# Patient Record
Sex: Female | Born: 1935 | Race: Black or African American | Hispanic: No | Smoking: Former smoker
Health system: Southern US, Community
[De-identification: ages and names within clinical notes are randomized; demographics above are authoritative.]

## PROBLEM LIST (undated history)

## (undated) DIAGNOSIS — N189 Chronic kidney disease, unspecified: Secondary | ICD-10-CM

## (undated) DIAGNOSIS — E785 Hyperlipidemia, unspecified: Secondary | ICD-10-CM

## (undated) DIAGNOSIS — I38 Endocarditis, valve unspecified: Secondary | ICD-10-CM

## (undated) DIAGNOSIS — S62109A Fracture of unspecified carpal bone, unspecified wrist, initial encounter for closed fracture: Secondary | ICD-10-CM

## (undated) DIAGNOSIS — M1990 Unspecified osteoarthritis, unspecified site: Secondary | ICD-10-CM

## (undated) DIAGNOSIS — M109 Gout, unspecified: Secondary | ICD-10-CM

## (undated) DIAGNOSIS — D509 Iron deficiency anemia, unspecified: Secondary | ICD-10-CM

## (undated) DIAGNOSIS — I10 Essential (primary) hypertension: Secondary | ICD-10-CM

## (undated) DIAGNOSIS — I639 Cerebral infarction, unspecified: Secondary | ICD-10-CM

## (undated) DIAGNOSIS — K5790 Diverticulosis of intestine, part unspecified, without perforation or abscess without bleeding: Secondary | ICD-10-CM

## (undated) DIAGNOSIS — I5042 Chronic combined systolic (congestive) and diastolic (congestive) heart failure: Secondary | ICD-10-CM

## (undated) DIAGNOSIS — I480 Paroxysmal atrial fibrillation: Secondary | ICD-10-CM

## (undated) DIAGNOSIS — I214 Non-ST elevation (NSTEMI) myocardial infarction: Secondary | ICD-10-CM

## (undated) DIAGNOSIS — M7120 Synovial cyst of popliteal space [Baker], unspecified knee: Secondary | ICD-10-CM

## (undated) HISTORY — DX: Chronic kidney disease, unspecified: N18.9

## (undated) HISTORY — DX: Hyperlipidemia, unspecified: E78.5

## (undated) HISTORY — DX: Synovial cyst of popliteal space (Baker), unspecified knee: M71.20

## (undated) HISTORY — DX: Diverticulosis of intestine, part unspecified, without perforation or abscess without bleeding: K57.90

## (undated) HISTORY — DX: Essential (primary) hypertension: I10

## (undated) HISTORY — DX: Unspecified osteoarthritis, unspecified site: M19.90

## (undated) HISTORY — DX: Endocarditis, valve unspecified: I38

## (undated) HISTORY — DX: Paroxysmal atrial fibrillation: I48.0

## (undated) HISTORY — DX: Gout, unspecified: M10.9

## (undated) HISTORY — DX: Fracture of unspecified carpal bone, unspecified wrist, initial encounter for closed fracture: S62.109A

## (undated) HISTORY — DX: Iron deficiency anemia, unspecified: D50.9

---
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
## 1992-03-28 HISTORY — PX: TOTAL HIP ARTHROPLASTY: SHX124

## 2000-08-08 ENCOUNTER — Other Ambulatory Visit: Admission: RE | Admit: 2000-08-08 | Discharge: 2000-08-08 | Payer: Self-pay | Admitting: Family Medicine

## 2001-05-26 ENCOUNTER — Encounter: Payer: Self-pay | Admitting: Ophthalmology

## 2001-05-26 ENCOUNTER — Emergency Department (HOSPITAL_COMMUNITY): Admission: EM | Admit: 2001-05-26 | Discharge: 2001-05-26 | Payer: Self-pay | Admitting: Internal Medicine

## 2001-07-19 ENCOUNTER — Encounter (HOSPITAL_COMMUNITY): Admission: RE | Admit: 2001-07-19 | Discharge: 2001-08-18 | Payer: Self-pay | Admitting: Orthopedic Surgery

## 2001-09-10 ENCOUNTER — Inpatient Hospital Stay (HOSPITAL_COMMUNITY): Admission: EM | Admit: 2001-09-10 | Discharge: 2001-09-14 | Payer: Self-pay | Admitting: Emergency Medicine

## 2002-03-08 ENCOUNTER — Ambulatory Visit (HOSPITAL_COMMUNITY): Admission: RE | Admit: 2002-03-08 | Discharge: 2002-03-08 | Payer: Self-pay | Admitting: Family Medicine

## 2002-03-08 ENCOUNTER — Encounter: Payer: Self-pay | Admitting: Family Medicine

## 2002-12-16 ENCOUNTER — Ambulatory Visit (HOSPITAL_COMMUNITY): Admission: RE | Admit: 2002-12-16 | Discharge: 2002-12-16 | Payer: Self-pay | Admitting: Family Medicine

## 2002-12-16 ENCOUNTER — Encounter: Payer: Self-pay | Admitting: Family Medicine

## 2003-01-17 ENCOUNTER — Ambulatory Visit (HOSPITAL_COMMUNITY): Admission: RE | Admit: 2003-01-17 | Discharge: 2003-01-17 | Payer: Self-pay | Admitting: Family Medicine

## 2004-02-05 ENCOUNTER — Ambulatory Visit: Payer: Self-pay | Admitting: Family Medicine

## 2004-03-16 ENCOUNTER — Ambulatory Visit: Payer: Self-pay | Admitting: Family Medicine

## 2004-03-30 ENCOUNTER — Ambulatory Visit: Admission: RE | Admit: 2004-03-30 | Discharge: 2004-03-30 | Payer: Self-pay | Admitting: Family Medicine

## 2004-05-12 ENCOUNTER — Ambulatory Visit: Payer: Self-pay | Admitting: Family Medicine

## 2004-05-18 ENCOUNTER — Ambulatory Visit (HOSPITAL_COMMUNITY): Admission: RE | Admit: 2004-05-18 | Discharge: 2004-05-18 | Payer: Self-pay | Admitting: Family Medicine

## 2004-08-16 ENCOUNTER — Ambulatory Visit: Payer: Self-pay | Admitting: Family Medicine

## 2004-09-13 ENCOUNTER — Ambulatory Visit: Payer: Self-pay | Admitting: Family Medicine

## 2004-12-16 ENCOUNTER — Ambulatory Visit: Payer: Self-pay | Admitting: Family Medicine

## 2005-02-08 ENCOUNTER — Ambulatory Visit: Payer: Self-pay | Admitting: Family Medicine

## 2005-03-16 ENCOUNTER — Ambulatory Visit: Payer: Self-pay | Admitting: Family Medicine

## 2005-03-22 ENCOUNTER — Ambulatory Visit: Payer: Self-pay | Admitting: Family Medicine

## 2005-05-03 ENCOUNTER — Ambulatory Visit: Payer: Self-pay | Admitting: Family Medicine

## 2005-05-03 ENCOUNTER — Ambulatory Visit (HOSPITAL_COMMUNITY): Admission: RE | Admit: 2005-05-03 | Discharge: 2005-05-03 | Payer: Self-pay | Admitting: Family Medicine

## 2005-05-10 ENCOUNTER — Ambulatory Visit: Payer: Self-pay | Admitting: *Deleted

## 2005-05-11 ENCOUNTER — Ambulatory Visit: Payer: Self-pay | Admitting: *Deleted

## 2005-05-11 ENCOUNTER — Ambulatory Visit (HOSPITAL_COMMUNITY): Admission: RE | Admit: 2005-05-11 | Discharge: 2005-05-11 | Payer: Self-pay | Admitting: *Deleted

## 2005-05-17 ENCOUNTER — Ambulatory Visit: Payer: Self-pay | Admitting: *Deleted

## 2005-05-20 ENCOUNTER — Ambulatory Visit: Payer: Self-pay | Admitting: Cardiology

## 2005-05-24 ENCOUNTER — Ambulatory Visit: Payer: Self-pay | Admitting: Family Medicine

## 2005-05-24 ENCOUNTER — Ambulatory Visit: Payer: Self-pay | Admitting: *Deleted

## 2005-05-31 ENCOUNTER — Ambulatory Visit: Payer: Self-pay | Admitting: *Deleted

## 2005-06-08 ENCOUNTER — Ambulatory Visit: Payer: Self-pay | Admitting: Cardiology

## 2005-06-13 ENCOUNTER — Ambulatory Visit: Payer: Self-pay | Admitting: Family Medicine

## 2005-07-26 ENCOUNTER — Ambulatory Visit: Payer: Self-pay | Admitting: Family Medicine

## 2006-01-03 ENCOUNTER — Ambulatory Visit: Payer: Self-pay | Admitting: Family Medicine

## 2006-01-06 ENCOUNTER — Encounter (HOSPITAL_COMMUNITY): Admission: RE | Admit: 2006-01-06 | Discharge: 2006-02-05 | Payer: Self-pay | Admitting: Family Medicine

## 2006-01-09 ENCOUNTER — Ambulatory Visit (HOSPITAL_COMMUNITY): Payer: Self-pay | Admitting: Family Medicine

## 2006-01-13 ENCOUNTER — Ambulatory Visit: Payer: Self-pay | Admitting: Cardiology

## 2006-01-16 ENCOUNTER — Ambulatory Visit: Payer: Self-pay | Admitting: Family Medicine

## 2006-01-25 ENCOUNTER — Ambulatory Visit: Payer: Self-pay | Admitting: Family Medicine

## 2006-01-31 ENCOUNTER — Ambulatory Visit: Payer: Self-pay | Admitting: Gastroenterology

## 2006-02-06 ENCOUNTER — Ambulatory Visit: Payer: Self-pay | Admitting: Gastroenterology

## 2006-02-06 ENCOUNTER — Ambulatory Visit (HOSPITAL_COMMUNITY): Admission: RE | Admit: 2006-02-06 | Discharge: 2006-02-06 | Payer: Self-pay | Admitting: Gastroenterology

## 2006-02-06 ENCOUNTER — Encounter (INDEPENDENT_AMBULATORY_CARE_PROVIDER_SITE_OTHER): Payer: Self-pay | Admitting: Specialist

## 2006-02-08 ENCOUNTER — Ambulatory Visit: Payer: Self-pay | Admitting: Gastroenterology

## 2006-02-08 ENCOUNTER — Ambulatory Visit (HOSPITAL_COMMUNITY): Admission: RE | Admit: 2006-02-08 | Discharge: 2006-02-08 | Payer: Self-pay | Admitting: Gastroenterology

## 2006-02-20 ENCOUNTER — Ambulatory Visit: Payer: Self-pay | Admitting: Family Medicine

## 2006-02-23 ENCOUNTER — Ambulatory Visit (HOSPITAL_COMMUNITY): Admission: RE | Admit: 2006-02-23 | Discharge: 2006-02-23 | Payer: Self-pay | Admitting: Family Medicine

## 2006-03-16 ENCOUNTER — Ambulatory Visit: Payer: Self-pay | Admitting: Gastroenterology

## 2006-03-28 DIAGNOSIS — S62109A Fracture of unspecified carpal bone, unspecified wrist, initial encounter for closed fracture: Secondary | ICD-10-CM

## 2006-03-28 HISTORY — DX: Fracture of unspecified carpal bone, unspecified wrist, initial encounter for closed fracture: S62.109A

## 2006-04-07 ENCOUNTER — Ambulatory Visit: Payer: Self-pay | Admitting: Family Medicine

## 2006-05-08 ENCOUNTER — Ambulatory Visit: Payer: Self-pay | Admitting: Gastroenterology

## 2006-05-10 ENCOUNTER — Ambulatory Visit: Payer: Self-pay | Admitting: Cardiology

## 2006-05-23 ENCOUNTER — Other Ambulatory Visit: Admission: RE | Admit: 2006-05-23 | Discharge: 2006-05-23 | Payer: Self-pay | Admitting: Family Medicine

## 2006-05-23 ENCOUNTER — Ambulatory Visit: Payer: Self-pay | Admitting: Family Medicine

## 2006-05-23 LAB — CONVERTED CEMR LAB
ALT: 8 units/L (ref 0–35)
AST: 15 units/L (ref 0–37)
Alkaline Phosphatase: 59 units/L (ref 39–117)
BUN: 21 mg/dL (ref 6–23)
Basophils Absolute: 0.1 10*3/uL (ref 0.0–0.1)
Bilirubin, Direct: 0.1 mg/dL (ref 0.0–0.3)
CO2: 23 meq/L (ref 19–32)
Eosinophils Absolute: 0.1 10*3/uL (ref 0.0–0.7)
Eosinophils Relative: 1 % (ref 0–5)
HCT: 33.6 % — ABNORMAL LOW (ref 36.0–46.0)
HDL: 57 mg/dL (ref >39)
Hemoglobin: 10.8 g/dL — ABNORMAL LOW (ref 12.0–15.0)
Indirect Bilirubin: 0.6 mg/dL (ref 0.0–0.9)
LDL Cholesterol: 76 mg/dL (ref 0–99)
Neutro Abs: 3.1 10*3/uL (ref 1.7–7.7)
Pap Smear: NORMAL
Pap Smear: NORMAL
Platelets: 230 10*3/uL (ref 150–400)
RBC: 3.49 M/uL — ABNORMAL LOW (ref 3.87–5.11)
Total Protein: 7.2 g/dL (ref 6.0–8.3)
Triglycerides: 101 mg/dL (ref <150)
VLDL: 20 mg/dL (ref 0–40)
WBC: 5.9 10*3/uL (ref 4.0–10.5)

## 2006-07-18 ENCOUNTER — Ambulatory Visit: Payer: Self-pay | Admitting: Cardiology

## 2006-07-20 ENCOUNTER — Ambulatory Visit (HOSPITAL_COMMUNITY): Admission: RE | Admit: 2006-07-20 | Discharge: 2006-07-20 | Payer: Self-pay | Admitting: Cardiology

## 2006-07-28 ENCOUNTER — Ambulatory Visit: Payer: Self-pay | Admitting: Family Medicine

## 2006-08-23 ENCOUNTER — Ambulatory Visit: Payer: Self-pay | Admitting: Family Medicine

## 2006-08-25 ENCOUNTER — Ambulatory Visit (HOSPITAL_COMMUNITY): Admission: RE | Admit: 2006-08-25 | Discharge: 2006-08-25 | Payer: Self-pay | Admitting: Family Medicine

## 2006-09-20 ENCOUNTER — Ambulatory Visit: Payer: Self-pay | Admitting: Gastroenterology

## 2006-10-04 ENCOUNTER — Ambulatory Visit: Payer: Self-pay | Admitting: Family Medicine

## 2006-12-15 ENCOUNTER — Ambulatory Visit: Payer: Self-pay | Admitting: Family Medicine

## 2006-12-20 ENCOUNTER — Ambulatory Visit: Payer: Self-pay | Admitting: Family Medicine

## 2006-12-21 ENCOUNTER — Encounter: Payer: Self-pay | Admitting: Family Medicine

## 2006-12-21 LAB — CONVERTED CEMR LAB
ALT: 11 units/L (ref 0–35)
Basophils Absolute: 0.1 10*3/uL (ref 0.0–0.1)
Calcium: 9.8 mg/dL (ref 8.4–10.5)
Chloride: 104 meq/L (ref 96–112)
Cholesterol: 158 mg/dL (ref 0–200)
Eosinophils Absolute: 0 10*3/uL (ref 0.0–0.7)
Eosinophils Relative: 1 % (ref 0–5)
HCT: 33.4 % — ABNORMAL LOW (ref 36.0–46.0)
HDL: 64 mg/dL (ref >39)
LDL Cholesterol: 79 mg/dL (ref 0–99)
Lymphs Abs: 1.7 10*3/uL (ref 0.7–3.3)
MCV: 89.8 fL (ref 78.0–100.0)
Monocytes Absolute: 0.3 10*3/uL (ref 0.2–0.7)
Monocytes Relative: 5 % (ref 3–11)
Neutro Abs: 3.7 10*3/uL (ref 1.7–7.7)
Sodium: 138 meq/L (ref 135–145)
TSH: 0.568 microintl units/mL (ref 0.350–5.50)
WBC: 5.8 10*3/uL (ref 4.0–10.5)

## 2006-12-28 ENCOUNTER — Ambulatory Visit: Payer: Self-pay | Admitting: Family Medicine

## 2007-01-12 ENCOUNTER — Ambulatory Visit: Payer: Self-pay | Admitting: Cardiology

## 2007-02-05 ENCOUNTER — Ambulatory Visit: Payer: Self-pay | Admitting: Family Medicine

## 2007-03-27 ENCOUNTER — Ambulatory Visit: Payer: Self-pay | Admitting: Family Medicine

## 2007-04-06 ENCOUNTER — Encounter: Payer: Self-pay | Admitting: Family Medicine

## 2007-04-06 DIAGNOSIS — D509 Iron deficiency anemia, unspecified: Secondary | ICD-10-CM | POA: Insufficient documentation

## 2007-05-24 ENCOUNTER — Ambulatory Visit: Payer: Self-pay | Admitting: Family Medicine

## 2007-07-18 ENCOUNTER — Encounter: Payer: Self-pay | Admitting: Family Medicine

## 2007-07-19 ENCOUNTER — Ambulatory Visit: Payer: Self-pay | Admitting: Family Medicine

## 2007-07-20 ENCOUNTER — Encounter: Payer: Self-pay | Admitting: Family Medicine

## 2007-07-23 ENCOUNTER — Ambulatory Visit: Payer: Self-pay | Admitting: Family Medicine

## 2007-08-22 ENCOUNTER — Ambulatory Visit (HOSPITAL_COMMUNITY): Admission: RE | Admit: 2007-08-22 | Discharge: 2007-08-22 | Payer: Self-pay | Admitting: Family Medicine

## 2007-09-18 ENCOUNTER — Ambulatory Visit: Payer: Self-pay | Admitting: Family Medicine

## 2007-10-15 ENCOUNTER — Ambulatory Visit: Payer: Self-pay | Admitting: Family Medicine

## 2007-11-09 ENCOUNTER — Encounter: Payer: Self-pay | Admitting: Family Medicine

## 2007-11-09 ENCOUNTER — Ambulatory Visit: Payer: Self-pay | Admitting: Cardiology

## 2007-11-20 ENCOUNTER — Ambulatory Visit: Payer: Self-pay | Admitting: Family Medicine

## 2007-11-20 DIAGNOSIS — M129 Arthropathy, unspecified: Secondary | ICD-10-CM

## 2007-11-23 ENCOUNTER — Ambulatory Visit: Payer: Self-pay | Admitting: Family Medicine

## 2007-11-28 ENCOUNTER — Encounter: Payer: Self-pay | Admitting: Family Medicine

## 2007-12-24 ENCOUNTER — Encounter: Payer: Self-pay | Admitting: Family Medicine

## 2007-12-27 ENCOUNTER — Ambulatory Visit: Payer: Self-pay | Admitting: Family Medicine

## 2008-01-08 ENCOUNTER — Encounter: Payer: Self-pay | Admitting: Family Medicine

## 2008-01-10 ENCOUNTER — Encounter: Payer: Self-pay | Admitting: Family Medicine

## 2008-01-10 LAB — CONVERTED CEMR LAB
ALT: 11 units/L (ref 0–35)
AST: 16 units/L (ref 0–37)
Albumin: 4.2 g/dL (ref 3.5–5.2)
Calcium: 9.9 mg/dL (ref 8.4–10.5)
Chloride: 104 meq/L (ref 96–112)
Cholesterol: 177 mg/dL (ref 0–200)
LDL Cholesterol: 97 mg/dL (ref 0–99)
Potassium: 5.1 meq/L (ref 3.5–5.3)
Sodium: 141 meq/L (ref 135–145)
Total CHOL/HDL Ratio: 2.7
Total Protein: 7.8 g/dL (ref 6.0–8.3)
Triglycerides: 75 mg/dL (ref <150)

## 2008-01-15 ENCOUNTER — Encounter: Payer: Self-pay | Admitting: Family Medicine

## 2008-01-16 ENCOUNTER — Ambulatory Visit: Payer: Self-pay | Admitting: Family Medicine

## 2008-01-29 ENCOUNTER — Ambulatory Visit: Payer: Self-pay | Admitting: Family Medicine

## 2008-01-29 DIAGNOSIS — S0990XA Unspecified injury of head, initial encounter: Secondary | ICD-10-CM | POA: Insufficient documentation

## 2008-01-29 DIAGNOSIS — M25539 Pain in unspecified wrist: Secondary | ICD-10-CM

## 2008-01-30 ENCOUNTER — Ambulatory Visit (HOSPITAL_COMMUNITY): Admission: RE | Admit: 2008-01-30 | Discharge: 2008-01-30 | Payer: Self-pay | Admitting: Family Medicine

## 2008-02-07 ENCOUNTER — Encounter: Payer: Self-pay | Admitting: Family Medicine

## 2008-03-06 ENCOUNTER — Encounter: Payer: Self-pay | Admitting: Family Medicine

## 2008-03-10 ENCOUNTER — Telehealth: Payer: Self-pay | Admitting: Family Medicine

## 2008-03-13 ENCOUNTER — Encounter: Payer: Self-pay | Admitting: Family Medicine

## 2008-03-24 ENCOUNTER — Encounter: Payer: Self-pay | Admitting: Family Medicine

## 2008-03-31 ENCOUNTER — Encounter: Payer: Self-pay | Admitting: Family Medicine

## 2008-04-14 ENCOUNTER — Ambulatory Visit: Payer: Self-pay | Admitting: Family Medicine

## 2008-04-16 ENCOUNTER — Encounter: Payer: Self-pay | Admitting: Family Medicine

## 2008-04-25 ENCOUNTER — Encounter: Payer: Self-pay | Admitting: Family Medicine

## 2008-05-05 ENCOUNTER — Encounter: Payer: Self-pay | Admitting: Family Medicine

## 2008-05-07 ENCOUNTER — Encounter: Payer: Self-pay | Admitting: Family Medicine

## 2008-05-21 ENCOUNTER — Encounter: Payer: Self-pay | Admitting: Family Medicine

## 2008-05-23 ENCOUNTER — Ambulatory Visit: Payer: Self-pay | Admitting: Family Medicine

## 2008-05-23 DIAGNOSIS — R5381 Other malaise: Secondary | ICD-10-CM | POA: Insufficient documentation

## 2008-05-23 DIAGNOSIS — R5383 Other fatigue: Secondary | ICD-10-CM

## 2008-05-28 ENCOUNTER — Encounter: Payer: Self-pay | Admitting: Family Medicine

## 2008-05-30 ENCOUNTER — Encounter: Payer: Self-pay | Admitting: Family Medicine

## 2008-05-30 LAB — CONVERTED CEMR LAB
ALT: 11 units/L (ref 0–35)
AST: 17 units/L (ref 0–37)
Albumin: 3.9 g/dL (ref 3.5–5.2)
Eosinophils Relative: 1 % (ref 0–5)
HCT: 33 % — ABNORMAL LOW (ref 36.0–46.0)
Hemoglobin: 10.1 g/dL — ABNORMAL LOW (ref 12.0–15.0)
Indirect Bilirubin: 0.5 mg/dL (ref 0.0–0.9)
Lymphocytes Relative: 24 % (ref 12–46)
MCHC: 30.6 g/dL (ref 30.0–36.0)
Monocytes Relative: 5 % (ref 3–12)
Neutro Abs: 4.5 10*3/uL (ref 1.7–7.7)
Neutrophils Relative %: 68 % (ref 43–77)
RBC: 3.5 M/uL — ABNORMAL LOW (ref 3.87–5.11)
RDW: 13.1 % (ref 11.5–15.5)
Total Bilirubin: 0.6 mg/dL (ref 0.3–1.2)
Total CHOL/HDL Ratio: 2.6
Triglycerides: 71 mg/dL (ref <150)
VLDL: 14 mg/dL (ref 0–40)
WBC: 6.6 10*3/uL (ref 4.0–10.5)

## 2008-06-02 ENCOUNTER — Telehealth: Payer: Self-pay | Admitting: Family Medicine

## 2008-06-03 ENCOUNTER — Encounter: Payer: Self-pay | Admitting: Family Medicine

## 2008-09-15 ENCOUNTER — Encounter: Payer: Self-pay | Admitting: Family Medicine

## 2008-09-16 ENCOUNTER — Encounter: Payer: Self-pay | Admitting: Family Medicine

## 2008-10-16 ENCOUNTER — Ambulatory Visit: Payer: Self-pay | Admitting: Family Medicine

## 2008-10-16 DIAGNOSIS — M25569 Pain in unspecified knee: Secondary | ICD-10-CM

## 2008-10-22 ENCOUNTER — Ambulatory Visit (HOSPITAL_COMMUNITY): Admission: RE | Admit: 2008-10-22 | Discharge: 2008-10-22 | Payer: Self-pay | Admitting: Family Medicine

## 2008-10-27 ENCOUNTER — Encounter: Payer: Self-pay | Admitting: Family Medicine

## 2008-10-28 ENCOUNTER — Encounter: Payer: Self-pay | Admitting: Family Medicine

## 2008-11-17 ENCOUNTER — Encounter: Payer: Self-pay | Admitting: Family Medicine

## 2008-11-28 ENCOUNTER — Encounter: Payer: Self-pay | Admitting: Family Medicine

## 2008-12-12 ENCOUNTER — Encounter: Payer: Self-pay | Admitting: Family Medicine

## 2008-12-29 ENCOUNTER — Encounter: Payer: Self-pay | Admitting: Family Medicine

## 2008-12-31 ENCOUNTER — Ambulatory Visit: Payer: Self-pay | Admitting: Family Medicine

## 2009-01-07 ENCOUNTER — Encounter: Payer: Self-pay | Admitting: Family Medicine

## 2009-01-12 LAB — CONVERTED CEMR LAB
BUN: 26 mg/dL — ABNORMAL HIGH (ref 6–23)
Basophils Absolute: 0.1 10*3/uL (ref 0.0–0.1)
Calcium: 10.2 mg/dL (ref 8.4–10.5)
Chloride: 104 meq/L (ref 96–112)
Cholesterol: 186 mg/dL (ref 0–200)
Creatinine, Ser: 1.41 mg/dL — ABNORMAL HIGH (ref 0.40–1.20)
Eosinophils Absolute: 0.1 10*3/uL (ref 0.0–0.7)
Eosinophils Relative: 2 % (ref 0–5)
Glucose, Bld: 85 mg/dL (ref 70–99)
HCT: 32.1 % — ABNORMAL LOW (ref 36.0–46.0)
Indirect Bilirubin: 0.4 mg/dL (ref 0.0–0.9)
LDL Cholesterol: 105 mg/dL — ABNORMAL HIGH (ref 0–99)
Lymphocytes Relative: 30 % (ref 12–46)
MCHC: 31.2 g/dL (ref 30.0–36.0)
Monocytes Absolute: 0.5 10*3/uL (ref 0.1–1.0)
Monocytes Relative: 8 % (ref 3–12)
Neutrophils Relative %: 59 % (ref 43–77)
Platelets: 204 10*3/uL (ref 150–400)
RDW: 12.8 % (ref 11.5–15.5)
Total CHOL/HDL Ratio: 2.8
Triglycerides: 69 mg/dL (ref <150)
WBC: 5.9 10*3/uL (ref 4.0–10.5)

## 2009-01-14 ENCOUNTER — Ambulatory Visit: Payer: Self-pay | Admitting: Family Medicine

## 2009-01-14 DIAGNOSIS — L089 Local infection of the skin and subcutaneous tissue, unspecified: Secondary | ICD-10-CM | POA: Insufficient documentation

## 2009-01-14 DIAGNOSIS — M79609 Pain in unspecified limb: Secondary | ICD-10-CM

## 2009-01-15 LAB — CONVERTED CEMR LAB: Uric Acid, Serum: 9.3 mg/dL — ABNORMAL HIGH (ref 2.4–7.0)

## 2009-01-20 ENCOUNTER — Encounter (INDEPENDENT_AMBULATORY_CARE_PROVIDER_SITE_OTHER): Payer: Self-pay | Admitting: *Deleted

## 2009-01-20 ENCOUNTER — Ambulatory Visit: Payer: Self-pay | Admitting: Cardiology

## 2009-01-28 ENCOUNTER — Encounter (INDEPENDENT_AMBULATORY_CARE_PROVIDER_SITE_OTHER): Payer: Self-pay | Admitting: *Deleted

## 2009-01-28 LAB — CONVERTED CEMR LAB
OCCULT 1: NEGATIVE
OCCULT 2: NEGATIVE
OCCULT 3: NEGATIVE

## 2009-01-30 ENCOUNTER — Encounter (INDEPENDENT_AMBULATORY_CARE_PROVIDER_SITE_OTHER): Payer: Self-pay | Admitting: *Deleted

## 2009-02-02 LAB — CONVERTED CEMR LAB
OCCULT 1: NEGATIVE
OCCULT 2: NEGATIVE

## 2009-02-04 ENCOUNTER — Ambulatory Visit: Payer: Self-pay | Admitting: Family Medicine

## 2009-03-13 ENCOUNTER — Ambulatory Visit: Payer: Self-pay | Admitting: Family Medicine

## 2009-04-20 ENCOUNTER — Ambulatory Visit: Payer: Self-pay | Admitting: Family Medicine

## 2009-04-20 LAB — CONVERTED CEMR LAB
Creatinine, Ser: 1.45 mg/dL — ABNORMAL HIGH (ref 0.40–1.20)
Sodium: 137 meq/L (ref 135–145)

## 2009-05-11 ENCOUNTER — Ambulatory Visit: Payer: Self-pay | Admitting: Family Medicine

## 2009-05-18 ENCOUNTER — Encounter: Payer: Self-pay | Admitting: Family Medicine

## 2009-06-19 ENCOUNTER — Encounter: Payer: Self-pay | Admitting: Family Medicine

## 2009-07-07 ENCOUNTER — Encounter: Payer: Self-pay | Admitting: Family Medicine

## 2009-07-21 ENCOUNTER — Ambulatory Visit: Payer: Self-pay | Admitting: Family Medicine

## 2009-07-22 LAB — CONVERTED CEMR LAB
CO2: 20 meq/L (ref 19–32)
Calcium: 9.8 mg/dL (ref 8.4–10.5)
Chloride: 110 meq/L (ref 96–112)
Eosinophils Absolute: 0.1 10*3/uL (ref 0.0–0.7)
Glucose, Bld: 87 mg/dL (ref 70–99)
Hemoglobin: 9.2 g/dL — ABNORMAL LOW (ref 12.0–15.0)
MCV: 97.4 fL (ref 78.0–100.0)
Monocytes Absolute: 0.4 10*3/uL (ref 0.1–1.0)
Monocytes Relative: 6 % (ref 3–12)
Neutro Abs: 4.8 10*3/uL (ref 1.7–7.7)
Neutrophils Relative %: 72 % (ref 43–77)
Potassium: 5.3 meq/L (ref 3.5–5.3)
Uric Acid, Serum: 6.7 mg/dL (ref 2.4–7.0)
Vit D, 25-Hydroxy: 38 ng/mL (ref 30–89)

## 2009-08-10 ENCOUNTER — Telehealth: Payer: Self-pay | Admitting: Family Medicine

## 2009-08-19 ENCOUNTER — Ambulatory Visit: Payer: Self-pay | Admitting: Family Medicine

## 2009-08-19 DIAGNOSIS — N3000 Acute cystitis without hematuria: Secondary | ICD-10-CM | POA: Insufficient documentation

## 2009-08-19 LAB — CONVERTED CEMR LAB
Bilirubin Urine: NEGATIVE
Glucose, Urine, Semiquant: NEGATIVE
Ketones, urine, test strip: NEGATIVE
Protein, U semiquant: 30
Specific Gravity, Urine: 1.015
Urobilinogen, UA: 0.2
pH: 5

## 2009-08-20 ENCOUNTER — Encounter: Payer: Self-pay | Admitting: Family Medicine

## 2009-09-07 ENCOUNTER — Ambulatory Visit: Payer: Self-pay | Admitting: Family Medicine

## 2009-09-23 ENCOUNTER — Emergency Department (HOSPITAL_COMMUNITY): Admission: EM | Admit: 2009-09-23 | Discharge: 2009-09-23 | Payer: Self-pay | Admitting: Emergency Medicine

## 2009-09-25 ENCOUNTER — Ambulatory Visit: Payer: Self-pay | Admitting: Family Medicine

## 2009-10-16 ENCOUNTER — Ambulatory Visit: Payer: Self-pay | Admitting: Family Medicine

## 2009-10-19 ENCOUNTER — Encounter: Payer: Self-pay | Admitting: Family Medicine

## 2009-11-03 ENCOUNTER — Encounter: Payer: Self-pay | Admitting: Family Medicine

## 2009-11-05 ENCOUNTER — Telehealth: Payer: Self-pay | Admitting: Family Medicine

## 2009-11-06 ENCOUNTER — Telehealth: Payer: Self-pay | Admitting: Cardiology

## 2009-11-06 ENCOUNTER — Ambulatory Visit: Payer: Self-pay | Admitting: Family Medicine

## 2009-11-06 DIAGNOSIS — M810 Age-related osteoporosis without current pathological fracture: Secondary | ICD-10-CM | POA: Insufficient documentation

## 2009-11-09 ENCOUNTER — Encounter: Payer: Self-pay | Admitting: Family Medicine

## 2009-11-09 LAB — CONVERTED CEMR LAB
AST: 17 units/L (ref 0–37)
Albumin: 4.3 g/dL (ref 3.5–5.2)
BUN: 18 mg/dL (ref 6–23)
Bilirubin Urine: NEGATIVE
Casts: NONE SEEN /lpf
Crystals: NONE SEEN
HCT: 34.3 % — ABNORMAL LOW (ref 36.0–46.0)
Lymphs Abs: 1.9 10*3/uL (ref 0.7–4.0)
MCV: 101.8 fL — ABNORMAL HIGH (ref 78.0–100.0)
Monocytes Absolute: 0.4 10*3/uL (ref 0.1–1.0)
Neutro Abs: 3.6 10*3/uL (ref 1.7–7.7)
Protein, ur: NEGATIVE mg/dL
Sodium: 138 meq/L (ref 135–145)
Specific Gravity, Urine: 1.017 (ref 1.005–1.030)
Total Bilirubin: 0.5 mg/dL (ref 0.3–1.2)
Triglycerides: 110 mg/dL (ref <150)
Urine Glucose: NEGATIVE mg/dL
Urobilinogen, UA: 0.2 (ref 0.0–1.0)
Vit D, 25-Hydroxy: 35 ng/mL (ref 30–89)

## 2009-11-11 ENCOUNTER — Telehealth: Payer: Self-pay | Admitting: Family Medicine

## 2009-12-02 ENCOUNTER — Telehealth: Payer: Self-pay | Admitting: Family Medicine

## 2009-12-07 ENCOUNTER — Telehealth: Payer: Self-pay | Admitting: Family Medicine

## 2009-12-29 ENCOUNTER — Encounter: Payer: Self-pay | Admitting: Family Medicine

## 2010-01-08 ENCOUNTER — Ambulatory Visit: Payer: Self-pay | Admitting: Family Medicine

## 2010-01-11 ENCOUNTER — Encounter: Payer: Self-pay | Admitting: Family Medicine

## 2010-01-22 ENCOUNTER — Encounter: Payer: Self-pay | Admitting: Adult Health

## 2010-01-22 ENCOUNTER — Ambulatory Visit: Payer: Self-pay | Admitting: Cardiology

## 2010-01-25 ENCOUNTER — Telehealth: Payer: Self-pay | Admitting: Family Medicine

## 2010-01-28 ENCOUNTER — Encounter: Payer: Self-pay | Admitting: Family Medicine

## 2010-01-29 ENCOUNTER — Ambulatory Visit: Payer: Self-pay | Admitting: Cardiology

## 2010-01-29 ENCOUNTER — Encounter: Payer: Self-pay | Admitting: Cardiology

## 2010-01-29 ENCOUNTER — Ambulatory Visit (HOSPITAL_COMMUNITY): Admission: RE | Admit: 2010-01-29 | Discharge: 2010-01-29 | Payer: Self-pay | Admitting: Cardiology

## 2010-02-05 ENCOUNTER — Encounter (INDEPENDENT_AMBULATORY_CARE_PROVIDER_SITE_OTHER): Payer: Self-pay | Admitting: *Deleted

## 2010-02-05 ENCOUNTER — Telehealth (INDEPENDENT_AMBULATORY_CARE_PROVIDER_SITE_OTHER): Payer: Self-pay | Admitting: *Deleted

## 2010-02-08 ENCOUNTER — Ambulatory Visit (HOSPITAL_COMMUNITY): Admission: RE | Admit: 2010-02-08 | Discharge: 2010-02-08 | Payer: Self-pay | Admitting: Oral Surgery

## 2010-02-23 ENCOUNTER — Ambulatory Visit: Payer: Self-pay | Admitting: Cardiology

## 2010-03-09 ENCOUNTER — Ambulatory Visit: Payer: Self-pay | Admitting: Family Medicine

## 2010-03-26 LAB — CONVERTED CEMR LAB
ALT: <8 units/L (ref 0–35)
AST: 14 units/L (ref 0–37)
Alkaline Phosphatase: 50 units/L (ref 39–117)
BUN: 31 mg/dL — ABNORMAL HIGH (ref 6–23)
Bilirubin, Direct: 0.1 mg/dL (ref 0.0–0.3)
Calcium: 10.5 mg/dL (ref 8.4–10.5)
Chloride: 103 meq/L (ref 96–112)
Hemoglobin: 10 g/dL — ABNORMAL LOW (ref 12.0–15.0)
Indirect Bilirubin: 0.3 mg/dL (ref 0.0–0.9)
Lymphs Abs: 1.4 10*3/uL (ref 0.7–4.0)
MCHC: 33.1 g/dL (ref 30.0–36.0)
MCV: 96.5 fL (ref 78.0–100.0)
Monocytes Relative: 6 % (ref 3–12)
Neutrophils Relative %: 58 % (ref 43–77)
RBC: 3.13 M/uL — ABNORMAL LOW (ref 3.87–5.11)
RDW: 13.5 % (ref 11.5–15.5)
Sodium: 140 meq/L (ref 135–145)
TSH: 0.699 microintl units/mL (ref 0.350–4.500)
Total Bilirubin: 0.4 mg/dL (ref 0.3–1.2)
Total Protein: 7 g/dL (ref 6.0–8.3)
Triglycerides: 76 mg/dL (ref <150)
WBC: 4.3 10*3/uL (ref 4.0–10.5)

## 2010-04-19 ENCOUNTER — Encounter: Payer: Self-pay | Admitting: Family Medicine

## 2010-04-21 ENCOUNTER — Ambulatory Visit
Admission: RE | Admit: 2010-04-21 | Discharge: 2010-04-21 | Payer: Self-pay | Source: Home / Self Care | Attending: Family Medicine | Admitting: Family Medicine

## 2010-04-27 NOTE — Assessment & Plan Note (Signed)
Summary: F1M  Medications Added TRAMADOL HCL 50 MG  TABS (TRAMADOL HCL) TAKES AS NEEDED METOPROLOL TARTRATE 50 MG TABS (METOPROLOL TARTRATE) take 1 1/2 in am and 1 in pm      Allergies Added: NKDA  Visit Type:  Follow-up Primary Provider:  Dr. [name]  CC:  NO CARDIOLOGY COMPLAINTS.  History of Present Illness: Judy Grant is a pleasant 15 AAF we are following for continued assessment and treatment of hypertension and mod-severe AoV stenosis.  She is asymptomatic, but has not had echo completed in 4 years.  I reassessed her AoV with new echo and she is here for results.  She continues active and without complaint.  Current Medications (verified): 1)  Flonase 50 Mcg/act  Susp (Fluticasone Propionate) .Marland Kitchen.. 1 Spray Once Daily 2)  Simvastatin 20 Mg Tabs (Simvastatin) .... Take 1 Tab By Mouth At Bedtime 3)  Furosemide 20 Mg  Tabs (Furosemide) .Marland Kitchen.. 1 By Mouth Once Daily 4)  Tylenol Arthritis Pain 650 Mg  Tbcr (Acetaminophen) .... As Needed 5)  Tramadol Hcl 50 Mg  Tabs (Tramadol Hcl) .... Takes As Needed 6)  Alendronate Sodium 70 Mg  Tabs (Alendronate Sodium) .Marland Kitchen.. 1 By Mouth Every Week 7)  Klor-Con 10 10 Meq Cr-Tabs (Potassium Chloride) .... One Tab By Mouth Once Daily, Two Tabs On Mon, Wed, Fri 8)  Allopurinol 100 Mg Tabs (Allopurinol) .... Take 1 Tablet By Mouth Once A Day 9)  Diovan 80 Mg Tabs (Valsartan) .... Take 1 Tablet By Mouth Once A Day 10)  Metoprolol Tartrate 50 Mg Tabs (Metoprolol Tartrate) .... Take 1 1/2 in Am and 1 in Pm 11)  Oscal 500/200 D-3 500-200 Mg-Unit Tabs (Calcium-Vitamin D) .... Take 1 Tablet By Mouth Three Times A Day 12)  Colace 100 Mg Caps (Docusate Sodium) .... 3 Capsules At Bedtime 13)  Benefiber  Powd (Wheat Dextrin) .... I Teaspoon Qam  Allergies (verified): No Known Drug Allergies  Comments:  Nurse/Medical Assistant: patient brought all meds stated she takes just like bottle states all except tramadol she takes just as needed  Review of  Systems       All other systems have been reviewed and are negative unless stated above.   Vital Signs:  Patient profile:   77 year old female Weight:      139 pounds BMI:     24.71 Pulse rate:   76 / minute BP sitting:   151 / 85  (right arm)  Vitals Entered By: Judy Saa, CNA (February 23, 2010 11:23 AM)  Physical Exam  General:  Well developed, well nourished, in no acute distress. Lungs:  Clear bilaterally to auscultation and percussion. Heart:  Grade  2/6 SEM loudest primary aortic area -->carotids.  No rubs or gallops.Grade  /6 SEM loudest primary aortic area -->carotids.   Abdomen:  Bowel sounds positive; abdomen soft and non-tender without masses, organomegaly, or hernias noted. No hepatosplenomegaly. Pulses:  pulses normal in all 4 extremities Extremities:  No clubbing or cyanosis. Psych:  Normal affect.   Impression & Recommendations:  Problem # 1:  AORTIC STENOSIS, MODERATE (ICD-424.1) Review of echo completed and read by Dr. Dietrich Pates on11/08/2009 demonstrated Valve area of 1.48 by Vmax, Mean gradient of with peak gradient of consistant with mild to moderate stenosis. Her EF was 80%.  Compared to prior echo in 2007 Peak gradient , valve area 1.1, EF of 65%-70% She remains asymptomatic..  Will increase metoprolol to 75mg  in am and 50mg  in pm to assisted  with HR lowering and better BP control to allow for better CO in this setting. Home health has been notified of new medications.  Will follow-up in 6 months with Dr. Dietrich Pates. Her updated medication list for this problem includes:    Furosemide 20 Mg Tabs (Furosemide) .Marland Kitchen... 1 by mouth once daily    Diovan 80 Mg Tabs (Valsartan) .Marland Kitchen... Take 1 tablet by mouth once a day    Metoprolol Tartrate 50 Mg Tabs (Metoprolol tartrate) .Marland Kitchen... Take 1 1/2 in am and 1 in pm  Problem # 2:  HYPERTENSION (ICD-401.9) Continues mildly elevated.  Increased metoprolol should help with this. Her updated medication list for  this problem includes:    Furosemide 20 Mg Tabs (Furosemide) .Marland Kitchen... 1 by mouth once daily    Diovan 80 Mg Tabs (Valsartan) .Marland Kitchen... Take 1 tablet by mouth once a day    Metoprolol Tartrate 50 Mg Tabs (Metoprolol tartrate) .Marland Kitchen... Take 1 1/2 in am and 1 in pm  Patient Instructions: 1)  Your physician recommends that you schedule a follow-up appointment in: 6 months 2)  Your physician has recommended you make the following change in your medication: increase metoprolol to 75mg  in am and 50mg  in pm Prescriptions: METOPROLOL TARTRATE 50 MG TABS (METOPROLOL TARTRATE) take 1 1/2 in am and 1 in pm  #75 x 3   Entered by:   Teressa Lower RN   Authorized by:   Joni Reining, NP   Signed by:   Teressa Lower RN on 02/23/2010   Method used:   Electronically to        [facility] (retail)       [address]       [place], [place]  [address]       Ph: [phone]       Fax: [phone]   RxID:   [id number]

## 2010-04-27 NOTE — Miscellaneous (Signed)
Summary: refill  Clinical Lists Changes  Medications: Rx of SIMVASTATIN 20 MG TABS (SIMVASTATIN) Take 1 tab by mouth at bedtime;  #30 x 3;  Signed;  Entered by: Everitt Amber LPN;  Authorized by: [name] MD;  Method used: Electronically to [facility]*, [address] [place], [place], [place]  [address], Ph: [phone], Fax: [phone]    Prescriptions: SIMVASTATIN 20 MG TABS (SIMVASTATIN) Take 1 tab by mouth at bedtime  #30 x 3   Entered by:   Everitt Amber LPN   Authorized by:   [name] MD   Signed by:   Everitt Amber LPN on 18/84/1660   Method used:   Electronically to        [facility] (retail)       [address]       [place], [place]  [address]       Ph: [phone]       Fax: [phone]   RxID:   [id number]

## 2010-04-27 NOTE — Progress Notes (Signed)
  Phone Note Outgoing Call   Call placed by: dr simpson Summary of Call: Dr. Dietrich Pates, Re Joy Junie Panning, please clarify the issue of "cardiac clearance " for dental extraction. From the note your nurse made, is my assumption that you have cleared her and stated she needs no prophylaxis correct? If not, and you need to see her, pls let me know so I can have an appt scheduled, her annual f/u with you is due next month. Initial call taken by: [name] MD,  December 07, 2009 11:03 AM  Follow-up for Phone Call        direct tele conversation with dr Dietrich Pates, who states pt requires IV sedation and no prophylaxis, as had been previouisly reptd in August, 2011. I will contactthe dentist and advise him to proceed with dental wk under iV sedation Follow-up by: [name] MD,  December 10, 2009 12:17 PM

## 2010-04-27 NOTE — Assessment & Plan Note (Signed)
Summary: F UP   Vital Signs:  Patient profile:   77 year old female Height:      63 inches Weight:      136.25 pounds BMI:     24.22 O2 Sat:      98 % Pulse rate:   73 / minute Pulse rhythm:   irregular Resp:     16 per minute BP sitting:   152 / 80  (left arm) Cuff size:   regular  Vitals Entered By: Everitt Amber LPN (January 08, 2010 9:08 AM) CC: Follow up chronic problems   Primary Care Provider:  Dr. [name]  CC:  Follow up chronic problems.  History of Present Illness: Reports  that she has been doing fairly well. She is still awaiting dental work, states she has an Ecologist with cardiology, of note i did send a letter of cklearance several weeks ago. Denies recent fever or chills. Denies sinus pressure, nasal congestion , ear pain or sore throat. Denies chest congestion, or cough productive of sputum. Denies chest pain, palpitations, PND,or  orthopnea she does have mild leg swelling. Denies abdominal pain, nausea, vomitting, diarrhea or constipation. Denies change in bowel movements or bloody stool. Denies dysuria , frequency, incontinence or hesitancy. xchronic knee pain with stiffness, which limits her mobility, she has no h/o falls however. Denies headaches, vertigo, seizures. Denies depression, anxiety or insomnia. Denies  rash, lesions, or itch.     Allergies (verified): No Known Drug Allergies  Review of Systems      See HPI Eyes:  Complains of vision loss-both eyes; denies discharge, eye pain, and red eye; wears corrective lenses. MS:  Complains of joint pain, joint swelling, muscle weakness, and stiffness; severe osteoarthritis of the knees. Psych:  Denies anxiety and depression. Endo:  Denies cold intolerance, excessive hunger, excessive thirst, and excessive urination. Heme:  Denies abnormal bruising and bleeding. Allergy:  Denies hives or rash and itching eyes.  Physical Exam  General:  Well-developed,well-nourished,in no acute  distress; alert,appropriate and cooperative throughout examination HEENT: No facial asymmetry,  EOMI, No sinus tenderness, TM's Clear, oropharynx  pink and moist. poor dentition  Chest: Clear to auscultation bilaterally.  CVS: S1, S2, pansystolic murmur, No S3.   Abd: Soft, Nontender.  MS: decreased ROM spine, hips, shoulders and knees.  Ext: No edema.   CNS: CN 2-12 intact, power tone and sensation normal throughout.   Skin: Intact, no visible lesions or rashes.  Psych: Good eye contact, normal affect.  Memory intact, not anxious or depressed appearing. ]   Impression & Recommendations:  Problem # 1:  HYPERTENSION (ICD-401.9) Assessment Unchanged  The following medications were removed from the medication list:    Metoprolol Tartrate 50 Mg Tabs (Metoprolol tartrate) ..... One tabs by mouth bid Her updated medication list for this problem includes:    Furosemide 20 Mg Tabs (Furosemide) .Marland Kitchen... 1 by mouth once daily    Diovan 80 Mg Tabs (Valsartan) .Marland Kitchen... Take 1 tablet by mouth once a day    Metoprolol Tartrate 50 Mg Tabs (Metoprolol tartrate) .Marland Kitchen... Take 1 tablet by mouth two times a day  Orders: T-Basic Metabolic Panel [id number])  BP today: 152/80 Prior BP: 160/100 (11/06/2009)  Labs Reviewed: K+: 4.9 (11/06/2009) Creat: : 1.25 (11/06/2009)   Chol: 167 (11/06/2009)   HDL: 52 (11/06/2009)   LDL: 93 (11/06/2009)   TG: 110 (11/06/2009)  Problem # 2:  DIVERTICULOSIS, COLON (ICD-562.10) Assessment: Unchanged  bno recent h/o gI bleeding  Orders: Hemoccult Guaiac-1  spec.(in office) ([id number])  Problem # 3:  HYPERLIPIDEMIA (ICD-272.4) Assessment: Improved  Her updated medication list for this problem includes:    Simvastatin 20 Mg Tabs (Simvastatin) .Marland Kitchen... Take 1 tab by mouth at bedtime  Orders: T-Hepatic Function [id number]) T-Lipid Profile [id number])  Labs Reviewed: SGOT: 17 (11/06/2009)   SGPT: 13 (11/06/2009)   HDL:52 (11/06/2009), 67 (01/12/2009)  LDL:93  (11/06/2009), 105 (01/12/2009)  Chol:167 (11/06/2009), 186 (01/12/2009)  Trig:110 (11/06/2009), 69 (01/12/2009)  Complete Medication List: 1)  Flonase 50 Mcg/act Susp (Fluticasone propionate) .Marland Kitchen.. 1 spray once daily 2)  Simvastatin 20 Mg Tabs (Simvastatin) .... Take 1 tab by mouth at bedtime 3)  Furosemide 20 Mg Tabs (Furosemide) .Marland Kitchen.. 1 by mouth once daily 4)  Tylenol Arthritis Pain 650 Mg Tbcr (Acetaminophen) .... As needed 5)  Tramadol Hcl 50 Mg Tabs (Tramadol hcl) .Marland Kitchen.. 1 by mouth four times a day 6)  Alendronate Sodium 70 Mg Tabs (Alendronate sodium) .Marland Kitchen.. 1 by mouth every week 7)  Klor-con 10 10 Meq Cr-tabs (Potassium chloride) .... One tab by mouth once daily, two tabs on mon, wed, fri 8)  Allopurinol 100 Mg Tabs (Allopurinol) .... Take 1 tablet by mouth once a day 9)  Diovan 80 Mg Tabs (Valsartan) .... Take 1 tablet by mouth once a day 10)  Metoprolol Tartrate 50 Mg Tabs (Metoprolol tartrate) .... Take 1 tablet by mouth two times a day 11)  Oscal 500/200 D-3 500-200 Mg-unit Tabs (Calcium-vitamin d) .... Take 1 tablet by mouth three times a day 12)  Colace 100 Mg Caps (Docusate sodium) .... 3 capsules at bedtime  Other Orders: T-CBC w/Diff ([id number]) T-TSH [id number]) Influenza Vaccine NON MCR (44010)  Patient Instructions: 1)  f/u in january.(mid to end) 2)  BMP prior to visit, ICD-9: 3)  Hepatic Panel prior to visit, ICD-9: 4)  Lipid Panel prior to visit, ICD-9: 5)  TSH prior to visit, ICD-9:   fasting in January 6)  CBC w/ Diff prior to visit, ICD-9:, uric acid 7)  meds as reviewed. 8)  You are doing well 9)  flu vaccine and rectal exam today Prescriptions: FUROSEMIDE 20 MG  TABS (FUROSEMIDE) 1 by mouth once daily  #30 Each x 3   Entered by:   Mauricia Area CMA   Authorized by:   [name] MD   Signed by:   Mauricia Area CMA on 01/08/2010   Method used:   Electronically to        [facility] (retail)       [address]       [place], [place]  [address]       Ph: [phone]       Fax: [phone]   RxID:   [id number]    Immunizations Administered:  Influenza Vaccine:    Vaccine Type: Fluvax Non-MCR    Site: right deltoid    Mfr: novartis    Dose: 0.5 ml    Route: IM    Given by: Mauricia Area CMA    Exp. Date: 07/2010    Lot #: [id number]    VIS given: 10/20/09 version given January 08, 2010.   Immunizations Administered:  Influenza Vaccine:    Vaccine Type: Fluvax Non-MCR    Site: right deltoid    Mfr: novartis    Dose: 0.5 ml    Route: IM    Given by: Mauricia Area CMA    Exp. Date: 07/2010    Lot #:  [id number]    VIS given: 10/20/09 version given January 08, 2010.   Appended Document: F UP  Laboratory Results  Date/Time Received: January 08, 2010 Date/Time Reported: January 08, 2010  Stool - Occult Blood Hemmoccult #1: negative Date: 01/08/2010 Comments: 118 10-12 50201 10L 02-13 Mauricia Area CMA  January 11, 2010 9:08 AM

## 2010-04-27 NOTE — Assessment & Plan Note (Signed)
Summary: RECERT   Allergies: No Known Drug Allergies   Complete Medication List: 1)  Flonase 50 Mcg/act Susp (Fluticasone propionate) .Marland Kitchen.. 1 spray once daily 2)  Simvastatin 20 Mg Tabs (Simvastatin) .... Take 1 tab by mouth at bedtime 3)  Furosemide 20 Mg Tabs (Furosemide) .Marland Kitchen.. 1 by mouth once daily 4)  Tylenol Arthritis Pain 650 Mg Tbcr (Acetaminophen) .... As needed 5)  Colace 100 Mg Caps (Docusate sodium) .... Three caps by mouth qhs 6)  Tramadol Hcl 50 Mg Tabs (Tramadol hcl) .Marland Kitchen.. 1 by mouth four times a day 7)  Diovan 160 Mg Tabs (Valsartan) .Marland Kitchen.. 1 by mouth once daily 8)  Alendronate Sodium 70 Mg Tabs (Alendronate sodium) .Marland Kitchen.. 1 by mouth every week 9)  Cvs Oyster Shell Calcium-vit D 500-200 Mg-unit Tabs (Calcium carbonate-vitamin d) .... One tab by mouth tid 10)  Metoprolol Tartrate 50 Mg Tabs (Metoprolol tartrate) .... One and half tabs by mouth bid 11)  Klor-con 10 10 Meq Cr-tabs (Potassium chloride) .... One tab by mouth once daily, two tabs on mon, wed, fri 12)  Benefiber Powd (Wheat dextrin) .... One tsp once daily 13)  Doxycycline Hyclate 100 Mg Caps (Doxycycline hyclate) .... Take 1 tablet by mouth two times a day 14)  Allopurinol 100 Mg Tabs (Allopurinol) .... Take 1 tablet by mouth once a day 15)  Keflex 500 Mg Caps (Cephalexin) .... Take 1 capsule by mouth two times a day recertification reviewed and signed

## 2010-04-27 NOTE — Assessment & Plan Note (Signed)
Summary: office visit   Vital Signs:  Patient profile:   77 year old female Height:      63 inches Weight:      137 pounds O2 Sat:      95 % Pulse rate:   80 / minute Pulse rhythm:   irregular Resp:     16 per minute BP sitting:   90 / 60  (left arm)  Vitals Entered By: Everitt Amber LPN (July 21, 2009 10:19 AM) CC: knees and right hip hurting   Primary Care Provider:  Dr. [name]  CC:  knees and right hip hurting.  History of Present Illness: Reports  thatshe has not beenn doing well. Denies recent fever or chills. Denies sinus pressure, nasal congestion , ear pain or sore throat. Denies chest congestion, or cough productive of sputum. Denies chest pain, palpitations, PND, orthopnea or leg swelling. she does report increased fatigue and light headedness. Denies abdominal pain, nausea, vomitting, diarrhea or constipation. Denies change in bowel movements or bloody stool. Denies dysuria , frequency, incontinence or hesitancy. Reprts  joint pain, swelling, and  reduced mobility.She has not fallen Denies headaches, vertigo, seizures. Denies depression, anxiety or insomnia. Denies  rash, lesions, or itch.     Current Medications (verified): 1)  Flonase 50 Mcg/act  Susp (Fluticasone Propionate) .Marland Kitchen.. 1 Spray Once Daily 2)  Simvastatin 20 Mg Tabs (Simvastatin) .... Take 1 Tab By Mouth At Bedtime 3)  Furosemide 20 Mg  Tabs (Furosemide) .Marland Kitchen.. 1 By Mouth Once Daily 4)  Tylenol Arthritis Pain 650 Mg  Tbcr (Acetaminophen) .... As Needed 5)  Colace 100 Mg  Caps (Docusate Sodium) .... Three Caps By Mouth Qhs 6)  Tramadol Hcl 50 Mg  Tabs (Tramadol Hcl) .Marland Kitchen.. 1 By Mouth Four Times A Day 7)  Diovan 160 Mg  Tabs (Valsartan) .Marland Kitchen.. 1 By Mouth Once Daily 8)  Alendronate Sodium 70 Mg  Tabs (Alendronate Sodium) .Marland Kitchen.. 1 By Mouth Every Week 9)  Cvs Oyster Shell Calcium-Vit D 500-200 Mg-Unit Tabs (Calcium Carbonate-Vitamin D) .... One Tab By Mouth Tid 10)  Metoprolol Tartrate 50 Mg Tabs  (Metoprolol Tartrate) .... One and Half Tabs By Mouth Bid 11)  Klor-Con 10 10 Meq Cr-Tabs (Potassium Chloride) .... One Tab By Mouth Once Daily, Two Tabs On Mon, Wed, Fri 12)  Allopurinol 100 Mg Tabs (Allopurinol) .... Take 1 Tablet By Mouth Once A Day  Allergies (verified): No Known Drug Allergies  Review of Systems      See HPI General:  Complains of fatigue. Eyes:  Complains of vision loss-both eyes. CV:  Complains of shortness of breath with exertion. MS:  Complains of joint pain, low back pain, mid back pain, and stiffness. Endo:  Denies cold intolerance, excessive hunger, excessive thirst, excessive urination, heat intolerance, polyuria, and weight change. Heme:  Denies abnormal bruising and bleeding. Allergy:  Denies hives or rash and itching eyes.  Physical Exam  General:  alert, well-nourished, and well-hydrated.  HEENT: No facial asymmetry,  EOMI, No sinus tenderness, TM's Clear, oropharynx  pink and moist.   Chest: Clear to auscultation bilaterally.  CVS: S1, S2, systolic murmur, No S3.   Abd: Soft, Nontender.  TK:ZSWFUXNATF ROM spine, hips, shoulders and knees.  Ext: No edema.   CNS: CN 2-12 intact, power tone and sensation normal throughout.   Skin: Intact, no visible lesions or rashes.  Psych: Good eye contact, normal affect.  Memory intact, not anxious or depressed appearing.    Impression & Recommendations:  Problem #  1:  HYPERTENSION (ICD-401.9) Assessment Comment Only  The following medications were removed from the medication list:    Diovan 160 Mg Tabs (Valsartan) .Marland Kitchen... 1 by mouth once daily Her updated medication list for this problem includes:    Furosemide 20 Mg Tabs (Furosemide) .Marland Kitchen... 1 by mouth once daily    Metoprolol Tartrate 50 Mg Tabs (Metoprolol tartrate) ..... One and half tabs by mouth bid    Diovan 80 Mg Tabs (Valsartan) .Marland Kitchen... Take 1 tablet by mouth once a day  BP today: 90/60obvercorrected Prior BP: 120/68 (04/20/2009)  Labs  Reviewed: K+: 5.3 (04/20/2009) Creat: : 1.45 (04/20/2009)   Chol: 186 (01/12/2009)   HDL: 67 (01/12/2009)   LDL: 105 (01/12/2009)   TG: 69 (01/12/2009)  Problem # 2:  HYPERLIPIDEMIA (ICD-272.4) Assessment: Comment Only  Her updated medication list for this problem includes:    Simvastatin 20 Mg Tabs (Simvastatin) .Marland Kitchen... Take 1 tab by mouth at bedtime  Labs Reviewed: SGOT: 12 (01/12/2009)   SGPT: <8 U/L (01/12/2009)   HDL:67 (01/12/2009), 69 (05/30/2008)  LDL:105 (01/12/2009), 97 (16/12/9602)  Chol:186 (01/12/2009), 180 (05/30/2008)  Trig:69 (01/12/2009), 71 (05/30/2008)  Problem # 3:  FATIGUE (ICD-780.79) Assessment: Deteriorated  Orders: T-Basic Metabolic Panel [id number]) T-CBC w/Diff [phone])  Problem # 4:  ARTHRITIS (ICD-716.90) Assessment: Deteriorated  Complete Medication List: 1)  Flonase 50 Mcg/act Susp (Fluticasone propionate) .Marland Kitchen.. 1 spray once daily 2)  Simvastatin 20 Mg Tabs (Simvastatin) .... Take 1 tab by mouth at bedtime 3)  Furosemide 20 Mg Tabs (Furosemide) .Marland Kitchen.. 1 by mouth once daily 4)  Tylenol Arthritis Pain 650 Mg Tbcr (Acetaminophen) .... As needed 5)  Colace 100 Mg Caps (Docusate sodium) .... Three caps by mouth qhs 6)  Tramadol Hcl 50 Mg Tabs (Tramadol hcl) .Marland Kitchen.. 1 by mouth four times a day 7)  Alendronate Sodium 70 Mg Tabs (Alendronate sodium) .Marland Kitchen.. 1 by mouth every week 8)  Cvs Oyster Shell Calcium-vit D 500-200 Mg-unit Tabs (Calcium carbonate-vitamin d) .... One tab by mouth tid 9)  Metoprolol Tartrate 50 Mg Tabs (Metoprolol tartrate) .... One and half tabs by mouth bid 10)  Klor-con 10 10 Meq Cr-tabs (Potassium chloride) .... One tab by mouth once daily, two tabs on mon, wed, fri 11)  Allopurinol 100 Mg Tabs (Allopurinol) .... Take 1 tablet by mouth once a day 12)  Diovan 80 Mg Tabs (Valsartan) .... Take 1 tablet by mouth once a day  Other Orders: T-Vitamin D (25-Hydroxy) ([id number]) T-Uric Acid (Blood) ([id number])  Patient Instructions: 1)   Please schedule a follow-up appointment in 1 month. 2)  BMP prior to visit, ICD-9: 3)  CBC w/ Diff prior to visit, ICD-9: and vit d today 4)  STOP diovan 160mg . 5)  start diovan 80mg  one daily tomorrow , your blood pressure is low Prescriptions: DIOVAN 80 MG TABS (VALSARTAN) Take 1 tablet by mouth once a day  #30 x 2   Entered and Authorized by:   [name] MD   Signed by:   [name] MD on 07/21/2009   Method used:   Printed then faxed to ...       [facility] (retail)       [address]       [place], [place]  [address]       Ph: [phone]       Fax: [phone]   RxID:   [id number]

## 2010-04-27 NOTE — Letter (Signed)
Summary: med review sheet  med review sheet   Imported By: Rudene Anda 01/11/2010 16:55:33  _____________________________________________________________________  External Attachment:    Type:   Image     Comment:   External Document

## 2010-04-27 NOTE — Progress Notes (Signed)
Summary: paper  Phone Note Call from Patient   Summary of Call: calling from doctors office and checking on paper for pt. jamie [phone] Initial call taken by: Rudene Anda,  November 05, 2009 10:47 AM  Follow-up for Phone Call        her medical clearance paper is with dr. Lodema Hong. She has to be cleared through cardio. first before she can sign  Follow-up by: Everitt Amber LPN,  November 06, 2009 11:12 AM  Additional Follow-up for Phone Call Additional follow up Details #1::        called Jensens office and left msg that we were waiting on cardio Additional Follow-up by: Everitt Amber LPN,  November 06, 2009 3:29 PM

## 2010-04-27 NOTE — Assessment & Plan Note (Signed)
Summary: left knee pain- room 1   Vital Signs:  Patient profile:   77 year old female Height:      63 inches Weight:      138 pounds BMI:     24.53 O2 Sat:      98 % on Room air Pulse rate:   83 / minute Resp:     16 per minute BP sitting:   110 / 60  (left arm)  Vitals Entered By: Adella Hare LPN (September 25, 1608 10:37 AM) CC: left knee pain Is Patient Diabetic? No   Primary Provider:  Dr. [name]  CC:  left knee pain.  History of Present Illness: Pt presents today with increased Lt knee pain x 2 days.  Hx of DJD bilat knees.  No trauma.  Was a little swollen yesterday but better today.  No redness.  Hx of htn and valvular heart disease. Taking medications as prescribed. No headache, chest pain or palpitations.  No dizziness or lightheadedness.  Pt did have some heartburn last week x 1.  Took 2 over the counter Tums and resolved.  Does not occur often.   Allergies (verified): No Known Drug Allergies  Past History:  Past medical history reviewed for relevance to current acute and chronic problems.  Past Medical History: Reviewed history from 01/20/2009 and no changes required. Atrial fibrillation, paroxysmal Diverticulosis, pancolonic Hyperlipidemia Hypertension with severe left ventricular hypertrophy; normal ejection fraction in 04/2005 Valvular heart disease: Mild to moderate aortic stenosis; moderate to severe mitral stenosis in 2007; mild MR Degenerative joint disease Chronic kidney disease-stage II Anemia-iron deficiency, chronic Osteoporosis Left popliteal cyst Hyperkalemia-mild Diverticular disease with a history of diverticulitis  Review of Systems CV:  Denies chest pain or discomfort, lightheadness, and palpitations. Resp:  Denies shortness of breath. MS:  Complains of joint pain and joint swelling; denies joint redness. Neuro:  Denies headaches.  Physical Exam  General:  Well-developed,well-nourished,in no acute distress;  alert,appropriate and cooperative throughout examination Head:  Normocephalic and atraumatic without obvious abnormalities. No apparent alopecia or balding. Lungs:  Normal respiratory effort, chest expands symmetrically. Lungs are clear to auscultation, no crackles or wheezes. Heart:  normal rate and grade 2 /6 systolic murmur.   Msk:  Lt knee:  pt points to area of medial joint line as area of pain.  Nontender to palp.  No effusion.  No erythema.  Nodular deformitites consistent with DJD noted bilat knees. Pulses:  L posterior tibial normal and L dorsalis pedis normal.   Extremities:  No PTE Neurologic:  sensation intact to light touch.  Pt ambulates with a cane Skin:  Intact without suspicious lesions or rashes & color normal.   Psych:  Cognition and judgment appear intact. Alert and cooperative with normal attention span and concentration. No apparent delusions, illusions, hallucinations   Impression & Recommendations:  Problem # 1:  DEGENERATIVE JOINT DISEASE, KNEE (ICD-715.96) Assessment Deteriorated  Her updated medication list for this problem includes:    Tylenol Arthritis Pain 650 Mg Tbcr (Acetaminophen) .Marland Kitchen... As needed    Tramadol Hcl 50 Mg Tabs (Tramadol hcl) .Marland Kitchen... 1 by mouth four times a day  Problem # 2:  HYPERTENSION (ICD-401.9) Assessment: Comment Only  Her updated medication list for this problem includes:    Furosemide 20 Mg Tabs (Furosemide) .Marland Kitchen... 1 by mouth once daily    Metoprolol Tartrate 50 Mg Tabs (Metoprolol tartrate) ..... One tabs by mouth bid    Diovan 80 Mg Tabs (Valsartan) .Marland Kitchen... Take 1  tablet by mouth once a day  BP today: 110/60 Prior BP: 90/60 (08/19/2009)  Labs Reviewed: K+: 5.3 (07/21/2009) Creat: : 2.27 (07/21/2009)   Chol: 186 (01/12/2009)   HDL: 67 (01/12/2009)   LDL: 105 (01/12/2009)   TG: 69 (01/12/2009)  Orders: T-CMP with estimated GFR ([address])  Problem # 3:  CHRONIC KIDNEY DISEASE -MILD (ICD-585.9) Assessment: Comment  Only  Orders: T-CMP with estimated GFR ([address])  Complete Medication List: 1)  Flonase 50 Mcg/act Susp (Fluticasone propionate) .Marland Kitchen.. 1 spray once daily 2)  Simvastatin 20 Mg Tabs (Simvastatin) .... Take 1 tab by mouth at bedtime 3)  Furosemide 20 Mg Tabs (Furosemide) .Marland Kitchen.. 1 by mouth once daily 4)  Tylenol Arthritis Pain 650 Mg Tbcr (Acetaminophen) .... As needed 5)  Colace 100 Mg Caps (Docusate sodium) .... Three caps by mouth qhs 6)  Tramadol Hcl 50 Mg Tabs (Tramadol hcl) .Marland Kitchen.. 1 by mouth four times a day 7)  Alendronate Sodium 70 Mg Tabs (Alendronate sodium) .Marland Kitchen.. 1 by mouth every week 8)  Cvs Oyster Shell Calcium-vit D 500-200 Mg-unit Tabs (Calcium carbonate-vitamin d) .... One tab by mouth tid 9)  Metoprolol Tartrate 50 Mg Tabs (Metoprolol tartrate) .... One tabs by mouth bid 10)  Klor-con 10 10 Meq Cr-tabs (Potassium chloride) .... One tab by mouth once daily, two tabs on mon, wed, fri 11)  Allopurinol 100 Mg Tabs (Allopurinol) .... Take 1 tablet by mouth once a day 12)  Diovan 80 Mg Tabs (Valsartan) .... Take 1 tablet by mouth once a day 13)  Septra Ds 800-160 Mg Tabs (Sulfamethoxazole-trimethoprim) .... Take 1 two times a day for 5 days  Other Orders: Depo- Medrol 40mg  (J1030) Ketorolac-Toradol 15mg  (H4742) Admin of Therapeutic Inj  intramuscular or subcutaneous (59563) T-Lipid Profile (87564-33295) T-CBC No Diff ([id number])  Patient Instructions: 1)  Keep your next appt with Dr Lodema Hong.  We will see you sooner if needed. 2)  Continue your current medications. 3)  You have received Depo Medrol and Toradol today for your arthritis.   Medication Administration  Injection # 1:    Medication: Depo- Medrol 40mg     Diagnosis: KNEE PAIN, BILATERAL (ICD-719.46)    Route: IM    Site: RUOQ gluteus    Exp Date: 1/13    Lot #: [id number] [id number]    Mfr: Pharmacia    Patient tolerated injection without complications    Given by: Adella Hare LPN (September 26, 158 10:56 AM)  Injection #  2:    Medication: Ketorolac-Toradol 15mg     Diagnosis: ARTHRITIS, KNEES, BILATERAL (ICD-716.98)    Route: IM    Site: LUOQ gluteus    Exp Date: 12/26/2009    Lot #: [id number]    Mfr: [email or web address]    Comments: toradol 15mg  given    Patient tolerated injection without complications    Given by: Adella Hare LPN (September 26, 5730 10:57 AM)  Orders Added: 1)  Depo- Medrol 40mg  [J1030] 2)  Ketorolac-Toradol 15mg  [J1885] 3)  Admin of Therapeutic Inj  intramuscular or subcutaneous [96372] 4)  T-CMP with estimated GFR [[address]] 5)  T-Lipid Profile [80061-22930] 6)  T-CBC No Diff [85027-10000] 7)  Est. Patient Level IV [20254]

## 2010-04-27 NOTE — Miscellaneous (Signed)
Summary: Home Care Report  Home Care Report   Imported By: Lind Guest 09/07/2009 14:20:33  _____________________________________________________________________  External Attachment:    Type:   Image     Comment:   External Document

## 2010-04-27 NOTE — Miscellaneous (Signed)
Summary: Home Care Report  Home Care Report   Imported By: Lind Guest 05/11/2009 16:09:52  _____________________________________________________________________  External Attachment:    Type:   Image     Comment:   External Document

## 2010-04-27 NOTE — Miscellaneous (Signed)
Summary: [employer] MEDICAID  [employer] MEDICAID   Imported By: Lind Guest 01/28/2010 09:28:55  _____________________________________________________________________  External Attachment:    Type:   Image     Comment:   External Document

## 2010-04-27 NOTE — Assessment & Plan Note (Signed)
Summary: office visit   Vital Signs:  Patient profile:   77 year old female Height:      63 inches Weight:      140 pounds O2 Sat:      95 % Pulse rate:   72 / minute Pulse rhythm:   irregular Resp:     16 per minute BP sitting:   120 / 68 Cuff size:   regular  Vitals Entered By: Everitt Amber (April 20, 2009 12:59 PM) CC: Both feet and knees bothering her, having some pain on 2nd toe on right foot also   Primary Care Provider:  Dr. [name]  CC:  Both feet and knees bothering her and having some pain on 2nd toe on right foot also.  History of Present Illness: Reports  that she has been doing fairly well. Denies recent fever or chills. Denies sinus pressure, nasal congestion , ear pain or sore throat. Denies chest congestion, or cough productive of sputum. Denies chest pain, palpitations, PND, orthopnea or leg swelling. Denies abdominal pain, nausea, vomitting, diarrhea or constipation. Denies change in bowel movements or bloody stool. Denies dysuria , frequency, incontinence or hesitancy.  Denies headaches, vertigo, seizures. Denies depression, anxiety or insomnia. Denies  rash, lesions, or itch.     Allergies: No Known Drug Allergies  Review of Systems      See HPI Eyes:  Denies discharge, eye pain, and red eye. MS:  Complains of joint pain and stiffness; Increasedr right 2nd toe pain intermittently extremely severe at times x 1 month, no drainage from the toe, also c/o bilateral knee pain and stiffness, no falls. Neuro:  Denies headaches, seizures, and sensation of room spinning. Endo:  Denies cold intolerance, excessive hunger, excessive thirst, excessive urination, heat intolerance, polyuria, and weight change. Heme:  Denies abnormal bruising and bleeding. Allergy:  Denies hives or rash and sneezing.  Physical Exam  General:  alert, well-nourished, and well-hydrated.  HEENT: No facial asymmetry,  EOMI, No sinus tenderness, TM's Clear, oropharynx   pink and moist.   Chest: Clear to auscultation bilaterally.  CVS: S1, S2, systolic murmur, No S3.   Abd: Soft, Nontender.  ZO:XWRUEAVWUJ ROM spine, hips, shoulders and knees. Right second toe swollenhyperpigmented, mildly tender, no drainage.r Ext: No edema.   CNS: CN 2-12 intact, power tone and sensation normal throughout.   Skin: Intact, no visible lesions or rashes.  Psych: Good eye contact, normal affect.  Memory intact, not anxious or depressed appearing.    Impression & Recommendations:  Problem # 1:  TOE PAIN (ICD-729.5) Assessment Improved  Orders: Radiology other (Radiology Other)  Problem # 2:  HYPERTENSION (ICD-401.9) Assessment: Unchanged  Her updated medication list for this problem includes:    Furosemide 20 Mg Tabs (Furosemide) .Marland Kitchen... 1 by mouth once daily    Diovan 160 Mg Tabs (Valsartan) .Marland Kitchen... 1 by mouth once daily    Metoprolol Tartrate 50 Mg Tabs (Metoprolol tartrate) ..... One and half tabs by mouth bid  Orders: T-Basic Metabolic Panel [id number]) T-Basic Metabolic Panel [phone])  BP today: 120/68 Prior BP: 118/72 (02/04/2009)  Labs Reviewed: K+: 4.6 (01/12/2009) Creat: : 1.41 (01/12/2009)   Chol: 186 (01/12/2009)   HDL: 67 (01/12/2009)   LDL: 105 (01/12/2009)   TG: 69 (01/12/2009)  Problem # 3:  HYPERLIPIDEMIA (ICD-272.4) Assessment: Comment Only  Her updated medication list for this problem includes:    Simvastatin 20 Mg Tabs (Simvastatin) .Marland Kitchen... Take 1 tab by mouth at bedtime  Orders: T-Hepatic Function [phone]) T-Lipid  Profile [phone])  Labs Reviewed: SGOT: 12 (01/12/2009)   SGPT: <8 U/L (01/12/2009)   HDL:67 (01/12/2009), 69 (05/30/2008)  LDL:105 (01/12/2009), 97 (14/78/2956)  Chol:186 (01/12/2009), 180 (05/30/2008)  Trig:69 (01/12/2009), 71 (05/30/2008)  Problem # 4:  KNEE PAIN, BILATERAL (ICD-719.46) Assessment: Deteriorated  Her updated medication list for this problem includes:    Tylenol Arthritis Pain 650 Mg Tbcr  (Acetaminophen) .Marland Kitchen... As needed    Tramadol Hcl 50 Mg Tabs (Tramadol hcl) .Marland Kitchen... 1 by mouth four times a day toradol and depomedrol administered  Complete Medication List: 1)  Flonase 50 Mcg/act Susp (Fluticasone propionate) .Marland Kitchen.. 1 spray once daily 2)  Simvastatin 20 Mg Tabs (Simvastatin) .... Take 1 tab by mouth at bedtime 3)  Furosemide 20 Mg Tabs (Furosemide) .Marland Kitchen.. 1 by mouth once daily 4)  Tylenol Arthritis Pain 650 Mg Tbcr (Acetaminophen) .... As needed 5)  Colace 100 Mg Caps (Docusate sodium) .... Three caps by mouth qhs 6)  Tramadol Hcl 50 Mg Tabs (Tramadol hcl) .Marland Kitchen.. 1 by mouth four times a day 7)  Diovan 160 Mg Tabs (Valsartan) .Marland Kitchen.. 1 by mouth once daily 8)  Alendronate Sodium 70 Mg Tabs (Alendronate sodium) .Marland Kitchen.. 1 by mouth every week 9)  Cvs Oyster Shell Calcium-vit D 500-200 Mg-unit Tabs (Calcium carbonate-vitamin d) .... One tab by mouth tid 10)  Metoprolol Tartrate 50 Mg Tabs (Metoprolol tartrate) .... One and half tabs by mouth bid 11)  Klor-con 10 10 Meq Cr-tabs (Potassium chloride) .... One tab by mouth once daily, two tabs on mon, wed, fri 12)  Benefiber Powd (Wheat dextrin) .... One tsp once daily 13)  Doxycycline Hyclate 100 Mg Caps (Doxycycline hyclate) .... Take 1 tablet by mouth two times a day 14)  Allopurinol 100 Mg Tabs (Allopurinol) .... Take 1 tablet by mouth once a day 15)  Keflex 500 Mg Caps (Cephalexin) .... Take 1 capsule by mouth two times a day  Other Orders: T-Uric Acid (Blood) ([id number]) T-CBC w/Diff (69629-52841) Depo- Medrol 40mg  (J1030) Ketorolac-Toradol 15mg  (L2440) Admin of Therapeutic Inj  intramuscular or subcutaneous (10272)  Patient Instructions: 1)  Please schedule a follow-up appointment in 3 months. 2)  You will get med for the right 2nd toe pain, and I will also request an xray. 3)  Uric acid level and chem 7 today 4)  Two injections today arthritic pain in the knees. 5)  BMP prior to visit, ICD-9: 6)  Hepatic Panel prior to visit,  ICD-9:   fsting in 3 months 7)  Lipid Panel prior to visit, ICD-9: 8)  CBC w/ Diff prior to visit, ICD-9: Prescriptions: KEFLEX 500 MG CAPS (CEPHALEXIN) Take 1 capsule by mouth two times a day  #20 x 0   Entered and Authorized by:   [name] MD   Signed by:   [name] MD on 04/20/2009   Method used:   Electronically to        [facility] (retail)       [address]       [place], [place]  [address]       Ph: [phone]       Fax: [phone]   RxID:   [id number]    Medication Administration  Injection # 1:    Medication: Depo- Medrol 40mg     Diagnosis: KNEE PAIN, BILATERAL (ICD-719.46)    Route: IM    Site: RUOQ gluteus    Exp Date: 06/2009    Lot #:  [id number]    Mfr: Pharmacia    Comments: 40 mg given     Patient tolerated injection without complications    Given by: Everitt Amber (April 20, 2009 4:51 PM)  Injection # 2:    Medication: Ketorolac-Toradol 15mg     Diagnosis: KNEE PAIN, BILATERAL (ICD-719.46)    Route: IM    Site: LUOQ gluteus    Exp Date: 07/2010    Lot #: [id number]     Mfr: novaplus    Comments: 60 mg given     Patient tolerated injection without complications    Given by: Everitt Amber (April 20, 2009 4:51 PM)  Orders Added: 1)  Est. Patient Level IV [56213] 2)  Radiology other [Radiology Other] 3)  T-Basic Metabolic Panel [80048-22910] 4)  T-Uric Acid (Blood) [84550-23180] 5)  T-Basic Metabolic Panel [80048-22910] 6)  T-Hepatic Function [80076-22960] 7)  T-Lipid Profile [80061-22930] 8)  T-CBC w/Diff [08657-84696] 9)  Depo- Medrol 40mg  [J1030] 10)  Ketorolac-Toradol 15mg  [J1885] 11)  Admin of Therapeutic Inj  intramuscular or subcutaneous [29528]

## 2010-04-27 NOTE — Progress Notes (Signed)
  Prescriptions: ALENDRONATE SODIUM 70 MG  TABS (ALENDRONATE SODIUM) 1 by mouth every week  #4 Each x 3   Entered by:   Mauricia Area CMA   Authorized by:   [name] MD   Signed by:   Mauricia Area CMA on 01/25/2010   Method used:   Electronically to        [facility] (retail)       [address]       [place], [place]  [address]       Ph: [phone]       Fax: [phone]   RxID:   [id number]

## 2010-04-27 NOTE — Letter (Signed)
Summary: Clearance Letter  [facility]  [address] [place] [address]   Phone: [phone]  Fax: [phone]    February 05, 2010  Re:     [name] Address:   [address]     [place], [place]  [address] DOB:     08-Nov-1917 MRN:     [id number]   Dear Dr. Ocie Doyne and Hassan Buckler, PA       Judy Grant has been cleared to have dental extraction from oral   surgeon with  IV sedation.  Pt is not currently on any anticoagulants.    Please feel free to call our office if any further help is needed for   this patient.             Sincerely,  Dr. Butler Bing, MD, [facility]

## 2010-04-27 NOTE — Miscellaneous (Signed)
Summary: Home Care Report  Home Care Report   Imported By: Lind Guest 01/11/2010 14:15:18  _____________________________________________________________________  External Attachment:    Type:   Image     Comment:   External Document

## 2010-04-27 NOTE — Miscellaneous (Signed)
Summary: Home Care Report  Home Care Report   Imported By: Lind Guest 06/19/2009 08:47:52  _____________________________________________________________________  External Attachment:    Type:   Image     Comment:   External Document

## 2010-04-27 NOTE — Progress Notes (Signed)
  Phone Note From Other Clinic   Caller: home health [place] Summary of Call: patient's bp 70/54 in right arm 72/50 in left arm, nurse states patient has taken bp meds today, patient states she feels good  advised if she starts to feel lightheaded to go to er stop diovan- per dr Rashaun Curl Initial call taken by: Adella Hare LPN,  Aug 10, 2009 1:11 PM     Appended Document:  pls scedule appt with pA next week to re-eval blood pressure , pt was advised to d/c  blood pressure med based on a call in from home health nurse, pt is Judy Grant  Appended Document:  appt. 5.25.11 @ 11:00

## 2010-04-27 NOTE — Miscellaneous (Signed)
Summary: Home Care Report  Home Care Report   Imported By: Lind Guest 12/29/2009 13:34:15  _____________________________________________________________________  External Attachment:    Type:   Image     Comment:   External Document

## 2010-04-27 NOTE — Assessment & Plan Note (Signed)
Summary: bp check re eval- room 1   Vital Signs:  Patient profile:   77 year old female Height:      63 inches Weight:      139.50 pounds BMI:     24.80 O2 Sat:      100 % on Room air Pulse rate:   84 / minute Resp:     16 per minute BP sitting:   90 / 60  (left arm)  Vitals Entered By: Adella Hare LPN (Aug 19, 2009 11:03 AM)  Serial Vital Signs/Assessments:  Time      Position  BP       Pulse  Resp  Temp     By                     124/80                         Esperanza Sheets PA  CC: follow-up visit Is Patient Diabetic? No Pain Assessment Patient in pain? no        Primary Provider:  Dr. [name]  CC:  follow-up visit.  History of Present Illness: Pt is here today for f/u of her BP.  Last mos her Diovan dose was decreased to 80 mg daily, and pt states she was told to take Metoprolol 50 mg 1 two times a day instead of 1 1/2 two times a day. She denies lightheaded or dizziness. Pt denies chest pain. She states she did have some palpitations this morning.  These have resolved.  She missed her last cardiologist appt & has not rescheduled.  Pt also reports that she has been having some discomfort when she urinates.  She has some urge incontinence, but this in unchanged.  No increase in frequency.     Current Medications (verified): 1)  Flonase 50 Mcg/act  Susp (Fluticasone Propionate) .Marland Kitchen.. 1 Spray Once Daily 2)  Simvastatin 20 Mg Tabs (Simvastatin) .... Take 1 Tab By Mouth At Bedtime 3)  Furosemide 20 Mg  Tabs (Furosemide) .Marland Kitchen.. 1 By Mouth Once Daily 4)  Tylenol Arthritis Pain 650 Mg  Tbcr (Acetaminophen) .... As Needed 5)  Colace 100 Mg  Caps (Docusate Sodium) .... Three Caps By Mouth Qhs 6)  Tramadol Hcl 50 Mg  Tabs (Tramadol Hcl) .Marland Kitchen.. 1 By Mouth Four Times A Day 7)  Alendronate Sodium 70 Mg  Tabs (Alendronate Sodium) .Marland Kitchen.. 1 By Mouth Every Week 8)  Cvs Oyster Shell Calcium-Vit D 500-200 Mg-Unit Tabs (Calcium Carbonate-Vitamin D) .... One Tab By Mouth Tid 9)   Metoprolol Tartrate 50 Mg Tabs (Metoprolol Tartrate) .... One and Half Tabs By Mouth Bid 10)  Klor-Con 10 10 Meq Cr-Tabs (Potassium Chloride) .... One Tab By Mouth Once Daily, Two Tabs On Mon, Wed, Fri 11)  Allopurinol 100 Mg Tabs (Allopurinol) .... Take 1 Tablet By Mouth Once A Day 12)  Diovan 80 Mg Tabs (Valsartan) .... Take 1 Tablet By Mouth Once A Day  Allergies (verified): No Known Drug Allergies  Past History:  Past medical history reviewed for relevance to current acute and chronic problems.  Past Medical History: Reviewed history from 01/20/2009 and no changes required. Atrial fibrillation, paroxysmal Diverticulosis, pancolonic Hyperlipidemia Hypertension with severe left ventricular hypertrophy; normal ejection fraction in 04/2005 Valvular heart disease: Mild to moderate aortic stenosis; moderate to severe mitral stenosis in 2007; mild MR Degenerative joint disease Chronic kidney disease-stage II Anemia-iron deficiency, chronic Osteoporosis Left popliteal cyst Hyperkalemia-mild  Diverticular disease with a history of diverticulitis  Review of Systems General:  Denies chills and fever. CV:  Complains of palpitations; denies chest pain or discomfort and lightheadness. Resp:  Denies shortness of breath. GI:  Denies abdominal pain and change in bowel habits. GU:  Complains of dysuria and incontinence; denies urinary frequency. Neuro:  Denies headaches.  Physical Exam  General:  Well-developed,well-nourished,in no acute distress; alert,appropriate and cooperative throughout examination Head:  Normocephalic and atraumatic without obvious abnormalities. No apparent alopecia or balding. Ears:  External ear exam shows no significant lesions or deformities.  Otoscopic examination reveals clear canals, tympanic membranes are intact bilaterally without bulging, retraction, inflammation or discharge. Hearing is grossly normal bilaterally. Nose:  External nasal examination shows no  deformity or inflammation. Nasal mucosa are pink and moist without lesions or exudates. Mouth:  Oral mucosa and oropharynx without lesions or exudates.   Neck:  No deformities, masses, or tenderness noted. Lungs:  Normal respiratory effort, chest expands symmetrically. Lungs are clear to auscultation, no crackles or wheezes. Heart:  normal rate, regular rhythm, and Grade 3 /6 systolic ejection murmur.     Impression & Recommendations:  Problem # 1:  HYPERTENSION (ICD-401.9) Assessment Improved Continue meds at current dose.  Her updated medication list for this problem includes:    Furosemide 20 Mg Tabs (Furosemide) .Marland Kitchen... 1 by mouth once daily    Metoprolol Tartrate 50 Mg Tabs (Metoprolol tartrate) ..... One tabs by mouth bid    Diovan 80 Mg Tabs (Valsartan) .Marland Kitchen... Take 1 tablet by mouth once a day  Problem # 2:  CYSTITIS, ACUTE (ICD-595.0) Assessment: New  Her updated medication list for this problem includes:    Septra Ds 800-160 Mg Tabs (Sulfamethoxazole-trimethoprim) .Marland Kitchen... Take 1 two times a day for 5 days  Complete Medication List: 1)  Flonase 50 Mcg/act Susp (Fluticasone propionate) .Marland Kitchen.. 1 spray once daily 2)  Simvastatin 20 Mg Tabs (Simvastatin) .... Take 1 tab by mouth at bedtime 3)  Furosemide 20 Mg Tabs (Furosemide) .Marland Kitchen.. 1 by mouth once daily 4)  Tylenol Arthritis Pain 650 Mg Tbcr (Acetaminophen) .... As needed 5)  Colace 100 Mg Caps (Docusate sodium) .... Three caps by mouth qhs 6)  Tramadol Hcl 50 Mg Tabs (Tramadol hcl) .Marland Kitchen.. 1 by mouth four times a day 7)  Alendronate Sodium 70 Mg Tabs (Alendronate sodium) .Marland Kitchen.. 1 by mouth every week 8)  Cvs Oyster Shell Calcium-vit D 500-200 Mg-unit Tabs (Calcium carbonate-vitamin d) .... One tab by mouth tid 9)  Metoprolol Tartrate 50 Mg Tabs (Metoprolol tartrate) .... One tabs by mouth bid 10)  Klor-con 10 10 Meq Cr-tabs (Potassium chloride) .... One tab by mouth once daily, two tabs on mon, wed, fri 11)  Allopurinol 100 Mg Tabs  (Allopurinol) .... Take 1 tablet by mouth once a day 12)  Diovan 80 Mg Tabs (Valsartan) .... Take 1 tablet by mouth once a day 13)  Septra Ds 800-160 Mg Tabs (Sulfamethoxazole-trimethoprim) .... Take 1 two times a day for 5 days  Other Orders: Cardiology Referral (Cardiology) Urinalysis ([id number]) T-Culture, Urine ([id number])  Patient Instructions: 1)  Please schedule a follow-up appointment in 3 months. 2)  Continue your current medications. 3)  I have prescribed an antibiotic for your urinary infection. 4)  We will schedule an appt with Dr Dietrich Pates for you. Prescriptions: SEPTRA DS 800-160 MG TABS (SULFAMETHOXAZOLE-TRIMETHOPRIM) take 1 two times a day for 5 days  #10 x 0   Entered and Authorized by:   Esperanza Sheets PA  Signed by:   Esperanza Sheets PA on 08/19/2009   Method used:   Electronically to        [facility] (retail)       [address]       [place], [place]  [address]       Ph: [phone]       Fax: [phone]   RxID:   [id number]   Laboratory Results   Urine Tests  Date/Time Received: Aug 19, 2009 11:42 AM  Date/Time Reported: Aug 19, 2009 11:42 AM   Routine Urinalysis   Color: yellow Appearance: Cloudy Glucose: negative   (Normal Range: Negative) Bilirubin: negative   (Normal Range: Negative) Ketone: negative   (Normal Range: Negative) Spec. Gravity: 1.015   (Normal Range: 1.003-1.035) Blood: trace-lysed   (Normal Range: Negative) pH: 5.0   (Normal Range: 5.0-8.0) Protein: 30   (Normal Range: Negative) Urobilinogen: 0.2   (Normal Range: 0-1) Nitrite: positive   (Normal Range: Negative) Leukocyte Esterace: large   (Normal Range: Negative)

## 2010-04-27 NOTE — Assessment & Plan Note (Signed)
Summary: recert   Allergies: No Known Drug Allergies   Complete Medication List: 1)  Flonase 50 Mcg/act Susp (Fluticasone propionate) .Marland Kitchen.. 1 spray once daily 2)  Simvastatin 20 Mg Tabs (Simvastatin) .... Take 1 tab by mouth at bedtime 3)  Furosemide 20 Mg Tabs (Furosemide) .Marland Kitchen.. 1 by mouth once daily 4)  Tylenol Arthritis Pain 650 Mg Tbcr (Acetaminophen) .... As needed 5)  Colace 100 Mg Caps (Docusate sodium) .... Three caps by mouth qhs 6)  Tramadol Hcl 50 Mg Tabs (Tramadol hcl) .Marland Kitchen.. 1 by mouth four times a day 7)  Alendronate Sodium 70 Mg Tabs (Alendronate sodium) .Marland Kitchen.. 1 by mouth every week 8)  Cvs Oyster Shell Calcium-vit D 500-200 Mg-unit Tabs (Calcium carbonate-vitamin d) .... One tab by mouth tid 9)  Metoprolol Tartrate 50 Mg Tabs (Metoprolol tartrate) .... One tabs by mouth bid 10)  Klor-con 10 10 Meq Cr-tabs (Potassium chloride) .... One tab by mouth once daily, two tabs on mon, wed, fri 11)  Allopurinol 100 Mg Tabs (Allopurinol) .... Take 1 tablet by mouth once a day 12)  Diovan 80 Mg Tabs (Valsartan) .... Take 1 tablet by mouth once a day 13)  Septra Ds 800-160 Mg Tabs (Sulfamethoxazole-trimethoprim) .... Take 1 two times a day for 5 days  Other Orders: Re-certification of Home Health [phone])

## 2010-04-27 NOTE — Assessment & Plan Note (Signed)
Summary: RECERT   Allergies: No Known Drug Allergies   Complete Medication List: 1)  Flonase 50 Mcg/act Susp (Fluticasone propionate) .Marland Kitchen.. 1 spray once daily 2)  Simvastatin 20 Mg Tabs (Simvastatin) .... Take 1 tab by mouth at bedtime 3)  Furosemide 20 Mg Tabs (Furosemide) .Marland Kitchen.. 1 by mouth once daily 4)  Tylenol Arthritis Pain 650 Mg Tbcr (Acetaminophen) .... As needed 5)  Colace 100 Mg Caps (Docusate sodium) .... Three caps by mouth qhs 6)  Tramadol Hcl 50 Mg Tabs (Tramadol hcl) .Marland Kitchen.. 1 by mouth four times a day 7)  Alendronate Sodium 70 Mg Tabs (Alendronate sodium) .Marland Kitchen.. 1 by mouth every week 8)  Cvs Oyster Shell Calcium-vit D 500-200 Mg-unit Tabs (Calcium carbonate-vitamin d) .... One tab by mouth tid 9)  Metoprolol Tartrate 50 Mg Tabs (Metoprolol tartrate) .... One tabs by mouth bid 10)  Klor-con 10 10 Meq Cr-tabs (Potassium chloride) .... One tab by mouth once daily, two tabs on mon, wed, fri 11)  Allopurinol 100 Mg Tabs (Allopurinol) .... Take 1 tablet by mouth once a day 12)  Diovan 80 Mg Tabs (Valsartan) .... Take 1 tablet by mouth once a day 13)  Septra Ds 800-160 Mg Tabs (Sulfamethoxazole-trimethoprim) .... Take 1 two times a day for 5 days  Other Orders: Re-certification of Home Health [phone])

## 2010-04-27 NOTE — Progress Notes (Signed)
Summary: DOES PT NEED CLEARANCE FOR TOOTH EXTRACTION   Phone Note From Other Clinic Call back at [phone]   CallerHassan Buckler CELL [phone] Request: Talk with Nurse, Talk with Provider Summary of Call: PT IS SCHEDULED FOR TOOTH EXTRACTION ON 02/08/10 BUT ALLISON PA AT [facility]  IS NOT SEEING ANY MENTION THAT WE ARE AWARE AND HAVE BEEN CLEARED TO HAVE THIS. NEEDS TO KNOW IF THIS NEEDS RESCHEDULED UNTILL WE CLEAR. THERE IS MENTION OF IV SADATION BUT IS BOOKED AS GENERAL. Initial call taken by: Faythe Ghee,  February 05, 2010 10:39 AM

## 2010-04-27 NOTE — Miscellaneous (Signed)
Summary: Home Care Report  Home Care Report   Imported By: Lind Guest 11/03/2009 14:16:41  _____________________________________________________________________  External Attachment:    Type:   Image     Comment:   External Document

## 2010-04-27 NOTE — Assessment & Plan Note (Signed)
Summary: MEDICAL CLEARANCE   Vital Signs:  Patient profile:   77 year old female Height:      63 inches Weight:      136 pounds BMI:     24.18 O2 Sat:      97 % Pulse rate:   67 / minute Resp:     16 per minute BP sitting:   160 / 100  (right arm)  Vitals Entered By: Everitt Amber LPN (November 06, 2009 9:21 AM) CC: medical clearance for dental procedure   Primary Care Provider:  Dr. [name]  CC:  medical clearance for dental procedure.  History of Present Illness: Pt in to get medical clearance for dental extraction of 16 teeth with iV sedation. She has valvular heart disease, , heart failure,hypertension and  chromnic anemia, to name a few of her health problems. Caerdiology evaluation and clearnacre is desired and preferable in this pt. Shee c/o joint pain and stifness with chronic deformity of ankles, and knees and markedlt reduced mobility of all joints. She denies any recent fever or chills. She denies head or chest congestion. She denies chest pain or pND, she denies orthopnea. She has malododros urine but denies dysurioa or fequency.  Allergies: No Known Drug Allergies  Past History:  Past Medical History: Atrial fibrillation, paroxysmal Diverticulosis, pancolonic, h/o excessive bleeding and also of diverticulitis Hyperlipidemia Hypertension with severe left ventricular hypertrophy; normal ejection fraction in 04/2005 Valvular heart disease: Mild to moderate aortic stenosis; moderate to severe mitral stenosis in 2007; mild MR Degenerative joint disease Chronic kidney disease-stage II Anemia-iron deficiency, chronic Osteoporosis Left popliteal cyst Hyperkalemia-mild Fracture left wrist approx 2008  Review of Systems      See HPI General:  Complains of fatigue, malaise, and weakness; denies chills, fever, sleep disorder, and sweats. Eyes:  Complains of vision loss-both eyes. ENT:  Denies hoarseness, nasal congestion, sinus pressure, and sore throat. CV:   Complains of shortness of breath with exertion; denies chest pain or discomfort and near fainting. Resp:  Denies cough and sputum productive. GI:  Denies abdominal pain, constipation, diarrhea, nausea, and vomiting; diverticulosis. Derm:  Denies itching and rash. Neuro:  Complains of poor balance, visual disturbances, and weakness. Psych:  Denies anxiety and depression. Endo:  Denies cold intolerance, excessive thirst, excessive urination, and polyuria. Heme:  Denies abnormal bruising and bleeding. Allergy:  Denies hives or rash and itching eyes.  Physical Exam  General:  Well-developed,well-nourished,in no acute distress; alert,appropriate and cooperative throughout examination HEENT: No facial asymmetry,  EOMI, No sinus tenderness, TM's Clear, oropharynx  pink and moist. poor dentition  Chest: Clear to auscultation bilaterally.  CVS: S1, S2, pansystolic murmur, No S3.   Abd: Soft, Nontender.  MS: decreased ROM spine, hips, shoulders and knees.  Ext: No edema.   CNS: CN 2-12 intact, power tone and sensation normal throughout.   Skin: Intact, no visible lesions or rashes.  Psych: Good eye contact, normal affect.  Memory losst, not anxious or depressed appearing. ]   Impression & Recommendations:  Problem # 1:  DISORDER OF BONE AND CARTILAGE UNSPECIFIED (ICD-733.90) Assessment Comment Only  Her updated medication list for this problem includes:    Alendronate Sodium 70 Mg Tabs (Alendronate sodium) .Marland Kitchen... 1 by mouth every week  Orders: T-Vitamin D (25-Hydroxy) ([id number])  Problem # 2:  CYSTITIS, ACUTE (ICD-595.0) Assessment: Comment Only  The following medications were removed from the medication list:    Septra Ds 800-160 Mg Tabs (Sulfamethoxazole-trimethoprim) .Marland Kitchen... Take 1 two times  a day for 5 days  Orders: T-Urinalysis ([id number]) T-Culture, Urine ([id number])  Problem # 3:  PAROXYSMAL ATRIAL FIBRILLATION (ICD-427.31) Assessment: Unchanged  Her updated  medication list for this problem includes:    Metoprolol Tartrate 50 Mg Tabs (Metoprolol tartrate) ..... One tabs by mouth bid  Problem # 4:  HYPERTENSION (ICD-401.9) Assessment: Comment Only  Her updated medication list for this problem includes:    Furosemide 20 Mg Tabs (Furosemide) .Marland Kitchen... 1 by mouth once daily    Metoprolol Tartrate 50 Mg Tabs (Metoprolol tartrate) ..... One tabs by mouth bid    Diovan 80 Mg Tabs (Valsartan) .Marland Kitchen... Take 1 tablet by mouth once a day  Orders: CXR- 2view (CXR) EKG w/ Interpretation ([address]), no acute ischemic changes T-Basic Metabolic Panel ([id number]) Cardiology Referral (Cardiology)needs card to clear for procedure  BP today: 160/100 Prior BP: 110/60 (09/25/2009)  Labs Reviewed: K+: 5.3 (07/21/2009) Creat: : 2.27 (07/21/2009)   Chol: 186 (01/12/2009)   HDL: 67 (01/12/2009)   LDL: 105 (01/12/2009)   TG: 69 (01/12/2009)  Problem # 5:  ARTHRITIS (ICD-716.90) Assessment: Unchanged  Complete Medication List: 1)  Flonase 50 Mcg/act Susp (Fluticasone propionate) .Marland Kitchen.. 1 spray once daily 2)  Simvastatin 20 Mg Tabs (Simvastatin) .... Take 1 tab by mouth at bedtime 3)  Furosemide 20 Mg Tabs (Furosemide) .Marland Kitchen.. 1 by mouth once daily 4)  Tylenol Arthritis Pain 650 Mg Tbcr (Acetaminophen) .... As needed 5)  Colace 100 Mg Caps (Docusate sodium) .... Three caps by mouth qhs 6)  Tramadol Hcl 50 Mg Tabs (Tramadol hcl) .Marland Kitchen.. 1 by mouth four times a day 7)  Alendronate Sodium 70 Mg Tabs (Alendronate sodium) .Marland Kitchen.. 1 by mouth every week 8)  Cvs Oyster Shell Calcium-vit D 500-200 Mg-unit Tabs (Calcium carbonate-vitamin d) .... One tab by mouth tid 9)  Metoprolol Tartrate 50 Mg Tabs (Metoprolol tartrate) .... One tabs by mouth bid 10)  Klor-con 10 10 Meq Cr-tabs (Potassium chloride) .... One tab by mouth once daily, two tabs on mon, wed, fri 11)  Allopurinol 100 Mg Tabs (Allopurinol) .... Take 1 tablet by mouth once a day 12)  Diovan 80 Mg Tabs (Valsartan) .... Take 1  tablet by mouth once a day  Other Orders: T-Hepatic Function [phone]) T-Lipid Profile [phone]) T-CBC w/Diff [phone])  Patient Instructions: 1)  Please schedule a follow-up appointment in 2 months.CANCEL appt for 11/18/2009 2)  BMP prior to visit, ICD-9: 3)  Hepatic Panel prior to visit, ICD-9: 4)  Lipid Panel prior to visit, ICD-9: 5)  CBC w/ Diff prior to visit, ICD-9:   fasting today,  6)  Vit D 7)  CCUA and reflex c/s pls also 8)  You will be referred [facility] for clearance, and you need a CXR

## 2010-04-27 NOTE — Miscellaneous (Signed)
Summary: Home Care Report  Home Care Report   Imported By: Lind Guest 07/07/2009 09:29:19  _____________________________________________________________________  External Attachment:    Type:   Image     Comment:   External Document

## 2010-04-27 NOTE — Progress Notes (Signed)
Summary: clearance  Phone Note Other Incoming   Caller: dr Barbette Merino Summary of Call: dentist called back, still has not received surgical clearance  [phone] jamie fax: [phone] Initial call taken by: Adella Hare LPN,  December 02, 2009 4:02 PM  Follow-up for Phone Call        pls let them know i am awaiting word from the cardiologist,I  Follow-up by: [name] MD,  December 02, 2009 4:58 PM  Additional Follow-up for Phone Call Additional follow up Details #1::        Dr. Lodema Hong did you get the clearance on this pt. Dr. Dietrich Pates faxed something over Additional Follow-up by: Rudene Anda,  December 04, 2009 1:41 PM    Additional Follow-up for Phone Call Additional follow up Details #2::    pls call and he nurse Francoise Ceo tomorrow Follow-up by: [name] MD,  December 02, 2009 5:03 PM  Additional Follow-up for Phone Call Additional follow up Details #3:: Details for Additional Follow-up Action Taken: I am sending a phone note to dr Dietrich Pates today Additional Follow-up by: [name] MD,  December 07, 2009 10:59 AM

## 2010-04-27 NOTE — Progress Notes (Signed)
Summary: clearance for teeth extraction   Phone Note From Other Clinic   Caller: Provider Call For: Assistance with medical clearance Summary of Call: S: Dr. Lodema Hong asked for your assistance with medical clearace for Mrs. Breed B:pt to have 16 teeth extraced  with IV conscious sedation A: pt last ov 01/20/09, no med changes , pt had labs drawn today for Dr. Lodema Hong, she also asked to please advise for SBE proplylaxis R Initial call taken by: Teressa Lower RN,  November 06, 2009 4:29 PM  Follow-up for Phone Call        No SBE prophylaxis  If sedation utilized, it should be administered by anesthesia. Follow-up by: Kathlen Brunswick, MD, [facility],  November 08, 2009 3:02 PM

## 2010-04-27 NOTE — Letter (Signed)
Summary: REQUEST FOR MEDICAL CLEARANCE  REQUEST FOR MEDICAL CLEARANCE   Imported By: Lind Guest 12/10/2009 14:18:46  _____________________________________________________________________  External Attachment:    Type:   Image     Comment:   External Document

## 2010-04-27 NOTE — Assessment & Plan Note (Signed)
Summary: F1Y  Medications Added METOPROLOL TARTRATE 50 MG TABS (METOPROLOL TARTRATE) Take 1 tablet bid BENEFIBER  POWD (WHEAT DEXTRIN) i teaspoon qam        Visit Type:  Follow-up Primary Provider:  Dr. [name]  CC:  77 yr fu.  History of Present Illness: Judy Grant is a pleasant 77 AAF with known history of moderte AoV stenosis with most recent echocardiogram in 2007 with peak gradient across the AoV of appoximately and the valve area calculated is 1.1 subcutaneously cm.  She also has a history of hypertension, PAF, and hyperlipidemia.  She comes today without complaints.  She denies chest pain, DOE, dizziness or near syncope.  She continues arthritis pain.  Current Medications (verified): 1)  Flonase 50 Mcg/act  Susp (Fluticasone Propionate) .Marland Kitchen.. 1 Spray Once Daily 2)  Simvastatin 20 Mg Tabs (Simvastatin) .... Take 1 Tab By Mouth At Bedtime 3)  Furosemide 20 Mg  Tabs (Furosemide) .Marland Kitchen.. 1 By Mouth Once Daily 4)  Tylenol Arthritis Pain 650 Mg  Tbcr (Acetaminophen) .... As Needed 5)  Tramadol Hcl 50 Mg  Tabs (Tramadol Hcl) .Marland Kitchen.. 1 By Mouth Four Times A Day 6)  Alendronate Sodium 70 Mg  Tabs (Alendronate Sodium) .Marland Kitchen.. 1 By Mouth Every Week 7)  Klor-Con 10 10 Meq Cr-Tabs (Potassium Chloride) .... One Tab By Mouth Once Daily, Two Tabs On Mon, Wed, Fri 8)  Allopurinol 100 Mg Tabs (Allopurinol) .... Take 1 Tablet By Mouth Once A Day 9)  Diovan 80 Mg Tabs (Valsartan) .... Take 1 Tablet By Mouth Once A Day 10)  Metoprolol Tartrate 50 Mg Tabs (Metoprolol Tartrate) .... Take 1 Tablet Bid 11)  Oscal 500/200 D-3 500-200 Mg-Unit Tabs (Calcium-Vitamin D) .... Take 1 Tablet By Mouth Three Times A Day 12)  Colace 100 Mg Caps (Docusate Sodium) .... 3 Capsules At Bedtime 13)  Benefiber  Powd (Wheat Dextrin) .... I Teaspoon Qam  Allergies: No Known Drug Allergies  Comments:  Nurse/Medical Assistant: brought med bottkes patient states all meds are being took correctly per what bottles  states except metoprolol 50 mg is being took 1 tab two times a day  nit at 1 1/2 tabs two times a day.  Past History:  Past medical, surgical, family and social histories (including risk factors) reviewed, and no changes noted (except as noted below).  Past Medical History: Reviewed history from 11/06/2009 and no changes required. Atrial fibrillation, paroxysmal Diverticulosis, pancolonic, h/o excessive bleeding and also of diverticulitis Hyperlipidemia Hypertension with severe left ventricular hypertrophy; normal ejection fraction in 04/2005 Valvular heart disease: Mild to moderate aortic stenosis; moderate to severe mitral stenosis in 2007; mild MR Degenerative joint disease Chronic kidney disease-stage II Anemia-iron deficiency, chronic Osteoporosis Left popliteal cyst Hyperkalemia-mild Fracture left wrist approx 2008  Past Surgical History: Reviewed history from 04/06/2007 and no changes required. Appendectomy-1944 Total hip replacement-1994  Family History: Reviewed history from 11/20/2007 and no changes required. mother-deceased- causeunknown father-deceased cause unknown sister-x1living  Social History: Reviewed history from 11/20/2007 and no changes required. retired Psychiatric nurse, farmer widowed  Former Smoker-quit 42 years age Alcohol use-no Drug use-no 3 children all adopted  Review of Systems       arthirtis pain  All other systems have been reviewed and are negative unless stated above.   Vital Signs:  Patient profile:   77 year old female Weight:      137 pounds BMI:     24.36 Pulse rate:   85 / minute BP sitting:  143 / 67  (right arm)  Vitals Entered By: Dreama Saa, CNA (January 22, 2010 11:11 AM)  Physical Exam  General:  Well developed, well nourished, in no acute distress. Lungs:  Clear bilaterally to auscultation and percussion. Heart:  Non-displaced PMI, chest non-tender; regular rate and rhythm, S1, S2 without murmurs, rubs or  gallops. 3/6 systolic murmur best heard at the right sternal boarder.  Radiation to the carotids is noted. Abdomen:  Bowel sounds positive; abdomen soft and non-tender without masses, organomegaly, or hernias noted. No hepatosplenomegaly. Msk:  Back normal, normal gait. Muscle strength and tone normal. Pulses:  pulses normal in all 4 extremities Extremities:  trace left pedal edema and 1+ right pedal edema.   Neurologic:  Alert and oriented x 3. Psych:  Normal affect.   EKG  Procedure date:  01/22/2010  Findings:      Normal sinus rhythm with rate of: 84 bpm  Left ventricular hypertrophy.    Impression & Recommendations:  Problem # 1:  AORTIC STENOSIS, MODERATE (ICD-424.1) Her Aov has not bee assessed since 2007.  I will repeat this to assess level of stenosis.  She is asymptomatic at this time.  No changes in her medications.  Follow-up with Dr. Dietrich Pates in 1 month. Her updated medication list for this problem includes:    Furosemide 20 Mg Tabs (Furosemide) .Marland Kitchen... 1 by mouth once daily    Diovan 80 Mg Tabs (Valsartan) .Marland Kitchen... Take 1 tablet by mouth once a day    Metoprolol Tartrate 50 Mg Tabs (Metoprolol tartrate) .Marland Kitchen... Take 1 tablet bid  Orders: 2-D Echocardiogram (2D Echo)  Problem # 2:  HYPERTENSION (ICD-401.9) May need to increase medications to have better BP control. Compared to prior visit one year ago, BP is a little more improved from 156/82 to 143/67.  Will continue to monitor this and may need to increase dose of diovan if necessary. Her updated medication list for this problem includes:    Furosemide 20 Mg Tabs (Furosemide) .Marland Kitchen... 1 by mouth once daily    Diovan 80 Mg Tabs (Valsartan) .Marland Kitchen... Take 1 tablet by mouth once a day    Metoprolol Tartrate 50 Mg Tabs (Metoprolol tartrate) .Marland Kitchen... Take 1 tablet bid  Patient Instructions: 1)  Your physician recommends that you schedule a follow-up appointment in: 1 month RR 2)  Your physician has requested that you have an  echocardiogram.  Echocardiography is a painless test that uses sound waves to create images of your heart. It provides your doctor with information about the size and shape of your heart and how well your heart's chambers and valves are working.  This procedure takes approximately one hour. There are no restrictions for this procedure.

## 2010-04-27 NOTE — Progress Notes (Signed)
Summary: medicine  Phone Note Call from Patient   Summary of Call: WHERE DID U SEND HER MEDICINE FOR HER UTI CALL BACK AND LET HER KNow Initial call taken by: Lind Guest,  November 11, 2009 1:12 PM  Follow-up for Phone Call        [facility] did not get med, gave order verbally and advised to deliver patient aware Follow-up by: Adella Hare LPN,  November 11, 2009 4:46 PM

## 2010-04-29 NOTE — Miscellaneous (Signed)
Summary: RECERT  RECERT   Imported By: Lind Guest 03/09/2010 09:18:00  _____________________________________________________________________  External Attachment:    Type:   Image     Comment:   External Document

## 2010-04-29 NOTE — Assessment & Plan Note (Signed)
Summary: RECERT   Allergies: No Known Drug Allergies   Complete Medication List: 1)  Flonase 50 Mcg/act Susp (Fluticasone propionate) .Marland Kitchen.. 1 spray once daily 2)  Simvastatin 20 Mg Tabs (Simvastatin) .... Take 1 tab by mouth at bedtime 3)  Furosemide 20 Mg Tabs (Furosemide) .Marland Kitchen.. 1 by mouth once daily 4)  Tylenol Arthritis Pain 650 Mg Tbcr (Acetaminophen) .... As needed 5)  Tramadol Hcl 50 Mg Tabs (Tramadol hcl) .... Takes as needed 6)  Alendronate Sodium 70 Mg Tabs (Alendronate sodium) .Marland Kitchen.. 1 by mouth every week 7)  Klor-con 10 10 Meq Cr-tabs (Potassium chloride) .... One tab by mouth once daily, two tabs on mon, wed, fri 8)  Allopurinol 100 Mg Tabs (Allopurinol) .... Take 1 tablet by mouth once a day 9)  Diovan 80 Mg Tabs (Valsartan) .... Take 1 tablet by mouth once a day 10)  Metoprolol Tartrate 50 Mg Tabs (Metoprolol tartrate) .... Take 1 1/2 in am and 1 in pm 11)  Oscal 500/200 D-3 500-200 Mg-unit Tabs (Calcium-vitamin d) .... Take 1 tablet by mouth three times a day 12)  Colace 100 Mg Caps (Docusate sodium) .... 3 capsules at bedtime 13)  Benefiber Powd (Wheat dextrin) .... I teaspoon qam  Other Orders: Re-certification of Home Health [id number])   Orders Added: 1)  Re-certification of Home Health [G0179]

## 2010-05-03 ENCOUNTER — Encounter: Payer: Self-pay | Admitting: Family Medicine

## 2010-05-05 NOTE — Assessment & Plan Note (Signed)
Summary: F UP   Vital Signs:  Patient profile:   77 year old female Height:      63 inches Weight:      137.75 pounds BMI:     24.49 O2 Sat:      97 % Pulse rate:   78 / minute Pulse rhythm:   irregular Resp:     16 per minute BP sitting:   124 / 62  (left arm) Cuff size:   regular  Vitals Entered By: Everitt Amber LPN (April 21, 2010 9:29 AM) CC: Follow up chronic problems, knees have been hurting her. Also has some spots she wants looked at on her right side   Primary Care Provider:  Dr. [name]  CC:  Follow up chronic problems and knees have been hurting her. Also has some spots she wants looked at on her right side.  History of Present Illness: Reports  that  she has been doing fairly well, her joint pains are her major complaint. he is in the process of fitting dentures, but this is proving to be challenging Denies recent fever or chills. Denies sinus pressure, nasal congestion , ear pain or sore throat. Denies chest congestion, or cough productive of sputum. Denies chest pain, palpitations, PND, orthopnea or leg swelling. Denies abdominal pain, nausea, vomitting, diarrhea or constipation. Denies change in bowel movements or bloody stool. Denies dysuria , frequency, incontinence or hesitancy.  Denies headaches, vertigo, seizures. Denies depression, anxiety or insomnia. Denies  rash, lesions, or itch.     Current Medications (verified): 1)  Flonase 50 Mcg/act  Susp (Fluticasone Propionate) .Marland Kitchen.. 1 Spray Once Daily 2)  Simvastatin 20 Mg Tabs (Simvastatin) .... Take 1 Tab By Mouth At Bedtime 3)  Furosemide 20 Mg  Tabs (Furosemide) .Marland Kitchen.. 1 By Mouth Once Daily 4)  Tylenol Arthritis Pain 650 Mg  Tbcr (Acetaminophen) .... As Needed 5)  Tramadol Hcl 50 Mg  Tabs (Tramadol Hcl) .... Takes As Needed 6)  Alendronate Sodium 70 Mg  Tabs (Alendronate Sodium) .Marland Kitchen.. 1 By Mouth Every Week 7)  Klor-Con 10 10 Meq Cr-Tabs (Potassium Chloride) .... One Tab By Mouth Once Daily,  Two Tabs On Mon, Wed, Fri 8)  Allopurinol 100 Mg Tabs (Allopurinol) .... Take 1 Tablet By Mouth Once A Day 9)  Diovan 80 Mg Tabs (Valsartan) .... Take 1 Tablet By Mouth Once A Day 10)  Metoprolol Tartrate 50 Mg Tabs (Metoprolol Tartrate) .... Take 1 1/2 in Am and 1 in Pm 11)  Oscal 500/200 D-3 500-200 Mg-Unit Tabs (Calcium-Vitamin D) .... Take 1 Tablet By Mouth Three Times A Day 12)  Colace 100 Mg Caps (Docusate Sodium) .... 3 Capsules At Bedtime 13)  Benefiber  Powd (Wheat Dextrin) .... I Teaspoon Qam 14)  Miralax  Powd (Polyethylene Glycol 3350) .... As Needed  Allergies (verified): No Known Drug Allergies  Review of Systems      See HPI General:  Complains of fatigue. Eyes:  Complains of vision loss-both eyes. MS:  Complains of joint pain and stiffness; bilateral kneee pain an stiffness left greater than right. Endo:  Denies cold intolerance, excessive hunger, excessive thirst, and excessive urination. Heme:  Denies abnormal bruising and bleeding. Allergy:  Denies hives or rash and itching eyes.  Physical Exam  General:  Well-developed,well-nourished,in no acute distress; alert,appropriate and cooperative throughout examination HEENT: No facial asymmetry,  EOMI, No sinus tenderness, TM's Clear, oropharynx  pink and moist. poor dentition  Chest: Clear to auscultation bilaterally.  CVS: S1, S2, pansystolic  murmur, No S3.   Abd: Soft, Nontender.  MS: decreased ROM spine, hips, shoulders and knees.  Ext: No edema.   CNS: CN 2-12 intact, power tone and sensation normal throughout.   Skin: Intact, no visible lesions or rashes.  Psych: Good eye contact, normal affect.  Memory intact, not anxious or depressed appearing. ]   Impression & Recommendations:  Problem # 1:  HYPERTENSION (ICD-401.9) Assessment Improved  Her updated medication list for this problem includes:    Furosemide 20 Mg Tabs (Furosemide) .Marland Kitchen... 1 by mouth once daily    Diovan 80 Mg Tabs (Valsartan) .Marland Kitchen... Take 1  tablet by mouth once a day    Metoprolol Tartrate 50 Mg Tabs (Metoprolol tartrate) .Marland Kitchen... Take 1 1/2 in am and 1 in pm  Orders: T-Basic Metabolic Panel ([id number])  BP today: 124/62 Prior BP: 151/85 (02/23/2010)  Labs Reviewed: K+: 5.5 (03/25/2010) Creat: : 1.49 (03/25/2010)   Chol: 170 (03/25/2010)   HDL: 60 (03/25/2010)   LDL: 95 (03/25/2010)   TG: 76 (03/25/2010)  Problem # 2:  HYPERLIPIDEMIA (ICD-272.4) Assessment: Unchanged  Her updated medication list for this problem includes:    Simvastatin 20 Mg Tabs (Simvastatin) .Marland Kitchen... Take 1 tab by mouth at bedtime  Orders: Medicare Electronic Prescription [phone])  Labs Reviewed: SGOT: 14 (03/25/2010)   SGPT: <8 U/L (03/25/2010)   HDL:60 (03/25/2010), 52 (11/06/2009)  LDL:95 (03/25/2010), 93 (11/06/2009)  Chol:170 (03/25/2010), 167 (11/06/2009)  Trig:76 (03/25/2010), 110 (11/06/2009)  Problem # 3:  ARTHRITIS (ICD-716.90) Assessment: Deteriorated  Orders: Depo- Medrol 40mg  (J1030) Ketorolac-Toradol 15mg  (O9629) Admin of Therapeutic Inj  intramuscular or subcutaneous (52841)  Complete Medication List: 1)  Flonase 50 Mcg/act Susp (Fluticasone propionate) .Marland Kitchen.. 1 spray once daily 2)  Simvastatin 20 Mg Tabs (Simvastatin) .... Take 1 tab by mouth at bedtime 3)  Furosemide 20 Mg Tabs (Furosemide) .Marland Kitchen.. 1 by mouth once daily 4)  Tylenol Arthritis Pain 650 Mg Tbcr (Acetaminophen) .... As needed 5)  Tramadol Hcl 50 Mg Tabs (Tramadol hcl) .... Takes as needed 6)  Alendronate Sodium 70 Mg Tabs (Alendronate sodium) .Marland Kitchen.. 1 by mouth every week 7)  Klor-con 10 10 Meq Cr-tabs (Potassium chloride) .... One tab by mouth once daily, two tabs on mon, wed, fri 8)  Allopurinol 100 Mg Tabs (Allopurinol) .... Take 1 tablet by mouth once a day 9)  Diovan 80 Mg Tabs (Valsartan) .... Take 1 tablet by mouth once a day 10)  Metoprolol Tartrate 50 Mg Tabs (Metoprolol tartrate) .... Take 1 1/2 in am and 1 in pm 11)  Oscal 500/200 D-3 500-200 Mg-unit Tabs  (Calcium-vitamin d) .... Take 1 tablet by mouth three times a day 12)  Colace 100 Mg Caps (Docusate sodium) .... 3 capsules at bedtime 13)  Benefiber Powd (Wheat dextrin) .... I teaspoon qam 14)  Miralax Powd (Polyethylene glycol 3350) .... As needed  Other Orders: T-Uric Acid (Blood) ([id number])  Patient Instructions: 1)  Please schedule a follow-up appointment in 4 months. 2)  No changes in your meds. 3)  Pls ensure you drink more water. 4)  Your meds will remain the same. 5)  Injections today for arthritis pain, Toradol 30mg  and depomedrol 40mg IM 6)  Uric acid, chem 7 non fasting in 4 months   Medication Administration  Injection # 1:    Medication: Depo- Medrol 40mg     Diagnosis: ARTHRITIS (ICD-716.90)    Route: IM    Site: RUOQ gluteus    Exp Date: 01/13    Lot #: [id number] [id number]  Mfr: Pharmacia    Patient tolerated injection without complications    Given by: Adella Hare LPN (April 21, 2010 10:32 AM)  Injection # 2:    Medication: Ketorolac-Toradol 15mg     Diagnosis: ARTHRITIS (ICD-716.90)    Route: IM    Site: LUOQ gluteus    Exp Date: 03/29/2011    Lot #: [id number]    Mfr: [email or web address]    Comments: toradol 30mg  given    Patient tolerated injection without complications    Given by: Adella Hare LPN (April 21, 2010 10:33 AM)  Orders Added: 1)  Est. Patient Level IV [13244] 2)  Medicare Electronic Prescription [G8553] 3)  T-Basic Metabolic Panel [[id number]] 4)  T-Uric Acid (Blood) [[id number]] 5)  Depo- Medrol 40mg  [J1030] 6)  Ketorolac-Toradol 15mg  [J1885] 7)  Admin of Therapeutic Inj  intramuscular or subcutaneous [96372]     Medication Administration  Injection # 1:    Medication: Depo- Medrol 40mg     Diagnosis: ARTHRITIS (ICD-716.90)    Route: IM    Site: RUOQ gluteus    Exp Date: 01/13    Lot #: [id number] [id number]    Mfr: Pharmacia    Patient tolerated injection without complications    Given by: Adella Hare LPN (April 21, 2010 10:32 AM)  Injection #  2:    Medication: Ketorolac-Toradol 15mg     Diagnosis: ARTHRITIS (ICD-716.90)    Route: IM    Site: LUOQ gluteus    Exp Date: 03/29/2011    Lot #: [id number]    Mfr: [email or web address]    Comments: toradol 30mg  given    Patient tolerated injection without complications    Given by: Adella Hare LPN (April 21, 2010 10:33 AM)  Orders Added: 1)  Est. Patient Level IV [25956] 2)  Medicare Electronic Prescription [G8553] 3)  T-Basic Metabolic Panel [[id number]] 4)  T-Uric Acid (Blood) [[id number]] 5)  Depo- Medrol 40mg  [J1030] 6)  Ketorolac-Toradol 15mg  [J1885] 7)  Admin of Therapeutic Inj  intramuscular or subcutaneous [51884]

## 2010-05-13 NOTE — Miscellaneous (Signed)
Summary: recert  recert   Imported By: Lind Guest 05/03/2010 16:40:33  _____________________________________________________________________  External Attachment:    Type:   Image     Comment:   External Document

## 2010-05-24 ENCOUNTER — Telehealth: Payer: Self-pay | Admitting: Family Medicine

## 2010-06-03 NOTE — Progress Notes (Signed)
Summary: papers ready  Phone Note Call from Patient   Summary of Call: aid called and wanted to know is her paper ready for [facility] on aging call back at [phone] Initial call taken by: Lind Guest,  May 24, 2010 10:43 AM  Follow-up for Phone Call        i do not have a form for this patient Follow-up by: Adella Hare LPN,  May 25, 2010 1:29 PM  Additional Follow-up for Phone Call Additional follow up Details #1::        needs to be resent I have noo papers on her either Additional Follow-up by: [name] MD,  May 25, 2010 1:41 PM    Additional Follow-up for Phone Call Additional follow up Details #2::    patient aware Follow-up by: Adella Hare LPN,  May 25, 2010 1:45 PM

## 2010-06-08 LAB — BASIC METABOLIC PANEL
BUN: 26 mg/dL — ABNORMAL HIGH (ref 6–23)
Glucose, Bld: 86 mg/dL (ref 70–99)
Potassium: 5 mEq/L (ref 3.5–5.1)
Sodium: 137 mEq/L (ref 135–145)

## 2010-06-08 LAB — CBC
MCHC: 31.7 g/dL (ref 30.0–36.0)
RDW: 13.2 % (ref 11.5–15.5)
WBC: 6.3 10*3/uL (ref 4.0–10.5)

## 2010-06-15 ENCOUNTER — Other Ambulatory Visit: Payer: Self-pay | Admitting: Family Medicine

## 2010-06-19 ENCOUNTER — Other Ambulatory Visit: Payer: Self-pay | Admitting: Family Medicine

## 2010-07-03 ENCOUNTER — Other Ambulatory Visit: Payer: Self-pay | Admitting: Family Medicine

## 2010-07-13 DIAGNOSIS — I4891 Unspecified atrial fibrillation: Secondary | ICD-10-CM

## 2010-07-13 DIAGNOSIS — I10 Essential (primary) hypertension: Secondary | ICD-10-CM

## 2010-07-18 ENCOUNTER — Other Ambulatory Visit: Payer: Self-pay | Admitting: Family Medicine

## 2010-08-02 ENCOUNTER — Encounter: Payer: Self-pay | Admitting: Family Medicine

## 2010-08-02 ENCOUNTER — Other Ambulatory Visit: Payer: Self-pay | Admitting: Adult Health

## 2010-08-03 ENCOUNTER — Encounter: Payer: Self-pay | Admitting: Family Medicine

## 2010-08-03 ENCOUNTER — Telehealth: Payer: Self-pay | Admitting: Family Medicine

## 2010-08-03 ENCOUNTER — Ambulatory Visit (INDEPENDENT_AMBULATORY_CARE_PROVIDER_SITE_OTHER): Payer: PRIVATE HEALTH INSURANCE | Admitting: Family Medicine

## 2010-08-03 DIAGNOSIS — I10 Essential (primary) hypertension: Secondary | ICD-10-CM

## 2010-08-03 DIAGNOSIS — E785 Hyperlipidemia, unspecified: Secondary | ICD-10-CM

## 2010-08-03 DIAGNOSIS — M129 Arthropathy, unspecified: Secondary | ICD-10-CM

## 2010-08-03 DIAGNOSIS — R609 Edema, unspecified: Secondary | ICD-10-CM

## 2010-08-03 DIAGNOSIS — M109 Gout, unspecified: Secondary | ICD-10-CM

## 2010-08-03 DIAGNOSIS — M810 Age-related osteoporosis without current pathological fracture: Secondary | ICD-10-CM

## 2010-08-03 NOTE — Assessment & Plan Note (Signed)
Deteriorated, referral to orthopedics, conservative management as much as is feasible is in her best interest. I assured pt should she need surgery, social services would become involved to ensure Bernard's safety

## 2010-08-03 NOTE — Assessment & Plan Note (Signed)
Controlled, no change in medication  

## 2010-08-03 NOTE — Patient Instructions (Signed)
F/u in 3 months.  You will be referred to Dr Hilda Lias about your right shoulder  And left knee.  Pls  Buy a blender to have your food crushed so that you can eat   No med changes  At this time.   Be careful not  To fall!

## 2010-08-03 NOTE — Progress Notes (Signed)
  Subjective:    Patient ID: Judy Grant, female    DOB: 12/06/1917, 77 y.o.   MRN: [id number]  HPI The PT is here for follow up and re-evaluation of chronic medical conditions, medication management and review of recent lab and radiology data.  Preventive health is updated, specifically  Cancer screening,  and Immunization.    The PT denies any adverse reactions to current medications since the last visit.  There are no new concerns.  She c/o increased right shoulder pain and left knee pain with instability. She has not fallen. She is concerned about the care of her adopted son, in the event that she needs hospitalization. She c/o inability to eat due to lack of teeth, advised getting a blender to crush her food     Review of Systems Denies recent fever or chills. Denies sinus pressure, nasal congestion, ear pain or sore throat. Denies chest congestion, productive cough or wheezing. Denies chest pains, palpitations, paroxysmal nocturnal dyspnea, orthopnea and leg swelling Denies abdominal pain, nausea, vomiting,diarrhea or constipation.  Denies rectal bleeding or change in bowel movement. Denies dysuria, frequency, hesitancy or incontinence. . Denies headaches, seizure, numbness, or tingling. Denies depression, anxiety or insomnia. Denies skin break down or rash.        Objective:   Physical Exam Patient alert and oriented and in no Cardiopulmonary distress.  HEENT: No facial asymmetry, EOMI, no sinus tenderness, TM's clear, Oropharynx pink and moist.No teeth  Neck supple no adenopathy.  Chest: Clear to auscultation bilaterally.  CVS: S1, S2 systolic murmur, no S3.  ABD: Soft non tender. Bowel sounds normal.  Ext: No edema  MS: decreased  ROM spine, shoulders, hips and knees.Marked deformity of knees and ankles  Skin: Intact, no ulcerations or rash noted.  Psych: Good eye contact, normal affect. Memory mild loss, not anxious or depressed appearing.  CNS: CN  2-12 intact, power, tone and sensation normal throughout.        Assessment & Plan:

## 2010-08-04 MED ORDER — OYSTER SHELL CALCIUM/D 500-200 MG-UNIT PO TABS: 500.0000 mg | ORAL_TABLET | Freq: Three times a day (TID) | ORAL | Status: DC

## 2010-08-04 MED ORDER — SIMVASTATIN 20 MG PO TABS: 20.0000 mg | ORAL_TABLET | Freq: Every day | ORAL | Status: DC

## 2010-08-04 MED ORDER — DIOVAN 80 MG PO TABS: 80.0000 mg | ORAL_TABLET | Freq: Every day | ORAL | Status: DC

## 2010-08-04 MED ORDER — ALENDRONATE SODIUM 70 MG PO TABS: 70.0000 mg | ORAL_TABLET | ORAL | Status: DC

## 2010-08-04 MED ORDER — ALLOPURINOL 100 MG PO TABS: 100.0000 mg | ORAL_TABLET | Freq: Every day | ORAL | Status: DC

## 2010-08-04 MED ORDER — FUROSEMIDE 20 MG PO TABS: 20.0000 mg | ORAL_TABLET | Freq: Every day | ORAL | Status: DC

## 2010-08-04 MED ORDER — TRAMADOL HCL 50 MG PO TABS: 50.0000 mg | ORAL_TABLET | Freq: Four times a day (QID) | ORAL | Status: DC | PRN

## 2010-08-04 NOTE — Progress Notes (Signed)
Addended by: Adella Hare on: 08/04/2010 08:40 AM   Modules accepted: Orders, Medications

## 2010-08-10 NOTE — Assessment & Plan Note (Signed)
NAME:  Judy Grant, Judy Grant            CHART#:  [id number]   DATE:  09/20/2006                       DOB:  March 26, 1918   REFERRING PHYSICIAN:  Milus Mallick. Lodema Hong, M.D.   PROBLEM LIST:  1. 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  2. Atrial fibrillation.  3. Anemia.  4. Hypertension.  5. Hyperlipidemia.  6. Gastritis.  7. Diverticulosis.  8. Constipation   SUBJECTIVE:  [name] is an 77 year old female who presents as a  return patient visit.  She is no longer having black stools.  Her bowels  are moving pretty good.  She still occasionally uses Goody powders for  her headaches.  She is using MiraLax 1-2x a week for her constipation.   OBJECTIVE:  VITALS:  Weight 140 pounds (down 4 pounds since her last  visit in 04/2006).  Temperature 98.6.  Blood pressure 130/70, pulse 72,  LUNGS: are clear to auscultation bilaterally.  CARDIOVASCULAR:  Regular  rhythm. murmur present.  ABDOMEN:  Bowel sounds present, nontender,  nondistended.  No rebound or guarding.   ASSESSMENT:  [name] is an 77 year old female with a history of a GI  bleed and no evidence of active bleeding.  Her ferritin was last  measured in 04/2006 at 55.  Her last hemoglobin was 10.5 in 04/2006.  She has anemia of chronic disease. Thank you for allowing me to see Ms.  Marcantel in consultation.  My recommendations to follow.   RECOMMENDATIONS:  1. Will check a CBC today.  2. She may follow-up with me as needed.  Will call her with results of      her CBC.      Kassie Mends, M.D.  Electronically Signed    SM/MEDQ  D:  09/20/2006  T:  09/21/2006  Job:  [id number]   cc:   Milus Mallick. Lodema Hong, M.D.

## 2010-08-10 NOTE — Letter (Signed)
November 09, 2007    Judy Grant. Judy Hong, MD  [address]  [place], [place] [address]   RE:  Judy, Grant  MRN:  [id number]  /  DOB:  Oct 29, 1917   Dear Judy Grant,   Judy Grant returns to the office for continued assessment and treatment  of paroxysmal atrial fibrillation, valvular heart disease, and multiple  cardiovascular risk factors without known vascular disease.  Since her  last visit, she has done quite well.  She does light housework without  much difficulty, but does describe symptoms compatible with class II-III  dyspnea on exertion.  She has had no lightheadedness and certainly no  syncope.  She experiences no chest discomfort.  She is somewhat unclear  about her medications.  As best we can tell, she is taking simvastatin  10 mg daily, OsCal 500 mg daily, KCl 20 mEq daily, Metanx 1 daily,  furosemide 20 mg daily, metoprolol 75 mg b.i.d., Diovan 160 mg daily,  and Fosamax 70 mg q. week.   On exam, trim, older woman, in no acute distress.  The weight is 136,  stable.  Blood pressure 125/70, heart rate 60 and regular, and  respirations 14.  Neck:  No jugular venous distention; faint transmitted  murmur bilaterally.  Lungs:  Clear.  Cardiac:  Normal first heart  sounds; reduced, but present aortic component in the second heart  sounds; grade 3/6 buzzing early to mid peaking systolic ejection murmur  at the cardiac base.  Abdomen:  Soft and nontender; no organomegaly.  Extremities:  Edema 1+ on the left;:  1/2+ on the right; distal pulses  intact.   Most recent laboratories from April at which time, the potassium was  5.3, there is mild normocytic anemia with a hemoglobin of 10.7 and stage  III chronic kidney disease with a creatinine of 1.37, which is stable.   IMPRESSION:  Judy Grant is doing well overall.  Control of hypertension  is excellent.  She has no symptoms nor findings to suggest recurrent  atrial fibrillation.  She has aortic stenosis that is  moderate by exam,  consistent with a previous echocardiogram.  This does not appear to be  significantly symptomatic at the present time.  Chronic kidney disease  is stable.  She has mild hyperkalemia for which we will reduce her dose  of potassium to 10 mEq daily and repeat a metabolic profile in 1 month.  I will see this nice woman again in 9 months.    Sincerely,      Judy Friends. Dietrich Pates, MD, [facility]  Electronically Signed    RMR/MedQ  DD: 11/09/2007  DT: 11/10/2007  Job #: [id number]

## 2010-08-10 NOTE — Letter (Signed)
January 12, 2007    Milus Mallick. Lodema Hong, M.D.  [address], [address]  [place], [place] [address]   RE:  MIKENNA, BUNKLEY  MRN:  [id number]  /  DOB:  June 29, 1917   Dear [name]:   Ms. Pasqual returns to the office for continued assessment and treatment  of hypertension, paroxysmal atrial fibrillation, and aortic valve  disease.  Since I last saw her 6 months ago, she has done quite well.  She notes a decrease in her chronic pedal edema.  She has had no dyspnea  nor chest discomfort.  There has been no light-headedness and certainly  no syncope.  She has had no apparent recurrence of GI bleeding since  warfarin was stopped, and has a normal CBC as of last month.  She has  minimal chronic kidney disease with a creatinine of 1.3 measured in  September.  Lipid status was quite good.   A venous ultrasound obtained immediately after her last visit in April  showed no DVT but a left popliteal cyst was present.  This appears to be  asymptomatic.   CURRENT MEDICATIONS:  1. Simvastatin 10 mg nightly.  2. Os-Cal 500 mg daily.  3. KCl 20 mEq daily.  4. Metanx 1 daily.  5. Furosemide 20 mg daily.  6. Metoprolol 75 mg b.i.d.  7. Diovan 160 mg daily.  8. Fosamax 70 mg q. week.   EXAM:  Very pleasant, sharp older woman.  The weight is 136, 6 pounds less than in April.  Blood pressure 115/65,  heart rate 80 and irregular.  Respirations 16.  NECK:  No jugular venous distension; faint transmitted murmur  bilaterally.  LUNGS:  Clear.  CARDIAC:  Grade 2/6 to 3/6 mid-peaking systolic ejection murmur with an  absent aortic component of the second heart sound.  ABDOMEN:  Soft and nontender; no organomegaly.  EXTREMITIES:  1/2+ ankle edema.   EKG:  The EKG shows sinus rhythm with PVCs.   IMPRESSION:  Ms. Wynia is doing beautifully.  She will continue her  current medications.  I will leave further laboratory testing to your  discretion, and plan to see this nice woman again in 9 months,  sooner  should she develop any symptoms related to her moderate aortic stenosis.    Sincerely,      Gerrit Friends. Dietrich Pates, MD, [facility]  Electronically Signed    RMR/MedQ  DD: 01/12/2007  DT: 01/13/2007  Job #: [id number]   CC:    Kassie Mends, M.D.

## 2010-08-13 ENCOUNTER — Ambulatory Visit: Payer: Self-pay | Admitting: Cardiology

## 2010-08-13 NOTE — H&P (Signed)
[facility]  Patient:    Judy Grant, Judy Grant Visit Number: [id number] MRN: [id number]          Service Type: MED Location: 2A P710 01 Attending Physician:  Annamarie Dawley Dictated by:   Elpidio Anis, M.D. Admit Date:  09/10/2001                           History and Physical  HISTORY OF PRESENT ILLNESS:  An 77 year old female who presents with painless rectal bleeding.  Patient states that she had some bleeding on the late evening of 6/14 which was Saturday.  The bleeding was very brisk initially and then it stopped.  On Sunday during the day, she did fine without bleeding. However, Sunday evening and throughout the night, she had multiple episodes of bleeding.  She presented to the emergency room with a complaint of GI bleeding.  She was found to have a hemoglobin of 7 and also to have atrial fibrillation.  She is being admitted and has been started on transfusions. She is tolerating transfusion well.  She states that each bowel movement that she has now consists mostly of blood.  She has not had rectal bleeding in the past.  PAST MEDICAL HISTORY:  She has hypertension and osteoarthritis.  Her heart arrhythmia is new and she has not had this in the past.  MEDICATIONS: 1. Celebrex 200 mg q.d. 2. Klor-Con 10 mEq q.d. 3. Covera HS 240 mg q.d. 4. Hydrochlorothiazide 25 mg q.d.  ALLERGIES:  She has no known drug allergies.  PAST SURGICAL HISTORY:  Appendectomy and right hip replacement many years ago.  PHYSICAL EXAMINATION:  GENERAL:  She is a pleasant female in no acute distress.  VITAL SIGNS:  Blood pressure 116/62, pulse 85, respirations 20, temperature 98.4.  HEENT:  Unremarkable.  NECK:  Supple.  No JVD or bruit.  CHEST:  Grade 2 to 3 systolic ejection murmur, very irregular irregular heart rhythm.  ABDOMEN:  Soft, nondistended, nontender.  No masses.  EXTREMITIES:  Operative changes of right hip, otherwise unremarkable.   No cyanosis or clubbing.  NEUROLOGIC:  No focal motor, sensory, or cerebellar deficit.  IMPRESSION: 1. Acute gastrointestinal bleeding, etiology to be determined, most likely due    to lower gastrointestinal bleeding but with history of Celebrex use, must    also consider gastric ulcer or duodenal ulcer. 2. New onset paroxysmal atrial fibrillation, presently under control. 3. Hypertension. 4. Osteoarthritis.  PLAN:  Patient will have laxatives and enemas tonight and in the morning. Will proceed with esophagogastroduodenoscopy and colonoscopy in the morning. Dictated by:   Elpidio Anis, M.D. Attending Physician:  Annamarie Dawley DD:  09/10/01 TD:  09/10/01 Job: [id number]

## 2010-08-13 NOTE — Letter (Signed)
May 10, 2006    Milus Mallick. Lodema Hong, M.D.  [address], [address]  [place], [place] [address]   RE:  Judy, Grant  MRN:  [id number]  /  DOB:  05/25/1917   Dear [name],   Judy Grant returns to the office for continued assessment and treatment  of hypertension and valvular heart disease.  She has a history of atrial  fibrillation, but I have no documentation that says arrhythmia since  2003.  Warfarin was discontinued a few months ago due to anemia and  apparent GI blood loss.  Evaluation by Dr. Kassie Mends showed gastritis,  presumed related to aspirin ingestion in the form of Goody's Powders.  The hemoglobin remains depressed, but has stabilized at a value of 10.5.  We provided the patient with stool Hemoccult cards, but these were never  returned.  She feels fairly well.  She has chronic class 2 dyspnea on  exertion.  She has had no chest discomfort.  She has had orthostatic  lightheadedness.  On one recent occasion she slumped to the couch when  she felt dizzy.   PHYSICAL EXAMINATION:  GENERAL:  A somewhat frail but pleasant older  woman, in no acute distress.  VITAL SIGNS:  Weight is 141 pounds, 4 pounds less than in October.  Blood pressure 140/80 lying and 125/65 standing, heart rate 68 with  prematures, respirations 16.  NECK:  No jugular venous distention.  No transmitted murmur.  Normal  carotid upstroke.  LUNGS:  A few left basilar rales that clear with cough.  HEART:  Harsh grade 3/6 basilar systolic ejection murmur that is mid to  late peaking.  Absent aortic component of the second heart sound.  Harsh  systolic murmur at the apex that is probably the same murmur as the  base.  ABDOMEN:  Soft, nontender.  No organomegaly.  EXTREMITIES:  With 1/2+ ankle edema.   RHYTHM STRIP:  Normal sinus rhythm at a rate of 65 with multiple  premature supraventricular depolarizations.   An echocardiogram performed late last year revealed moderate aortic  stenosis.  There was severe mitral annular calcification, which produced  a degree of mitral stenosis that was not adequately evaluated on that  study.   IMPRESSION:  Judy Grant is doing well at the present time.  She is on  two diuretics and is having symptoms of orthostatic hypotension.  We  will reduce the hydrochlorothiazide to 12.5 mg q.d.  You may wish to  completely discontinue that medication at some point.  I have no recent  electrolytes, but assume that these are being monitored.  She has no  known coronary disease, so treatment with low-dose Simvastatin is  reasonable.   I will plan to reassess this nice woman again in three months.    Sincerely,      Gerrit Friends. Dietrich Pates, MD, [facility]  Electronically Signed    RMR/MedQ  DD: 05/10/2006  DT: 05/10/2006  Job #: [id number]   CC:    Kassie Mends, M.D.

## 2010-08-13 NOTE — Op Note (Signed)
NAME:  Judy Grant, Judy Grant NO.:  [id number]   MEDICAL RECORD NO.:  [id number]          PATIENT TYPE:  AMB   LOCATION:  DAY                           FACILITY:  [facility]   PHYSICIAN:  Kassie Mends, M.D.      DATE OF BIRTH:  1918/01/05   DATE OF PROCEDURE:  02/08/2006  DATE OF DISCHARGE:  02/08/2006                                 OPERATIVE REPORT   REFERRING PHYSICIAN:  Milus Mallick. Lodema Hong, M.D.   REASON FOR REFERRAL:  An 77 year old female with atrial fibrillation on  Coumadin, found to have hemoglobin of 8.3.  She reports oozing from her gums  while on Coumadin.  She reports black tarry stools while intermittently  using Goody Powders after her Coumadin was stopped and it was discovered  that her hemoglobin was 8.3.  She received two units of packed red blood  cells in October 2007.  She had an endoscopy performed in November of 2007.  Her EGD with biopsy of the antrum showed chronic active gastritis without  evidence of H. Pylori infection.  Her colonoscopy showed a small adenomatous  polyp on colonoscopy and no source for her profound anemia was identified.  Ferritin was 47 on February 06, 2006.   PROCEDURE DATA:  Height 66 inches, weight 145 pounds, waist 33 inches, build  normal, gastric passage time 42 minutes, small bowel passage time 2 hours  and 29 minutes.   PROCEDURE INFO AND FINDINGS:  Occasional erythematous lesions seen on the  gastric images consistent with findings on the EGD.  Normal  small bowel.  No masses, AVM's, erythema or ulcerations seen.  Visualization of the small  bowel mucosa excellent.  Limited views of the colonic mucosa due to retained  stool.   SUMMARY AND RECOMMENDATIONS:  No source for profound anemia identified.  Perhaps a GI blood loss may be related to concomitant use of NSAIDs and  Coumadin.  No high risk lesion has been identified in the GI tract.  Perhaps  she had a Dieulafoy's lesion or an NSAID ulcer that has healed.  Perhaps  she  had chronic oozing from her chronic active gastritis.  She should continue  her p.o. iron and have serial CBC's.  She should avoid anti-  inflammatory drugs.  She should continue on a PPI once daily for three  months.  It is not necessary to continue her PPI if she is not going to  continue to use anti-inflammatory drugs.  She should return to see Dr. Cira Servant  in three months.      Kassie Mends, M.D.  Electronically Signed     SM/MEDQ  D:  02/14/2006  T:  02/14/2006  Job:  [id number]   cc:   Milus Mallick. Lodema Hong, M.D.  Fax: [phone]   Farris Has. Dorethea Clan, MD  Fax: [phone]

## 2010-08-13 NOTE — Discharge Summary (Signed)
NAME:  Judy Grant, HAPP NO.:  [id number]   MEDICAL RECORD NO.:  [id number]                   PATIENT TYPE:  REC   LOCATION:  REH                                  FACILITY:  [facility]   PHYSICIAN:  Milus Mallick. Lodema Hong, M.D.           DATE OF BIRTH:  1917-04-05   DATE OF ADMISSION:  09/10/2001  DATE OF DISCHARGE:  09/16/2001                                 DISCHARGE SUMMARY   DISCHARGE DIAGNOSES:  1. Rectal bleeding secondary pancolonic diverticulosis.  2. Anemia requiring transfusion secondary to #1.  3. Acute gastritis.  4. New onset atrial fibrillation.  5. Hypertension.  6. Degenerative joint disease.  7. Acute gastritis.  8. Hyperlipidemia.  Her lipid panel, which was done prior to her admission     in May 2003, showed a total cholesterol of 209 with an LDL of 128 and     triglycerides of 80 and an HDL of 85.   HISTORY OF PRESENT ILLNESS:  In summary, the patient is an 77 year old  African American female who presented with a three-day history of bright red  rectal bleeding with progressive weakness.  The patient reported that  approximately a week prior to her admission she was in her normal state of  health.  She does state, however, that over the past one week she had felt  increased fatigue with minimal activity.  She denied any visible rectal  bleeding or any black tarry stools.  She also denied hematemesis.  The  patient denied any chest pain, palpitations, or lightheadedness.  She does  have a past history of hemorrhoids more than 15 years ago.   The patient reported that she took Celebrex one tablet daily for her  degenerative joint disease and reports that on the day prior to her  admission she had a Goody's powder and did not take the Celebrex.  She  states that she did not come to the hospital before because she was just  watching to see if her symptoms would abate.   PAST MEDICAL HISTORY:  Hypertension and degenerative joint disease.   The  patient's most recent outpatient complete blood count done Aug 13, 2001  showed a hemoglobin of 12.1.  Her chemistries showed a BUN of 20 with a  creatinine of 1.2.  In February 2001, she did have a colonoscopy done by Dr.  Katrinka Blazing which showed mild chronic gastritis, as well as diverticulosis and  hemorrhoid disease.   MEDICATIONS ON ADMISSION:  1. Covera HS 240 mg at bedtime.  2. Hydrochlorothiazide 25 mg daily.  3. Potassium 20 mEq daily.  4. Celebrex 200 mg daily.   ALLERGIES:  None known.   PHYSICAL EXAMINATION:  GENERAL:  On admission, the patient was alert and  oriented x4.  She presented in atrial fibrillation with a rapid ventricular  response, rate of 162.  HEENT:  Mucosa was pale.  Extraocular muscles intact.  NECK:  Supple.  No JVD.  No bruits heard.  CHEST:  Adequate air entry.  CARDIOVASCULAR:  Irregularly irregular heart rate with a systolic ejection  murmur.  BREASTS:  Normal.  No masses, nipple discharge, or axillary nodes noted.  ABDOMEN:  No localized tenderness or guarding.  Bowel sounds present and  increased.  No palpable organomegaly or masses.  RECTAL:  Guaiac-positive stool.  EXTREMITIES:  Negative for calf swelling or ulcers.   LABORATORY DATA:  Admission lab data revealed a hemoglobin of 7.7 with a  hematocrit of 22.4, white cell count of 10,000, platelet count of 190.  PT  13.2 and an INR of 1.1 and PTT 22.  Chemistries on admission:  Sodium 138,  potassium 3.6, chloride 109, CO2 24, glucose 126, BUN 53, creatinine 1.4.  TSH was done in-house and this was normal.   Chest x-ray showed no infiltrate.  No acute chest process.  Prominent  superior mediastinum due to ectopic ___________.  Diffuse tortuosity and  ectasia of the thoracic aorta noted.  Mild scarring of the right lateral  lung base.  No CHF.   HOSPITAL COURSE:  The patient was admitted to the medical floor on the  telemetry unit.  She required 3 units of packed red cells for  resuscitation  and cardiology was consulted to evaluate and assessment the management of  new onset atrial fibrillation.  Initially, she was stable on Cardizem orally  and once she converted to a sinus rhythm she remained stable, in terms of  her heart rate.  She had no evidence of an acute coronary syndrome nor did  she complain of any chest pain.  Her blood pressures remained within normal  range during hospitalization.  On September 10, 2001, while still in atrial  fibrillation, she did have an echocardiogram done, which was read by Dr.  Dietrich Pates as being technically very difficult.  He did state that the left  ventricular function appeared normal and he reported normal left and right  atrial sizes and a suggestion of an intraventricular septal hypertrophy.  It  was recommended by Dr. Daleen Squibb that this be repeated when the patient was in  sinus rhythm and this will be done as an outpatient.  In terms of  gastrointestinal system, the patient had upper endoscopy by Dr. Maggie Schwalbe on  September 01, 2001.  She was noted to have acute gastritis and pancolonic  diverticulosis with hemorrhage primarily from the right colon, since both  old and fresh blood was seen on the right side and only old blood on the  left side.  After resuscitation, the patient's hemoglobin remained stable  and she was able to tolerate a regular diet with no complications prior to  being discharged.  Her lab data at the time of discharge showed a hemoglobin  of 10.8 with a white cell count of 9.5 and a platelet count of 152.  At its  lowest while in-house, her hemoglobin was 7.7.  The patient's condition at  the time of discharge was stable and improved.  She had no evidence of  active bleeding for a 24-hour period prior to being discharged and was  discharged home with followup by her primary M.D. in three days to be  reassessed for repeat bleed, as well as to repeat her blood studies.   DISCHARGE MEDICATIONS: 1. Protonix 40 mg 1  daily.  2. Metoprolol 50 mg 1 twice daily.  3. Zocor 10 mg 1 at bedtime.  4. She was advised to take no aspirin or over-the-counter  pain medication.     No BC Powder and only Tylenol for pain.    FOLLOW UP:  She is to follow up with her primary M.D., Dr. Lodema Hong, on September 16, 2001.  She should have a repeat echocardiogram as an outpatient.                                                Milus Mallick. Lodema Hong, M.D.    MES/MEDQ  D:  12/02/2001  T:  12/03/2001  Job:  [id number]

## 2010-08-13 NOTE — Procedures (Signed)
NAME:  Judy Grant, Judy Grant NO.:  [id number]   MEDICAL RECORD NO.:  [id number]          PATIENT TYPE:  OUT   LOCATION:  RAD                           FACILITY:  [facility]   PHYSICIAN:  Vida Roller, M.D.   DATE OF BIRTH:  1917-05-23   DATE OF PROCEDURE:  DATE OF DISCHARGE:                                  ECHOCARDIOGRAM   PRIMARY:  Dr. Lodema Hong   TODAY'S DATE:  May 11, 2005   TAPE NUMBER:  [id number]   TAPE COUNT:  [id number]   CLINICAL HISTORY:  This is a 77 year old woman with aortic stenosis.  Technical quality of the study is difficult.   M-MODE TRACINGS:  The aorta is 30 mm.   The left atrium is 45 mm.   The septum is 19 mm.   The posterior wall is 18 mm.   Left ventricular diastolic dimension is 32 mm.   Left ventricular systolic dimension is 23 mm.   2-D AND DOPPLER IMAGING:  The left ventricle is normal size. There is  preserved LV systolic function with an estimated ejection fraction of 65-  70%. There is moderate concentric left ventricular hypertrophy with no  obvious wall motion abnormalities.   The right ventricle appears to be top normal in size with preserved systolic  function. There is tricuspid regurgitation but we did not get an adequate  Doppler profile.   Both atria appear to be dilated.   The aortic valve is markedly sclerotic with decreased leaflet movement.  There is a peak gradient across the valve of approximately 34 mmHg and the  valve area calculated by the continuity equation is about 1.1 sq cm  indicative of moderate aortic stenosis. There is trivial insufficiency seen.   The mitral valve is severely calcified. The annulus is very calcified with  fixed leaflets. There is a mean gradient of about 8 mm across the valve. The  Doppler profile is difficult to interpret in this range but appears to show  moderate to severe mitral stenosis. There is mild regurgitation seen.   Tricuspid valve has mild regurgitation.   There is  no pericardial effusion.   ASSESSMENT:  There is severe valvular heart disease here with preserved left  ventricular systolic function. The unfortunate situation is that the quality  of the study is not very good to really differentiate moderate versus severe  valvular stenosis and therefore consideration may be made for further  evaluation including a transesophageal echo.      Vida Roller, M.D.  Electronically Signed     JH/MEDQ  D:  05/11/2005  T:  05/11/2005  Job:  [id number]

## 2010-08-13 NOTE — Group Therapy Note (Signed)
[facility]  Patient:    PATTE, WINKEL Visit Number: [id number] MRN: [id number]          Service Type: MED Location: 2A A212 01 Attending Physician:  [name] Dictated by:   [name], M.D. Proc. Date: 03/16/01 Admit Date:  09/10/2001 Discharge Date: 09/14/2001                               Progress Note  SUBJECTIVE:  The patient reports that she feels well. She reports seeing dark stool this morning, however, no dark red bleeding on the commode. She denies being lightheaded. She denies abdominal pain. She denies hematemesis or nausea.  OBJECTIVE:  GENERAL:  Elderly female lying flat in no cardiopulmonary distress.  VITAL SIGNS:  Temperature 97.1, heart rate 72 sinus rhythm, respirations 20, blood pressure 130/70.  CHEST:  Clear to auscultation.  CARDIOVASCULAR:  Heart sounds 1 and 2, no murmurs or S3.  ABDOMEN:  Soft, nontender, positive bowel sounds.  EXTREMITIES:  No edema or ulcers.  LABORATORY DATA:  Her CBC is stable, her hemoglobin this morning is 10.8, white cell count 9.5, and platelets 152.  ASSESSMENT AND PLAN: 1. Acute lower gastrointestinal bleed is secondary to severe diverticulosis--stable. The patient can be discharged home today if okay    with cardiology. 2. New onset paroxysmal atrial fibrillation in sinus rhythm x48 hours. 3. Hypertension adequately controlled. 4. No change in her current medications. 5. If discharged today, she will follow up in the office in three days for    repeat H&H and follow up with [place]. Dictated by:   [name], M.D. Attending Physician:  [name] DD:  09/14/01 TD:  09/15/01 Job: [id number]

## 2010-08-13 NOTE — Group Therapy Note (Signed)
[facility]  Patient:    Judy Grant, Judy Grant Visit Number: [id number] MRN: [id number]          Service Type: MED Location: 2A Y782 01 Attending Physician:  Annamarie Dawley Dictated by:   [name], M.D. Proc. Date: 09/11/01 Admit Date:  09/10/2001                               Progress Note  SUBJECTIVE:  The patient reports that she feels somewhat better.  She has been on strict bedrest so she is not aware of her current exercise tolerance.  She denies any chest pain.  She has had soapsuds enema this morning in preparation for both upper and lower endoscopies today.  OBJECTIVE:  This is an elderly female in no cardiopulmonary distress.  The patient has been in sinus rhythm overnight.  Her vital signs are stable.  Her temperature is 96.3, pulse 74, respirations 20, blood pressure 131/65.  HEENT: Exam is significant for pale mucosa with adequate hydration, extraocular muscles intact, no facial asymmetry, oropharynx moist.  Chest exam is clear to auscultation throughout.  No crackles or wheezes heard.  CARDIOVASCULAR: Heart sounds 1 and 2 heard.  Abdomen is soft with no localized tenderness or guarding.  Extremity exam negative for edema or ulcers.  Significant lab data show an improvement in her hemoglobin and hematocrit, status post transfusions x3.  Her lab values today are as follows:  Hemoglobin is 9.1 with a hematocrit of 25.7.  Her TSH was 0.39, which is normal.  Her second set of cardiac enzymes shows a reduction in her troponin I with a normal MB; her MB is 1.4; her troponin I is 0.05.  ASSESSMENT AND PLAN: 1. Acute gastrointestinal bleed likely secondary to diverticulosis clinically    improved.  The patient is for endoscopy studies today. 2. New-onset atrial fibrillation and currently in sinus rhythm, status post    Cardizem orally.  Hypertension adequately controlled on current dosing of    Cardizem, which is 60 mg every six  hours. 3. The patient is for repeat hemoglobin and hematocrit later today and in the    morning, she may require further red cells and type and cross her for same. Dictated by:   [name], M.D. Attending Physician:  Annamarie Dawley DD:  09/11/01 TD:  09/12/01 Job: [id number]

## 2010-08-13 NOTE — Letter (Signed)
January 13, 2006    [name], M.D.  [address]  [place], [place]  [address]   RE:  Judy Grant, Judy Grant  MRN:  [id number]  /  DOB:  1917-09-08   Dear [name]:   As we discussed by telephone, Mrs. Edmundson is seen in the office following  recently noted moderately severe anemia.  She required transfusion of 2  units of packed red blood cells.  She subsequently has more energy, but has  noted mild pedal edema.   She previously has been characterized as having moderate aortic stenosis and  moderate mitral stenosis.  By my review of her studies, aortic stenosis is  on the mild side.  There is inadequate information to evaluate the  importance of her mitral stenosis.  She is essentially asymptomatic at the  present time.  She reports no dyspnea and no chest discomfort.  She has no  orthopnea or PND.   CURRENT MEDICATIONS:  1. Simvastatin 10 mg daily.  2. HCTZ 25 mg daily.  3. Os-Cal 500 mg daily.  4. Diovan 80 mg b.i.d.  5. KCL 20 mEq daily.  6. Furosemide 10 mg daily.  7. Metoprolol 75 mg b.i.d.  8. Warfarin as directed.   Recent INR was 1.6.   EXAM:  GENERAL:  Pleasant older woman in no acute distress.  VITAL SIGNS:  The weight is 145, 11 pounds less than in February.  Blood  pressure 145/75, heart rate 74.  NECK:  No jugular venous distention; normal carotid upstrokes.  LUNGS:  Few coarse breath sounds at the left base.  CARDIAC:  Grade 3/6 systolic ejection murmur at the cardiac base with  preserved aortic component in the second heart sound.  There is a systolic murmur at the apex which could be a second murmur or  transmission from the systolic ejection murmur.  No diastolic murmur  appreciated.  No opening snap.  ABDOMEN:  Soft and nontender; normal bowel sounds; no organomegaly.  EXTREMITIES:  1+ pretibial edema; distal pulses decreased but present.   IMPRESSION:  Mrs. Ange is doing well with respect to valvular heart  disease.   Pedal edema does not appear to reflect congestive heart failure.  Current medication appears appropriate.  I have no documentation of recent  atrial fibrillation.   Her anemia is of concern.  Although she has known diverticular disease, she  has no gross hematochezia and no melena.  We will provide her with cards for  hemoccult testing.  A repeat CBC following her second unit of blood and iron  studies will be obtained.  I will hold warfarin pending clarification of the  cause for her anemia.   Thanks so much for agreeing to see her next week.  Her vaccinations are up-  to-date.  I will plan to see this nice woman again in 4 months.  Please keep  [place] informed as to the progression of her evaluation for anemia.    Sincerely,      Gerrit Friends. Dietrich Pates, MD, [facility]    RMR/MedQ  DD: 01/13/2006  DT: 01/16/2006  Job #: [id number]

## 2010-08-13 NOTE — Consult Note (Signed)
NAMEMALYIAH, FELLOWS NO.:  [id number]   MEDICAL RECORD NO.:  [id number]          PATIENT TYPE:  AMB   LOCATION:  DAY                           FACILITY:  [facility]   PHYSICIAN:  Kassie Mends, M.D.      DATE OF BIRTH:  Aug 13, 1917   DATE OF CONSULTATION:  01/31/2006  DATE OF DISCHARGE:                                   CONSULTATION   REFERRING PHYSICIAN:  Milus Mallick. Lodema Hong, M.D.   REASON FOR CONSULTATION:  Anemia.   HISTORY OF PRESENT ILLNESS:  Ms. Yankey is an 77 year old female with a  significant past medical history of mitral stenosis and aortic sclerosis.  She reports feeling pretty good except for feeling numbness in her right  hand.  She denies any problems swallowing or heartburn and indigestion.  She  denies any nausea or vomiting.  She has occasional mild abdominal pain.  She  has frequent abdominal pain.  She has frequent constipation for which she  uses Fleet's enemas.  She denies any nosebleeds.  She has frequent oozing  from her gums when she brushes her teeth.  She complains that her stools are  hard when she does have a bowel movement.  She states she had a black runny  stool approximately 1 month ago.  She had a black runny stool approximately  1 week ago.  She denies any aspirin use.  She admits that every once in  awhile she takes Goody's Powder.  She states that Tylenol Arthritis does  not work for her arthritis pain.  She admits to using to Goody's Powders one  to two times a month.  She denies any rectal bleeding.  She has never had  any endoscopy.  She reports a 3-pound weight loss.  She reports never seeing  black, runny stool on Coumadin.   PAST MEDICAL HISTORY:  1. Hypertension.  2. Atrial fibrillation.  3. Osteoarthritis.  4. Hyperlipidemia.  5. Diverticulosis.   PAST SURGICAL HISTORY:  1. Appendectomy.  2. Right hip surgery in [place].   ALLERGIES:  NO KNOWN DRUG ALLERGIES.   MEDICATIONS:  1. Calcium citrate 2 tablets  daily.  2. Tylenol Arthritis.  3. Stool softener 3 pills at night, 100 mg tablets.  4. Lasix 20 mg tablets 1/2 tablet daily.  5. Hydrochlorothiazide 25 mg 1 tablet daily.  6. Diovan 80 mg 1 tablet twice daily.  7. Metoprolol 50 mg 1-1/2 tablets twice daily.  8. Potassium chloride 20 mEq daily.  9. Simvastatin 10 mg q.h.s.  10.Tramadol 50 mg 1 tablet four times a day as needed for pain.  11.Coumadin stopped as of January 13, 2006.  12.Fluticasone 1 spray daily.  13.Ferrous 325 mg tablets daily.  14.Omeprazole 1 tablet twice daily, 20 mg tablets.   FAMILY HISTORY:  Her mother and father deceased from unknown causes.  She  has one living sister and two deceased brothers.   SOCIAL HISTORY:  She is widowed and has one adopted mentally challenged son.  She has a relative who works for an agency that helps care for herself.  She  is a retired Visual merchandiser.  She  [employer].  She denies any tobacco  or alcohol use.   REVIEW OF SYSTEMS:  She denies any difficulty laying flat.  Her review of  systems are per the HPI otherwise all systems are negative.   PHYSICAL EXAM:  VITAL SIGNS:  Weight 147 pounds, height 5 feet 5-1/2 inches,  BMI 24.7 (healthy), temperature 98.4, blood pressure 100/70, pulse 72.  GENERAL:  She is no apparent distress, alert and oriented x4.  HEENT:  Exam is atraumatic, normocephalic.  Pupils equal, react to light.  Mouth no oral lesions.  Posterior pharynx without erythema or exudate.  NECK:  Full range of motion.  No lymphadenopathy.  LUNGS:  Clear to  auscultation bilaterally.  CARDIOVASCULAR:  Regular rhythm with a systolic murmur best heard at the  right upper sternal border.  No S3 or S4.  Midsystolic click.  ABDOMEN:  Bowel sounds are present, soft, nontender, nondistended.  No rebound or  guarding.  No abdominal bruit, no pulsatile mass.  EXTREMITIES:  Trace pedal  edema bilaterally.  She has no cyanosis or clubbing.  NEURO:  She has a wide-based gait,  and mild difficulty getting on and off  the exam table.  She has no focal neurologic deficits.   LABORATORY STUDIES:  From October 2007:  White count 7.4, hemoglobin 8.3,  MCV 85.2, BUN 23, creatinine 1.6, total bili 0.7, alkaline phosphatase 54,  AST 19, ALT 11, albumin 4.2.   ASSESSMENT:  Ms. Mates is an 77 year old female with a normocytic anemia  and chronic renal insufficiency.  She was chronically maintained on Coumadin  for history of chronic atrial fibrillation.  She is currently in normal  sinus rhythm.  She reports seeing two episodes of melena and has been taking  Goody Powders.  The differential diagnosis for her anemia includes peptic  ulcer disease, Dieulafoy's lesion, arteriovenous malformations, and low  likelihood of GI malignancy.  She has never had a colonoscopy and is average  risk for developing colon cancer.  Her constipation is multifactorial and  likely related to the medicines such as her calcium, iron, and her  diverticulosis as well.  She likely has low fiber intake contributing to her  diverticulosis.  Her calcium and her potassium were normal.  Thank you for  allowing me to see Ms. Kiedrowski in consultation.  My recommendations follow.   RECOMMENDATIONS:  1. Ms. Hossain will be scheduled for a colonoscopy followed by EGD as soon      as possible.  Her EGD will be performed for melena and her colonoscopy      will be performed for average risk colon cancer screening.  She is to      take half of her Diovan on the evening before colonoscopy and hold her      Diovan on the morning of colonoscopy.  She is asked to take one      metoprolol on the evening before colonoscopy and one metoprolol on the      morning of her colonoscopy.  She is asked to avoid Goody Powders.  2. For constipation she is asked to add Benefiber in the morning.  She is      asked to add MiraLax at noon time.  She is given samples. 3. After the upper endoscopy is performed, I will decide  whether or not      she needs to continue the omeprazole at 20 mg twice a day.  In an 89-      year-old female  acid suppression may prevent adequate conversion of      p.o. iron to ferric sulfate and thus prevent adequate intestinal      absorption.  Her anemia is normocytic and may also be secondary to      chronic renal insufficiency.  4. She will be scheduled with a follow-up appointment after her EGD and      colonoscopy are complete.      Kassie Mends, M.D.  Electronically Signed     SM/MEDQ  D:  01/31/2006  T:  02/01/2006  Job:  [id number]   cc:   Milus Mallick. Lodema Hong, M.D.  Fax: [phone]

## 2010-08-13 NOTE — Letter (Signed)
July 18, 2006    Judy Grant. Judy Grant, M.D.  [address], [address]  [place], [place] [address]   RE:  Judy Grant, Judy Grant  MRN:  [id number]  /  DOB:  03/02/1918   Dear Judy Grant:   Judy Grant returns to the office to continue assessment and treatment  of hypertension, paroxysmal atrial fibrillation, and mild to moderate  aortic stenosis.  Since her last visit, she has done generally well.  She has noticed some edema in the left ankle.  This has caused her  slight discomfort but has been generally tolerable.  She has had no  dyspnea nor chest discomfort.   CURRENT MEDICATIONS:  1. Simvastatin 10 mg daily.  2. Diovan 80 mg b.i.d.  3. Potassium chloride 20 mEq daily.  4. Furosemide 10 mg daily.  5. Metoprolol 75 mg b.i.d.  6. Hydrochlorothiazide 12.5 mg daily.   PHYSICAL EXAMINATION:  GENERAL:  Pleasant, very sharp older woman in no  acute distress.  VITAL SIGNS:  The weight is 142, stable.  Blood pressure 115/60, heart  rate 80 and regular, respirations 16.  NECK:  No jugular venous distention; minimal bruits versus transmitted  murmur bilaterally.  LUNGS:  Clear.  CARDIAC:  Grade 2-3/6 early to midpeaking systolic ejection murmur;  decreased aortic component in the second heart sound.  ABDOMEN:  Soft and nontender; no organomegaly.  EXTREMITIES:  2+ ankle edema on the left; trace on the right.   IMPRESSION:  Judy Grant is doing generally well.  She has developed  asymmetric pedal edema, probably due to venous disease.  An ultrasound  study will be obtained to rule out DVT.  We will increase her furosemide  to 20 mg daily and discontinue hydrochlorothiazide.  Diovan will be  changed to 160 mg daily for ease of administration.  A CBC and chemistry  profile will be obtained in one month with a return visit in six months.    Sincerely,      Judy Friends. Dietrich Pates, MD, [facility]  Electronically Signed    RMR/MedQ  DD: 07/18/2006  DT: 07/18/2006  Job #: [id number]

## 2010-08-13 NOTE — Op Note (Signed)
NAME:  Judy Grant, Judy Grant NO.:  [id number]   MEDICAL RECORD NO.:  [id number]          PATIENT TYPE:  AMB   LOCATION:  DAY                           FACILITY:  [facility]   PHYSICIAN:  Kassie Mends, M.D.      DATE OF BIRTH:  1917/05/23   DATE OF PROCEDURE:  02/06/2006  DATE OF DISCHARGE:                                 OPERATIVE REPORT   PROCEDURE:  1. Colonoscopy with cold forceps polypectomy.  2. Esophagogastroduodenoscopy with cold forceps biopsy.   INDICATION FOR EXAM:  Ms. [name] is an 77 year old female who presented  with black tarry stool and intermittent use of Goody Powders.  She had a  hemoglobin of 8.3.  She presents for average risk colon cancer screening.   FINDINGS:  1. Ascending colon, descending colon and sigmoid colon diverticulosis.  A      4-mm sessile rectal polyp removed via cold forceps.  Otherwise, no      masses, inflammatory changes or hemorrhoids seen.  2. Gastric atrophy with scattered gastric mucosal nodules seen in the body      and the antrum.  Biopsies obtained via cold forceps.   RECOMMENDATIONS:  1. No source for melena identified.  Schedule capsule endoscopy within the      next seven days to complete evaluation of the GI tract.  2. Office will call the patient with biopsy report.  3. Follow with Dr. Cira Servant in one month.  4. High fiber diet.  Handout given on diverticulosis, polyps, constipation      and high fiber diet.  5. No aspirin or NSAIDs for three days.  She may discontinue the use of      Omeprazole.  Continue iron daily.   MEDICATIONS:  1. Demerol 75 mg IV.  2. Versed 4 mg IV.   PROCEDURE TECHNIQUE:  Physical exam was performed.  Informed consent was  obtained from the patient after explaining the benefits, risks and  alternatives to the procedure.  The patient was connected to the monitor and  placed in the left lateral position.  Continuous oxygen was provided by  nasal cannula and IV medicines administered via  indwelling cannula.  After  administration of sedation and rectal exam, the patient's rectum was  intubated and the scope was advanced under direct visualization to the  cecum.  The scope was subsequently removed slowly by carefully examining the  color, texture, anatomy and integrity of the mucosa on the way out.   After the colonoscopy, the patient's esophagus was intubated with a  diagnostic gastroscope.  The scope was advanced under direct visualization  to the second portion of the duodenum.  The scope was subsequently removed  slowly by carefully examining the color, texture, anatomy and integrity of  the mucosa on the way out.  Biopsies were obtained in the body and antrum of  the stomach using cold forceps.  The patient was recovered in the endoscopy  suite and discharged home in satisfactory condition.      Kassie Mends, M.D.  Electronically Signed     SM/MEDQ  D:  02/06/2006  T:  02/06/2006  Job:  [id number]   cc:   Milus Mallick. Lodema Hong, M.D.  Fax: [phone]

## 2010-08-13 NOTE — Procedures (Signed)
[facility]  Patient:    Judy Grant, Judy Grant Visit Number: [id number] MRN: [id number]          Service Type: MED Location: 2A A212 01 Attending Physician:  [name] Dictated by:   Byhalia Bing, M.D. Proc. Date: 09/13/01 Admit Date:  09/10/2001 Discharge Date: 09/14/2001                              Echocardiograms  REFERRING:  [name], M.D.  CLINICAL DATA:  An 77 year old woman with congestive heart failure.  1. Technically difficult and somewhat limited echocardiographic study. 2. Mild left atrial enlargement, normal right atrium and right ventricle. 3. Moderate aortic valvular sclerosis with mild to moderate impairment in    leaflet mobility.  Substantial annular calcification.  Very mild aortic    stenosis present by Doppler criteria. 4. Normal tricuspid valve. 5. Mitral valve probably normal with moderate annular calcification and    trivial regurgitation. 6. Normal internal dimension of the left ventricle.  Mild to moderate    concentric LVH.  Hyperdynamic regional and global LV systolic function with    cavity obliteration.  Doppler evidence for decreased left ventricular    compliance.  When compared to a prior study of September 10, 2001, technical    quality has improved.  LV systolic function is definitely evaluable on the    current examination.  The increased flow signal demonstrated on the prior    study was due to an intracavitary LV systolic gradient related to    hypertensive heart disease. Dictated by:   [place] Bing, M.D. Attending Physician:  [name] DD:  09/13/01 TD:  09/16/01 Job: [id number]

## 2010-08-13 NOTE — Group Therapy Note (Signed)
[facility]  Patient:    Judy Grant, Judy Grant Visit Number: [id number] MRN: [id number]          Service Type: MED Location: 2A Q595 01 Attending Physician:  Annamarie Dawley Dictated by:   [name], M.D. Proc. Date: 09/13/01 Admit Date:  09/10/2001 Disc. Date: 09/13/01                               Progress Note  BRIEF HISTORY:  The patient currently is improving, however, she does report that this morning she did see bright red rectal blood on defecation.  She denies any abdominal pain and her appetite is poor.  HISTORY OF PRESENT ILLNESS:  Presently elderly female in no cardiopulmonary distress.  Currently in sinus rhythm.  Her vitals signs are stable with a temperature of 97.  Her heart rate is 68, respirations 20, and blood pressure 120/60.  HEENT:  Normal.  CHEST:  Clear to auscultation.  CARDIOVASCULAR:  Heart sounds 1 and 2.  No murmurs, no S3.  ABDOMEN:  Soft, nontender, or rebound.  Positive bowel sounds.  EXTREMITY:  Negative for edema.  CHEMISTRY:  September 13, 2001:  sodium 139, potassium 3.8, chloride 108, CO2 27, BUN 10, creatinine 1.1, and glucose 105.  Calcium is 9.2.  CBC:  Her white cell count is 10.8, hemoglobin 11.1, hematocrit 32.1, and platelets 136.  ASSESSMENT AND PLAN:  Status post acute GI bleed, clinically stable, however, the patient may need to be observed for a further 24 hours in hospital since she continues to have bright red rectal blood.  FINDINGS ON ENDOSCOPY: 1. On September 11, 2001; were acute gastritis and colonic diverticulosis with    hemorrhage. Fresh blood was seen in the ascending colon and fresh clot. 2. New onset atrial fibrillation and hypertension.  The patient is having a    repeat echocardiogram today.  Her atrial fibrillation has converted    to sinus rhythm and her blood pressure is very well controlled on her    current regime.  Will continue same.  She will have repeat CBC in the  morning. Dictated by:   [name], M.D. Attending Physician:  Annamarie Dawley DD:  09/13/01 TD:  09/14/01 Job: [id number]

## 2010-08-16 ENCOUNTER — Ambulatory Visit: Payer: Self-pay | Admitting: Adult Health

## 2010-08-17 ENCOUNTER — Ambulatory Visit (INDEPENDENT_AMBULATORY_CARE_PROVIDER_SITE_OTHER): Payer: PRIVATE HEALTH INSURANCE | Admitting: Adult Health

## 2010-08-17 ENCOUNTER — Encounter: Payer: Self-pay | Admitting: Adult Health

## 2010-08-17 DIAGNOSIS — I359 Nonrheumatic aortic valve disorder, unspecified: Secondary | ICD-10-CM

## 2010-08-17 DIAGNOSIS — I10 Essential (primary) hypertension: Secondary | ICD-10-CM

## 2010-08-17 DIAGNOSIS — I350 Nonrheumatic aortic (valve) stenosis: Secondary | ICD-10-CM

## 2010-08-17 NOTE — Patient Instructions (Signed)
Your physician recommends that you schedule a follow-up appointment in: 6 MONTHS 

## 2010-08-17 NOTE — Progress Notes (Signed)
HPI:  Judy Grant is a 77 y/o AAF patient of Dr. Dietrich Pates who is here for ongoing assessment and treatment of mod-severe AoV stenosis, and hypertension.  Last echo read by Dr. Dietrich Pates 01/31/2010 demonstrated valve area of 1.48 by Vmax, with mean gradient of and a peak gradient of 26 mmHg consistent with mild to moderate stenosis.  On last visit metoprolol was increased to 75mg  in am and 50mg  in pm to assit with HR control and better BP control as her BP was 151/85.  She was followed by home health at that time. She is without cardiac complaint of chest pain, DOE, dizziness. She has chronic arthritis pain in knees and shoulders. No Known Allergies  Current Outpatient Prescriptions  Medication Sig Dispense Refill  . alendronate (FOSAMAX) 70 MG tablet Take 1 tablet (70 mg total) by mouth every 7 (seven) days. Take with a full glass of water on an empty stomach.  4 tablet  3  . allopurinol (ZYLOPRIM) 100 MG tablet Take 1 tablet (100 mg total) by mouth daily.  30 tablet  3  . Calcium Carbonate-Vitamin D (OYSTER SHELL CALCIUM/D) 500-200 MG-UNIT TABS Take 500 mg by mouth 3 (three) times daily.  90 each  3  . DIOVAN 80 MG tablet Take 1 tablet (80 mg total) by mouth daily.  30 each  3  . furosemide (LASIX) 20 MG tablet Take 1 tablet (20 mg total) by mouth daily.  30 tablet  3  . LOPRESSOR 50 MG tablet TAKE 1 AND 1/2 TABLETS BYMOUTH IN THE MORNING AND1 TABLET IN THE EVENING.  75 each  6  . metoprolol (LOPRESSOR) 50 MG tablet Take 50 mg by mouth 2 (two) times daily. One and half tabs in the morning and one tab in the evening       . potassium chloride (KLOR-CON) 10 MEQ CR tablet Take 10 mEq by mouth daily. One tab daily, two tabs on Monday, Wednesday, Friday        . simvastatin (ZOCOR) 20 MG tablet Take 1 tablet (20 mg total) by mouth at bedtime.  30 tablet  3  . traMADol (ULTRAM) 50 MG tablet Take 1 tablet (50 mg total) by mouth 4 (four) times daily as needed.  30 tablet  3    Past Medical History    Diagnosis Date  . Hyperlipidemia   . Osteoporosis   . Anemia   . Gout   . Hypertension   . Mitral stenosis   . Arthritis   . Atrial fibrillation   . Aortic stenosis, mild   . Diverticulosis     Past Surgical History  Procedure Date  . Appendectomy   . Total hip replacement 1994     JWJ:XBJYNW of systems complete and found to be negative unless listed above PHYSICAL EXAM BP 118/61 HR 77 R-17 Wt 130 lbs General: Well developed, well nourished, in no acute distress Head: Eyes PERRLA, No xanthomas.   Normal cephalic and atramatic  Lungs: Clear bilaterally to auscultation and percussion. Heart: RRR with 2/6 systolic murmur with preserved S2. No carotid radiation.  Pulses are 2+ & equal.            No carotid bruit. No JVD.  No abdominal bruits. No femoral bruits. Abdomen: Bowel sounds are positive, abdomen soft and non-tender without masses or                  Hernia's noted. Msk:  Back normal, normal gait. Normal strength and tone for  age. Extremities: No clubbing, cyanosis, non-pitting edema.  DP +1 Neuro: Alert and oriented X 3. Psych:  Good affect, responds appropriately      ASSESSMENT AND PLAN

## 2010-08-17 NOTE — Assessment & Plan Note (Signed)
Very well controlled on current medication regimen.  She is to have lab work drawn by Dr. Lodema Hong in August.  Will defer lab draws now as she wishes to wait for her annual physical with Dr. Lodema Hong.

## 2010-08-17 NOTE — Assessment & Plan Note (Signed)
She is asymptomatic at this time without chest pain or DOE. Review of last echo is documented. Will not repeat echo at this time unless she becomes symptomatic.

## 2010-08-27 NOTE — Telephone Encounter (Signed)
Patient aware.

## 2010-09-01 ENCOUNTER — Ambulatory Visit: Payer: Self-pay | Admitting: Cardiology

## 2010-10-09 ENCOUNTER — Other Ambulatory Visit: Payer: Self-pay | Admitting: Cardiology

## 2010-10-18 ENCOUNTER — Other Ambulatory Visit: Payer: Self-pay

## 2010-10-18 DIAGNOSIS — M129 Arthropathy, unspecified: Secondary | ICD-10-CM

## 2010-10-18 MED ORDER — TRAMADOL HCL 50 MG PO TABS: 50.0000 mg | ORAL_TABLET | Freq: Four times a day (QID) | ORAL | Status: DC | PRN

## 2010-11-03 ENCOUNTER — Encounter: Payer: Self-pay | Admitting: Family Medicine

## 2010-11-04 ENCOUNTER — Ambulatory Visit (INDEPENDENT_AMBULATORY_CARE_PROVIDER_SITE_OTHER): Payer: PRIVATE HEALTH INSURANCE | Admitting: Family Medicine

## 2010-11-04 ENCOUNTER — Encounter: Payer: Self-pay | Admitting: Family Medicine

## 2010-11-04 VITALS — BP 150/80 | HR 76 | Resp 14 | Ht 63.0 in | Wt 128.0 lb

## 2010-11-04 DIAGNOSIS — E785 Hyperlipidemia, unspecified: Secondary | ICD-10-CM

## 2010-11-04 DIAGNOSIS — R5383 Other fatigue: Secondary | ICD-10-CM

## 2010-11-04 DIAGNOSIS — R5381 Other malaise: Secondary | ICD-10-CM

## 2010-11-04 DIAGNOSIS — E860 Dehydration: Secondary | ICD-10-CM

## 2010-11-04 DIAGNOSIS — M129 Arthropathy, unspecified: Secondary | ICD-10-CM

## 2010-11-04 DIAGNOSIS — N3000 Acute cystitis without hematuria: Secondary | ICD-10-CM

## 2010-11-04 DIAGNOSIS — N3090 Cystitis, unspecified without hematuria: Secondary | ICD-10-CM

## 2010-11-04 DIAGNOSIS — I10 Essential (primary) hypertension: Secondary | ICD-10-CM

## 2010-11-04 LAB — URINALYSIS

## 2010-11-04 LAB — BASIC METABOLIC PANEL
CO2: 26 mEq/L (ref 19–32)
Calcium: 10.8 mg/dL — ABNORMAL HIGH (ref 8.4–10.5)
Creat: 1.2 mg/dL — ABNORMAL HIGH (ref 0.50–1.10)
Sodium: 140 mEq/L (ref 135–145)

## 2010-11-04 LAB — CBC WITH DIFFERENTIAL/PLATELET
Basophils Absolute: 0.1 10*3/uL (ref 0.0–0.1)
Basophils Relative: 1 % (ref 0–1)
Hemoglobin: 10.4 g/dL — ABNORMAL LOW (ref 12.0–15.0)
Lymphocytes Relative: 30 % (ref 12–46)
MCHC: 30.9 g/dL (ref 30.0–36.0)
Monocytes Relative: 7 % (ref 3–12)
Neutro Abs: 4 10*3/uL (ref 1.7–7.7)
Neutrophils Relative %: 60 % (ref 43–77)
RDW: 13.8 % (ref 11.5–15.5)
WBC: 6.6 10*3/uL (ref 4.0–10.5)

## 2010-11-04 NOTE — Patient Instructions (Signed)
F/u in 4 weeks.  Labs today since you do not feel well

## 2010-11-05 MED ORDER — CIPROFLOXACIN HCL 500 MG PO TABS: 500.0000 mg | ORAL_TABLET | Freq: Two times a day (BID) | ORAL | Status: AC

## 2010-11-05 NOTE — Assessment & Plan Note (Signed)
No acute flare, unchanged.

## 2010-11-05 NOTE — Assessment & Plan Note (Signed)
Asymptomatic , howevere abnormal uA, hihly suggestive infection, pt c/o malaise will f/u culture and prescribe antibiotics

## 2010-11-05 NOTE — Assessment & Plan Note (Signed)
Controlled when last checked, rept lab in January 2012

## 2010-11-05 NOTE — Assessment & Plan Note (Signed)
Controlled, no change in medication  

## 2010-11-05 NOTE — Progress Notes (Signed)
  Subjective:    Patient ID: Judy Grant, female    DOB: [birth year], 77 y.o.   MRN: [id number]  HPI The PT is here for follow up and re-evaluation of chronic medical conditions, medication management and review of any available recent lab and radiology data.  Preventive health is updated, specifically  Cancer screening and Immunization.   Questions or concerns regarding consultations or procedures which the PT has had in the interim are  addressed. The PT denies any adverse reactions to current medications since the last visit.  Reports just "not feeling well" for the past 3 days. She denies head or chest congestion, she denies fever or chills ,she denies dysuria, frequency or malodorous urine. She c/o medication being mixed up by her nurse and states she has had to go back before to fix it correctly, expects another nurse. Chronic joint pain is unchanged     Review of Systems See HPI . Denies recent fever or chills. Denies sinus pressure, nasal congestion, ear pain or sore throat. Denies chest congestion, productive cough or wheezing. Denies chest pains, palpitations and leg swelling Denies abdominal pain, nausea, vomiting,diarrhea or constipation.   Denies dysuria, frequency, hesitancy or incontinence.  Denies headaches, seizures, numbness, or tingling. Denies depression, anxiety or insomnia. Denies skin break down or rash.      Objective:   Physical Exam  Patient alert and oriented and in no cardiopulmonary distress.  HEENT: No facial asymmetry, EOMI, no sinus tenderness,  oropharynx pink and moist.  Neck supple no adenopathy.  Chest: Clear to auscultation bilaterally.  CVS: S1, S2 ,murmur, no S3.  ABD: Soft non tender. Bowel sounds normal.No suprapubic or renal angle tenderness  Ext: No edema  MS: decreased  ROM spine, shoulders, hips and knees.  Skin: Intact, no ulcerations or rash noted.  Psych: Good eye contact, not anxious or depressed  appearing.  CNS: CN 2-12 intact, power, normal throughout.       Assessment & Plan:

## 2010-11-11 LAB — URINE CULTURE: Colony Count: NO GROWTH

## 2010-12-20 ENCOUNTER — Other Ambulatory Visit: Payer: Self-pay | Admitting: Family Medicine

## 2011-01-03 ENCOUNTER — Other Ambulatory Visit: Payer: Self-pay | Admitting: Family Medicine

## 2011-01-05 ENCOUNTER — Other Ambulatory Visit: Payer: Self-pay | Admitting: Family Medicine

## 2011-01-12 ENCOUNTER — Ambulatory Visit (INDEPENDENT_AMBULATORY_CARE_PROVIDER_SITE_OTHER): Payer: PRIVATE HEALTH INSURANCE

## 2011-01-12 DIAGNOSIS — Z23 Encounter for immunization: Secondary | ICD-10-CM

## 2011-01-15 ENCOUNTER — Other Ambulatory Visit: Payer: Self-pay | Admitting: Family Medicine

## 2011-02-25 ENCOUNTER — Ambulatory Visit: Payer: PRIVATE HEALTH INSURANCE | Admitting: Cardiology

## 2011-03-01 ENCOUNTER — Encounter: Payer: Self-pay | Admitting: Adult Health

## 2011-03-02 ENCOUNTER — Ambulatory Visit: Payer: PRIVATE HEALTH INSURANCE | Admitting: Physician Assistant

## 2011-03-05 ENCOUNTER — Other Ambulatory Visit: Payer: Self-pay | Admitting: Cardiology

## 2011-03-07 ENCOUNTER — Other Ambulatory Visit: Payer: Self-pay | Admitting: Cardiology

## 2011-03-07 ENCOUNTER — Ambulatory Visit (INDEPENDENT_AMBULATORY_CARE_PROVIDER_SITE_OTHER): Payer: PRIVATE HEALTH INSURANCE | Admitting: Adult Health

## 2011-03-07 ENCOUNTER — Encounter: Payer: Self-pay | Admitting: Adult Health

## 2011-03-07 DIAGNOSIS — E782 Mixed hyperlipidemia: Secondary | ICD-10-CM

## 2011-03-07 DIAGNOSIS — I359 Nonrheumatic aortic valve disorder, unspecified: Secondary | ICD-10-CM

## 2011-03-07 DIAGNOSIS — E785 Hyperlipidemia, unspecified: Secondary | ICD-10-CM

## 2011-03-07 DIAGNOSIS — M129 Arthropathy, unspecified: Secondary | ICD-10-CM

## 2011-03-07 DIAGNOSIS — I10 Essential (primary) hypertension: Secondary | ICD-10-CM

## 2011-03-07 NOTE — Patient Instructions (Signed)
Your physician recommends that you schedule a follow-up appointment in: 6 months   Your physician recommends that you return for lab work in: On Thursday

## 2011-03-07 NOTE — Assessment & Plan Note (Signed)
We have called [facility] who supplies her wheelchair as she complains that the battery "broken" and needs to be replaced. They tell us that they have spoken with the patient;s sister and have told her that it will be $150 for a replacement battery. They are waiting on her sister to return the call about this so that it can be brought out and allow her to use the wheel chair again.

## 2011-03-07 NOTE — Progress Notes (Signed)
HPI: Judy Grant is a 77 y/o patient of Dr. Dietrich Grant we are following for ongoing assessment and treatment of moderate AoV stenosis with last echo in 11/11; hypertension and hypercholesterolemia. She comes today without cardiac complaint. She is very concerned about her chronic knee pain and limited ambulation contributing to this. She sees Dr. Hilda Grant regularly and has cortisone injections, most recently last week. She denies chest pain, DOE, dizziness. She is compliant with her medications.  No Known Allergies  Current Outpatient Prescriptions  Medication Sig Dispense Refill  . acetaminophen (TYLENOL) 500 MG tablet Take 500 mg by mouth every 6 (six) hours as needed.        Judy Grant allopurinol (ZYLOPRIM) 100 MG tablet TAKE 1 TABLET BY MOUTH   ONCE A DAY.  30 tablet  3  . Calcium Carbonate-Vitamin D (OYSTER SHELL CALCIUM/D) 500-200 MG-UNIT TABS TAKE (1) TABLET BY MOUTH (3) TIMES DAILY.  90 each  3  . DIOVAN 80 MG tablet TAKE ONE TABLET BY MOUTH ONCE DAILY.  30 each  4  . docusate sodium (COLACE) 50 MG capsule Take by mouth 2 (two) times daily.        . fluticasone (FLONASE) 50 MCG/ACT nasal spray 2 sprays by Nasal route daily.        Judy Grant FOSAMAX 70 MG tablet TAKE 1 TAB EACH WEEK 30  MIN PRIOR TO BREAKFASTWITH LARGE GLASS OF      WATER. REMAIN UPRIGHT.  4 each  3  . KLOR-CON M10 10 MEQ tablet TAKE ONE TABLET BY MOUTH EVERY DAY, EXCEPT TAKE TWO TABLETS ON MONDAY, WEDNESDAY, AND FRIDAY  45 each  3  . LASIX 20 MG tablet TAKE ONE TABLET BY MOUTH ONCE DAILY.  30 each  3  . LOPRESSOR 50 MG tablet TAKE 1 AND 1/2 TABLETS BYMOUTH IN THE MORNING AND1 TABLET IN THE EVENING.  75 each  6  . metoprolol (LOPRESSOR) 50 MG tablet Take 50 mg by mouth 2 (two) times daily. One and half tabs in the morning and one tab in the evening      . traMADol (ULTRAM) 50 MG tablet TAKE (1) TABLET BY MOUTH FOUR TIMES DAILY ASNEEDED FOR PAIN.  120 tablet  3  . ZOCOR 20 MG tablet TAKE (1) TABLET BY MOUTH AT BEDTIME FORCHOLESTEROL.  30  each  3  . DISCONTD: potassium chloride (K-DUR) 10 MEQ tablet TAKE ONE TABLET BY MOUTH EVERY DAY,EXCEPT TWO ON MONDAY,WEDNSDAY,AND FRIDAY.  40 tablet  4    Past Medical History  Diagnosis Date  . Hyperlipidemia   . Osteoporosis   . Anemia   . Gout   . Hypertension   . Mitral stenosis   . Arthritis   . Atrial fibrillation   . Aortic stenosis, mild   . Diverticulosis     Past Surgical History  Procedure Date  . Appendectomy   . Total hip replacement 1994     ROS: Review of systems complete and found to be negative unless listed above PHYSICAL EXAM BP 126/72  Pulse 85  Resp 18  Ht 5\' 3"  (1.6 m)  Wt 124 lb (56.246 kg)  BMI 21.97 kg/m2  General: Well developed, well nourished, in no acute distress Head: Eyes PERRLA, No xanthomas.   Normal cephalic and atramatic  Lungs: Clear bilaterally to auscultation and percussion. Heart: HRRR S1 S2,2-3/6 holosystolic murmur.Radiation to  Carotids. No JVD.  No abdominal bruits. No femoral bruits. Abdomen: Bowel sounds are positive, abdomen soft and non-tender without masses or  Hernia's noted. Msk:  Back normal, normal gait. Normal strength and tone for age. Extremities: No clubbing, cyanosis mild edema left ankle. Difficult ROM of both legs.   DP +1 Neuro: Alert and oriented X 3. Psych:  Good affect, responds appropriately  EKG:NSR with lst degree AV block. LVH is noted.   ASSESSMENT AND PLAN

## 2011-03-07 NOTE — Assessment & Plan Note (Signed)
Currently well controlled. No changes on her medications. She will need labs drawn, a BMET. She is to come on Thursday of this week to have this completed with results to Dr Lodema Hong.

## 2011-03-07 NOTE — Assessment & Plan Note (Signed)
Fasting labs will be completed this week with other lab draw.  The results will be sent to Dr. Lodema Hong as well.

## 2011-03-08 ENCOUNTER — Other Ambulatory Visit: Payer: Self-pay | Admitting: *Deleted

## 2011-03-08 MED ORDER — METOPROLOL TARTRATE 50 MG PO TABS: 50.0000 mg | ORAL_TABLET | Freq: Two times a day (BID) | ORAL | Status: DC

## 2011-03-11 LAB — COMPREHENSIVE METABOLIC PANEL
ALT: <8 U/L (ref 0–35)
Albumin: 3.9 g/dL (ref 3.5–5.2)
CO2: 29 mEq/L (ref 19–32)
Calcium: 10.5 mg/dL (ref 8.4–10.5)
Chloride: 102 mEq/L (ref 96–112)
Creat: 1.19 mg/dL — ABNORMAL HIGH (ref 0.50–1.10)
Potassium: 4.7 mEq/L (ref 3.5–5.3)

## 2011-03-11 LAB — LIPID PANEL
Cholesterol: 163 mg/dL (ref 0–200)
Total CHOL/HDL Ratio: 2.4 Ratio

## 2011-04-01 ENCOUNTER — Telehealth: Payer: Self-pay | Admitting: Family Medicine

## 2011-04-01 ENCOUNTER — Encounter (HOSPITAL_COMMUNITY): Payer: Self-pay | Admitting: *Deleted

## 2011-04-01 ENCOUNTER — Emergency Department (HOSPITAL_COMMUNITY)
Admission: EM | Admit: 2011-04-01 | Discharge: 2011-04-01 | Disposition: A | Discharge status: Home / Self Care | Payer: PRIVATE HEALTH INSURANCE | Attending: Emergency Medicine | Admitting: Emergency Medicine

## 2011-04-01 DIAGNOSIS — M7989 Other specified soft tissue disorders: Secondary | ICD-10-CM | POA: Insufficient documentation

## 2011-04-01 DIAGNOSIS — Z8639 Personal history of other endocrine, nutritional and metabolic disease: Secondary | ICD-10-CM | POA: Insufficient documentation

## 2011-04-01 DIAGNOSIS — E785 Hyperlipidemia, unspecified: Secondary | ICD-10-CM | POA: Insufficient documentation

## 2011-04-01 DIAGNOSIS — Z79899 Other long term (current) drug therapy: Secondary | ICD-10-CM | POA: Insufficient documentation

## 2011-04-01 DIAGNOSIS — I10 Essential (primary) hypertension: Secondary | ICD-10-CM | POA: Insufficient documentation

## 2011-04-01 DIAGNOSIS — Z862 Personal history of diseases of the blood and blood-forming organs and certain disorders involving the immune mechanism: Secondary | ICD-10-CM | POA: Insufficient documentation

## 2011-04-01 DIAGNOSIS — IMO0002 Reserved for concepts with insufficient information to code with codable children: Secondary | ICD-10-CM | POA: Insufficient documentation

## 2011-04-01 DIAGNOSIS — X58XXXA Exposure to other specified factors, initial encounter: Secondary | ICD-10-CM | POA: Insufficient documentation

## 2011-04-01 DIAGNOSIS — M810 Age-related osteoporosis without current pathological fracture: Secondary | ICD-10-CM | POA: Insufficient documentation

## 2011-04-01 DIAGNOSIS — R609 Edema, unspecified: Secondary | ICD-10-CM | POA: Insufficient documentation

## 2011-04-01 DIAGNOSIS — M129 Arthropathy, unspecified: Secondary | ICD-10-CM | POA: Insufficient documentation

## 2011-04-01 LAB — BASIC METABOLIC PANEL
BUN: 20 mg/dL (ref 6–23)
CO2: 28 mEq/L (ref 19–32)
Chloride: 101 mEq/L (ref 96–112)
GFR calc non Af Amer: 39 mL/min — ABNORMAL LOW (ref >90)
Glucose, Bld: 90 mg/dL (ref 70–99)
Potassium: 4.3 mEq/L (ref 3.5–5.1)
Sodium: 136 mEq/L (ref 135–145)

## 2011-04-01 NOTE — Telephone Encounter (Signed)
Spoke with pt. Caregiver Ricci Barker and she stated that the pt feet have been swollen x 2 days and that she had blisters that broke and now are scabbed over.

## 2011-04-01 NOTE — ED Provider Notes (Signed)
History   This chart was scribed for [employer]. Colon Branch, MD scribed by Magnus Sinning. The patient was seen in room APA04/APA04 seen at 17:40.    CSN: [id number]  Arrival date & time 04/01/11  7829   First MD Initiated Contact with Patient 04/01/11 1724      Chief Complaint  Patient presents with  . Leg Swelling    (Consider location/radiation/quality/duration/timing/severity/associated sxs/prior treatment) HPI Judy Grant is a 78 y.o. female who presents to the Emergency Department complaining of gradually worsening intermittent swelling in both her left and right lower extremities that has occurred for over a week with no associated n/v, cough, cold, fever, or chills. Pt describes the swelling as a "feeling of tightness." She reports that she called her PCP and notified him about the leg swelling and that he advised that she report to the ED. Pt reports that she occasionally sits with her legs raised at home and that she does receive a "shot for the fluid" in her knees.   PCP: Dr. Lodema Hong Past Medical History  Diagnosis Date  . Hyperlipidemia   . Osteoporosis   . Anemia   . Gout   . Hypertension   . Mitral stenosis   . Arthritis   . Campath-induced atrial fibrillation   . Aortic stenosis, mild   . Diverticulosis     Past Surgical History  Procedure Date  . Appendectomy   . Total hip replacement 1994     Family History  Problem Relation Age of Onset  . Diabetes Mother     History  Substance Use Topics  . Smoking status: Former Games developer  . Smokeless tobacco: Never Used  . Alcohol Use: No   Review of Systems 10 Systems reviewed and are negative for acute change except as noted in the HPI.  Allergies  Review of patient's allergies indicates no known allergies.  Home Medications   Current Outpatient Rx  Name Route Sig Dispense Refill  . ACETAMINOPHEN 500 MG PO TABS Oral Take 500 mg by mouth every 6 (six) hours as needed.      . ALLOPURINOL 100 MG PO TABS   TAKE 1 TABLET BY MOUTH   ONCE A DAY. 30 tablet 3  . OYSTER SHELL CALCIUM/D 500-200 MG-UNIT PO TABS  TAKE (1) TABLET BY MOUTH (3) TIMES DAILY. 90 each 3  . DIOVAN 80 MG PO TABS  TAKE ONE TABLET BY MOUTH ONCE DAILY. 30 each 4    Dispense as written.  Marland Kitchen DOCUSATE SODIUM 50 MG PO CAPS Oral Take by mouth 2 (two) times daily.      Marland Kitchen FLUTICASONE PROPIONATE 50 MCG/ACT NA SUSP Nasal 2 sprays by Nasal route daily.      Marland Kitchen FOSAMAX 70 MG PO TABS  TAKE 1 TAB EACH WEEK 30  MIN PRIOR TO BREAKFASTWITH LARGE GLASS OF      WATER. REMAIN UPRIGHT. 4 each 3  . KLOR-CON M10 10 MEQ PO TBCR  TAKE ONE TABLET BY MOUTH EVERY DAY, EXCEPT TAKE TWO TABLETS ON MONDAY, WEDNESDAY, AND FRIDAY 45 each 3  . LASIX 20 MG PO TABS  TAKE ONE TABLET BY MOUTH ONCE DAILY. 30 each 3  . METOPROLOL TARTRATE 50 MG PO TABS Oral Take 1 tablet (50 mg total) by mouth 2 (two) times daily. TAKE 1 AND 1/2 TABLETS BYMOUTH IN THE MORNING AND1 TABLET IN THE EVENING. 75 tablet 12  . TRAMADOL HCL 50 MG PO TABS  TAKE (1) TABLET BY MOUTH FOUR TIMES DAILY ASNEEDED  FOR PAIN. 120 tablet 3  . ZOCOR 20 MG PO TABS  TAKE (1) TABLET BY MOUTH AT BEDTIME FORCHOLESTEROL. 30 each 3    BP 163/76  Pulse 94  Temp(Src) 98.2 F (36.8 C) (Oral)  Resp 20  Ht 5\' 3"  (1.6 m)  Wt 134 lb (60.782 kg)  BMI 23.74 kg/m2  SpO2 100%  Physical Exam  Nursing note and vitals reviewed. Constitutional: She is oriented to person, place, and time. She appears well-developed and well-nourished. No distress.  HENT:  Head: Normocephalic and atraumatic.  Eyes: EOM are normal. Pupils are equal, round, and reactive to light.  Neck: Neck supple. No tracheal deviation present.  Cardiovascular: Normal rate and regular rhythm.   Murmur (Systolic murmur) heard. Pulmonary/Chest: Effort normal and breath sounds normal. No respiratory distress.  Abdominal: Soft. Bowel sounds are normal. She exhibits no distension.  Musculoskeletal: Normal range of motion. She exhibits no edema.       1+  pitting Abrasion on the right foot that is scabbed over. No evidence of infection  Abrasion on medial aspect on the left foot that has healed  Neurological: She is alert and oriented to person, place, and time. No sensory deficit.  Skin: Skin is warm and dry.  Psychiatric: She has a normal mood and affect. Her behavior is normal.    ED Course  Procedures (including critical care time) DIAGNOSTIC STUDIES: Oxygen Saturation is 100% on room air, normal by my interpretation.    COORDINATION OF CARE: 18:44: Physician discusses lab results with the patient and family. Physician provides treatment plan options and patient agrees with the plan of action set at this time. Results for orders placed during the hospital encounter of 04/01/11  BASIC METABOLIC PANEL      Component Value Range   Sodium 136  135 - 145 (mEq/L)   Potassium 4.3  3.5 - 5.1 (mEq/L)   Chloride 101  96 - 112 (mEq/L)   CO2 28  19 - 32 (mEq/L)   Glucose, Bld 90  70 - 99 (mg/dL)   BUN 20  6 - 23 (mg/dL)   Creatinine, Ser 8.41 (*) 0.50 - 1.10 (mg/dL)   Calcium 32.4  8.4 - 10.5 (mg/dL)   GFR calc non Af Amer 39 (*) >90 (mL/min)   GFR calc Af Amer 45 (*) >90 (mL/min)    No diagnosis found.    MDM  Patient with increased swelling to her feel over at least a week. She is on Lasix 20 mg daily. Labs with stable creatinine of 1.19. Spoke with family members and patient. Will increase lasix to 30 mg daily for two days and have her follow up with PCP, Dr. Lodema Hong.Pt stable in ED with no significant deterioration in condition.The patient appears reasonably screened and/or stabilized for discharge and I doubt any other medical condition or other [facility] requiring further screening, evaluation, or treatment in the ED at this time prior to discharge.    I personally performed the services described in this documentation, which was scribed in my presence. The recorded information has been reviewed and considered.  MDM Reviewed: nursing  note and vitals Interpretation: labs          Nicoletta Dress. Colon Branch, MD 04/01/11 1857

## 2011-04-01 NOTE — Telephone Encounter (Signed)
Spoke with pt and advised her to go to urgent care or the ed.

## 2011-04-01 NOTE — ED Notes (Signed)
Pt states swelling to lower extremities "for a while" and states she "has been taking shots for the fluid". Pt called PMD today and was told to come to ED. Pt states swelling has been "getting worse each day for a while"

## 2011-04-01 NOTE — Telephone Encounter (Signed)
Advise urgent care or ED today

## 2011-04-22 ENCOUNTER — Other Ambulatory Visit: Payer: Self-pay | Admitting: *Deleted

## 2011-04-22 ENCOUNTER — Telehealth: Payer: Self-pay | Admitting: *Deleted

## 2011-04-22 ENCOUNTER — Telehealth: Payer: Self-pay | Admitting: Family Medicine

## 2011-04-22 DIAGNOSIS — I10 Essential (primary) hypertension: Secondary | ICD-10-CM

## 2011-04-22 MED ORDER — FUROSEMIDE 20 MG PO TABS: ORAL_TABLET | ORAL | Status: DC

## 2011-04-22 NOTE — Telephone Encounter (Signed)
Received phone call from [place], LPN in [place] office stating that Judy Grant had visited the emergency room recently for increased pedal edema and was told to take 30 mg of lasix, which is an increase from her usual 20 mg daily.  States that this has significantly helped with patients symptoms.  Will send new script to pharmacy to cover this change, check BMET first thing next week and follow up with a provider here within the week.  Isabelle Course to call patient with this information.

## 2011-04-26 LAB — BASIC METABOLIC PANEL
BUN: 17 mg/dL (ref 6–23)
CO2: 26 mEq/L (ref 19–32)
Chloride: 101 mEq/L (ref 96–112)
Creat: 1.28 mg/dL — ABNORMAL HIGH (ref 0.50–1.10)
Glucose, Bld: 79 mg/dL (ref 70–99)
Potassium: 4.6 mEq/L (ref 3.5–5.3)

## 2011-04-26 NOTE — Telephone Encounter (Signed)
Noted  

## 2011-04-27 ENCOUNTER — Other Ambulatory Visit: Payer: Self-pay | Admitting: Family Medicine

## 2011-04-27 ENCOUNTER — Encounter: Payer: Self-pay | Admitting: *Deleted

## 2011-04-28 ENCOUNTER — Encounter: Payer: PRIVATE HEALTH INSURANCE | Admitting: Adult Health

## 2011-05-03 ENCOUNTER — Encounter: Payer: Self-pay | Admitting: Adult Health

## 2011-05-03 ENCOUNTER — Ambulatory Visit (INDEPENDENT_AMBULATORY_CARE_PROVIDER_SITE_OTHER): Payer: PRIVATE HEALTH INSURANCE | Admitting: Adult Health

## 2011-05-03 DIAGNOSIS — I10 Essential (primary) hypertension: Secondary | ICD-10-CM

## 2011-05-03 DIAGNOSIS — I350 Nonrheumatic aortic (valve) stenosis: Secondary | ICD-10-CM

## 2011-05-03 DIAGNOSIS — I359 Nonrheumatic aortic valve disorder, unspecified: Secondary | ICD-10-CM

## 2011-05-03 NOTE — Progress Notes (Signed)
HPI: Judy Grant is a pleasant 78 y/o patient of Dr. Dietrich Pates we are following for ongoing assessment and treatment of moderate AoV stenosis (last echo 11/11) hypertension, and hypercholesterolemia. She comes without cardiac complaint today. She has seen Dr. Hilda Lias and had fluid removed from both knees. They are feeling better and she is able to ambulate better. She sometimes uses her wheel chair when her legs are really bothering her. She denies chest pain, palpitations, or DOE.  No Known Allergies  Current Outpatient Prescriptions  Medication Sig Dispense Refill  . acetaminophen (TYLENOL) 500 MG tablet Take 500 mg by mouth every 6 (six) hours as needed.        . Calcium Carbonate-Vitamin D (OYSTER SHELL CALCIUM/D) 500-200 MG-UNIT TABS TAKE (1) TABLET BY MOUTH (3) TIMES DAILY.  90 each  3  . DIOVAN 80 MG tablet TAKE ONE TABLET BY MOUTH ONCE DAILY.  30 each  4  . docusate sodium (COLACE) 50 MG capsule Take by mouth 2 (two) times daily.        . fluticasone (FLONASE) 50 MCG/ACT nasal spray 2 sprays by Nasal route daily.        Marland Kitchen FOSAMAX 70 MG tablet TAKE 1 TAB EACH WEEK 30 MIN PRIOR TO BREAKFAST WITH LARGE GLASS OF WATER. REMAIN UPRIGHT.  4 each  3  . furosemide (LASIX) 20 MG tablet Take 1 1/2 tabs daily (30 mg)  135 tablet  3  . KLOR-CON M10 10 MEQ tablet TAKE ONE TABLET BY MOUTH EVERY DAY, EXCEPT TAKE TWO TABLETS ON MONDAY, WEDNESDAY, AND FRIDAY  45 each  3  . metoprolol (LOPRESSOR) 50 MG tablet Take 1 tablet (50 mg total) by mouth 2 (two) times daily. TAKE 1 AND 1/2 TABLETS BYMOUTH IN THE MORNING AND1 TABLET IN THE EVENING.  75 tablet  12  . traMADol (ULTRAM) 50 MG tablet TAKE (1) TABLET BY MOUTH FOUR TIMES DAILY ASNEEDED FOR PAIN.  120 tablet  3  . ZOCOR 20 MG tablet TAKE (1) TABLET BY MOUTH AT BEDTIME FORCHOLESTEROL.  30 each  3  . DISCONTD: potassium chloride (K-DUR) 10 MEQ tablet TAKE ONE TABLET BY MOUTH EVERY DAY,EXCEPT TWO ON MONDAY,WEDNSDAY,AND FRIDAY.  40 tablet  4    Past Medical  History  Diagnosis Date  . Hyperlipidemia   . Osteoporosis   . Anemia   . Gout   . Hypertension   . Mitral stenosis   . Arthritis   . Atrial fibrillation   . Aortic stenosis, mild   . Diverticulosis     Past Surgical History  Procedure Date  . Appendectomy   . Total hip replacement 1994     ZOX:WRUEAV of systems complete and found to be negative unless listed above  PHYSICAL EXAM BP 137/72  Pulse 78  Resp 16  Ht 5\' 5"  (1.651 m)  Wt 127 lb (57.607 kg)  BMI 21.13 kg/m2  General: Well developed, well nourished, in no acute distress Head: Eyes PERRLA, No xanthomas.   Normal cephalic and atramatic  Lungs: Clear bilaterally to auscultation and percussion. Heart: HRRR S1 S2, harsh 2/6 holosystolic murmur with radiation in to the apex.  Pulses are 2+ & equal.            No carotid bruit. No JVD.  No abdominal bruits. No femoral bruits. Abdomen: Bowel sounds are positive, abdomen soft and non-tender without masses or                  Hernia's noted. Msk:  Back normal, normal gait. Normal strength and tone for age. Extremities: No clubbing, cyanosis , nonpitting edema of the ankles bilaterally.  DP +1 Neuro: Alert and oriented X 3. Psych:  Good affect, responds appropriately    ASSESSMENT AND PLAN

## 2011-05-03 NOTE — Assessment & Plan Note (Signed)
She is asymptomatic with this. Energy level is normal for age. No DOE or chest pain. She will continue on current medications. Will see her again in 6 months unless she is symptomatic. Can repeat ECHO then if necessary.

## 2011-05-03 NOTE — Assessment & Plan Note (Signed)
Currently well controlled on current medications. Will make no changes. Labs are followed by Dr. Lodema Hong.

## 2011-05-03 NOTE — Patient Instructions (Signed)
Your physician recommends that you continue on your current medications as directed. Please refer to the Current Medication list given to you today.  Your physician recommends that you schedule a follow-up appointment in: 6 months  

## 2011-05-06 ENCOUNTER — Other Ambulatory Visit: Payer: Self-pay | Admitting: Family Medicine

## 2011-06-18 ENCOUNTER — Other Ambulatory Visit: Payer: Self-pay | Admitting: Family Medicine

## 2011-07-02 ENCOUNTER — Other Ambulatory Visit: Payer: Self-pay | Admitting: Family Medicine

## 2011-07-04 ENCOUNTER — Other Ambulatory Visit: Payer: Self-pay | Admitting: *Deleted

## 2011-07-04 MED ORDER — POTASSIUM CHLORIDE CRYS ER 10 MEQ PO TBCR: 10.0000 meq | EXTENDED_RELEASE_TABLET | ORAL | Status: DC

## 2011-08-13 ENCOUNTER — Other Ambulatory Visit: Payer: Self-pay | Admitting: Family Medicine

## 2011-09-01 ENCOUNTER — Other Ambulatory Visit: Payer: Self-pay | Admitting: Family Medicine

## 2011-09-05 ENCOUNTER — Encounter: Payer: Self-pay | Admitting: Cardiology

## 2011-09-05 ENCOUNTER — Encounter: Payer: Self-pay | Admitting: *Deleted

## 2011-09-05 ENCOUNTER — Ambulatory Visit (INDEPENDENT_AMBULATORY_CARE_PROVIDER_SITE_OTHER): Payer: PRIVATE HEALTH INSURANCE | Admitting: Cardiology

## 2011-09-05 VITALS — BP 128/70 | HR 78 | Ht 65.0 in | Wt 136.0 lb

## 2011-09-05 DIAGNOSIS — I480 Paroxysmal atrial fibrillation: Secondary | ICD-10-CM | POA: Insufficient documentation

## 2011-09-05 DIAGNOSIS — I38 Endocarditis, valve unspecified: Secondary | ICD-10-CM

## 2011-09-05 DIAGNOSIS — K5790 Diverticulosis of intestine, part unspecified, without perforation or abscess without bleeding: Secondary | ICD-10-CM | POA: Insufficient documentation

## 2011-09-05 DIAGNOSIS — D509 Iron deficiency anemia, unspecified: Secondary | ICD-10-CM

## 2011-09-05 DIAGNOSIS — M129 Arthropathy, unspecified: Secondary | ICD-10-CM

## 2011-09-05 DIAGNOSIS — E785 Hyperlipidemia, unspecified: Secondary | ICD-10-CM | POA: Insufficient documentation

## 2011-09-05 DIAGNOSIS — I4891 Unspecified atrial fibrillation: Secondary | ICD-10-CM

## 2011-09-05 DIAGNOSIS — E782 Mixed hyperlipidemia: Secondary | ICD-10-CM

## 2011-09-05 DIAGNOSIS — I10 Essential (primary) hypertension: Secondary | ICD-10-CM

## 2011-09-05 MED ORDER — FUROSEMIDE 40 MG PO TABS: 40.0000 mg | ORAL_TABLET | Freq: Every day | ORAL | Status: DC

## 2011-09-05 NOTE — Patient Instructions (Signed)
Your physician recommends that you schedule a follow-up appointment in: 6 months  Leg elevation while sedentary  Low sodium diet - attached  Your physician has recommended you make the following change in your medication:  1 - INCREASE lasix to 40 mg daily - New prescription sent in for dose change, however you may finish current bottle by taking 2 tablets daily      until finished.  Your physician recommends that you return for lab work in: 3 weeks (you will receive a letter)

## 2011-09-05 NOTE — Progress Notes (Deleted)
**Note De-Identified  Obfuscation** Name: Judy Grant    DOB: [birth year]  Age: 78 y.o.  MR#: [id number]       PCP:  [name], MD, MD      Insurance: @PAYORNAME @   CC:    Chief Complaint  Patient presents with  . 6 month f/u    Pt. c/o swelling in ankles. Meds+    VS BP 128/70  Pulse 78  Ht 5\' 5"  (1.651 m)  Wt 136 lb (61.689 kg)  BMI 22.63 kg/m2  Weights Current Weight  09/05/11 136 lb (61.689 kg)  05/03/11 127 lb (57.607 kg)  04/01/11 134 lb (60.782 kg)    Blood Pressure  BP Readings from Last 3 Encounters:  09/05/11 128/70  05/03/11 137/72  04/01/11 163/76     Admit date:  (Not on file) Last encounter with RMR:  Visit date not found   Allergy No Known Allergies  Current Outpatient Prescriptions  Medication Sig Dispense Refill  . acetaminophen (TYLENOL) 500 MG tablet Take 500 mg by mouth every 6 (six) hours as needed.        Marland Kitchen allopurinol (ZYLOPRIM) 100 MG tablet Take 100 mg by mouth daily.      . Calcium Carb-Cholecalciferol (OYSTER SHELL CALCIUM + D) 500-400 MG-UNIT TABS Take by mouth 3 (three) times daily.      . calcium-vitamin D (OSCAL WITH D) 500-200 MG-UNIT TABS TAKE (1) TABLET BY MOUTH (3) TIMES DAILY.  90 each  4  . DIOVAN 80 MG tablet TAKE ONE TABLET BY MOUTH ONCE DAILY.  30 each  3  . docusate sodium (COLACE) 50 MG capsule Take by mouth 2 (two) times daily.        . fluticasone (FLONASE) 50 MCG/ACT nasal spray 2 sprays by Nasal route daily.        Marland Kitchen FOSAMAX 70 MG tablet TAKE 1 TAB EACH WEEK 30 MIN PRIOR TO BREAKFAST WITH LARGE GLASS OF WATER. REMAIN UPRIGHT.  4 each  3  . furosemide (LASIX) 20 MG tablet Take 1 1/2 tabs daily (30 mg)  135 tablet  3  . metoprolol (LOPRESSOR) 50 MG tablet Take 1 tablet (50 mg total) by mouth 2 (two) times daily. TAKE 1 AND 1/2 TABLETS BYMOUTH IN THE MORNING AND1 TABLET IN THE EVENING.  75 tablet  12  . polyethylene glycol (MIRALAX / GLYCOLAX) packet Take 17 g by mouth daily.      . potassium chloride (KLOR-CON M10) 10 MEQ tablet Take 1 tablet (10  mEq total) by mouth as directed.  45 tablet  6  . traMADol (ULTRAM) 50 MG tablet TAKE (1) TABLET BY MOUTH FOUR TIMES DAILY AS NEEDED FOR PAIN.  120 tablet  2  . ZOCOR 20 MG tablet TAKE (1) TABLET BY MOUTH AT BEDTIME FOR CHOLESTEROL.  30 each  4  . DISCONTD: potassium chloride (K-DUR) 10 MEQ tablet TAKE ONE TABLET BY MOUTH EVERY DAY,EXCEPT TWO ON MONDAY,WEDNSDAY,AND FRIDAY.  40 tablet  4    Discontinued Meds:   There are no discontinued medications.  Patient Active Problem List  Diagnoses  . ANEMIA-IRON DEFICIENCY  . ARTHRITIS, KNEES, BILATERAL  . Osteoporosis  . Hyperlipidemia  . Hypertension  . Valvular heart disease  . Diverticulosis  . Paroxysmal atrial fibrillation    LABS No visits with results within 3 Month(s) from this visit. Latest known visit with results is:  Orders Only on 04/22/2011  Component Date Value  . Sodium 04/26/2011 140   . Potassium 04/26/2011 4.6   . Chloride **Note De-Identified  Obfuscation** 04/26/2011 101   . CO2 04/26/2011 26   . Glucose, Bld 04/26/2011 79   . BUN 04/26/2011 17   . Creat 04/26/2011 1.28*  . Calcium 04/26/2011 10.4      Results for this Opt Visit:     Results for orders placed in visit on 04/22/11  BASIC METABOLIC PANEL      Component Value Range   Sodium 140  135 - 145 (mEq/L)   Potassium 4.6  3.5 - 5.3 (mEq/L)   Chloride 101  96 - 112 (mEq/L)   CO2 26  19 - 32 (mEq/L)   Glucose, Bld 79  70 - 99 (mg/dL)   BUN 17  6 - 23 (mg/dL)   Creat 1.61 (*) 0.96 - 1.10 (mg/dL)   Calcium 04.5  8.4 - 10.5 (mg/dL)    EKG Orders placed in visit on 03/07/11  . EKG 12-LEAD     Prior Assessment and Plan Problem List as of 09/05/2011          Cardiology Problems   Hyperlipidemia   Hypertension   Valvular heart disease   Paroxysmal atrial fibrillation     Other   ANEMIA-IRON DEFICIENCY   ARTHRITIS, KNEES, BILATERAL   Last Assessment & Plan Note   03/07/2011 Office Visit Signed 03/07/2011  3:07 PM by Jodelle Gross, NP    We have called [facility] who supplies her wheelchair as she complains that the battery "broken" and needs to be replaced. They tell us that they have spoken with the patient;s sister and have told her that it will be $150 for a replacement battery. They are waiting on her sister to return the call about this so that it can be brought out and allow her to use the wheel chair again.    Osteoporosis   Diverticulosis       Imaging: No results found.   FRS Calculation: Score not calculated

## 2011-09-05 NOTE — Assessment & Plan Note (Signed)
She continues to have problems with her knees, and walks with a cane, but symptoms do not appear to be of adequate severity to require evaluation by a specialist.

## 2011-09-05 NOTE — Assessment & Plan Note (Signed)
Patient has had a long-standing mild to moderate anemia, the cause of which is undiagnosed as far as I know.  CBC will be repeated.

## 2011-09-05 NOTE — Progress Notes (Signed)
Patient ID: Judy Grant, female   DOB: [birth year], 78 y.o.   MRN: [id number]  HPI: Scheduled return visit for this lovely older woman with valvular heart disease, almost certainly rheumatic in origin, but no cardiopulmonary symptoms.  She continues to do well, living alone, walking with a cane and not experiencing chest discomfort, dyspnea, lightheadedness or syncope with a basically sedentary lifestyle.  She has noted increasing edema, but does not limit salt intake nor maintain leg elevation.  There's been no compromise of skin integrity, but she has long-standing irritation in the region of the first MTP joint bilaterally.  She has not required hospitalization nor care in the emergency department in some time.  Prior to Admission medications   Medication Sig Start Date End Date Taking? Authorizing Provider  acetaminophen (TYLENOL) 500 MG tablet Take 500 mg by mouth every 6 (six) hours as needed.     Yes Historical Provider, MD  allopurinol (ZYLOPRIM) 100 MG tablet Take 100 mg by mouth daily.   Yes Historical Provider, MD  Calcium Carb-Cholecalciferol (OYSTER SHELL CALCIUM + D) 500-400 MG-UNIT TABS Take by mouth 3 (three) times daily.   Yes Historical Provider, MD  calcium-vitamin D (OSCAL WITH D) 500-200 MG-UNIT TABS TAKE (1) TABLET BY MOUTH (3) TIMES DAILY. 05/06/11  Yes Kerri Perches, MD  DIOVAN 80 MG tablet TAKE ONE TABLET BY MOUTH ONCE DAILY. 06/18/11  Yes Kerri Perches, MD  docusate sodium (COLACE) 50 MG capsule Take by mouth 2 (two) times daily.     Yes Historical Provider, MD  fluticasone (FLONASE) 50 MCG/ACT nasal spray 2 sprays by Nasal route daily.     Yes Historical Provider, MD  FOSAMAX 70 MG tablet TAKE 1 TAB EACH WEEK 30 MIN PRIOR TO BREAKFAST WITH LARGE GLASS OF WATER. REMAIN UPRIGHT. 08/13/11  Yes Kerri Perches, MD  furosemide (LASIX) 40 MG tablet Take 1 tablet (40 mg total) by mouth daily. 09/05/11  Yes Kathlen Brunswick, MD  metoprolol (LOPRESSOR) 50 MG tablet Take  1 tablet (50 mg total) by mouth 2 (two) times daily. TAKE 1 AND 1/2 TABLETS BYMOUTH IN THE MORNING AND1 TABLET IN THE EVENING. 03/08/11  Yes Jonelle Sidle, MD  polyethylene glycol [facility] / GLYCOLAX) packet Take 17 g by mouth daily.   Yes Historical Provider, MD  potassium chloride (KLOR-CON M10) 10 MEQ tablet Take 1 tablet (10 mEq total) by mouth as directed. 07/04/11  Yes Jodelle Gross, NP  traMADol (ULTRAM) 50 MG tablet TAKE (1) TABLET BY MOUTH FOUR TIMES DAILY AS NEEDED FOR PAIN. 07/02/11  Yes Kerri Perches, MD  ZOCOR 20 MG tablet TAKE (1) TABLET BY MOUTH AT BEDTIME FOR CHOLESTEROL. 05/06/11  Yes Kerri Perches, MD  No Known Allergies    Past medical history, social history, and family history reviewed and updated.  ROS: Denies orthopnea, PND, or palpitations.  All other systems reviewed and are negative.  PHYSICAL EXAM: BP 128/70  Pulse 78  Ht 5\' 5"  (1.651 m)  Wt 61.689 kg (136 lb)  BMI 22.63 kg/m2  General-Well developed; no acute distress Body habitus-proportionate weight and height Neck-No JVD; Bilateral carotid bruits vs transmitted murmur Lungs-clear lung fields; resonant to percussion Cardiovascular-normal PMI; normal S1 and nearly normal A2; grade 3/6 harsh early to mid peaking systolic ejection murmur heard best at the base and radiating bilaterally to the carotids Abdomen-normal bowel sounds; soft and non-tender without masses or organomegaly Musculoskeletal-No deformities, no cyanosis or clubbing Neurologic-Normal cranial nerves; symmetric strength  and tone Skin-Warm, no significant lesions Extremities-distal pulses intact; 2+ ankle edema bilaterally  ASSESSMENT AND PLAN:  Judy Bing, MD 09/05/2011 11:45 AM

## 2011-09-05 NOTE — Assessment & Plan Note (Signed)
Excellent control of hyperlipidemia when last assessed 6 months ago. Current therapy will be continued. 

## 2011-09-05 NOTE — Assessment & Plan Note (Signed)
Blood pressures have been borderline at many of her recent office visits, but determination today is excellent.  Current medications will be continued.

## 2011-09-05 NOTE — Assessment & Plan Note (Signed)
Patient remains asymptomatic with respect to valvular heart disease.  Should she develop problems, percutaneous intervention will likely be an option.  We will continue to follow her clinically.

## 2011-09-12 ENCOUNTER — Telehealth: Payer: Self-pay | Admitting: Family Medicine

## 2011-09-12 MED ORDER — DIOVAN 80 MG PO TABS: ORAL_TABLET | ORAL | Status: DC

## 2011-09-12 MED ORDER — TRAMADOL HCL 50 MG PO TABS: ORAL_TABLET | ORAL | Status: DC

## 2011-09-12 MED ORDER — OYSTER SHELL CALCIUM/D 500-200 MG-UNIT PO TABS: ORAL_TABLET | ORAL | Status: DC

## 2011-09-12 MED ORDER — FUROSEMIDE 40 MG PO TABS: 40.0000 mg | ORAL_TABLET | Freq: Every day | ORAL | Status: DC

## 2011-09-12 MED ORDER — METOPROLOL TARTRATE 50 MG PO TABS: ORAL_TABLET | ORAL | Status: DC

## 2011-09-12 MED ORDER — ALENDRONATE SODIUM 70 MG PO TABS: ORAL_TABLET | ORAL | Status: DC

## 2011-09-12 MED ORDER — SIMVASTATIN 20 MG PO TABS: ORAL_TABLET | ORAL | Status: DC

## 2011-09-12 MED ORDER — POTASSIUM CHLORIDE CRYS ER 10 MEQ PO TBCR: 10.0000 meq | EXTENDED_RELEASE_TABLET | ORAL | Status: DC

## 2011-09-12 NOTE — Telephone Encounter (Signed)
Sent in [facility] rx[facility] to [facility]

## 2011-09-12 NOTE — Telephone Encounter (Signed)
Request called in to pharmacy.

## 2011-09-23 ENCOUNTER — Other Ambulatory Visit: Payer: Self-pay | Admitting: Cardiology

## 2011-09-23 LAB — CBC
HCT: 30.7 % — ABNORMAL LOW (ref 36.0–46.0)
MCV: 91.1 fL (ref 78.0–100.0)
RDW: 14.1 % (ref 11.5–15.5)
WBC: 5 10*3/uL (ref 4.0–10.5)

## 2011-09-24 LAB — LIPID PANEL
Cholesterol: 150 mg/dL (ref 0–200)
HDL: 61 mg/dL (ref >39)
Total CHOL/HDL Ratio: 2.5 Ratio
Triglycerides: 78 mg/dL (ref <150)

## 2011-09-24 LAB — COMPREHENSIVE METABOLIC PANEL
AST: 12 U/L (ref 0–37)
BUN: 39 mg/dL — ABNORMAL HIGH (ref 6–23)
CO2: 28 mEq/L (ref 19–32)
Calcium: 10.3 mg/dL (ref 8.4–10.5)
Chloride: 103 mEq/L (ref 96–112)
Creat: 1.9 mg/dL — ABNORMAL HIGH (ref 0.50–1.10)

## 2011-09-30 ENCOUNTER — Encounter: Payer: Self-pay | Admitting: Cardiology

## 2011-09-30 DIAGNOSIS — N189 Chronic kidney disease, unspecified: Secondary | ICD-10-CM | POA: Insufficient documentation

## 2011-10-10 ENCOUNTER — Other Ambulatory Visit: Payer: Self-pay | Admitting: *Deleted

## 2011-10-10 ENCOUNTER — Encounter: Payer: Self-pay | Admitting: *Deleted

## 2011-10-10 DIAGNOSIS — I10 Essential (primary) hypertension: Secondary | ICD-10-CM

## 2011-11-11 ENCOUNTER — Other Ambulatory Visit: Payer: Self-pay | Admitting: Cardiology

## 2011-11-11 LAB — BASIC METABOLIC PANEL
BUN: 26 mg/dL — ABNORMAL HIGH (ref 6–23)
CO2: 27 mEq/L (ref 19–32)
Calcium: 10.3 mg/dL (ref 8.4–10.5)
Creat: 1.29 mg/dL — ABNORMAL HIGH (ref 0.50–1.10)
Glucose, Bld: 78 mg/dL (ref 70–99)

## 2011-11-14 ENCOUNTER — Encounter: Payer: Self-pay | Admitting: *Deleted

## 2011-11-21 ENCOUNTER — Ambulatory Visit (INDEPENDENT_AMBULATORY_CARE_PROVIDER_SITE_OTHER): Payer: PRIVATE HEALTH INSURANCE | Admitting: Adult Health

## 2011-11-21 ENCOUNTER — Encounter: Payer: Self-pay | Admitting: Adult Health

## 2011-11-21 VITALS — BP 120/60 | HR 72 | Ht 64.0 in | Wt 132.2 lb

## 2011-11-21 DIAGNOSIS — I38 Endocarditis, valve unspecified: Secondary | ICD-10-CM

## 2011-11-21 DIAGNOSIS — M810 Age-related osteoporosis without current pathological fracture: Secondary | ICD-10-CM

## 2011-11-21 DIAGNOSIS — I10 Essential (primary) hypertension: Secondary | ICD-10-CM

## 2011-11-21 DIAGNOSIS — M542 Cervicalgia: Secondary | ICD-10-CM

## 2011-11-21 MED ORDER — IBUPROFEN 600 MG PO TABS: 600.0000 mg | ORAL_TABLET | Freq: Four times a day (QID) | ORAL | Status: AC | PRN

## 2011-11-21 NOTE — Patient Instructions (Addendum)
Have a neck x-ray at [facility] as soon as possible.  You can take your Tramadol every 8 hours around the clock for pain.  Start Motrin 600mg  three times per day with food for your neck.  Try over the counter Therma Care patches to neck and shoulder for pain relief.  Your physician wants you to follow-up in: 6 months with Joni Reining, NP.  You will receive a reminder letter in the mail two months in advance. If you don't receive a letter, please call our office to schedule the follow-up appointment.

## 2011-11-21 NOTE — Assessment & Plan Note (Signed)
Blood pressure is very well-controlled. He is followed by primary care for labs. Dr. Lodema Hong is due to see her within the next 2 weeks. Continue her on her current medication regimen without changes.

## 2011-11-21 NOTE — Progress Notes (Signed)
HPI: Judy Grant is a pleasant 78 year old patient of Dr. Dietrich Pates we are following for ongoing assessment and treatment of aortic valve disease, rheumatic in origin, without cardiopulmonary symptoms. She also has a history of atrial fibrillation, hypertension, hyperlipidemia, iron deficiency anemia. She comes today without cardiac complaints. However she is complaining of left shoulder and neck pain with minimal range of motion. She takes a lot of BC powders for this and occasionally some tramadol. She uses a walker for ambulation. She lives alone. She is quite independent however does depend on someone to bring her to physicians appointments. She denies chest pain, dyspnea on exertion, presyncope, or palpitations.  No Known Allergies  Current Outpatient Prescriptions  Medication Sig Dispense Refill  . acetaminophen (TYLENOL) 500 MG tablet Take 500 mg by mouth every 6 (six) hours as needed.        Marland Kitchen alendronate (FOSAMAX) 70 MG tablet Take with a full glass of water on an empty stomach.  4 tablet  5  . allopurinol (ZYLOPRIM) 100 MG tablet Take 100 mg by mouth daily.      . Calcium Carb-Cholecalciferol (OYSTER SHELL CALCIUM + D) 500-400 MG-UNIT TABS Take by mouth 3 (three) times daily.      . calcium-vitamin D (OSCAL WITH D) 500-200 MG-UNIT TABS TAKE (1) TABLET BY MOUTH (3) TIMES DAILY.  90 each  5  . DIOVAN 80 MG tablet TAKE ONE TABLET BY MOUTH ONCE DAILY.  30 each  5  . docusate sodium (COLACE) 50 MG capsule Take by mouth 2 (two) times daily.        . fluticasone (FLONASE) 50 MCG/ACT nasal spray 2 sprays by Nasal route daily.        . furosemide (LASIX) 40 MG tablet Take 1 tablet (40 mg total) by mouth daily.  30 tablet  5  . metoprolol (LOPRESSOR) 50 MG tablet TAKE 1 AND 1/2 TABLETS BYMOUTH IN THE MORNING AND1 TABLET IN THE EVENING.  75 tablet  5  . polyethylene glycol (MIRALAX / GLYCOLAX) packet Take 17 g by mouth daily.      . potassium chloride (KLOR-CON M10) 10 MEQ tablet Take 1 tablet  (10 mEq total) by mouth as directed.  45 tablet  5  . simvastatin (ZOCOR) 20 MG tablet TAKE (1) TABLET BY MOUTH AT BEDTIME FOR CHOLESTEROL.  30 tablet  5  . traMADol (ULTRAM) 50 MG tablet TAKE (1) TABLET BY MOUTH FOUR TIMES DAILY AS NEEDED FOR PAIN.  120 tablet  5  . ibuprofen (ADVIL,MOTRIN) 600 MG tablet Take 1 tablet (600 mg total) by mouth every 6 (six) hours as needed for pain.  30 tablet  3  . DISCONTD: potassium chloride (K-DUR) 10 MEQ tablet TAKE ONE TABLET BY MOUTH EVERY DAY,EXCEPT TWO ON MONDAY,WEDNSDAY,AND FRIDAY.  40 tablet  4    Past Medical History  Diagnosis Date  . Hyperlipidemia   . Osteoporosis   . Hypertension     with severe left ventricular hypertrophy; normal ejection fraction in 04/2005  . Valvular heart disease     Mild to moderate aortic stenosis; moderate to severe mitral stenosis in 2007; mild MR  . Paroxysmal atrial fibrillation     Remote  . Diverticulosis     Pancolonic; h/o LGI bleeding and diverticulitis  . Degenerative joint disease     Knees  . Chronic kidney disease     Mild; creatinine of 1.19-1.28 in recent years  . Anemia, iron deficiency   . Popliteal cyst  Left  . Fracture of wrist 2008    left  . Gout     Past Surgical History  Procedure Date  . Appendectomy 1944  . Total hip arthroplasty 1994    OZD:GUYQIH of systems complete and found to be negative unless listed above PHYSICAL EXAM BP 120/60  Pulse 72  Ht 5\' 4"  (1.626 m)  Wt 132 lb 4 oz (59.988 kg)  BMI 22.70 kg/m2  General: Well developed, well nourished, in no acute distress Head: Eyes PERRLA, No xanthomas.   Normal cephalic and atramatic  Lungs: Clear bilaterally to auscultation and percussion. Heart: HRRR S1 S2, 2/6 holosystolic murmur heard best at the right sternal border, with radiation to the carotids bilaterally..  Pulses are 2+ & equal.          No JVD.  No abdominal bruits. No femoral bruits. Abdomen: Bowel sounds are positive, abdomen soft and non-tender  without masses or                  Hernia's noted. Msk:  Back normal, normal gait. Normal strength and tone for age. Significant pain when she rotates her neck to the left or to the right, with a pinching feeling into her left shoulder with pain on range of motion.  Extremities: No clubbing, cyanosis or edema.  DP +1 Neuro: Alert and oriented X 3. Psych:  Good affect, responds appropriately    ASSESSMENT AND PLAN

## 2011-11-21 NOTE — Assessment & Plan Note (Signed)
She is having significant neck and shoulder pain on the left. She winces as she speaks to me in turns her head during our conversation. She has been taking a lot of BC powders for the pain. I have advised her against this and have her take her tramadol as directed by Dr. Lodema Hong, will have her sent to x-ray for cervical spine to evaluate for bone spurs, significant arthritis, or other etiology of this pain. She is to see Dr. Lodema Hong for further recommendations once the films are read. She is started on Motrin 600 mg nightly hours when necessary pain have also advised her to buy an over-the-counter Thermacare warm compress which may be helpful for her overall discomfort. We will defer to Dr. Lodema Hong for any further treatment recommendations.

## 2011-11-21 NOTE — Assessment & Plan Note (Signed)
She is completely asymptomatic at this time. She is not overly active but she remains independent. She uses a walker for ambulation. She denies any chest pain or presyncope or dyspnea on exertion. Continue to treat her medically. With her age and comorbidities, would prefer not to send for further testing or TAVI unless she becomes symptomatic.

## 2011-11-25 ENCOUNTER — Ambulatory Visit (HOSPITAL_COMMUNITY)
Admission: RE | Admit: 2011-11-25 | Discharge: 2011-11-25 | Disposition: A | Discharge status: Home / Self Care | Payer: PRIVATE HEALTH INSURANCE | Source: Ambulatory Visit | Attending: Adult Health | Admitting: Adult Health

## 2011-11-25 DIAGNOSIS — M542 Cervicalgia: Secondary | ICD-10-CM | POA: Insufficient documentation

## 2011-11-25 DIAGNOSIS — M5030 Other cervical disc degeneration, unspecified cervical region: Secondary | ICD-10-CM | POA: Insufficient documentation

## 2011-12-01 ENCOUNTER — Emergency Department (HOSPITAL_COMMUNITY)
Admission: EM | Admit: 2011-12-01 | Discharge: 2011-12-01 | Disposition: A | Discharge status: Home / Self Care | Payer: PRIVATE HEALTH INSURANCE | Attending: Emergency Medicine | Admitting: Emergency Medicine

## 2011-12-01 ENCOUNTER — Encounter (HOSPITAL_COMMUNITY): Payer: Self-pay | Admitting: *Deleted

## 2011-12-01 ENCOUNTER — Emergency Department (HOSPITAL_COMMUNITY): Payer: PRIVATE HEALTH INSURANCE

## 2011-12-01 DIAGNOSIS — I129 Hypertensive chronic kidney disease with stage 1 through stage 4 chronic kidney disease, or unspecified chronic kidney disease: Secondary | ICD-10-CM | POA: Insufficient documentation

## 2011-12-01 DIAGNOSIS — I4891 Unspecified atrial fibrillation: Secondary | ICD-10-CM | POA: Insufficient documentation

## 2011-12-01 DIAGNOSIS — M109 Gout, unspecified: Secondary | ICD-10-CM | POA: Insufficient documentation

## 2011-12-01 DIAGNOSIS — Z87891 Personal history of nicotine dependence: Secondary | ICD-10-CM | POA: Insufficient documentation

## 2011-12-01 DIAGNOSIS — S8252XA Displaced fracture of medial malleolus of left tibia, initial encounter for closed fracture: Secondary | ICD-10-CM

## 2011-12-01 DIAGNOSIS — D509 Iron deficiency anemia, unspecified: Secondary | ICD-10-CM | POA: Insufficient documentation

## 2011-12-01 DIAGNOSIS — W2203XA Walked into furniture, initial encounter: Secondary | ICD-10-CM | POA: Insufficient documentation

## 2011-12-01 DIAGNOSIS — S8253XA Displaced fracture of medial malleolus of unspecified tibia, initial encounter for closed fracture: Secondary | ICD-10-CM | POA: Insufficient documentation

## 2011-12-01 DIAGNOSIS — N189 Chronic kidney disease, unspecified: Secondary | ICD-10-CM | POA: Insufficient documentation

## 2011-12-01 DIAGNOSIS — E785 Hyperlipidemia, unspecified: Secondary | ICD-10-CM | POA: Insufficient documentation

## 2011-12-01 DIAGNOSIS — M810 Age-related osteoporosis without current pathological fracture: Secondary | ICD-10-CM | POA: Insufficient documentation

## 2011-12-01 MED ORDER — HYDROCODONE-ACETAMINOPHEN 5-325 MG PO TABS: 1.0000 | ORAL_TABLET | ORAL | Status: AC | PRN
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
## 2011-12-01 NOTE — ED Notes (Signed)
Family wants to wait for home health nurse before cam walker applied.
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
## 2011-12-07 ENCOUNTER — Encounter: Payer: Self-pay | Admitting: Family Medicine

## 2011-12-07 ENCOUNTER — Ambulatory Visit (INDEPENDENT_AMBULATORY_CARE_PROVIDER_SITE_OTHER): Payer: PRIVATE HEALTH INSURANCE | Admitting: Family Medicine

## 2011-12-07 VITALS — BP 120/68 | HR 79 | Resp 15 | Ht 63.0 in

## 2011-12-07 DIAGNOSIS — M129 Arthropathy, unspecified: Secondary | ICD-10-CM

## 2011-12-07 DIAGNOSIS — S82899A Other fracture of unspecified lower leg, initial encounter for closed fracture: Secondary | ICD-10-CM

## 2011-12-07 DIAGNOSIS — S82892A Other fracture of left lower leg, initial encounter for closed fracture: Secondary | ICD-10-CM

## 2011-12-07 DIAGNOSIS — Z23 Encounter for immunization: Secondary | ICD-10-CM

## 2011-12-07 DIAGNOSIS — I10 Essential (primary) hypertension: Secondary | ICD-10-CM

## 2011-12-07 DIAGNOSIS — E785 Hyperlipidemia, unspecified: Secondary | ICD-10-CM

## 2011-12-07 NOTE — Assessment & Plan Note (Signed)
Controlled, no change in medication  
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
## 2011-12-07 NOTE — Assessment & Plan Note (Signed)
Severe, currently in a wheelchair

## 2011-12-07 NOTE — Patient Instructions (Signed)
F/u mid to end December.Please call if you need me before   Fasting lipid, and cmp in December before visit.  Sorry about your left ankle, be careful.   Flu vaccine today

## 2011-12-07 NOTE — Assessment & Plan Note (Signed)
Recent trauma to ankle referred to orthopedics

## 2011-12-15 ENCOUNTER — Telehealth: Payer: Self-pay | Admitting: Family Medicine

## 2011-12-15 NOTE — Telephone Encounter (Signed)
Need to check pharmacy see what she last got and document, let me know

## 2011-12-16 ENCOUNTER — Other Ambulatory Visit: Payer: Self-pay | Admitting: Family Medicine

## 2011-12-16 MED ORDER — HYDROCODONE-ACETAMINOPHEN 5-500 MG PO TABS: ORAL_TABLET | ORAL | Status: DC

## 2011-12-16 NOTE — Telephone Encounter (Signed)
Patient aware.

## 2011-12-16 NOTE — Telephone Encounter (Signed)
Last got hydrocodone 5/325mg  1 tab q 4 hrs prn from Dr Ignacia Palma at the hospital

## 2011-12-16 NOTE — Telephone Encounter (Signed)
Prescription printed for one twice daily pls fax let pt  Know, also send stamped note to Dr Hilda Lias making him aware of script , thanks

## 2012-01-05 ENCOUNTER — Inpatient Hospital Stay (HOSPITAL_COMMUNITY)
Admission: EM | Admit: 2012-01-05 | Discharge: 2012-01-10 | DRG: 064 | Disposition: A | Discharge status: Skilled Nursing Facility | Payer: PRIVATE HEALTH INSURANCE | Attending: Internal Medicine | Admitting: Internal Medicine

## 2012-01-05 ENCOUNTER — Inpatient Hospital Stay (HOSPITAL_COMMUNITY): Payer: PRIVATE HEALTH INSURANCE

## 2012-01-05 ENCOUNTER — Emergency Department (HOSPITAL_COMMUNITY): Payer: PRIVATE HEALTH INSURANCE

## 2012-01-05 ENCOUNTER — Encounter (HOSPITAL_COMMUNITY): Payer: Self-pay | Admitting: Emergency Medicine

## 2012-01-05 DIAGNOSIS — I10 Essential (primary) hypertension: Secondary | ICD-10-CM | POA: Diagnosis present

## 2012-01-05 DIAGNOSIS — M129 Arthropathy, unspecified: Secondary | ICD-10-CM

## 2012-01-05 DIAGNOSIS — I080 Rheumatic disorders of both mitral and aortic valves: Secondary | ICD-10-CM | POA: Diagnosis present

## 2012-01-05 DIAGNOSIS — I129 Hypertensive chronic kidney disease with stage 1 through stage 4 chronic kidney disease, or unspecified chronic kidney disease: Secondary | ICD-10-CM | POA: Diagnosis present

## 2012-01-05 DIAGNOSIS — I359 Nonrheumatic aortic valve disorder, unspecified: Secondary | ICD-10-CM

## 2012-01-05 DIAGNOSIS — Z96649 Presence of unspecified artificial hip joint: Secondary | ICD-10-CM

## 2012-01-05 DIAGNOSIS — G8190 Hemiplegia, unspecified affecting unspecified side: Secondary | ICD-10-CM | POA: Diagnosis present

## 2012-01-05 DIAGNOSIS — I6350 Cerebral infarction due to unspecified occlusion or stenosis of unspecified cerebral artery: Secondary | ICD-10-CM

## 2012-01-05 DIAGNOSIS — E785 Hyperlipidemia, unspecified: Secondary | ICD-10-CM | POA: Diagnosis present

## 2012-01-05 DIAGNOSIS — R4701 Aphasia: Secondary | ICD-10-CM | POA: Diagnosis present

## 2012-01-05 DIAGNOSIS — I4891 Unspecified atrial fibrillation: Secondary | ICD-10-CM | POA: Diagnosis present

## 2012-01-05 DIAGNOSIS — I639 Cerebral infarction, unspecified: Secondary | ICD-10-CM | POA: Diagnosis present

## 2012-01-05 DIAGNOSIS — K5790 Diverticulosis of intestine, part unspecified, without perforation or abscess without bleeding: Secondary | ICD-10-CM

## 2012-01-05 DIAGNOSIS — I050 Rheumatic mitral stenosis: Secondary | ICD-10-CM | POA: Diagnosis present

## 2012-01-05 DIAGNOSIS — Z87891 Personal history of nicotine dependence: Secondary | ICD-10-CM

## 2012-01-05 DIAGNOSIS — S82892A Other fracture of left lower leg, initial encounter for closed fracture: Secondary | ICD-10-CM

## 2012-01-05 DIAGNOSIS — N289 Disorder of kidney and ureter, unspecified: Secondary | ICD-10-CM

## 2012-01-05 DIAGNOSIS — I6340 Cerebral infarction due to embolism of unspecified cerebral artery: Principal | ICD-10-CM | POA: Diagnosis present

## 2012-01-05 DIAGNOSIS — N189 Chronic kidney disease, unspecified: Secondary | ICD-10-CM

## 2012-01-05 DIAGNOSIS — D509 Iron deficiency anemia, unspecified: Secondary | ICD-10-CM | POA: Diagnosis present

## 2012-01-05 DIAGNOSIS — N183 Chronic kidney disease, stage 3 unspecified: Secondary | ICD-10-CM | POA: Diagnosis present

## 2012-01-05 DIAGNOSIS — M1710 Unilateral primary osteoarthritis, unspecified knee: Secondary | ICD-10-CM | POA: Diagnosis present

## 2012-01-05 DIAGNOSIS — D649 Anemia, unspecified: Secondary | ICD-10-CM

## 2012-01-05 DIAGNOSIS — I214 Non-ST elevation (NSTEMI) myocardial infarction: Secondary | ICD-10-CM

## 2012-01-05 DIAGNOSIS — I350 Nonrheumatic aortic (valve) stenosis: Secondary | ICD-10-CM | POA: Diagnosis present

## 2012-01-05 DIAGNOSIS — M25411 Effusion, right shoulder: Secondary | ICD-10-CM | POA: Diagnosis present

## 2012-01-05 DIAGNOSIS — M810 Age-related osteoporosis without current pathological fracture: Secondary | ICD-10-CM | POA: Diagnosis present

## 2012-01-05 DIAGNOSIS — I480 Paroxysmal atrial fibrillation: Secondary | ICD-10-CM

## 2012-01-05 HISTORY — DX: Non-ST elevation (NSTEMI) myocardial infarction: I21.4

## 2012-01-05 LAB — COMPREHENSIVE METABOLIC PANEL
ALT: 8 U/L (ref 0–35)
Albumin: 3.9 g/dL (ref 3.5–5.2)
Alkaline Phosphatase: 49 U/L (ref 39–117)
Calcium: 10.7 mg/dL — ABNORMAL HIGH (ref 8.4–10.5)
GFR calc Af Amer: 41 mL/min — ABNORMAL LOW (ref >90)
Glucose, Bld: 82 mg/dL (ref 70–99)
Potassium: 4.2 mEq/L (ref 3.5–5.1)
Sodium: 137 mEq/L (ref 135–145)
Total Protein: 7.4 g/dL (ref 6.0–8.3)

## 2012-01-05 LAB — URINALYSIS, ROUTINE W REFLEX MICROSCOPIC
Bilirubin Urine: NEGATIVE
Glucose, UA: NEGATIVE mg/dL
Ketones, ur: NEGATIVE mg/dL
Leukocytes, UA: NEGATIVE
Nitrite: NEGATIVE
Specific Gravity, Urine: 1.01 (ref 1.005–1.030)
pH: 7 (ref 5.0–8.0)

## 2012-01-05 LAB — MRSA PCR SCREENING: MRSA by PCR: NEGATIVE

## 2012-01-05 LAB — DIFFERENTIAL
Basophils Absolute: 0.1 10*3/uL (ref 0.0–0.1)
Basophils Relative: 1 % (ref 0–1)
Eosinophils Absolute: 0.1 10*3/uL (ref 0.0–0.7)
Eosinophils Relative: 2 % (ref 0–5)
Lymphs Abs: 1.6 10*3/uL (ref 0.7–4.0)
Neutrophils Relative %: 69 % (ref 43–77)

## 2012-01-05 LAB — CBC
MCH: 29.8 pg (ref 26.0–34.0)
MCHC: 32.1 g/dL (ref 30.0–36.0)
MCV: 92.6 fL (ref 78.0–100.0)
Platelets: 203 10*3/uL (ref 150–400)
RDW: 12.5 % (ref 11.5–15.5)

## 2012-01-05 LAB — RAPID URINE DRUG SCREEN, HOSP PERFORMED
Amphetamines: NOT DETECTED
Benzodiazepines: NOT DETECTED
Opiates: NOT DETECTED
Tetrahydrocannabinol: NOT DETECTED

## 2012-01-05 LAB — GLUCOSE, CAPILLARY
Glucose-Capillary: 79 mg/dL (ref 70–99)
Glucose-Capillary: 85 mg/dL (ref 70–99)

## 2012-01-05 LAB — PROTIME-INR
INR: 0.98 (ref 0.00–1.49)
Prothrombin Time: 12.9 seconds (ref 11.6–15.2)

## 2012-01-05 LAB — TROPONIN I: Troponin I: 3.29 ng/mL (ref <0.30)

## 2012-01-05 MED ORDER — MORPHINE SULFATE 2 MG/ML IJ SOLN: 2.0000 mg | INTRAMUSCULAR | Status: DC | PRN

## 2012-01-05 MED ORDER — ONDANSETRON HCL 4 MG/2ML IJ SOLN: 4.0000 mg | Freq: Three times a day (TID) | INTRAMUSCULAR | Status: DC | PRN

## 2012-01-05 MED ORDER — ASPIRIN 300 MG RE SUPP
300.0000 mg | RECTAL | Status: DC
  Filled 2012-01-05: qty 1

## 2012-01-05 MED ORDER — HEPARIN BOLUS VIA INFUSION
3000.0000 [IU] | Freq: Once | INTRAVENOUS | Status: AC
  Administered 2012-01-05: 3000 [IU] via INTRAVENOUS

## 2012-01-05 MED ORDER — ASPIRIN EC 81 MG PO TBEC
81.0000 mg | DELAYED_RELEASE_TABLET | Freq: Every day | ORAL | Status: DC
  Administered 2012-01-06 – 2012-01-10 (×5): 81 mg via ORAL
  Filled 2012-01-05 (×5): qty 1

## 2012-01-05 MED ORDER — METOPROLOL TARTRATE 1 MG/ML IV SOLN
5.0000 mg | Freq: Three times a day (TID) | INTRAVENOUS | Status: DC
  Administered 2012-01-05: 5 mg via INTRAVENOUS
  Filled 2012-01-05 (×4): qty 5

## 2012-01-05 MED ORDER — ACETAMINOPHEN 325 MG PO TABS
650.0000 mg | ORAL_TABLET | ORAL | Status: DC | PRN
  Administered 2012-01-06 – 2012-01-09 (×6): 650 mg via ORAL
  Filled 2012-01-05 (×5): qty 2
  Filled 2012-01-05 (×2): qty 1

## 2012-01-05 MED ORDER — ASPIRIN 325 MG PO TABS
325.0000 mg | ORAL_TABLET | Freq: Once | ORAL | Status: AC
  Administered 2012-01-05: 325 mg via ORAL
  Filled 2012-01-05: qty 1

## 2012-01-05 MED ORDER — ONDANSETRON HCL 4 MG/2ML IJ SOLN: 4.0000 mg | Freq: Four times a day (QID) | INTRAMUSCULAR | Status: DC | PRN

## 2012-01-05 MED ORDER — SIMVASTATIN 20 MG PO TABS
20.0000 mg | ORAL_TABLET | Freq: Every day | ORAL | Status: DC
  Administered 2012-01-05 – 2012-01-09 (×4): 20 mg via ORAL
  Filled 2012-01-05 (×7): qty 1

## 2012-01-05 MED ORDER — NITROGLYCERIN 0.4 MG SL SUBL: 0.4000 mg | SUBLINGUAL_TABLET | SUBLINGUAL | Status: DC | PRN

## 2012-01-05 MED ORDER — ASPIRIN 81 MG PO CHEW: 324.0000 mg | CHEWABLE_TABLET | ORAL | Status: AC

## 2012-01-05 MED ORDER — SODIUM CHLORIDE 0.9 % IJ SOLN
3.0000 mL | Freq: Two times a day (BID) | INTRAMUSCULAR | Status: DC
  Administered 2012-01-05 – 2012-01-06 (×2): 3 mL via INTRAVENOUS

## 2012-01-05 MED ORDER — HEPARIN (PORCINE) IN NACL 100-0.45 UNIT/ML-% IJ SOLN
750.0000 [IU]/h | INTRAMUSCULAR | Status: DC
  Administered 2012-01-05: 750 [IU]/h via INTRAVENOUS
  Filled 2012-01-05: qty 250

## 2012-01-05 MED ORDER — SODIUM CHLORIDE 0.9 % IJ SOLN
3.0000 mL | INTRAMUSCULAR | Status: DC | PRN
  Administered 2012-01-09: 3 mL via INTRAVENOUS

## 2012-01-05 MED ORDER — SODIUM CHLORIDE 0.9 % IV SOLN: 250.0000 mL | INTRAVENOUS | Status: DC | PRN

## 2012-01-05 NOTE — Consult Note (Signed)
Referring Physician: Dr. Lavera Guise    Chief Complaint: left sided weakness  HPI: Judy Grant is an 78 y.o. female who was admitted today with right sided weakness and aphasia that started yesterday, unknown time of onset. She arrived in ER today around 1032. She has had neck pain earlier today but no chest pain. Her EKG shows minimally elevated inf-lat changes with intermittent a-flutter. History of aortic stenosis.   Currently she is not aphasic. She does complain of right sided weakness and right visual changes. She has been started on heparin earlier in the day with no neurological worsening. Head CT negative for acute stroke or hemorrhage.  LSN: 01/04/2012 tPA Given: No: out of window  Past Medical History  Diagnosis Date  . Hyperlipidemia   . Osteoporosis   . Hypertension     with severe left ventricular hypertrophy; normal ejection fraction in 04/2005  . Valvular heart disease     Mild to moderate aortic stenosis; moderate to severe mitral stenosis in 2007; mild MR  . Paroxysmal atrial fibrillation     Remote  . Diverticulosis     Pancolonic; h/o LGI bleeding and diverticulitis  . Degenerative joint disease     Knees  . Chronic kidney disease     Mild; creatinine of 1.19-1.28 in recent years  . Anemia, iron deficiency   . Popliteal cyst     Left  . Fracture of wrist 2008    left  . Gout     Past Surgical History  Procedure Date  . Appendectomy 1944  . Total hip arthroplasty 1994    Family History  Problem Relation Age of Onset  . Diabetes Mother    Social History:  reports that she quit smoking about 39 years ago. She has never used smokeless tobacco. She reports that she does not drink alcohol or use illicit drugs.  Allergies: No Known Allergies  Medications:  Prior to Admission:  Prescriptions prior to admission  Medication Sig Dispense Refill  . alendronate (FOSAMAX) 70 MG tablet Take with a full glass of water on an empty stomach.  4 tablet  5  .  allopurinol (ZYLOPRIM) 100 MG tablet Take 100 mg by mouth daily.      . Calcium Carb-Cholecalciferol (OYSTER SHELL CALCIUM + D) 500-400 MG-UNIT TABS Take 1 tablet by mouth 3 (three) times daily.       Marland Kitchen docusate sodium (COLACE) 50 MG capsule Take by mouth 2 (two) times daily.       . fluticasone (FLONASE) 50 MCG/ACT nasal spray Place 2 sprays into the nose daily.       . furosemide (LASIX) 40 MG tablet Take 1 tablet (40 mg total) by mouth daily.  30 tablet  5  . HYDROcodone-acetaminophen (VICODIN) 5-500 MG per tablet Take 1 tablet by mouth 2 (two) times daily as needed. uncontrolled arthritic pain      . metoprolol (LOPRESSOR) 50 MG tablet Take 50-75 mg by mouth 2 (two) times daily. TAKE 1 AND 1/2 TABLETS (75mg ) BYMOUTH IN THE MORNING AND1 TABLET (50mg ) IN THE EVENING.      Marland Kitchen polyethylene glycol (MIRALAX / GLYCOLAX) packet Take 17 g by mouth daily.      . potassium chloride (KLOR-CON M10) 10 MEQ tablet Take 1 tablet (10 mEq total) by mouth as directed.  45 tablet  5  . simvastatin (ZOCOR) 20 MG tablet Take 20 mg by mouth at bedtime. FOR CHOLESTEROL.      Marland Kitchen traMADol (ULTRAM) 50 MG tablet  Take 50 mg by mouth every 6 (six) hours as needed. Pain.      . valsartan (DIOVAN) 80 MG tablet Take 80 mg by mouth daily.       Scheduled:   . aspirin  324 mg Oral NOW   Or  . aspirin  300 mg Rectal NOW  . aspirin EC  81 mg Oral Daily  . aspirin  325 mg Oral Once  . heparin  3,000 Units Intravenous Once  . simvastatin  20 mg Oral q1800  . sodium chloride  3 mL Intravenous Q12H   Continuous:   . heparin 750 Units/hr (01/05/12 1440)    ROS: History obtained from chart review, the patient and multiple family members  General ROS: negative for - chills, fatigue, fever, night sweats, weight gain or weight loss Psychological ROS: negative for - behavioral disorder, hallucinations, memory difficulties, mood swings or suicidal ideation Ophthalmic ROS: negative for - blurry vision, double vision, eye pain or  loss of vision, +visual change (patient cannot describe) ENT ROS: negative for - epistaxis, nasal discharge, oral lesions, sore throat, tinnitus or vertigo Allergy and Immunology ROS: negative for - hives or itchy/watery eyes Hematological and Lymphatic ROS: negative for - bleeding problems, bruising or swollen lymph nodes Endocrine ROS: negative for - galactorrhea, hair pattern changes, polydipsia/polyuria or temperature intolerance Respiratory ROS: negative for - cough, hemoptysis, shortness of breath or wheezing Cardiovascular ROS: negative for - chest pain, dyspnea on exertion, edema or irregular heartbeat, +neck discomfort resolved Gastrointestinal ROS: negative for - abdominal pain, diarrhea, hematemesis, nausea/vomiting or stool incontinence Genito-Urinary ROS: negative for - dysuria, hematuria, incontinence or urinary frequency/urgency Musculoskeletal ROS: negative for - joint swelling or muscular weakness Neurological ROS: as noted in HPI Dermatological ROS: negative for rash and skin lesion changes    Physical Examination: Blood pressure 155/88, pulse 84, temperature 98.1 F (36.7 C), temperature source Oral, resp. rate 17, height 5\' 2"  (1.575 m), weight 60.9 kg (134 lb 4.2 oz), SpO2 96.00%.  Neurologic Examination: Mental Status: Alert, oriented, thought content appropriate.  Speech fluent without evidence of aphasia.  Able to follow 3 step commands without difficulty. Cranial Nerves: II: Pupils equal, round, reactive to light and accommodation, patient able to appreciate fingers moving in all visual fields III,IV, VI: ptosis not present, extra-ocular motions intact bilaterally V,VII: smile symmetric, facial light touch sensation decreased right VIII: hard of hearing IX,X: gag reflex present XI: trapezius strength/neck flexion strength normal bilaterally XII: tongue strength normal  Motor: Right : Upper extremity   5/5 with pronator drift Left:     Upper extremity    4/5  Lower extremity   5/5     Lower extremity   4/5 Tone and bulk:normal tone throughout; no atrophy noted Sensory: Pinprick and light touch decreased right throughout, Deep Tendon Reflexes: 2+ and symmetric with absent AJ;s bilaterally Plantars: Right: downgoing   Left: downgoing Cerebellar: Finger to nose and heel to shin intact bilaterally    Results for orders placed during the hospital encounter of 01/05/12 (from the past 48 hour(s))  PROTIME-INR     Status: Normal   Collection Time   01/05/12 11:53 AM      Component Value Range Comment   Prothrombin Time 12.9  11.6 - 15.2 seconds    INR 0.98  0.00 - 1.49   APTT     Status: Normal   Collection Time   01/05/12 11:53 AM      Component Value Range Comment   aPTT  30  24 - 37 seconds   CBC     Status: Abnormal   Collection Time   01/05/12 11:53 AM      Component Value Range Comment   WBC 6.4  4.0 - 10.5 K/uL    RBC 3.63 (*) 3.87 - 5.11 MIL/uL    Hemoglobin 10.8 (*) 12.0 - 15.0 g/dL    HCT 16.1 (*) 09.6 - 46.0 %    MCV 92.6  78.0 - 100.0 fL    MCH 29.8  26.0 - 34.0 pg    MCHC 32.1  30.0 - 36.0 g/dL    RDW 04.5  40.9 - 81.1 %    Platelets 203  150 - 400 K/uL   DIFFERENTIAL     Status: Normal   Collection Time   01/05/12 11:53 AM      Component Value Range Comment   Neutrophils Relative 69  43 - 77 %    Neutro Abs 4.4  1.7 - 7.7 K/uL    Lymphocytes Relative 24  12 - 46 %    Lymphs Abs 1.6  0.7 - 4.0 K/uL    Monocytes Relative 5  3 - 12 %    Monocytes Absolute 0.3  0.1 - 1.0 K/uL    Eosinophils Relative 2  0 - 5 %    Eosinophils Absolute 0.1  0.0 - 0.7 K/uL    Basophils Relative 1  0 - 1 %    Basophils Absolute 0.1  0.0 - 0.1 K/uL   COMPREHENSIVE METABOLIC PANEL     Status: Abnormal   Collection Time   01/05/12 11:53 AM      Component Value Range Comment   Sodium 137  135 - 145 mEq/L    Potassium 4.2  3.5 - 5.1 mEq/L    Chloride 100  96 - 112 mEq/L    CO2 25  19 - 32 mEq/L    Glucose, Bld 82  70 - 99 mg/dL     BUN 25 (*) 6 - 23 mg/dL    Creatinine, Ser 9.14 (*) 0.50 - 1.10 mg/dL    Calcium 78.2 (*) 8.4 - 10.5 mg/dL    Total Protein 7.4  6.0 - 8.3 g/dL    Albumin 3.9  3.5 - 5.2 g/dL    AST 21  0 - 37 U/L    ALT 8  0 - 35 U/L    Alkaline Phosphatase 49  39 - 117 U/L    Total Bilirubin 0.6  0.3 - 1.2 mg/dL    GFR calc non Af Amer 36 (*) >90 mL/min    GFR calc Af Amer 41 (*) >90 mL/min   TROPONIN I     Status: Abnormal   Collection Time   01/05/12 11:53 AM      Component Value Range Comment   Troponin I 3.06 (*) <0.30 ng/mL   GLUCOSE, CAPILLARY     Status: Normal   Collection Time   01/05/12 11:56 AM      Component Value Range Comment   Glucose-Capillary 73  70 - 99 mg/dL   URINALYSIS, ROUTINE W REFLEX MICROSCOPIC     Status: Normal   Collection Time   01/05/12 12:57 PM      Component Value Range Comment   Color, Urine YELLOW  YELLOW    APPearance CLEAR  CLEAR    Specific Gravity, Urine 1.010  1.005 - 1.030    pH 7.0  5.0 - 8.0    Glucose, UA NEGATIVE  NEGATIVE mg/dL    Hgb urine dipstick NEGATIVE  NEGATIVE    Bilirubin Urine NEGATIVE  NEGATIVE    Ketones, ur NEGATIVE  NEGATIVE mg/dL    Protein, ur NEGATIVE  NEGATIVE mg/dL    Urobilinogen, UA 0.2  0.0 - 1.0 mg/dL    Nitrite NEGATIVE  NEGATIVE    Leukocytes, UA NEGATIVE  NEGATIVE MICROSCOPIC NOT DONE ON URINES WITH NEGATIVE PROTEIN, BLOOD, LEUKOCYTES, NITRITE, OR GLUCOSE <1000 mg/dL.  URINE RAPID DRUG SCREEN (HOSP PERFORMED)     Status: Normal   Collection Time   01/05/12 12:58 PM      Component Value Range Comment   Opiates NONE DETECTED  NONE DETECTED    Cocaine NONE DETECTED  NONE DETECTED    Benzodiazepines NONE DETECTED  NONE DETECTED    Amphetamines NONE DETECTED  NONE DETECTED    Tetrahydrocannabinol NONE DETECTED  NONE DETECTED    Barbiturates NONE DETECTED  NONE DETECTED    Ct Head Wo Contrast  01/05/2012  IMPRESSION:  No intracranial hemorrhage.  Small vessel disease type changes without CT evidence of large acute  infarct.   Original Report Authenticated By: Fuller Canada, M.D.     Assessment: 78 y.o. female admitted with initial complaints of right hemiparesis, aphasia that has now improved to just a right hemiparesis.  Distribution of symptoms do suggest a MCA distribution ischemic event, event though this was not appreciated on CT.  Patient already started on heparin and bolused due to elevated troponin and EKG changes.  Cardiology has been consulted.  Stroke Risk Factors - atrial fibrillation, hyperlipidemia and hypertension  Plan: 1. HgbA1c, fasting lipid panel 2. MRI of the brain without contrast to be performed STAT. 3. PT consult, OT consult, Speech consult 4. Would discontinue heparin at this time.  If MRI unremarkable or only exhibits a small acute event heparin may be restarted but would not bolus.  Films will be followed up.   5. Continued telemetry monitoring 6. Frequent neuro checks 7. Carotid dopplers 8. Echocardiogram  Guy Franco PA-C, MBA, MHA [facility] Pager [phone]   01/05/2012, 6:25 PM

## 2012-01-05 NOTE — Progress Notes (Signed)
78 y/o who lives at home by herself presented to the ED with new onset Left Arm and Left leg weakness as well as difficulty with speech.  Ct of head was negative for acute stroke.  Also on further examination patient had elevated troponin of 3.0 and was started on heparin gtt.  Reportedly patient and family want everything done per my conversation with Dr. Preston Fleeting at [facility] hospital.  Patient to come to [facility] for further assessment and plans given current medical condition.  Braelon Sprung, [employer]

## 2012-01-05 NOTE — ED Notes (Signed)
CRITICAL VALUE ALERT  Critical value received: Troponin I  3.06  Date of notification:  01/05/12  Time of notification:  1345  Critical value read back:  yes  Nurse who received alert:  Audie Pinto  RN  MD notified (1st page):  Dr. Preston Fleeting  Time of first page:  1346  MD notified (2nd page):  Time of second page:  Responding MD:   Time MD responded:

## 2012-01-05 NOTE — Consult Note (Signed)
Patient ID: Judy Grant MRN: [id number], DOB/AGE: 76-Dec-1919   Admit date: 01/05/2012 Date of Consult: @TODAY @  Primary Physician: [name], MD Primary Cardiologist: R Rothbart  Pt. Profile: Asked to see re elevated troponin.  Problem List: Past Medical History  Diagnosis Date  . Hyperlipidemia   . Osteoporosis   . Hypertension     with severe left ventricular hypertrophy; normal ejection fraction in 04/2005  . Valvular heart disease     Mild to moderate aortic stenosis; moderate to severe mitral stenosis in 2007; mild MR  . Paroxysmal atrial fibrillation     Remote  . Diverticulosis     Pancolonic; h/o LGI bleeding and diverticulitis  . Degenerative joint disease     Knees  . Chronic kidney disease     Mild; creatinine of 1.19-1.28 in recent years  . Anemia, iron deficiency   . Popliteal cyst     Left  . Fracture of wrist 2008    left  . Gout     Past Surgical History  Procedure Date  . Appendectomy 1944  . Total hip arthroplasty 1994     Allergies: No Known Allergies  HPI: Patient is a 77 year old with a history of atrial fibrillation, HTN, anemia, hyperlipidemia and rheumatic valve diseasd.  She is followed by Towanda Octave in clinic  Last seen in June Echo in 2011 showed hyperdynamic LV.  Moderate LVH.  AV with mild to moderate AS (mean gradient of 17 mmHG).  MV with midl stenosis.  TV with moderate TR.   She was admitted today with complaints of R arm weakness and problems speaking.  She woke up with these problems.  She denies CP  No SOB  She did have some L sided neck pain earlier but that is gone.  Has that intermittently. Inpatient Medications:    . aspirin  324 mg Oral NOW   Or  . aspirin  300 mg Rectal NOW  . aspirin EC  81 mg Oral Daily  . aspirin  325 mg Oral Once  . heparin  3,000 Units Intravenous Once  . simvastatin  20 mg Oral q1800  . sodium chloride  3 mL Intravenous Q12H    Family History  Problem Relation Age of Onset  .  Diabetes Mother      History   Social History  . Marital Status: Widowed    Spouse Name: N/A    Number of Children: 3  . Years of Education: N/A   Occupational History  . Retired     Marine scientist, forming   Social History Main Topics  . Smoking status: Former Smoker    Quit date: 08/31/1972  . Smokeless tobacco: Never Used  . Alcohol Use: No  . Drug Use: No  . Sexually Active: Not on file   Other Topics Concern  . Not on file   Social History Narrative  . No narrative on file     Review of Systems: All other systems reviewed and are otherwise negative except as noted above.  Physical Exam: Filed Vitals:   01/05/12 1700  BP: 155/88  Pulse: 84  Temp: 98.1 F (36.7 C)  Resp: 17   No intake or output data in the 24 hours ending 01/05/12 1757  Gen:  Thin 78 year old in NAD Head: Question L bruit.  JVP not elevated. Lungs: Clear bilaterally to auscultation without wheezes, rales, or rhonchi. Breathing is unlabored. Heart: RRR with S1 S2. Gr III/VI systolic murmur L sternal  border.  QUestion I/VI diastolic murmur at apex. Abdomen: Soft, non-tender, non-distended with normoactive bowel sounds. No hepatomegaly. No rebound/guarding. No obvious abdominal masses. Msk:  Strength and tone appears normal for age. Extremities: No clubbing, cyanosis or edema.  Distal pedal pulses are 2+ and equal bilaterally. Neuro: Alert and oriented X 3. Speech is mildly garbled.  L arm is weak  Otherwise deferred to neuro. Psych:  Responds to questions appropriately  Labs: Results for orders placed during the hospital encounter of 01/05/12 (from the past 24 hour(s))  PROTIME-INR     Status: Normal   Collection Time   01/05/12 11:53 AM      Component Value Range   Prothrombin Time 12.9  11.6 - 15.2 seconds   INR 0.98  0.00 - 1.49  APTT     Status: Normal   Collection Time   01/05/12 11:53 AM      Component Value Range   aPTT 30  24 - 37 seconds  CBC     Status: Abnormal   Collection  Time   01/05/12 11:53 AM      Component Value Range   WBC 6.4  4.0 - 10.5 K/uL   RBC 3.63 (*) 3.87 - 5.11 MIL/uL   Hemoglobin 10.8 (*) 12.0 - 15.0 g/dL   HCT 16.1 (*) 09.6 - 04.5 %   MCV 92.6  78.0 - 100.0 fL   MCH 29.8  26.0 - 34.0 pg   MCHC 32.1  30.0 - 36.0 g/dL   RDW 40.9  81.1 - 91.4 %   Platelets 203  150 - 400 K/uL  DIFFERENTIAL     Status: Normal   Collection Time   01/05/12 11:53 AM      Component Value Range   Neutrophils Relative 69  43 - 77 %   Neutro Abs 4.4  1.7 - 7.7 K/uL   Lymphocytes Relative 24  12 - 46 %   Lymphs Abs 1.6  0.7 - 4.0 K/uL   Monocytes Relative 5  3 - 12 %   Monocytes Absolute 0.3  0.1 - 1.0 K/uL   Eosinophils Relative 2  0 - 5 %   Eosinophils Absolute 0.1  0.0 - 0.7 K/uL   Basophils Relative 1  0 - 1 %   Basophils Absolute 0.1  0.0 - 0.1 K/uL  COMPREHENSIVE METABOLIC PANEL     Status: Abnormal   Collection Time   01/05/12 11:53 AM      Component Value Range   Sodium 137  135 - 145 mEq/L   Potassium 4.2  3.5 - 5.1 mEq/L   Chloride 100  96 - 112 mEq/L   CO2 25  19 - 32 mEq/L   Glucose, Bld 82  70 - 99 mg/dL   BUN 25 (*) 6 - 23 mg/dL   Creatinine, Ser 7.82 (*) 0.50 - 1.10 mg/dL   Calcium 95.6 (*) 8.4 - 10.5 mg/dL   Total Protein 7.4  6.0 - 8.3 g/dL   Albumin 3.9  3.5 - 5.2 g/dL   AST 21  0 - 37 U/L   ALT 8  0 - 35 U/L   Alkaline Phosphatase 49  39 - 117 U/L   Total Bilirubin 0.6  0.3 - 1.2 mg/dL   GFR calc non Af Amer 36 (*) >90 mL/min   GFR calc Af Amer 41 (*) >90 mL/min  TROPONIN I     Status: Abnormal   Collection Time   01/05/12 11:53 AM  Component Value Range   Troponin I 3.06 (*) <0.30 ng/mL  GLUCOSE, CAPILLARY     Status: Normal   Collection Time   01/05/12 11:56 AM      Component Value Range   Glucose-Capillary 73  70 - 99 mg/dL  URINALYSIS, ROUTINE W REFLEX MICROSCOPIC     Status: Normal   Collection Time   01/05/12 12:57 PM      Component Value Range   Color, Urine YELLOW  YELLOW   APPearance CLEAR  CLEAR    Specific Gravity, Urine 1.010  1.005 - 1.030   pH 7.0  5.0 - 8.0   Glucose, UA NEGATIVE  NEGATIVE mg/dL   Hgb urine dipstick NEGATIVE  NEGATIVE   Bilirubin Urine NEGATIVE  NEGATIVE   Ketones, ur NEGATIVE  NEGATIVE mg/dL   Protein, ur NEGATIVE  NEGATIVE mg/dL   Urobilinogen, UA 0.2  0.0 - 1.0 mg/dL   Nitrite NEGATIVE  NEGATIVE   Leukocytes, UA NEGATIVE  NEGATIVE  URINE RAPID DRUG SCREEN (HOSP PERFORMED)     Status: Normal   Collection Time   01/05/12 12:58 PM      Component Value Range   Opiates NONE DETECTED  NONE DETECTED   Cocaine NONE DETECTED  NONE DETECTED   Benzodiazepines NONE DETECTED  NONE DETECTED   Amphetamines NONE DETECTED  NONE DETECTED   Tetrahydrocannabinol NONE DETECTED  NONE DETECTED   Barbiturates NONE DETECTED  NONE DETECTED    Radiology/Studies: Ct Head Wo Contrast  01/05/2012  *RADIOLOGY REPORT*  Clinical Data: No known injury.  Right-sided weakness.  Difficulty with speaking for past 24 hours.  Hypertensive hyperlipidemic patient.  CT HEAD WITHOUT CONTRAST  Technique:  Contiguous axial images were obtained from the base of the skull through the vertex without contrast.  Comparison: 01/30/2008.  Findings: No intracranial hemorrhage.  Small vessel disease type changes without CT evidence of large acute infarct.  Global atrophy without hydrocephalus.  No intracranial mass lesion detected on this unenhanced exam.  Vascular calcifications.  IMPRESSION:  No intracranial hemorrhage.  Small vessel disease type changes without CT evidence of large acute infarct.   Original Report Authenticated By: Fuller Canada, M.D.     EKG: 10:39  Afib.  Sl ST elevation inferior leads Atrial fibrillation 107 bpm.    Nonspecific ST T wave changes.  ASSESSMENT AND PLAN:  Patient is a 78 yo with history of valvular heart disease, atrial fibrillation (not on coumadin) who presents with TIA/CVA.  Had some neck pain earlier but now gone.  I am not convinced that was angina. Work up signif  for Trop on 3. On exam, the patietn appears comfortable  Lungs are clear.  Cardiac exam with murmur consistent with AS.  MS murmur difficult to hear.   EKG with subtle EKG changes inferiorly on arrival, gone on repeat.  Given neurologic problems, age and the fact that the patient is pain free I would recomm conservative Rx. Patient on heparin now.  Anticoag. Long term will need to be discussed.  Not sure why not on it (?falls) Would cycle cardiac markers  2.  Afib.  Follow on tele  Rate control  Heparin  3.  Valvular heart disease.  WOuld sched echo to redefine.  4.  HTN  FOllow.  Neuro to set parameters for BP now.  Signed, Dietrich Pates 01/05/2012, 5:57 PM

## 2012-01-05 NOTE — Progress Notes (Signed)
ANTICOAGULATION CONSULT NOTE - Initial Consult  Pharmacy Consult for Heparin Indication: chest pain/ACS  No Known Allergies  Patient Measurements: Height: 5\' 2"  (157.5 cm) Weight: 140 lb (63.504 kg) IBW/kg (Calculated) : 50.1  Heparin Dosing Weight: 63.5 kg  Vital Signs: Temp: 98.1 F (36.7 C) (10/10 1313) Temp src: Oral (10/10 1313) BP: 169/78 mmHg (10/10 1313) Pulse Rate: 79  (10/10 1313)  Labs:  Basename 01/05/12 1153  HGB 10.8*  HCT 33.6*  PLT 203  APTT 30  LABPROT 12.9  INR 0.98  HEPARINUNFRC --  CREATININE 1.27*  CKTOTAL --  CKMB --  TROPONINI 3.06*    Estimated Creatinine Clearance: 24.8 ml/min (by C-G formula based on Cr of 1.27).   Medical History: Past Medical History  Diagnosis Date  . Hyperlipidemia   . Osteoporosis   . Hypertension     with severe left ventricular hypertrophy; normal ejection fraction in 04/2005  . Valvular heart disease     Mild to moderate aortic stenosis; moderate to severe mitral stenosis in 2007; mild MR  . Paroxysmal atrial fibrillation     Remote  . Diverticulosis     Pancolonic; h/o LGI bleeding and diverticulitis  . Degenerative joint disease     Knees  . Chronic kidney disease     Mild; creatinine of 1.19-1.28 in recent years  . Anemia, iron deficiency   . Popliteal cyst     Left  . Fracture of wrist 2008    left  . Gout     Medications:  Scheduled:    . aspirin  325 mg Oral Once  . heparin  3,000 Units Intravenous Once    Assessment: Elevated troponin Platelets 203 Reduced renal function  Goal of Therapy:  Heparin level 0.3-0.7 units/ml Monitor platelets by anticoagulation protocol: Yes   Plan:  Heparin 3000 Unit bolus, then Heparin 750 units/hour (12 units/kg/hr) Heparin level in 8 hours, then daily Monitor platelets Labs per protocol   Raquel James, Briena Swingler Bennett 01/05/2012,2:13 PM

## 2012-01-05 NOTE — ED Provider Notes (Addendum)
History  This chart was scribed for Judy Booze, MD by Bennett Scrape. This patient was seen in room APA04/APA04 and the patient's care was started at 10:48AM.  CSN: [id number]  Arrival date & time 01/05/12  1032   First MD Initiated Contact with Patient 01/05/12 1048     Level 5 Caveat-Aphasia   Chief Complaint  Patient presents with  . Weakness    The history is provided by the patient. No language interpreter was used.   Judy Grant is a 78 y.o. female brought in by ambulance, who presents to the Emergency Department complaining of weakness in the right arm and had difficulty speaking that family member reports was noticed by the nursing staff at her nursing home this morning. Pt reports that the last time she was able to move her right arm was yesterday but is unable to provide more information. She denies being on blood thinners or ASA currently. Family member denies that the pt has a h/o prior TIAs or CVAs. She has a h/o HLD, osteoporosis, HTN and A. Fib. She is a former smoker but denies alcohol use.    Dr. Lodema Hong is PCP.   Past Medical History  Diagnosis Date  . Hyperlipidemia   . Osteoporosis   . Hypertension     with severe left ventricular hypertrophy; normal ejection fraction in 04/2005  . Valvular heart disease     Mild to moderate aortic stenosis; moderate to severe mitral stenosis in 2007; mild MR  . Paroxysmal atrial fibrillation     Remote  . Diverticulosis     Pancolonic; h/o LGI bleeding and diverticulitis  . Degenerative joint disease     Knees  . Chronic kidney disease     Mild; creatinine of 1.19-1.28 in recent years  . Anemia, iron deficiency   . Popliteal cyst     Left  . Fracture of wrist 2008    left  . Gout     Past Surgical History  Procedure Date  . Appendectomy 1944  . Total hip arthroplasty 1994    Family History  Problem Relation Age of Onset  . Diabetes Mother     History  Substance Use Topics  . Smoking status:  Former Smoker    Quit date: 08/31/1972  . Smokeless tobacco: Never Used  . Alcohol Use: No    No OB history provided.  Review of Systems  Unable to perform ROS: Other  Aphasia   Allergies  Review of patient's allergies indicates no known allergies.  Home Medications   Current Outpatient Rx  Name Route Sig Dispense Refill  . ALENDRONATE SODIUM 70 MG PO TABS  Take with a full glass of water on an empty stomach. 4 tablet 5  . ALLOPURINOL 100 MG PO TABS Oral Take 100 mg by mouth daily.    Marland Kitchen CALCIUM CARB-CHOLECALCIFEROL 500-400 MG-UNIT PO TABS Oral Take 1 tablet by mouth 3 (three) times daily.     . OYSTER SHELL CALCIUM/D 500-200 MG-UNIT PO TABS  TAKE (1) TABLET BY MOUTH (3) TIMES DAILY. 90 each 5  . DOCUSATE SODIUM 50 MG PO CAPS Oral Take by mouth 2 (two) times daily.     Marland Kitchen FLUTICASONE PROPIONATE 50 MCG/ACT NA SUSP Nasal Place 2 sprays into the nose daily.     . FUROSEMIDE 40 MG PO TABS Oral Take 1 tablet (40 mg total) by mouth daily. 30 tablet 5  . HYDROCODONE-ACETAMINOPHEN 5-500 MG PO TABS  One twice daily for uncontrolled arthritic pain  30 tablet 3    Please dispense 2 week supplies each time  . METOPROLOL TARTRATE 50 MG PO TABS Oral Take 50-75 mg by mouth 2 (two) times daily. TAKE 1 AND 1/2 TABLETS (75mg ) BYMOUTH IN THE MORNING AND1 TABLET (50mg ) IN THE EVENING.    Marland Kitchen POLYETHYLENE GLYCOL 3350 PO PACK Oral Take 17 g by mouth daily.    Marland Kitchen POTASSIUM CHLORIDE CRYS ER 10 MEQ PO TBCR Oral Take 1 tablet (10 mEq total) by mouth as directed. 45 tablet 5    Take 1 tablet daily except take 2 tablets daily on ...  . SIMVASTATIN 20 MG PO TABS Oral Take 20 mg by mouth at bedtime. FOR CHOLESTEROL.    Marland Kitchen VALSARTAN 80 MG PO TABS Oral Take 80 mg by mouth daily.      Triage Vitals: BP 192/86  Pulse 78  Temp 98.1 F (36.7 C) (Oral)  Resp 15  Ht 5\' 2"  (1.575 m)  Wt 140 lb (63.504 kg)  BMI 25.61 kg/m2  SpO2 98%  Physical Exam  Nursing note and vitals reviewed. Constitutional: She is oriented  to person, place, and time. She appears well-developed and well-nourished. No distress.  HENT:  Head: Normocephalic and atraumatic.  Eyes: Conjunctivae normal and EOM are normal.  Neck: Neck supple. No tracheal deviation present.  Cardiovascular: Normal rate and regular rhythm.   Murmur heard.      3/6 systolic injection murmur in the arotic area  Pulmonary/Chest: Effort normal and breath sounds normal. No respiratory distress.  Abdominal: Soft. She exhibits no distension.  Musculoskeletal: Normal range of motion.       Short leg cast on left leg  Neurological: She is alert and oriented to person, place, and time.       dysarthric speech, unable to speak in full sentences, mild right facial droop, right arm weakness and leg weakness 3/5, no Babinski response  Skin: Skin is warm and dry.  Psychiatric: She has a normal mood and affect. Her behavior is normal.    ED Course  Procedures (including critical care time)  DIAGNOSTIC STUDIES: Oxygen Saturation is 98% on room air, normal by my interpretation.    COORDINATION OF CARE: 11:28AM-Discussed treatment plan which includes admission with pt and family at bedside and pt agreed to plan.  Results for orders placed during the hospital encounter of 01/05/12  PROTIME-INR      Component Value Range   Prothrombin Time 12.9  11.6 - 15.2 seconds   INR 0.98  0.00 - 1.49  APTT      Component Value Range   aPTT 30  24 - 37 seconds  CBC      Component Value Range   WBC 6.4  4.0 - 10.5 K/uL   RBC 3.63 (*) 3.87 - 5.11 MIL/uL   Hemoglobin 10.8 (*) 12.0 - 15.0 g/dL   HCT 69.6 (*) 29.5 - 28.4 %   MCV 92.6  78.0 - 100.0 fL   MCH 29.8  26.0 - 34.0 pg   MCHC 32.1  30.0 - 36.0 g/dL   RDW 13.2  44.0 - 10.2 %   Platelets 203  150 - 400 K/uL  DIFFERENTIAL      Component Value Range   Neutrophils Relative 69  43 - 77 %   Neutro Abs 4.4  1.7 - 7.7 K/uL   Lymphocytes Relative 24  12 - 46 %   Lymphs Abs 1.6  0.7 - 4.0 K/uL   Monocytes Relative 5  3 - 12 %   Monocytes Absolute 0.3  0.1 - 1.0 K/uL   Eosinophils Relative 2  0 - 5 %   Eosinophils Absolute 0.1  0.0 - 0.7 K/uL   Basophils Relative 1  0 - 1 %   Basophils Absolute 0.1  0.0 - 0.1 K/uL  COMPREHENSIVE METABOLIC PANEL      Component Value Range   Sodium 137  135 - 145 mEq/L   Potassium 4.2  3.5 - 5.1 mEq/L   Chloride 100  96 - 112 mEq/L   CO2 25  19 - 32 mEq/L   Glucose, Bld 82  70 - 99 mg/dL   BUN 25 (*) 6 - 23 mg/dL   Creatinine, Ser 1.61 (*) 0.50 - 1.10 mg/dL   Calcium 09.6 (*) 8.4 - 10.5 mg/dL   Total Protein 7.4  6.0 - 8.3 g/dL   Albumin 3.9  3.5 - 5.2 g/dL   AST 21  0 - 37 U/L   ALT 8  0 - 35 U/L   Alkaline Phosphatase 49  39 - 117 U/L   Total Bilirubin 0.6  0.3 - 1.2 mg/dL   GFR calc non Af Amer 36 (*) >90 mL/min   GFR calc Af Amer 41 (*) >90 mL/min  URINE RAPID DRUG SCREEN (HOSP PERFORMED)      Component Value Range   Opiates NONE DETECTED  NONE DETECTED   Cocaine NONE DETECTED  NONE DETECTED   Benzodiazepines NONE DETECTED  NONE DETECTED   Amphetamines NONE DETECTED  NONE DETECTED   Tetrahydrocannabinol NONE DETECTED  NONE DETECTED   Barbiturates NONE DETECTED  NONE DETECTED  TROPONIN I      Component Value Range   Troponin I 3.06 (*) <0.30 ng/mL  URINALYSIS, ROUTINE W REFLEX MICROSCOPIC      Component Value Range   Color, Urine YELLOW  YELLOW   APPearance CLEAR  CLEAR   Specific Gravity, Urine 1.010  1.005 - 1.030   pH 7.0  5.0 - 8.0   Glucose, UA NEGATIVE  NEGATIVE mg/dL   Hgb urine dipstick NEGATIVE  NEGATIVE   Bilirubin Urine NEGATIVE  NEGATIVE   Ketones, ur NEGATIVE  NEGATIVE mg/dL   Protein, ur NEGATIVE  NEGATIVE mg/dL   Urobilinogen, UA 0.2  0.0 - 1.0 mg/dL   Nitrite NEGATIVE  NEGATIVE   Leukocytes, UA NEGATIVE  NEGATIVE  GLUCOSE, CAPILLARY      Component Value Range   Glucose-Capillary 73  70 - 99 mg/dL   Ct Head Wo Contrast  01/05/2012  *RADIOLOGY REPORT*  Clinical Data: No known injury.  Right-sided weakness.  Difficulty with  speaking for past 24 hours.  Hypertensive hyperlipidemic patient.  CT HEAD WITHOUT CONTRAST  Technique:  Contiguous axial images were obtained from the base of the skull through the vertex without contrast.  Comparison: 01/30/2008.  Findings: No intracranial hemorrhage.  Small vessel disease type changes without CT evidence of large acute infarct.  Global atrophy without hydrocephalus.  No intracranial mass lesion detected on this unenhanced exam.  Vascular calcifications.  IMPRESSION:  No intracranial hemorrhage.  Small vessel disease type changes without CT evidence of large acute infarct.   Original Report Authenticated By: Fuller Canada, M.D.       Date: 01/05/2012  Rate: 78  Rhythm: normal sinus rhythm  QRS Axis: normal  Intervals: normal  ST/T Wave abnormalities: normal  Conduction Disutrbances:none  Narrative Interpretation: Old anteroseptal myocardial infarction. When compared with ECG of 02/04/2010, no significant changes are  seen  Old EKG Reviewed: unchanged    1. Stroke   2. Elevated troponin   3. Anemia   4. Renal insufficiency   5. Aortic stenosis    CRITICAL CARE Performed by: ZOXWR,UEAVW   Total critical care time: 45 minutes  Critical care time was exclusive of separately billable procedures and treating other patients.  Critical care was necessary to treat or prevent imminent or life-threatening deterioration.  Critical care was time spent personally by me on the following activities: development of treatment plan with patient and/or surrogate as well as nursing, discussions with consultants, evaluation of patient's response to treatment, examination of patient, obtaining history from patient or surrogate, ordering and performing treatments and interventions, ordering and review of laboratory studies, ordering and review of radiographic studies, pulse oximetry and re-evaluation of patient's condition.   MDM  Left hemisphere stroke with right hemiparesthesias and  Broca's Aphasia. She is not a code stroke candidate and not a candidate for thrombolytics because she was last seen normal sometime last night, which is more than 12 hours ago. Routine stroke workup has been initiated. Old records have been reviewed, and she has been evaluated for aortic stenosis with echocardiogram.  CT shows no evidence of hemorrhage. Troponin has come back elevated, so she is started on heparin. Findings were discussed with patient and family. She is full code, so therefore she is transferred to [facility].   Case is discussed with Dr. Cena Benton of [facility] who agrees to accept the patient in transfer.   I personally performed the services described in this documentation, which was scribed in my presence. The recorded information has been reviewed and considered.      Judy Booze, MD 01/05/12 1457  Judy Booze, MD 01/05/12 [phone]

## 2012-01-05 NOTE — ED Notes (Signed)
CRITICAL VALUE ALERT  Critical value received:  Troponin 3.06  Date of notification:  01-05-2012  Time of notification:  1347  Critical value read back:yes  Nurse who received alert:  Lesle Reek  MD notified (1st page):  1347  Time of first page:  1347  MD notified (2nd page):  Time of second page:  Responding MD:  glick  Time MD responded:  1347

## 2012-01-05 NOTE — ED Notes (Signed)
ems stated pt's family called 911 because pt was c/o right sided weakness/difficulty speaking x 24 hours. Pt is a/o x 4 on arrival to ed and is c/o right shoulder pan and difficulty speaking.

## 2012-01-05 NOTE — H&P (Signed)
[facility] History and Physical  Judy Grant VHQ:[id number] DOB: 03/20/1918 DOA: 01/05/2012   PCP: [name], MD  Specialists: Dr. Dietrich Pates    Chief Complaint:  Chief Complaint  Patient presents with  . Weakness     HPI: Judy Grant is a 78 y.o. female with hx of rheumatic heart ds and PAF, who started having R side weakness yesterday. EMS came to the house and left after they did not find anything wrong with the patient. When the home health nurse arrived this AM she found the patient aphasic and unable to move her right side. Patient was transported to [facility] Ed where a head CT did not indicate any acute abnormalities. Patient also had EKG changes of St elevation and Troponin was found to be 3.06 ng/ml so she got transferred to [facility]. She denies any chest pain or dyspnea. Currently her speech has returned. Patient was started on heparin drip at [facility] ED.    Review of Systems: The patient denies anorexia, fever, weight loss,, vision loss, decreased hearing, hoarseness, chest pain, syncope, dyspnea on exertion, peripheral edema, balance deficits, hemoptysis, abdominal pain, melena, hematochezia, severe indigestion/heartburn, hematuria, incontinence, genital sores, suspicious skin lesions, transient blindness, difficulty walking, depression, unusual weight change, abnormal bleeding, enlarged lymph nodes, angioedema, and breast masses.    Past Medical History  Diagnosis Date  . Hyperlipidemia   . Osteoporosis   . Hypertension     with severe left ventricular hypertrophy; normal ejection fraction in 04/2005  . Valvular heart disease     Mild to moderate aortic stenosis; moderate to severe mitral stenosis in 2007; mild MR  . Paroxysmal atrial fibrillation     Remote  . Diverticulosis     Pancolonic; h/o LGI bleeding and diverticulitis  . Degenerative joint disease     Knees  . Chronic kidney disease     Mild; creatinine of 1.19-1.28 in recent years  . Anemia,  iron deficiency   . Popliteal cyst     Left  . Fracture of wrist 2008    left  . Gout    Past Surgical History  Procedure Date  . Appendectomy 1944  . Total hip arthroplasty 1994   Social History:  reports that she quit smoking about 39 years ago. She has never used smokeless tobacco. She reports that she does not drink alcohol or use illicit drugs. Lives at home with family who care for her 24 hrs/day   No Known Allergies  Family History  Problem Relation Age of Onset  . Diabetes Mother     Prior to Admission medications   Medication Sig Start Date End Date Taking? Authorizing Provider  alendronate (FOSAMAX) 70 MG tablet Take with a full glass of water on an empty stomach. 09/12/11  Yes Kerri Perches, MD  allopurinol (ZYLOPRIM) 100 MG tablet Take 100 mg by mouth daily.   Yes Historical Provider, MD  Calcium Carb-Cholecalciferol (OYSTER SHELL CALCIUM + D) 500-400 MG-UNIT TABS Take 1 tablet by mouth 3 (three) times daily.    Yes Historical Provider, MD  docusate sodium (COLACE) 50 MG capsule Take by mouth 2 (two) times daily.    Yes Historical Provider, MD  fluticasone (FLONASE) 50 MCG/ACT nasal spray Place 2 sprays into the nose daily.    Yes Historical Provider, MD  furosemide (LASIX) 40 MG tablet Take 1 tablet (40 mg total) by mouth daily. 09/12/11  Yes Kerri Perches, MD  HYDROcodone-acetaminophen (VICODIN) 5-500 MG per tablet Take 1 tablet by  mouth 2 (two) times daily as needed. uncontrolled arthritic pain 12/16/11 03/16/12 Yes Kerri Perches, MD  metoprolol (LOPRESSOR) 50 MG tablet Take 50-75 mg by mouth 2 (two) times daily. TAKE 1 AND 1/2 TABLETS (75mg ) BYMOUTH IN THE MORNING AND1 TABLET (50mg ) IN THE EVENING. 09/12/11  Yes Kerri Perches, MD  polyethylene glycol [facility] / GLYCOLAX) packet Take 17 g by mouth daily.   Yes Historical Provider, MD  potassium chloride (KLOR-CON M10) 10 MEQ tablet Take 1 tablet (10 mEq total) by mouth as directed. 09/12/11  Yes Kerri Perches, MD  simvastatin (ZOCOR) 20 MG tablet Take 20 mg by mouth at bedtime. FOR CHOLESTEROL. 09/12/11  Yes Kerri Perches, MD  traMADol (ULTRAM) 50 MG tablet Take 50 mg by mouth every 6 (six) hours as needed. Pain.   Yes Historical Provider, MD  valsartan (DIOVAN) 80 MG tablet Take 80 mg by mouth daily. 09/12/11  Yes Kerri Perches, MD   Physical Exam: Filed Vitals:   01/05/12 1545 01/05/12 1548 01/05/12 1700 01/05/12 1830  BP: 159/75 159/75 155/88 183/86  Pulse: 82 80 84 70  Temp:   98.1 F (36.7 C)   TempSrc:   Oral   Resp:  16 17 17   Height:      Weight:   60.9 kg (134 lb 4.2 oz)   SpO2: 93% 99% 96% 99%     General:  Alert and oriented to place, time and situation   Eyes: perrla, eomi,   ENT: no pharyngeal exudates  Neck: No JVD  Cardiovascular: Irregularly irregular, 3/6 systolic murmur, no gallops  Respiratory: Clear to auscultation bilaterally  Abdomen: Soft nontender bowel sounds are present  Skin: Warm dry without rashes  Musculoskeletal: Decreased muscle bulk and tone, edema of the right shoulder with slight bruising and anterior protrusion  Psychiatric: Euthymic  Neurologic: Cranial nerves 2-12 intact, right hemiparesis 1/5 strength, left-sided strong 5 out of 5, speech is intact, comprehension is intact  Labs on Admission:  Basic Metabolic Panel:  Lab 01/05/12 1610  NA 137  K 4.2  CL 100  CO2 25  GLUCOSE 82  BUN 25*  CREATININE 1.27*  CALCIUM 10.7*  MG --  PHOS --   Liver Function Tests:  Lab 01/05/12 1153  AST 21  ALT 8  ALKPHOS 49  BILITOT 0.6  PROT 7.4  ALBUMIN 3.9   No results found for this basename: LIPASE:5,AMYLASE:5 in the last 168 hours No results found for this basename: AMMONIA:5 in the last 168 hours CBC:  Lab 01/05/12 1153  WBC 6.4  NEUTROABS 4.4  HGB 10.8*  HCT 33.6*  MCV 92.6  PLT 203   Cardiac Enzymes:  Lab 01/05/12 1153  CKTOTAL --  CKMB --  CKMBINDEX --  TROPONINI 3.06*    BNP (last 3  results) No results found for this basename: PROBNP:3 in the last 8760 hours CBG:  Lab 01/05/12 1156  GLUCAP 73    Radiological Exams on Admission: Ct Head Wo Contrast  01/05/2012  *RADIOLOGY REPORT*  Clinical Data: No known injury.  Right-sided weakness.  Difficulty with speaking for past 24 hours.  Hypertensive hyperlipidemic patient.  CT HEAD WITHOUT CONTRAST  Technique:  Contiguous axial images were obtained from the base of the skull through the vertex without contrast.  Comparison: 01/30/2008.  Findings: No intracranial hemorrhage.  Small vessel disease type changes without CT evidence of large acute infarct.  Global atrophy without hydrocephalus.  No intracranial mass lesion detected on this unenhanced  exam.  Vascular calcifications.  IMPRESSION:  No intracranial hemorrhage.  Small vessel disease type changes without CT evidence of large acute infarct.   Original Report Authenticated By: Fuller Canada, M.D.    Dg Shoulder Right Port  01/05/2012  *RADIOLOGY REPORT*  Clinical Data: Shoulder pain and deformity.  PORTABLE RIGHT SHOULDER - 2+ VIEW  Comparison: None.  Findings: No evidence of acute fracture.  Chronic deformity with remodeling of the right humeral head is seen, which may be due to old fracture or avascular necrosis.  There is mild anterior subluxation of the humeral head in relation to the glenoid which is also likely chronic.  Glenoid is poorly profiled on this exam and is difficult to evaluate.  There is mild degenerative spurring of the acromioclavicular joint.  Generalized osteopenia noted.  IMPRESSION:  1.  No acute findings. 2.  Chronic deformity and remodeling of the right humeral head with mild anterior subluxation.  This may be due to old fracture or avascular necrosis. 3.  Mild acromioclavicular degenerative spurring. 4.  Osteopenia.   Original Report Authenticated By: Danae Orleans, M.D.     EKG: Independently reviewed. ST elevation of 1 mm in 2, 3 aVF as well as  V1  Assessment/Plan Principal Problem:  *NSTEMI, initial episode of care Active Problems:  ANEMIA-IRON DEFICIENCY  Hyperlipidemia  Hypertension  CVA (cerebral infarction)  Moderate aortic stenosis  Moderate mitral stenosis  New onset a-fib  CKD (chronic kidney disease) stage 3, GFR 30-59 ml/min  Swelling of joint of right shoulder   1. Non-ST elevation myocardial infarction - unclear the exact sequence of events. Patient may have had an MI, formed a left ventricular clot and had an embolic stroke. Alternatively she could have a large MCA stroke and have a subendocardial MI related to the stroke. She is chest pain-free at the moment. Plan to continue IV heparin drip if okay with neurology. Discussed the case with the [facility] cardiologist on call Dr. Dietrich Pates - and we will not proceed with emergent cardiac catheterization due to lack of chest pain, repeat EKG without progression of ST elevation and ongoing acute stroke. 2. A. fib with RVR-we'll use metoprolol IV for rate control. Full dose heparin if okay with neurology. 3. New subacute stroke probably left MCA territory. Patient to have an MRI of the brain for better delineation of the etiology. Neurology consultation has been obtained. Daily aspirin has been started 4. Chronic right shoulder swelling-x-ray confirms remodeling of the right humeral head with anterior subluxation-this is not an acute finding. 5. Hyperlipidemia -- we'll continue statin 6. Moderate aortic and mitral valve stenosis-we'll repeat echocardiogram 7. Further plans depend on how the patient progresses  Code Status: The family requested that we do keep the patient full code  Family Communication: Evangelia, [place] Daughter [phone]  Disposition Plan: Patient will be in the hospital for at least 4 days. She probably will require rehabilitation post acute admission  Time spent: 50 minutes  Judy Grant [facility] Pager [phone]  If  7PM-7AM, please contact night-coverage [email or web address] Password [email or web address] 01/05/2012, 7:21 PM

## 2012-01-06 ENCOUNTER — Inpatient Hospital Stay (HOSPITAL_COMMUNITY): Payer: PRIVATE HEALTH INSURANCE

## 2012-01-06 DIAGNOSIS — I059 Rheumatic mitral valve disease, unspecified: Secondary | ICD-10-CM

## 2012-01-06 LAB — LIPID PANEL
LDL Cholesterol: 60 mg/dL (ref 0–99)
VLDL: 11 mg/dL (ref 0–40)

## 2012-01-06 LAB — BASIC METABOLIC PANEL
BUN: 22 mg/dL (ref 6–23)
Creatinine, Ser: 1.21 mg/dL — ABNORMAL HIGH (ref 0.50–1.10)
GFR calc Af Amer: 44 mL/min — ABNORMAL LOW (ref >90)
GFR calc non Af Amer: 38 mL/min — ABNORMAL LOW (ref >90)

## 2012-01-06 LAB — CBC
MCHC: 31.6 g/dL (ref 30.0–36.0)
RDW: 12.6 % (ref 11.5–15.5)

## 2012-01-06 LAB — HEPARIN LEVEL (UNFRACTIONATED): Heparin Unfractionated: 0.32 IU/mL (ref 0.30–0.70)

## 2012-01-06 LAB — TROPONIN I: Troponin I: 2.56 ng/mL (ref <0.30)

## 2012-01-06 LAB — GLUCOSE, CAPILLARY: Glucose-Capillary: 96 mg/dL (ref 70–99)

## 2012-01-06 MED ORDER — METOPROLOL TARTRATE 25 MG PO TABS
25.0000 mg | ORAL_TABLET | Freq: Two times a day (BID) | ORAL | Status: DC
  Administered 2012-01-06 – 2012-01-07 (×3): 25 mg via ORAL
  Filled 2012-01-06 (×4): qty 1

## 2012-01-06 MED ORDER — WARFARIN SODIUM 2.5 MG PO TABS
2.5000 mg | ORAL_TABLET | Freq: Once | ORAL | Status: AC
  Administered 2012-01-06: 2.5 mg via ORAL
  Filled 2012-01-06: qty 1

## 2012-01-06 MED ORDER — WARFARIN VIDEO: Freq: Once | Status: DC

## 2012-01-06 MED ORDER — COUMADIN BOOK
Freq: Once | Status: AC
  Administered 2012-01-08: 18:00:00
  Filled 2012-01-06: qty 1

## 2012-01-06 MED ORDER — HEPARIN (PORCINE) IN NACL 100-0.45 UNIT/ML-% IJ SOLN
750.0000 [IU]/h | INTRAMUSCULAR | Status: DC
  Administered 2012-01-07: 750 [IU]/h via INTRAVENOUS
  Filled 2012-01-06 (×3): qty 250

## 2012-01-06 MED ORDER — WARFARIN - PHARMACIST DOSING INPATIENT: Freq: Every day | Status: DC

## 2012-01-06 NOTE — Progress Notes (Signed)
Echocardiogram 2D Echocardiogram has been performed.  Judy Grant 01/06/2012, 10:06 AM

## 2012-01-06 NOTE — Progress Notes (Signed)
VASCULAR LAB PRELIMINARY  PRELIMINARY  PRELIMINARY  PRELIMINARY  Carotid duplex  completed.    Preliminary report:  Bilateral:  No evidence of hemodynamically significant internal carotid artery stenosis.   Vertebral artery flow is antegrade.      Jae Skeet, RVT 01/06/2012, 2:27 PM

## 2012-01-06 NOTE — Progress Notes (Signed)
Stroke Team Progress Note  HISTORY  Judy Grant is an 78 y.o. female who was admitted today with right sided weakness and aphasia that started yesterday, unknown time of onset. She arrived in ER today around 1032. She has had neck pain earlier today but no chest pain. Her EKG shows minimally elevated inf-lat changes with intermittent a-flutter. History of aortic stenosis.  Currently she is not aphasic. She does complain of right sided weakness and right visual changes. She has been started on heparin earlier in the day with no neurological worsening. Head CT negative for acute stroke or hemorrhage.   LSN: 01/04/2012  tPA Given: No: out of window  SUBJECTIVE  Patient lying in bed. Following commands. No family in room.  OBJECTIVE Most recent Vital Signs: Filed Vitals:   01/06/12 0458 01/06/12 0500 01/06/12 0600 01/06/12 0700  BP:  134/73 134/76 122/81  Pulse:  76 76 72  Temp:      TempSrc:      Resp:  12 13 19   Height:      Weight: 57.8 kg (127 lb 6.8 oz)     SpO2:  100% 100% 100%   CBG (last 3)   Basename 01/06/12 0557 01/05/12 2334 01/05/12 1933  GLUCAP 96 85 79    IV Fluid Intake:     . heparin 750 Units/hr (01/06/12 0047)  . DISCONTD: heparin 750 Units/hr (01/05/12 1440)    MEDICATIONS    . aspirin  324 mg Oral NOW   Or  . aspirin  300 mg Rectal NOW  . aspirin EC  81 mg Oral Daily  . aspirin  325 mg Oral Once  . heparin  3,000 Units Intravenous Once  . metoprolol  5 mg Intravenous Q8H  . simvastatin  20 mg Oral q1800  . sodium chloride  3 mL Intravenous Q12H   PRN:  sodium chloride, acetaminophen, morphine injection, nitroGLYCERIN, ondansetron (ZOFRAN) IV, sodium chloride, DISCONTD: ondansetron (ZOFRAN) IV  Diet:   Heart healthy, thin Activity:  Out of bed with assistance DVT Prophylaxis:  Heparin drip  CLINICALLY SIGNIFICANT STUDIES Basic Metabolic Panel:  Lab 01/06/12 1610 01/05/12 1153  NA 138 137  K 4.5 4.2  CL 102 100  CO2 22 25  GLUCOSE  89 82  BUN 22 25*  CREATININE 1.21* 1.27*  CALCIUM 10.4 10.7*  MG -- --  PHOS -- --   Liver Function Tests:  Lab 01/05/12 1153  AST 21  ALT 8  ALKPHOS 49  BILITOT 0.6  PROT 7.4  ALBUMIN 3.9   CBC:  Lab 01/06/12 0508 01/05/12 1153  WBC 6.1 6.4  NEUTROABS -- 4.4  HGB 10.8* 10.8*  HCT 34.2* 33.6*  MCV 92.2 92.6  PLT 183 203   Coagulation:  Lab 01/05/12 1153  LABPROT 12.9  INR 0.98   Cardiac Enzymes:  Lab 01/06/12 0508 01/05/12 1732 01/05/12 1153  CKTOTAL -- -- --  CKMB -- -- --  CKMBINDEX -- -- --  TROPONINI 2.56* 3.29* 3.06*   Urinalysis:  Lab 01/05/12 1257  COLORURINE YELLOW  LABSPEC 1.010  PHURINE 7.0  GLUCOSEU NEGATIVE  HGBUR NEGATIVE  BILIRUBINUR NEGATIVE  KETONESUR NEGATIVE  PROTEINUR NEGATIVE  UROBILINOGEN 0.2  NITRITE NEGATIVE  LEUKOCYTESUR NEGATIVE   Lipid Panel    Component Value Date/Time   CHOL 138 01/06/2012 0508   TRIG 57 01/06/2012 0508   HDL 67 01/06/2012 0508   CHOLHDL 2.1 01/06/2012 0508   VLDL 11 01/06/2012 0508   LDLCALC 60 01/06/2012 0508  HgbA1C  No results found for this basename: HGBA1C    Urine Drug Screen:     Component Value Date/Time   LABOPIA NONE DETECTED 01/05/2012 1258   COCAINSCRNUR NONE DETECTED 01/05/2012 1258   LABBENZ NONE DETECTED 01/05/2012 1258   AMPHETMU NONE DETECTED 01/05/2012 1258   THCU NONE DETECTED 01/05/2012 1258   LABBARB NONE DETECTED 01/05/2012 1258      Ct Head Wo Contrast 01/05/2012  No intracranial hemorrhage.  Small vessel disease type changes without CT evidence of large acute infarct.     Dg Shoulder Right Port 01/05/2012   1.  No acute findings. 2.  Chronic deformity and remodeling of the right humeral head with mild anterior subluxation.  This may be due to old fracture or avascular necrosis. 3.  Mild acromioclavicular degenerative spurring. 4.  Osteopenia.   Original Report Authenticated By: Danae Orleans, M.D.     MRI of the brain Small areas of acute infarction parietal lobe  bilaterally. Probable artifact rather than acute infarct in the left pons.    MRA of the brain  Severe stenosis in the parietal branch of the left middle cerebral artery. This may account for the acute infarct on the left   2D Echocardiogram    Carotid Doppler    CXR  Cardiomegaly and small left pleural effusion  EKG  normal sinus rhythm with intermittent aflutter with minimal st elevation inferior lateral leads, elevated trop with patient asymptomatic  Therapy Recommendations PT - ; OT - ; ST -   Physical Exam   Frail elderly African American lady not in distress.Awake alert. Afebrile. Head is nontraumatic. Neck is supple without bruit. Hearing is normal. Cardiac exam no murmur or gallop. Lungs are clear to auscultation. Distal pulses are well felt.  Neurological Exam : awake alert disoriented. Diminished attention, registration, short-term memory or recall. Speech is extremely garbled and barely understood. Unable to tell me her name or follow simple commands. She can follow gaze in all directions. She blinks to threat bilaterally. She's not very cooperative for detailed exam. Is no facial symmetry. Tongue is midline. She's able to move all 4 extremities against gravity but does not cooperative for testing drift however detailed muscle strength. Deep tendon for syncopal symmetric. She withdraws all her extremities to touch and pin. Plantars are both withdraws. Sensation, coordination and gait cannot be tested. ASSESSMENT Ms. Judy Grant is a 78 y.o. female presenting with right hemiparesis. Imaging confirms a acute infarction parietal lobe bilaterally with severe stenosis in the parietal branch of the left middle cerebral artery infarct. Infarct felt to be embolic secondary to atrial flutter.  Work up underway. On no anticoagulants prior to admission. Now on aspirin 81 mg orally every day for secondary stroke prevention. Patient with resultant right hemiparesis.   Imaging confirms a  acute infarction parietal lobe bilaterally with severe stenosis in the parietal branch of the left middle cerebral artery infarct---on heparin, baby aspirin  Hyperlipidemia  Hypertension  Moderated aortic stenosis  Atrial flutter  NSTEMI, elevated troponin, placed on heparin  Hospital day # 1  TREATMENT/PLAN  Continue aspirin 81 mg orally every day for secondary stroke prevention.  Patient currently on heparin. Dr. Pearlean Brownie to contact Dr. Graciela Husbands to discuss options including coumadin with this patient and the need for continuation of heparin at this time in the setting of stroke and NSTEMI.  Risk factor modification  2D echo, carotid dopplers pending  FULL CODE STATUS  PT/OT therapies  Guy Franco, PAC,  MBA, MHA [facility] Pager: [phone] 01/06/2012 7:53 AM  Scribe for Dr. Delia Heady, Stroke Center Medical Director. He has personally reviewed chart, pertinent data, examined the patient and developed the plan of care. Pager:  [phone]

## 2012-01-06 NOTE — Evaluation (Signed)
Physical Therapy Evaluation Patient Details Name: Judy Grant MRN: [id number] DOB: Nov 11, 1917 Today's Date: 01/06/2012 Time: 4098-1191 PT Time Calculation (min): 36 min  PT Assessment / Plan / Recommendation Clinical Impression  Patient is a 78 yo female admitted with right sided weakness and aphasia.  Speech now clear.  Right hemiparesis continues, impacting functional mobility.  Patient also with injury to LLE on 12/01/11 - has cam walker - no weight bearing restrictions noted in ED report.  Patient will require 24 hour assist and continued therapy at discharge, therefore recommend SNF.  Patient will benefit from acute PT to maximize independence and prepare for therapy in next venue of care.    PT Assessment  Patient needs continued PT services    Follow Up Recommendations  Post acute inpatient;Supervision/Assistance - 24 hour    Does the patient have the potential to tolerate intense rehabilitation   No, Recommend SNF  Barriers to Discharge Decreased caregiver support      Equipment Recommendations  None recommended by PT    Recommendations for Other Services     Frequency Min 4X/week    Precautions / Restrictions Precautions Precautions: Fall Precaution Comments: Recent left foot fracture (nondisplaced medial malleolus per ED report on 12/01/11).  Has cam boot for mobility.  No weightbearing restrictions indicated in ED report of 12/01/11.Marland Kitchen Required Braces or Orthoses: Other Brace/Splint Other Brace/Splint: cam boot for left foot Restrictions Weight Bearing Restrictions: No   Pertinent Vitals/Pain       Mobility  Bed Mobility Bed Mobility: Supine to Sit;Sitting - Scoot to Edge of Bed Supine to Sit: 4: Min assist;With rails Sitting - Scoot to Edge of Bed: 4: Min guard;With rail Details for Bed Mobility Assistance: Verbal cues for technique. Transfers Transfers: Sit to Stand;Stand to Sit Sit to Stand: 3: Mod assist;With upper extremity assist;From bed;From  toilet Stand to Sit: 4: Min assist;With upper extremity assist;To toilet;With armrests;To chair/3-in-1 Details for Transfer Assistance: Verbal cues for technique and hand placement.  Assist needed for placement of right hand on RW once standing, and on grab bar in bathroom when standing. Ambulation/Gait Ambulation/Gait Assistance: 4: Min assist Ambulation Distance (Feet): 24 Feet (with one sitting rest break) Assistive device: Rolling walker Ambulation/Gait Assistance Details: Cues for safe use of RW, with physical assist needed during turns.  Verbal cues to stand upright.  Noted decreased control of RLE during stance phase of gait. Gait Pattern: Step-through pattern;Decreased stance time - right;Decreased step length - left;Decreased weight shift to right;Right flexed knee in stance;Trunk flexed Gait velocity: Slow gait speed Stairs: No Modified Rankin (Stroke Patients Only) Pre-Morbid Rankin Score: No symptoms (Prior to left foot injury) Modified Rankin: Moderately severe disability           PT Diagnosis: Difficulty walking;Generalized weakness;Acute pain  PT Problem List: Decreased strength;Decreased activity tolerance;Decreased balance;Decreased mobility;Decreased knowledge of use of DME;Pain PT Treatment Interventions: DME instruction;Gait training;Functional mobility training;Balance training;Neuromuscular re-education;Patient/family education   PT Goals Acute Rehab PT Goals PT Goal Formulation: With patient Time For Goal Achievement: 01/20/12 Potential to Achieve Goals: Good Pt will go Supine/Side to Sit: with supervision;with HOB 0 degrees PT Goal: Supine/Side to Sit - Progress: Goal set today Pt will go Sit to Supine/Side: with supervision;with HOB 0 degrees PT Goal: Sit to Supine/Side - Progress: Goal set today Pt will go Sit to Stand: with supervision;with upper extremity assist PT Goal: Sit to Stand - Progress: Goal set today Pt will go Stand to Sit: with  supervision;with upper extremity assist  PT Goal: Stand to Sit - Progress: Goal set today Pt will Transfer Bed to Chair/Chair to Bed: with supervision PT Transfer Goal: Bed to Chair/Chair to Bed - Progress: Goal set today Pt will Ambulate: 51 - 150 feet;with supervision;with rolling walker PT Goal: Ambulate - Progress: Goal set today  Visit Information  Last PT Received On: 01/06/12 Assistance Needed: +1    Subjective Data  Subjective: "I hurt my foot about 2 weeks ago" "My right hand feels numb" Patient Stated Goal: To walk   Prior Functioning  Home Living Lives With: Son;Other (Comment) (Son is special needs.  Aide assists him as well as patient) Available Help at Discharge: Personal care attendant;Other (Comment) (5 days/week, 8 hours/day) Type of Home: Mobile home Home Access: Stairs to enter;Ramped entrance Home Layout: One level Bathroom Shower/Tub: Engineer, manufacturing systems: Handicapped height Bathroom Accessibility: Yes How Accessible: Accessible via walker Home Adaptive Equipment: Walker - rolling;Wheelchair - powered Prior Function Level of Independence: Independent with assistive device(s);Needs assistance Needs Assistance: Bathing;Meal Prep;Light Housekeeping Bath: Minimal Meal Prep: Moderate Light Housekeeping: Maximal Driving: No Comments: Reports aide drives her to grocery store and for errands. Communication Communication: No difficulties Dominant Hand: Right    Cognition  Overall Cognitive Status: Appears within functional limits for tasks assessed/performed Arousal/Alertness: Awake/alert Orientation Level: Appears intact for tasks assessed Behavior During Session: [facility] for tasks performed    Extremity/Trunk Assessment Right Upper Extremity Assessment RUE ROM/Strength/Tone: Deficits RUE ROM/Strength/Tone Deficits: See OT note RUE Sensation: WFL - Light Touch (Patient reports hand feels "numb") Left Upper Extremity Assessment LUE  ROM/Strength/Tone: WFL for tasks assessed LUE Sensation: WFL - Light Touch Right Lower Extremity Assessment RLE ROM/Strength/Tone: Deficits RLE ROM/Strength/Tone Deficits: Strength grossly 4-/5 RLE Sensation: WFL - Light Touch RLE Coordination: Deficits RLE Coordination Deficits: Decreased heel-to-shin Left Lower Extremity Assessment LLE ROM/Strength/Tone: WFL for tasks assessed LLE Sensation: WFL - Light Touch Trunk Assessment Trunk Assessment: Kyphotic   Balance Balance Balance Assessed: Yes Dynamic Sitting Balance Dynamic Sitting - Balance Support: No upper extremity supported;Feet supported Dynamic Sitting - Level of Assistance: 5: Stand by assistance Dynamic Sitting - Comments: Patient able to reach across midline with supervision.  Maintains static balance independently. Static Standing Balance Static Standing - Balance Support: Bilateral upper extremity supported Static Standing - Level of Assistance: 4: Min assist Static Standing - Comment/# of Minutes: Patient requires assist to maintain balance during static standing.  Able to stand >4 minutes with UE's supported on RW.  End of Session PT - End of Session Equipment Utilized During Treatment: Gait belt;Other (comment) (Cam walker LLE) Activity Tolerance: Patient limited by fatigue;Patient limited by pain Patient left: in chair;with call bell/phone within reach;with family/visitor present Nurse Communication: Mobility status  GP     Vena Austria 01/06/2012, 6:35 PM Durenda Hurt. Renaldo Fiddler, [facility] Pager [phone]

## 2012-01-06 NOTE — Progress Notes (Signed)
Patient Name: Judy Grant      SUBJECTIVE: admitted with stuttering stroke R weakness and aphasia with MRI>> B/L strokes  Back on Hep Hx of presumed rheumatic heart disease w mild AS/MS found on arrival to be in AF fast now slowerTn 3 or so and trending down;( no previous Tn in EPIC)   Repeat echo pending but prev EF 75%.  With severe LVH   Past Medical History  Diagnosis Date  . Hyperlipidemia   . Osteoporosis   . Hypertension     with severe left ventricular hypertrophy; normal ejection fraction in 04/2005  . Valvular heart disease     Mild to moderate aortic stenosis; moderate to severe mitral stenosis in 2007; mild MR  . Paroxysmal atrial fibrillation     Remote  . Diverticulosis     Pancolonic; h/o LGI bleeding and diverticulitis  . Degenerative joint disease     Knees  . Chronic kidney disease     Mild; creatinine of 1.19-1.28 in recent years  . Anemia, iron deficiency   . Popliteal cyst     Left  . Fracture of wrist 2008    left  . Gout     PHYSICAL EXAM Filed Vitals:   01/06/12 0458 01/06/12 0500 01/06/12 0600 01/06/12 0700  BP:  134/73 134/76 122/81  Pulse:  76 76 72  Temp:      TempSrc:      Resp:  12 13 19   Height:      Weight: 127 lb 6.8 oz (57.8 kg)     SpO2:  100% 100% 100%    Well developed and nourished in no acute distress HENT normal Neck supple with JVP-flat Carotids brisk and full without bruits Clear Irregularly irregular rate and rhythm with controlled ventricular response, harsh 3/6 systolic m Abd-soft with active BS without hepatomegaly No Clubbing cyanosis edema Skin-warm and dry A & Oriented  Grossly normal sensory and motor function--moving all 4 extremeites    TELEMETRY: Reviewed telemetry pt in  Af Controlled ventricular response:    Intake/Output Summary (Last 24 hours) at 01/06/12 0754 Last data filed at 01/06/12 0700  Gross per 24 hour  Intake  249.8 ml  Output    875 ml  Net -625.2 ml    LABS: Basic  Metabolic Panel:  Lab 01/06/12 9629 01/05/12 1153  NA 138 137  K 4.5 4.2  CL 102 100  CO2 22 25  GLUCOSE 89 82  BUN 22 25*  CREATININE 1.21* 1.27*  CALCIUM 10.4 10.7*  MG -- --  PHOS -- --   Cardiac Enzymes:  Basename 01/06/12 0508 01/05/12 1732 01/05/12 1153  CKTOTAL -- -- --  CKMB -- -- --  CKMBINDEX -- -- --  TROPONINI 2.56* 3.29* 3.06*   CBC:  Lab 01/06/12 0508 01/05/12 1153  WBC 6.1 6.4  NEUTROABS -- 4.4  HGB 10.8* 10.8*  HCT 34.2* 33.6*  MCV 92.2 92.6  PLT 183 203   PROTIME:  Basename 01/05/12 1153  LABPROT 12.9  INR 0.98   Liver Function Tests:  Basename 01/05/12 1153  AST 21  ALT 8  ALKPHOS 49  BILITOT 0.6  PROT 7.4  ALBUMIN 3.9   No results found for this basename: LIPASE:2,AMYLASE:2 in the last 72 hours BNP: BNP (last 3 results) No results found for this basename: PROBNP:3 in the last 8760 hours D-Dimer: No results found for this basename: DDIMER:2 in the last 72 hours Hemoglobin A1C: No results found for this basename:  HGBA1C in the last 72 hours Fasting Lipid Panel:  Basename 01/06/12 0508  CHOL 138  HDL 67  LDLCALC 60  TRIG 57  CHOLHDL 2.1  LDLDIRECT --      ASSESSMENT AND PLAN:  Patient Active Hospital Problem List: NSTEMI, initial episode of care (01/05/2012)    yperlipidemia ()   Hypertension ()  * CVA (cerebral infarction) (01/05/2012)  * Mod Arotic stenosis   Moderate mitral stenosis (01/05/2012)   New onset a-fib (01/05/2012)   CKD (chronic kidney disease) stage 3, GFR 30-59 ml/min (01/05/2012)   Swelling of joint of right shoulder (01/05/2012)   A difficult scenario fro me to decipher with recurrent AF, CVA and + tn at a level suggesting myocardial process. Given her hypertrophy and Rapid AF to start, it probably was type 2 and as such anticoagulation with protection for recurrent thromboembolism would be most appropriate.  She does not fall, is independent at home, or has been, and is at high risk for  recurrent CVA;  With valvular disease we have no data for NOAC and so will start coumadin and will have to get neruo input as to how stable she will be  Await echo Signed, Sherryl Manges MD  01/06/2012

## 2012-01-06 NOTE — Evaluation (Signed)
Speech Language Pathology Evaluation Patient Details Name: Judy Grant MRN: [id number] DOB: [birth year] Today's Date: 01/06/2012 Time: 1440-1500 SLP Time Calculation (min): 20 min  Problem List:  Patient Active Problem List  Diagnosis  . ANEMIA-IRON DEFICIENCY  . ARTHRITIS, KNEES, BILATERAL  . Osteoporosis  . Hyperlipidemia  . Hypertension  . Diverticulosis  . Paroxysmal atrial fibrillation  . Chronic kidney disease  . Ankle fracture, left  . NSTEMI, initial episode of care  . CVA (cerebral infarction)  . Moderate aortic stenosis  . Moderate mitral stenosis  . New onset a-fib  . CKD (chronic kidney disease) stage 3, GFR 30-59 ml/min  . Swelling of joint of right shoulder  . Hemiplegia, unspecified, affecting dominant side   Past Medical History:  Past Medical History  Diagnosis Date  . Hyperlipidemia   . Osteoporosis   . Hypertension     with severe left ventricular hypertrophy; normal ejection fraction in 04/2005  . Valvular heart disease     Mild to moderate aortic stenosis; moderate to severe mitral stenosis in 2007; mild MR  . Paroxysmal atrial fibrillation     Remote  . Diverticulosis     Pancolonic; h/o LGI bleeding and diverticulitis  . Degenerative joint disease     Knees  . Chronic kidney disease     Mild; creatinine of 1.19-1.28 in recent years  . Anemia, iron deficiency   . Popliteal cyst     Left  . Fracture of wrist 2008    left  . Gout    Past Surgical History:  Past Surgical History  Procedure Date  . Appendectomy 1944  . Total hip arthroplasty 1994   HPI:  Judy Grant is an 78 y.o. female who was admitted today with right sided weakness and aphasia that started yesterday, unknown time of onset. She arrived in ER today around 1032. She has had neck pain earlier today but no chest pain. Her EKG shows minimally elevated inf-lat changes with intermittent a-flutter. History of aortic stenosis. MRI shows small bilateral parietal  embolic infarcts.    Assessment / Plan / Recommendation Clinical Impression  Overall pt appears to have returned to baseline with speech and aphasia though pt reports aphasia yesterday. WIth more comlpex verbal problem solving tasks pt struggles with responses and requires max assist. Otherwise pt does not presents with any cognitive deficits. Suspect pt is at baseline for cogntition. Given that pt will have adequate support at home for higher level tasks will not recommend any SLP f/u. SLP will sign off.     SLP Assessment  Patient does not need any further Speech Lanaguage Pathology Services    Follow Up Recommendations       Frequency and Duration        Pertinent Vitals/Pain NA   SLP Goals     SLP Evaluation Prior Functioning  Cognitive/Linguistic Baseline: Within functional limits Type of Home: Mobile home Lives With: Son;Other (Comment) Available Help at Discharge: Personal care attendant;Other (Comment)   Cognition  Overall Cognitive Status: Impaired Arousal/Alertness: Awake/alert Orientation Level: Oriented X4 Attention: Focused;Sustained;Selective Focused Attention: Appears intact Sustained Attention: Appears intact Selective Attention: Appears intact Memory: Appears intact Awareness: Appears intact Problem Solving: Impaired Problem Solving Impairment: Verbal complex Executive Function: Reasoning Reasoning: Impaired Reasoning Impairment: Verbal complex Safety/Judgment: Appears intact    Comprehension  Auditory Comprehension Overall Auditory Comprehension: Appears within functional limits for tasks assessed    Expression Verbal Expression Overall Verbal Expression: Appears within functional limits for tasks assessed Initiation:  No impairment Automatic Speech: Name;Social Response Level of Generative/Spontaneous Verbalization: Conversation Repetition: No impairment Naming: No impairment Pragmatics: No impairment   Oral / Motor Oral Motor/Sensory  Function Overall Oral Motor/Sensory Function: Appears within functional limits for tasks assessed Motor Speech Overall Motor Speech: Appears within functional limits for tasks assessed   GO    Harlon Ditty, MA CCC-SLP [phone]  Claudine Mouton 01/06/2012, 3:15 PM

## 2012-01-06 NOTE — Progress Notes (Signed)
CRITICAL VALUE ALERT  Critical value received:  Troponin = 3.29 Date of notification: 01/05/12 Time of notification: 2013 Critical value read back:yes  Nurse who received alert: O. Avrey Hyser MD notified (1st page): On call Fellow Time of first page:  2030 MD notified (2nd page):  Time of second page:  Responding MD:  Dr. Terressa Koyanagi Time MD responded: 2050

## 2012-01-06 NOTE — Evaluation (Signed)
Occupational Therapy Evaluation Patient Details Name: Judy Grant MRN: [id number] DOB: 1917-06-15 Today's Date: 01/06/2012 Time: 1308-6578 OT Time Calculation (min): 42 min  OT Assessment / Plan / Recommendation Clinical Impression  Pt is a 78 yr old female admitted with new bilateral CVAs and MI.  Currently presents with increased overall dependence with basic selfcare tasks and functional transfers at a mod assist level.  Will benefit from acute OT services to address these issues in order to increase independence.  Prior to admission pt was living with handicaped foster son and was getting 5X/wk help for 8 hours a day.  Now feel based on current situation that pt will likely need SNF level follow-up therapy secondary to not having 24 hour assist at home.    OT Assessment  Patient needs continued OT Services    Follow Up Recommendations  Skilled nursing facility    Barriers to Discharge Decreased caregiver support    Equipment Recommendations  3 in 1 bedside comode       Frequency  Min 2X/week    Precautions / Restrictions Precautions Precautions: Fall Precaution Comments: recent left foot fracture has cam boot for mobility, no weightbearing restrictions found Required Braces or Orthoses: Other Brace/Splint Other Brace/Splint: cam boot for left foot Restrictions Weight Bearing Restrictions: No   Pertinent Vitals/Pain HR 100 BPM but variable, sats 92% or better on room air throughout session    ADL  Eating/Feeding: Performed;Set up Where Assessed - Eating/Feeding: Bed level Grooming: Performed;Minimal assistance Where Assessed - Grooming: Unsupported sitting Upper Body Bathing: Simulated;Minimal assistance Where Assessed - Upper Body Bathing: Unsupported sitting Lower Body Bathing: Simulated;Moderate assistance Where Assessed - Lower Body Bathing: Supported sit to stand Upper Body Dressing: Simulated;Moderate assistance Where Assessed - Upper Body Dressing:  Unsupported sitting Lower Body Dressing: Simulated;Moderate assistance Where Assessed - Lower Body Dressing: Supported sit to Pharmacist, hospital: Performed;Moderate assistance Toilet Transfer Method: Stand pivot Toilet Transfer Equipment: Bedside commode Toileting - Clothing Manipulation and Hygiene: Performed;Moderate assistance Where Assessed - Toileting Clothing Manipulation and Hygiene: Sit to stand from 3-in-1 or toilet Tub/Shower Transfer Method: Not assessed Equipment Used: Other (comment) (cam walker boot) Transfers/Ambulation Related to ADLs: Pt currently requires mod assist for transfers stand pivot with cam boot on the left foot. ADL Comments: Pt with decreased coordination and strength in the right hand for all selfcare tasks.  Needs assistance for sit to stand at mod level and to maintain standing balance for toileting tasks.    OT Diagnosis: Generalized weakness;Hemiplegia dominant side  OT Problem List: Decreased strength;Decreased range of motion;Decreased coordination;Impaired balance (sitting and/or standing);Decreased knowledge of use of DME or AE;Impaired UE functional use OT Treatment Interventions: Self-care/ADL training;Therapeutic activities;Therapeutic exercise;Neuromuscular education;DME and/or AE instruction;Balance training;Patient/family education   OT Goals Acute Rehab OT Goals OT Goal Formulation: With patient Time For Goal Achievement: 01/20/12 Potential to Achieve Goals: Good ADL Goals Pt Will Perform Grooming: with supervision;Unsupported;Sitting, edge of bed ADL Goal: Grooming - Progress: Goal set today Pt Will Perform Upper Body Bathing: with supervision;Unsupported;Sitting, edge of bed ADL Goal: Upper Body Bathing - Progress: Goal set today Pt Will Perform Lower Body Bathing: with min assist;Sit to stand from bed ADL Goal: Lower Body Bathing - Progress: Goal set today Pt Will Perform Upper Body Dressing: with supervision;Unsupported;Sitting,  bed ADL Goal: Upper Body Dressing - Progress: Goal set today Pt Will Perform Lower Body Dressing: with min assist;Supported;Sit to stand from bed ADL Goal: Lower Body Dressing - Progress: Goal set today Pt Will  Transfer to Toilet: with min assist;Ambulation;with DME;3-in-1 ADL Goal: Toilet Transfer - Progress: Goal set today Miscellaneous OT Goals Miscellaneous OT Goal #1: Pt will perform LUE AROM/ FM coordination exercises with supervision to increase functional use. OT Goal: Miscellaneous Goal #1 - Progress: Goal set today  Visit Information  Last OT Received On: 01/06/12 Assistance Needed: +1    Subjective Data  Subjective: "I had a stroke and a heart attack or something." Patient Stated Goal: Get back to taking care of herself.   Prior Functioning     Home Living Lives With: Son;Other (Comment) (son is handicapped could only provide supervision.) Available Help at Discharge: Personal care attendant;Other (Comment) (5 days a week 8:00-4:00) Type of Home: Mobile home Home Access: Stairs to enter;Ramped entrance Home Layout: One level Bathroom Shower/Tub: Engineer, manufacturing systems: Handicapped height Bathroom Accessibility: Yes Home Adaptive Equipment: Walker - rolling;Wheelchair - powered Additional Comments: before her foot fracture used the walker most of the time in the house.  the walker is her son's who has CP, but he doesn't use it. Prior Function Level of Independence: Independent;Independent with assistive device(s) (until she broke her foot) Communication Communication: No difficulties Dominant Hand: Right         Vision/Perception Vision - Assessment Eye Alignment: Within Functional Limits Vision Assessment: Vision impaired - to be further tested in functional context Perception Perception: Within Functional Limits Praxis Praxis: Intact   Cognition  Overall Cognitive Status: Appears within functional limits for tasks  assessed/performed Arousal/Alertness: Awake/alert Orientation Level: Appears intact for tasks assessed Behavior During Session: [facility] for tasks performed    Extremity/Trunk Assessment Right Upper Extremity Assessment RUE ROM/Strength/Tone: Deficits RUE ROM/Strength/Tone Deficits: Pt with Brunnstrum stage V movement in the arm and hand, dysmetria and significant shoulder and arm weakness as well.  History of arthritic changes in the shoulder per x-ray. Pt with only 100 degrees PROM available in the shoulder.  Elbow flexion and extension 3-/5.   RUE Sensation: WFL - Light Touch RUE Coordination: Deficits RUE Coordination Deficits: Pt able to oppose thumb to all digits but decreased ability to hold onto toilet paper or donn sock. Trunk Assessment Trunk Assessment: Kyphotic     Mobility Bed Mobility Bed Mobility: Supine to Sit Supine to Sit: 4: Min assist Transfers Transfers: Sit to Stand Sit to Stand: 3: Mod assist;Without upper extremity assist;From bed;From toilet           Balance Balance Balance Assessed: Yes Dynamic Sitting Balance Dynamic Sitting - Balance Support: No upper extremity supported Dynamic Sitting - Level of Assistance: 4: Min assist Static Standing Balance Static Standing - Balance Support: Right upper extremity supported;Left upper extremity supported Static Standing - Level of Assistance: 4: Min assist   End of Session OT - End of Session Activity Tolerance: Patient tolerated treatment well Patient left: in bed;with call bell/phone within reach Nurse Communication: Mobility status     Carron Mcmurry OTR/L Pager number [phone] 01/06/2012, 12:19 PM

## 2012-01-06 NOTE — Progress Notes (Signed)
On call MD (Dr. Aldine ContesElisabeth Pigeon) notified re Pt's SBP post receiving metoprolol 5 mg iv for rate control of afib. HR remains 60 - 90's. New order to hold 0600 am dose. Will cont to monitor closely.

## 2012-01-06 NOTE — Progress Notes (Signed)
Judy Grant has undergone MRI evaluation the brain with MRA of the head tonight. The formal reading is not available, but by my reading, the patient has small bihemispheric embolic strokes. The MRA is relatively unremarkable. At this point, that cardiac ischemia is suspected, the patient can be cleared to go back on IV heparin. I will restart heparin as per pharmacy protocol. Lesly Dukes

## 2012-01-06 NOTE — Progress Notes (Signed)
Report was called to RN on unit 4N.  Pt will transfer to room 14 4N.  Pts daughter was called to inform of the new room number.

## 2012-01-06 NOTE — Progress Notes (Signed)
ANTICOAGULATION CONSULT NOTE - Follow Up Consult  Pharmacy Consult for heparin/Warfarin Indication: atrial fibrillation, stroke and ACS  No Known Allergies  Assessment: 78yo female had tx'd from [facility] for +troponin and in Afib, CT negative for bleed though initial read of MRI shows likely small bihemispheric embolic strokes, has h/o PAF.  Heparin resumed after stopping for neuro eval; will aim for conservative goal.  Warfarin therapy to start today for ongoing rapid atrial fibrillation per MD and increased risk for recurrent thromboembolism.  Will initiate Warfarin with small dose to see how this affects her protime and then titrate upward to a goal of 2.0-2.5.  Her CBC is stable as well as her platelets this morning and she has no s/s of bleeding.  Her heparin level is within the target range of 0.3-0.5 at 0.32 as well.  Medication Review: Current and admission medications reviewed for drug/drug interactions with Warfarin therapy.  Please consider the following: The combination of Alendronate and Warfarin:   Esophageal adverse experiences, such as esophagitis, esophageal ulcers and esophageal erosions, occasionally with bleeding and rarely followed by esophageal stricture or perforation, have been reported in patients receiving treatment with oral bisphosphonates including Alendronate sodium.  Adding Warfarin therapy to her regimen may place her at higher risk for GI bleed.  Goal of Therapy:  Heparin level 0.3-0.5 units/ml Monitor platelets by anticoagulation protocol: Yes INR 2.0-2.5 (will strive for lower goal range)   Plan:   Continue IV heparin rate at 750 units/hr  F/U daily CBC, heparin level  Begin Warfarin therapy with 2.5 mg today  F/U AM PT/INR  Provide teaching book and video for review.  Consider stopping Alendronate therapy at discharge if you feel the risk for GI bleed is high.  Patient Measurements: Height: 5\' 2"  (157.5 cm) Weight: 127 lb 6.8 oz (57.8 kg) IBW/kg  (Calculated) : 50.1  Heparin Dosing Weight: 60kg  Vital Signs: Temp: 97.7 F (36.5 C) (10/11 0800) Temp src: Oral (10/11 0800) BP: 153/82 mmHg (10/11 0800) Pulse Rate: 73  (10/11 0800)  Labs:  Basename 01/06/12 0832 01/06/12 0508 01/05/12 1732 01/05/12 1153  HGB -- 10.8* -- 10.8*  HCT -- 34.2* -- 33.6*  PLT -- 183 -- 203  APTT -- -- -- 30  LABPROT -- -- -- 12.9  INR -- -- -- 0.98  HEPARINUNFRC 0.32 -- -- --  CREATININE -- 1.21* -- 1.27*  CKTOTAL -- -- -- --  CKMB -- -- -- --  TROPONINI -- 2.56* 3.29* 3.06*   Estimated Creatinine Clearance: 23.5 ml/min (by C-G formula based on Cr of 1.21).  Medical History: Past Medical History  Diagnosis Date  . Hyperlipidemia   . Osteoporosis   . Hypertension     with severe left ventricular hypertrophy; normal ejection fraction in 04/2005  . Valvular heart disease     Mild to moderate aortic stenosis; moderate to severe mitral stenosis in 2007; mild MR  . Paroxysmal atrial fibrillation     Remote  . Diverticulosis     Pancolonic; h/o LGI bleeding and diverticulitis  . Degenerative joint disease     Knees  . Chronic kidney disease     Mild; creatinine of 1.19-1.28 in recent years  . Anemia, iron deficiency   . Popliteal cyst     Left  . Fracture of wrist 2008    left  . Gout    Medications:  Prescriptions prior to admission  Medication Sig Dispense Refill  . alendronate (FOSAMAX) 70 MG tablet Take with a  full glass of water on an empty stomach.  4 tablet  5  . allopurinol (ZYLOPRIM) 100 MG tablet Take 100 mg by mouth daily.      . Calcium Carb-Cholecalciferol (OYSTER SHELL CALCIUM + D) 500-400 MG-UNIT TABS Take 1 tablet by mouth 3 (three) times daily.       Marland Kitchen docusate sodium (COLACE) 50 MG capsule Take by mouth 2 (two) times daily.       . fluticasone (FLONASE) 50 MCG/ACT nasal spray Place 2 sprays into the nose daily.       . furosemide (LASIX) 40 MG tablet Take 1 tablet (40 mg total) by mouth daily.  30 tablet  5  .  HYDROcodone-acetaminophen (VICODIN) 5-500 MG per tablet Take 1 tablet by mouth 2 (two) times daily as needed. uncontrolled arthritic pain      . metoprolol (LOPRESSOR) 50 MG tablet Take 50-75 mg by mouth 2 (two) times daily. TAKE 1 AND 1/2 TABLETS (75mg ) BYMOUTH IN THE MORNING AND1 TABLET (50mg ) IN THE EVENING.      Marland Kitchen polyethylene glycol (MIRALAX / GLYCOLAX) packet Take 17 g by mouth daily.      . potassium chloride (KLOR-CON M10) 10 MEQ tablet Take 1 tablet (10 mEq total) by mouth as directed.  45 tablet  5  . simvastatin (ZOCOR) 20 MG tablet Take 20 mg by mouth at bedtime. FOR CHOLESTEROL.      Marland Kitchen traMADol (ULTRAM) 50 MG tablet Take 50 mg by mouth every 6 (six) hours as needed. Pain.      . valsartan (DIOVAN) 80 MG tablet Take 80 mg by mouth daily.        Nadara Mustard, PharmD., MS Clinical Pharmacist Pager:  [phone] Thank you for allowing pharmacy to be part of this patients care team. 01/06/2012,9:36 AM

## 2012-01-06 NOTE — Progress Notes (Signed)
[facility] Progress Note Judy Grant - Stepdown/ICU TEAM   Judy Grant WUJ:[id number] DOB: 20-Jun-1917 DOA: 01/05/2012 PCP: Judy Overman, MD  Brief narrative: 78 year old female patient who lives at home history of paroxysmal atrial fibrillation. Had been experiencing right-sided weakness on the day prior to admission. EMS initially came to the house but left after patient was found to be asymptomatic. When the home health nurse arrived on the morning of admit she found the patient aphasic and unable to move Grant right side. The patient was subsequently transported to the [facility] emergency department where CT of the head showed no acute abnormalities. In addition the patient was found to have EKG changes suspicious for ST elevation in troponin was elevated at 3.06 so she was transferred to [facility]. By the time she arrived to [facility] speech had returned. She also been started on a heparin drip at [facility] emergency department. Cardiology was consulted as well as neurology. She was felt to be out of the window for TPA. Because of concerns of evolving MI she was admitted to the ICU overnight. This morning when I assumed care of the patient I contacted the admitting physician who would not be rounding on this patient today. We reviewed the patient's chart and determine the patient was stable to transfer to step down level.  Assessment/Plan: Principal Problem:  *NSTEMI, initial episode of care *Appreciate cardiology assistance *Only subtle ST segment elevations anteriorly leads troponins are currently trending downward *Continue heparin,aspirin, statin drug and beta blockers  Active Problems:  CVA (cerebral infarction)/ Hemiplegia, unspecified, affecting dominant side *Hemiplegia has completely resolved on the right and no residual extremity tingling or numbness reported by the patient *MRI brain shows small areas of acute infarction in the parietal lobes  bilaterally and neurology suspects these are embolic in etiology *Was also found to have severe stenosis in the parietal branches of the left middle cerebral artery *Because of embolic infarct in setting of history of PAF patient will require long-term anticoagulation  Paroxysmal atrial fibrillation/intermittent atrial fibrillation with RVR *Still has occasional bursts of tachycardia with rates up to 130 but this in the setting of not having received metoprolol yet *Initiating chronic anticoagulation this admission    Hyperlipidemia *Continue statin therapy    Hypertension *Blood pressure has been controlled this admission  *Continue beta blocker *Was also on Lasix and Diovan prior to admission-consider resuming Diovan but suspect we'll need to keep systolic blood pressure at least 160 given acute CVA   CKD (chronic kidney disease) stage 3, GFR 30-59 ml/min *Creatinine stable on actually improved when compared to data from August 2013   ANEMIA-IRON DEFICIENCY *Hemoglobin stable and comparable to baseline of 10   Moderate aortic stenosis/Moderate mitral stenosis *Repeating echocardiogram this admission   Swelling of joint of right shoulder *Chronic issue   DVT prophylaxis: On full dose IV heparin with plans to transition to Judy Grant to treat embolic CVA/atrial fibrillation Code Status: Full Family Communication: Spoke with patient Disposition Plan: Transfer to neuro floor with telemetry-Judy Grant evaluation complete with recommendations for skilled nursing facility  Consultants: Cardiology Neurology  Procedures: None  Antibiotics: None  HPI/Subjective: Patient alert and endorses complete resolution of prior hemiplegia symptoms. Denies visual disturbances, shortness of breath or chest pain. States is essentially nonambulatory at home and had been unable to bear weight on foot since recent fracture.   Objective: Blood pressure 185/88, pulse 100, temperature 97.7 F (36.5 C),  temperature source Oral, resp. rate 22,  height 5\' 2"  (Grant.575 m), weight 57.8 kg (127 lb 6.8 oz), SpO2 96.00%.  Intake/Output Summary (Last 24 hours) at 01/06/12 1212 Last data filed at 01/06/12 1100  Gross per 24 hour  Intake  319.8 ml  Output   1276 ml  Net -956.2 ml     Exam: General: No acute respiratory distress Lungs: Clear to auscultation bilaterally without wheezes or crackles, room air with saturations 97% Cardiovascular: Regular rate and rhythm without murmur gallop or rub normal S1 and S2, IV heparin Abdomen: Nontender, nondistended, soft, bowel sounds positive, no rebound, no ascites, no appreciable mass Musculoskeletal: No significant cyanosis, clubbing of bilateral lower extremities Neurological: Patient is awake and oriented x3, moves all extremities x4 without any focal neurological deficits appreciated  Data Reviewed: Basic Metabolic Panel:  Lab 01/06/12 5284 01/05/12 1153  NA 138 137  K 4.5 4.2  CL 102 100  CO2 22 25  GLUCOSE 89 82  BUN 22 25*  CREATININE Grant.21* Grant.27*  CALCIUM 10.4 10.7*  MG -- --  PHOS -- --   Liver Function Tests:  Lab 01/05/12 1153  AST 21  ALT 8  ALKPHOS 49  BILITOT 0.6  PROT 7.4  ALBUMIN 3.9   No results found for this basename: LIPASE:5,AMYLASE:5 in the last 168 hours No results found for this basename: AMMONIA:5 in the last 168 hours CBC:  Lab 01/06/12 0508 01/05/12 1153  WBC 6.Grant 6.4  NEUTROABS -- 4.4  HGB 10.8* 10.8*  HCT 34.2* 33.6*  MCV 92.2 92.6  PLT 183 203   Cardiac Enzymes:  Lab 01/06/12 0508 01/05/12 1732 01/05/12 1153  CKTOTAL -- -- --  CKMB -- -- --  CKMBINDEX -- -- --  TROPONINI 2.56* 3.29* 3.06*   BNP (last 3 results) No results found for this basename: PROBNP:3 in the last 8760 hours CBG:  Lab 01/06/12 0557 01/05/12 2334 01/05/12 1933 01/05/12 1156  GLUCAP 96 85 79 73    Recent Results (from the past 240 hour(s))  MRSA PCR SCREENING     Status: Normal   Collection Time   01/05/12  6:40 PM       Component Value Range Status Comment   MRSA by PCR NEGATIVE  NEGATIVE Final      Studies:  Recent x-ray studies have been reviewed in detail by the Attending Physician  Scheduled Meds:  Reviewed in detail by the Attending Physician   Junious Silk, ANP [facility] Office  [phone] Pager [phone]  On-Call/Text Page:      [email or web address]      password [email or web address]  If 7PM-7AM, please contact night-coverage [email or web address] Password [email or web address] 01/06/2012, 12:12 PM   LOS: Grant day   I have examined the patient, reviewed the chart and agree with the above note.   Calvert Cantor, MD [phone]

## 2012-01-06 NOTE — Progress Notes (Signed)
ANTICOAGULATION CONSULT NOTE - Initial Consult  Pharmacy Consult for heparin Indication: atrial fibrillation, stroke and ACS  No Known Allergies  Patient Measurements: Height: 5\' 2"  (157.5 cm) Weight: 134 lb 4.2 oz (60.9 kg) IBW/kg (Calculated) : 50.1  Heparin Dosing Weight: 60kg  Vital Signs: Temp: 98.2 F (36.8 C) (10/11 0000) Temp src: Oral (10/11 0000) BP: 95/43 mmHg (10/10 2300) Pulse Rate: 70  (10/10 2300)  Labs:  Basename 01/05/12 1732 01/05/12 1153  HGB -- 10.8*  HCT -- 33.6*  PLT -- 203  APTT -- 30  LABPROT -- 12.9  INR -- 0.98  HEPARINUNFRC -- --  CREATININE -- 1.27*  CKTOTAL -- --  CKMB -- --  TROPONINI 3.29* 3.06*    Estimated Creatinine Clearance: 24.3 ml/min (by C-G formula based on Cr of 1.27).   Medical History: Past Medical History  Diagnosis Date  . Hyperlipidemia   . Osteoporosis   . Hypertension     with severe left ventricular hypertrophy; normal ejection fraction in 04/2005  . Valvular heart disease     Mild to moderate aortic stenosis; moderate to severe mitral stenosis in 2007; mild MR  . Paroxysmal atrial fibrillation     Remote  . Diverticulosis     Pancolonic; h/o LGI bleeding and diverticulitis  . Degenerative joint disease     Knees  . Chronic kidney disease     Mild; creatinine of 1.19-1.28 in recent years  . Anemia, iron deficiency   . Popliteal cyst     Left  . Fracture of wrist 2008    left  . Gout     Medications:  Prescriptions prior to admission  Medication Sig Dispense Refill  . alendronate (FOSAMAX) 70 MG tablet Take with a full glass of water on an empty stomach.  4 tablet  5  . allopurinol (ZYLOPRIM) 100 MG tablet Take 100 mg by mouth daily.      . Calcium Carb-Cholecalciferol (OYSTER SHELL CALCIUM + D) 500-400 MG-UNIT TABS Take 1 tablet by mouth 3 (three) times daily.       Marland Kitchen docusate sodium (COLACE) 50 MG capsule Take by mouth 2 (two) times daily.       . fluticasone (FLONASE) 50 MCG/ACT nasal spray Place  2 sprays into the nose daily.       . furosemide (LASIX) 40 MG tablet Take 1 tablet (40 mg total) by mouth daily.  30 tablet  5  . HYDROcodone-acetaminophen (VICODIN) 5-500 MG per tablet Take 1 tablet by mouth 2 (two) times daily as needed. uncontrolled arthritic pain      . metoprolol (LOPRESSOR) 50 MG tablet Take 50-75 mg by mouth 2 (two) times daily. TAKE 1 AND 1/2 TABLETS (75mg ) BYMOUTH IN THE MORNING AND1 TABLET (50mg ) IN THE EVENING.      Marland Kitchen polyethylene glycol (MIRALAX / GLYCOLAX) packet Take 17 g by mouth daily.      . potassium chloride (KLOR-CON M10) 10 MEQ tablet Take 1 tablet (10 mEq total) by mouth as directed.  45 tablet  5  . simvastatin (ZOCOR) 20 MG tablet Take 20 mg by mouth at bedtime. FOR CHOLESTEROL.      Marland Kitchen traMADol (ULTRAM) 50 MG tablet Take 50 mg by mouth every 6 (six) hours as needed. Pain.      . valsartan (DIOVAN) 80 MG tablet Take 80 mg by mouth daily.       Scheduled:    . aspirin  324 mg Oral NOW   Or  . aspirin  300 mg Rectal NOW  . aspirin EC  81 mg Oral Daily  . aspirin  325 mg Oral Once  . heparin  3,000 Units Intravenous Once  . metoprolol  5 mg Intravenous Q8H  . simvastatin  20 mg Oral q1800  . sodium chloride  3 mL Intravenous Q12H    Assessment: 78yo female had tx'd from [facility] for +troponin and in Afib, CT negative for bleed though initial read of MRI shows likely small bihemispheric embolic strokes, has h/o PAF though not on anticoag (unclear why), to resume heparin after stopped for neuro eval; will aim for conservative goal.  Goal of Therapy:  Heparin level 0.3-0.5 units/ml Monitor platelets by anticoagulation protocol: Yes   Plan:  Will resume heparin gtt at 750 units/hr and monitor heparin levels and CBC.  Colleen Can PharmD BCPS 01/06/2012,12:29 AM

## 2012-01-07 DIAGNOSIS — I4891 Unspecified atrial fibrillation: Secondary | ICD-10-CM

## 2012-01-07 LAB — HEPARIN LEVEL (UNFRACTIONATED): Heparin Unfractionated: 0.43 IU/mL (ref 0.30–0.70)

## 2012-01-07 LAB — CBC
Hemoglobin: 9.8 g/dL — ABNORMAL LOW (ref 12.0–15.0)
MCH: 29.4 pg (ref 26.0–34.0)
MCHC: 32.2 g/dL (ref 30.0–36.0)
Platelets: 182 10*3/uL (ref 150–400)
RDW: 12.5 % (ref 11.5–15.5)

## 2012-01-07 LAB — GLUCOSE, CAPILLARY: Glucose-Capillary: 82 mg/dL (ref 70–99)

## 2012-01-07 LAB — PROTIME-INR
INR: 1.09 (ref 0.00–1.49)
Prothrombin Time: 14 seconds (ref 11.6–15.2)

## 2012-01-07 MED ORDER — WARFARIN SODIUM 2.5 MG PO TABS
2.5000 mg | ORAL_TABLET | Freq: Once | ORAL | Status: AC
  Administered 2012-01-07: 2.5 mg via ORAL
  Filled 2012-01-07: qty 1

## 2012-01-07 NOTE — Progress Notes (Signed)
Stroke Team Progress Note  HISTORY  Judy Grant is an 78 y.o. female who was admitted today with right sided weakness and aphasia that started yesterday, unknown time of onset. She arrived in ER today around 1032. She has had neck pain earlier today but no chest pain. Her EKG shows minimally elevated inf-lat changes with intermittent a-flutter. History of aortic stenosis.  Currently she is not aphasic. She does complain of right sided weakness and right visual changes. She has been started on heparin earlier in the day with no neurological worsening. Head CT negative for acute stroke or hemorrhage.   LSN: 01/04/2012  tPA Given: No: out of window  SUBJECTIVE  Patient lying in bed. Following commands. No family in room.  OBJECTIVE Most recent Vital Signs: Filed Vitals:   01/07/12 0201 01/07/12 0500 01/07/12 0603 01/07/12 1005  BP: 114/64  124/67 132/71  Pulse: 86  81 80  Temp: 98.8 F (37.1 C)  98.4 F (36.9 C) 98.5 F (36.9 C)  TempSrc: Oral  Oral Oral  Resp: 18  18 18   Height:      Weight:  57.7 kg (127 lb 3.3 oz)    SpO2: 96%  100% 100%   CBG (last 3)   Basename 01/06/12 1600 01/06/12 0557 01/05/12 2334  GLUCAP 101* 96 85    IV Fluid Intake:      . DISCONTD: heparin 750 Units/hr (01/07/12 0146)    MEDICATIONS     . aspirin  324 mg Oral NOW  . aspirin EC  81 mg Oral Daily  . coumadin book   Does not apply Once  . metoprolol tartrate  25 mg Oral BID  . simvastatin  20 mg Oral q1800  . sodium chloride  3 mL Intravenous Q12H  . warfarin  2.5 mg Oral ONCE-1800  . warfarin   Does not apply Once  . Warfarin - Pharmacist Dosing Inpatient   Does not apply q1800   PRN:  sodium chloride, acetaminophen, morphine injection, nitroGLYCERIN, ondansetron (ZOFRAN) IV, sodium chloride  Diet:   Heart healthy, thin Activity:  Out of bed with assistance DVT Prophylaxis:  Heparin drip  CLINICALLY SIGNIFICANT STUDIES Basic Metabolic Panel:   Lab 01/06/12 0508 01/05/12  1153  NA 138 137  K 4.5 4.2  CL 102 100  CO2 22 25  GLUCOSE 89 82  BUN 22 25*  CREATININE 1.21* 1.27*  CALCIUM 10.4 10.7*  MG -- --  PHOS -- --   Liver Function Tests:   Lab 01/05/12 1153  AST 21  ALT 8  ALKPHOS 49  BILITOT 0.6  PROT 7.4  ALBUMIN 3.9   CBC:   Lab 01/07/12 0700 01/06/12 0508 01/05/12 1153  WBC 6.0 6.1 --  NEUTROABS -- -- 4.4  HGB 9.8* 10.8* --  HCT 30.4* 34.2* --  MCV 91.3 92.2 --  PLT 182 183 --   Coagulation:   Lab 01/07/12 0700 01/05/12 1153  LABPROT 14.0 12.9  INR 1.09 0.98   Cardiac Enzymes:   Lab 01/06/12 0508 01/05/12 1732 01/05/12 1153  CKTOTAL -- -- --  CKMB -- -- --  CKMBINDEX -- -- --  TROPONINI 2.56* 3.29* 3.06*   Urinalysis:   Lab 01/05/12 1257  COLORURINE YELLOW  LABSPEC 1.010  PHURINE 7.0  GLUCOSEU NEGATIVE  HGBUR NEGATIVE  BILIRUBINUR NEGATIVE  KETONESUR NEGATIVE  PROTEINUR NEGATIVE  UROBILINOGEN 0.2  NITRITE NEGATIVE  LEUKOCYTESUR NEGATIVE   Lipid Panel    Component Value Date/Time   CHOL 138 01/06/2012  0508   TRIG 57 01/06/2012 0508   HDL 67 01/06/2012 0508   CHOLHDL 2.1 01/06/2012 0508   VLDL 11 01/06/2012 0508   LDLCALC 60 01/06/2012 0508   HgbA1C  Lab Results  Component Value Date   HGBA1C 5.9* 01/06/2012    Urine Drug Screen:     Component Value Date/Time   LABOPIA NONE DETECTED 01/05/2012 1258   COCAINSCRNUR NONE DETECTED 01/05/2012 1258   LABBENZ NONE DETECTED 01/05/2012 1258   AMPHETMU NONE DETECTED 01/05/2012 1258   THCU NONE DETECTED 01/05/2012 1258   LABBARB NONE DETECTED 01/05/2012 1258      Ct Head Wo Contrast 01/05/2012  No intracranial hemorrhage.  Small vessel disease type changes without CT evidence of large acute infarct.     Dg Shoulder Right Port 01/05/2012   1.  No acute findings. 2.  Chronic deformity and remodeling of the right humeral head with mild anterior subluxation.  This may be due to old fracture or avascular necrosis. 3.  Mild acromioclavicular degenerative  spurring. 4.  Osteopenia.   Original Report Authenticated By: Danae Orleans, M.D.     MRI of the brain Small areas of acute infarction parietal lobe bilaterally. Probable artifact rather than acute infarct in the left pons.    MRA of the brain  Severe stenosis in the parietal branch of the left middle cerebral artery. This may account for the acute infarct on the left   2D Echocardiogram  Systolic function was normal. The estimated ejection fraction was in the range of 60% to 65%. - Aortic valve: Severely calcified annulus. Severely calcified leaflets. There was moderate stenosis. Mild regurgitation. Valve area: 1cm^2(VTI). Valve area: 1.13cm^2 (Vmax).    Carotid Doppler  No significant stenosis  CXR  Cardiomegaly and small left pleural effusion  EKG  normal sinus rhythm with intermittent aflutter with minimal st elevation inferior lateral leads, elevated trop with patient asymptomatic  Therapy Recommendations PT - ; OT - ; ST -   Physical Exam   Frail elderly African American lady not in distress.Awake alert. Afebrile. Head is nontraumatic. Neck is supple without bruit. Hearing is normal. Cardiac exam no murmur or gallop. Lungs are clear to auscultation. Distal pulses are well felt.  Neurological Exam : awake alert disoriented. Diminished attention, registration, short-term memory or recall. Speech is extremely garbled and barely understood. Unable to tell me her name or follow simple commands. She can follow gaze in all directions. She blinks to threat bilaterally. She's not very cooperative for detailed exam. Is no facial symmetry. Tongue is midline. She's able to move all 4 extremities against gravity but does not cooperative for testing drift however detailed muscle strength. Deep tendon for syncopal symmetric. She withdraws all her extremities to touch and pin. Plantars are both withdraws. Sensation, coordination and gait cannot be tested. ASSESSMENT Ms. Judy Grant is a  78 y.o. female presenting with right hemiparesis. Imaging confirms a acute infarction parietal lobe bilaterally with severe stenosis in the parietal branch of the left middle cerebral artery infarct. Infarct felt to be embolic secondary to atrial flutter.  Work up underway. On no anticoagulants prior to admission. Now on aspirin 81 mg orally every day for secondary stroke prevention. Patient with resultant right hemiparesis.   Imaging confirms a acute infarction parietal lobe bilaterally with severe stenosis in the parietal branch of the left middle cerebral artery infarct---on heparin, baby aspirin  Hyperlipidemia  Hypertension  Moderated aortic stenosis  Atrial flutter  NSTEMI, elevated troponin, placed  on heparin  Hospital day # 2  TREATMENT/PLAN  Continue coumadin and agree with DC Heparin.  Risk factor modification   FULL CODE STATUS  PT/OT therapies Stroke team will sign off. Call for questions.  Delia Heady, MD Medical Director [facility] Pager: [phone] 01/07/2012 11:35 AM   01/07/2012 11:17 AM

## 2012-01-07 NOTE — Progress Notes (Signed)
Monitor tech reported another pause of 2.05 seconds, patient asleep in chair, no complaints. MD Lavera Guise paged, orders received to DC metoprolol.  Minor, Yvette Rack

## 2012-01-07 NOTE — Progress Notes (Signed)
[facility] Progress Note [place] TEAM 1 - Stepdown/ICU TEAM   Judy Grant ZOX:[id number] DOB: [birth year] DOA: 01/05/2012 PCP: [name], MD  Brief narrative: 78 year old female patient who lives at home and has history of paroxysmal atrial fibrillation. She had been experiencing right-sided weakness on the day prior to admission. EMS initially came to the house but left after patient was found to be asymptomatic. When the home health nurse arrived on the morning of admit she found the patient aphasic and unable to move her right side. The patient was subsequently transported to the [facility] emergency department where a CT of the head showed no acute abnormalities. In addition the patient was found to have EKG changes suspicious for ST elevation and troponin was elevated at 3.06 so she was transferred to [facility]. By the time she arrived to [facility] her speech had returned. She also has been started on a heparin drip at [facility] emergency department. Cardiology was consulted as well as neurology. She was felt to be out of the window for TPA. Because of concerns of evolving MI she was admitted to the ICU overnight on 10/9. Patient was seen by neurology and cardiology when she arrived to [facility] and she was not deemed a candidate for cardiac catheterization or neuro interventional procedures.   Assessment/Plan: Principal Problem:  *NSTEMI, initial episode of care Patient remained fairly asymptomatic , without any chest pain or malignant ventricular arrhythmias. She was not found to be a candidate for cardiac catheterization.  She only had very subtle ST segment elevations anteriorly leads and troponins started trending downward. She was treated with iv Heparin for 48 hours. Aspirin, statin and beta blockers were started on admission too.    CVA (cerebral infarction)/ Hemiplegia, unspecified, affecting dominant side *Patient presented with right hemiparesis  and aphasia .   Hemiplegia has improved and aphasia has resolved *MRI brain shows small areas of acute infarction in the parietal lobes bilaterally and neurology suspects these are embolic in etiology *Was also found to have severe stenosis in the parietal branches of the left middle cerebral artery *Because of embolic infarct in setting of history of PAF patient will require long-term anticoagulation with coumadin  Paroxysmal atrial fibrillation/intermittent atrial fibrillation with RVR Patient to be on lifelong coumadin after this admission    Hyperlipidemia *Continue statin therapy    Hypertension *Blood pressure has been controlled this admission  *Continue beta blocker Allowing some permissive HTN in the setting of acute stroke.     CKD (chronic kidney disease) stage 3, GFR 30-59 ml/min *Creatinine stable on actually improved when compared to data from August 2013   ANEMIA-IRON DEFICIENCY *Hemoglobin stable and comparable to baseline of 10   Moderate aortic stenosis/Moderate mitral stenosis *Repeating echocardiogram this admission did not show any significant progression    Swelling of joint of right shoulder *Chronic issue   DVT prophylaxis: On full dose coumadin  Code Status: Full Family Communication: sister Disposition Plan: SNF  Consultants: Cardiology Neurology  Procedures: MRI of the brain Small areas of acute infarction parietal lobe bilaterally. Probable artifact rather than acute infarct in the left pons.  MRA of the brain Severe stenosis in the parietal branch of the left middle cerebral artery. This may account for the acute infarct on the left  2D Echocardiogram Systolic function was normal. The estimated ejection fraction was in the range of 60% to 65%. - Aortic valve: Severely calcified annulus. Severely calcified leaflets. There was moderate stenosis.  Mild regurgitation. Valve area: 1cm^2(VTI). Valve area: 1.13cm^2 (Vmax).  Carotid Doppler No  significant stenosis  CXR Cardiomegaly and small left pleural effusion    HPI/Subjective: Patient alert and endorses complete resolution of prior hemiplegia symptoms.   Objective: Blood pressure 152/96, pulse 79, temperature 97.6 F (36.4 C), temperature source Oral, resp. rate 18, height 5\' 2"  (1.575 m), weight 57.7 kg (127 lb 3.3 oz), SpO2 99.00%.  Intake/Output Summary (Last 24 hours) at 01/07/12 1304 Last data filed at 01/07/12 1053  Gross per 24 hour  Intake    840 ml  Output    350 ml  Net    490 ml     Exam: General: No acute respiratory distress Lungs: Clear to auscultation bilaterally without wheezes or crackles, room air with saturations 97% Cardiovascular: Regular rate and rhythm without murmur gallop or rub normal S1 and S2, IV heparin Abdomen: Nontender, nondistended, soft, bowel sounds positive, no rebound, no ascites, no appreciable mass Musculoskeletal: No significant cyanosis, clubbing of bilateral lower extremities Neurological: Patient is awake and oriented x3, moves all extremities x4 without any focal neurological deficits appreciated  Data Reviewed: Basic Metabolic Panel:  Lab 01/06/12 1610 01/05/12 1153  NA 138 137  K 4.5 4.2  CL 102 100  CO2 22 25  GLUCOSE 89 82  BUN 22 25*  CREATININE 1.21* 1.27*  CALCIUM 10.4 10.7*  MG -- --  PHOS -- --   Liver Function Tests:  Lab 01/05/12 1153  AST 21  ALT 8  ALKPHOS 49  BILITOT 0.6  PROT 7.4  ALBUMIN 3.9   No results found for this basename: LIPASE:5,AMYLASE:5 in the last 168 hours No results found for this basename: AMMONIA:5 in the last 168 hours CBC:  Lab 01/07/12 0700 01/06/12 0508 01/05/12 1153  WBC 6.0 6.1 6.4  NEUTROABS -- -- 4.4  HGB 9.8* 10.8* 10.8*  HCT 30.4* 34.2* 33.6*  MCV 91.3 92.2 92.6  PLT 182 183 203   Cardiac Enzymes:  Lab 01/06/12 0508 01/05/12 1732 01/05/12 1153  CKTOTAL -- -- --  CKMB -- -- --  CKMBINDEX -- -- --  TROPONINI 2.56* 3.29* 3.06*   BNP (last 3  results) No results found for this basename: PROBNP:3 in the last 8760 hours CBG:  Lab 01/07/12 1142 01/06/12 1600 01/06/12 0557 01/05/12 2334 01/05/12 1933  GLUCAP 82 101* 96 85 79    Recent Results (from the past 240 hour(s))  MRSA PCR SCREENING     Status: Normal   Collection Time   01/05/12  6:40 PM      Component Value Range Status Comment   MRSA by PCR NEGATIVE  NEGATIVE Final      Studies:  Recent x-ray studies have been reviewed in detail by the Attending Physician  Scheduled Meds:     . aspirin  324 mg Oral NOW  . aspirin EC  81 mg Oral Daily  . coumadin book   Does not apply Once  . metoprolol tartrate  25 mg Oral BID  . simvastatin  20 mg Oral q1800  . sodium chloride  3 mL Intravenous Q12H  . warfarin  2.5 mg Oral ONCE-1800  . warfarin   Does not apply Once  . Warfarin - Pharmacist Dosing Inpatient   Does not apply q1800      Emry Tobin [phone]  If 7PM-7AM, please contact night-coverage [email or web address] Password [email or web address] 01/07/2012, 1:04 PM   LOS: 2 days

## 2012-01-07 NOTE — Progress Notes (Signed)
Monitor tech reported a 2 second pause on the monitor strip at 2:40 pm. Patient checked, asleep in chair, no complaints. MD Lavera Guise paged with this information.  Judy Grant, Judy Grant

## 2012-01-07 NOTE — Progress Notes (Signed)
Patient ID: Terri Skains, female   DOB: 03-04-1918, 78 y.o.   MRN: [id number]    Patient Name: Judy Grant      SUBJECTIVE: admitted with stuttering stroke R weakness and aphasia with MRI>> B/L strokes  Back on Hep Hx of presumed rheumatic heart disease w mild AS/MS found on arrival to be in AF fast now slowerTn 3 or so and trending down;( no previous Tn in EPIC)   Repeat echo pending but prev EF 75%.  With severe LVH   Left foot pain today no other issues  Past Medical History  Diagnosis Date  . Hyperlipidemia   . Osteoporosis   . Hypertension     with severe left ventricular hypertrophy; normal ejection fraction in 04/2005  . Valvular heart disease     Mild to moderate aortic stenosis; moderate to severe mitral stenosis in 2007; mild MR  . Paroxysmal atrial fibrillation     Remote  . Diverticulosis     Pancolonic; h/o LGI bleeding and diverticulitis  . Degenerative joint disease     Knees  . Chronic kidney disease     Mild; creatinine of 1.19-1.28 in recent years  . Anemia, iron deficiency   . Popliteal cyst     Left  . Fracture of wrist 2008    left  . Gout     PHYSICAL EXAM Filed Vitals:   01/06/12 2203 01/07/12 0201 01/07/12 0500 01/07/12 0603  BP: 152/64 114/64  124/67  Pulse: 91 86  81  Temp: 98.7 F (37.1 C) 98.8 F (37.1 C)  98.4 F (36.9 C)  TempSrc: Oral Oral  Oral  Resp: 20 18  18   Height:      Weight:   127 lb 3.3 oz (57.7 kg)   SpO2: 97% 96%  100%    Well developed and nourished in no acute distress HENT normal Neck supple with JVP-flat Carotids brisk and full without bruits Clear Irregularly irregular rate and rhythm with controlled ventricular response, harsh 3/6 systolic m Abd-soft with active BS without hepatomegaly No Clubbing cyanosis edema Skin-warm and dry A & Oriented  Grossly normal sensory and motor function--moving all 4 extremeites    TELEMETRY: Reviewed telemetry pt in  Af Controlled ventricular  response:    Intake/Output Summary (Last 24 hours) at 01/07/12 0811 Last data filed at 01/06/12 1900  Gross per 24 hour  Intake  532.5 ml  Output    401 ml  Net  131.5 ml    LABS: Basic Metabolic Panel:  Lab 01/06/12 0865 01/05/12 1153  NA 138 137  K 4.5 4.2  CL 102 100  CO2 22 25  GLUCOSE 89 82  BUN 22 25*  CREATININE 1.21* 1.27*  CALCIUM 10.4 10.7*  MG -- --  PHOS -- --   Cardiac Enzymes:  Basename 01/06/12 0508 01/05/12 1732 01/05/12 1153  CKTOTAL -- -- --  CKMB -- -- --  CKMBINDEX -- -- --  TROPONINI 2.56* 3.29* 3.06*   CBC:  Lab 01/07/12 0700 01/06/12 0508 01/05/12 1153  WBC 6.0 6.1 6.4  NEUTROABS -- -- 4.4  HGB 9.8* 10.8* 10.8*  HCT 30.4* 34.2* 33.6*  MCV 91.3 92.2 92.6  PLT 182 183 203   PROTIME:  Basename 01/05/12 1153  LABPROT 12.9  INR 0.98   Liver Function Tests:  Basename 01/05/12 1153  AST 21  ALT 8  ALKPHOS 49  BILITOT 0.6  PROT 7.4  ALBUMIN 3.9  Hemoglobin A1C:  Basename 01/06/12 0508  HGBA1C 5.9*   Fasting Lipid Panel:  Basename 01/06/12 0508  CHOL 138  HDL 67  LDLCALC 60  TRIG 57  CHOLHDL 2.1  LDLDIRECT --      ASSESSMENT AND PLAN:  Patient Active Hospital Problem List: NSTEMI, initial episode of care (01/05/2012)    yperlipidemia ()   Hypertension ()  * CVA (cerebral infarction) (01/05/2012)  * Mod Arotic stenosis   Moderate mitral stenosis (01/05/2012)   New onset a-fib (01/05/2012)   CKD (chronic kidney disease) stage 3, GFR 30-59 ml/min (01/05/2012)   Swelling of joint of right shoulder (01/05/2012)    Plan:  Increase beta blocker for afib to maximize diastolic filling period.  See Dr Koren Bound note regarding anticaogulation.  Primary issue is rehab and left foot pain that keeps her from being ambulatory Will sign off  Signed, Charlton Haws MD  01/07/2012

## 2012-01-07 NOTE — Progress Notes (Signed)
Heparin drip stopped at 0830 this am per MD order  Minor, Yvette Rack

## 2012-01-07 NOTE — Progress Notes (Signed)
ANTICOAGULATION CONSULT NOTE - Follow Up Consult  Pharmacy Consult for coumadin Indication:  CVA, afib  No Known Allergies  Patient Measurements: Height: 5\' 2"  (157.5 cm) Weight: 127 lb 3.3 oz (57.7 kg) IBW/kg (Calculated) : 50.1   Vital Signs: Temp: 98.7 F (37.1 C) (10/12 1403) Temp src: Oral (10/12 1403) BP: 157/75 mmHg (10/12 1403) Pulse Rate: 79  (10/12 1253)  Labs:  Basename 01/07/12 0700 01/06/12 0832 01/06/12 0508 01/05/12 1732 01/05/12 1153  HGB 9.8* -- 10.8* -- --  HCT 30.4* -- 34.2* -- 33.6*  PLT 182 -- 183 -- 203  APTT -- -- -- -- 30  LABPROT 14.0 -- -- -- 12.9  INR 1.09 -- -- -- 0.98  HEPARINUNFRC 0.43 0.32 -- -- --  CREATININE -- -- 1.21* -- 1.27*  CKTOTAL -- -- -- -- --  CKMB -- -- -- -- --  TROPONINI -- -- 2.56* 3.29* 3.06*    Estimated Creatinine Clearance: 23.5 ml/min (by C-G formula based on Cr of 1.21).   Medications:  Scheduled:    . aspirin  324 mg Oral NOW  . aspirin EC  81 mg Oral Daily  . coumadin book   Does not apply Once  . metoprolol tartrate  25 mg Oral BID  . simvastatin  20 mg Oral q1800  . sodium chloride  3 mL Intravenous Q12H  . warfarin  2.5 mg Oral ONCE-1800  . warfarin   Does not apply Once  . Warfarin - Pharmacist Dosing Inpatient   Does not apply q1800    Assessment: 78 yo female with afib/CVA and valvular disease on coumadin day 2 and INR=1.09.  Heparin has been discontinued per MD.  Goal of Therapy:  Goal INR 2-2.5 Monitor platelets by anticoagulation protocol: Yes   Plan:  -Continune coumadin 2.5mg  today -Daily PT/INR  Harland German, Pharm D 01/07/2012 2:54 PM

## 2012-01-07 NOTE — Progress Notes (Signed)
Physical Therapy Treatment Patient Details Name: Judy Grant MRN: [id number] DOB: 04-Sep-1917 Today's Date: 01/07/2012 Time: 1324-4010 PT Time Calculation (min): 36 min  PT Assessment / Plan / Recommendation Comments on Treatment Session  Judy Grant is making good progress. Agree with SNF for rehab.     Follow Up Recommendations  Post acute inpatient     Does the patient have the potential to tolerate intense rehabilitation  No, Recommend SNF  Barriers to Discharge        Equipment Recommendations  None recommended by PT    Recommendations for Other Services    Frequency Min 4X/week   Plan Discharge plan remains appropriate;Frequency remains appropriate    Precautions / Restrictions Precautions Precautions: Fall Required Braces or Orthoses: Other Brace/Splint Other Brace/Splint: cam boot left foot Restrictions Weight Bearing Restrictions: No Other Position/Activity Restrictions: no weight bearing restrictions per ED note, pt reports she was supposed to go back to the bone doctor the day before she came to the hospital       Mobility  Bed Mobility Supine to Sit: 4: Min assist;HOB elevated (40 degrees) Sitting - Scoot to Edge of Bed: 4: Min guard;With rail Details for Bed Mobility Assistance: verbal cues for technique with minA for ease of movement Transfers Transfers: Sit to Stand;Stand to Sit (3 trials) Sit to Stand: From bed;From toilet;With upper extremity assist;From elevated surface;4: Min assist;3: Mod assist;4: Min guard (from bed x2) Stand to Sit: With upper extremity assist;To chair/3-in-1;To toilet Details for Transfer Assistance: sequencing cues for all transfers specifically hand placement and body positionin, from elevated bed pt requiring minA facilitation to bring trunk over BOS and power up for trunk/lower extremitiy extension; from lower toilet pt needing heavier modA to bring weight over her feet and extend into  standing Ambulation/Gait Ambulation/Gait Assistance: 4: Min assist Ambulation Distance (Feet): 30 Feet Assistive device: Rolling walker Ambulation/Gait Assistance Details: cues for safe maneuvering with RW, tall posture and to negotiate bulky cam bood especially with turns; in prep to sit pt needing more cueing for safety with RW as she was trying to reach for the grab bars by the toilet Gait Pattern: Trunk flexed;Antalgic;Step-to pattern;Narrow base of support General Gait Details: genu valgus noted on right, pt also needing really plantar flex on the right in order to advance over her left limb in stance given the increased height of the cam boot    Exercises General Exercises - Lower Extremity Long Arc Quad: AROM;Right;10 reps;Seated Hip ABduction/ADduction: AROM;Right;10 reps;Left;Seated Hip Flexion/Marching: AROM;Right;10 reps;Seated Toe Raises: AROM;Right;Seated Heel Raises: AROM;Right;Seated     PT Goals Acute Rehab PT Goals PT Goal: Supine/Side to Sit - Progress: Progressing toward goal PT Goal: Sit to Stand - Progress: Progressing toward goal PT Goal: Stand to Sit - Progress: Progressing toward goal PT Transfer Goal: Bed to Chair/Chair to Bed - Progress: Progressing toward goal PT Goal: Ambulate - Progress: Progressing toward goal  Visit Information  Last PT Received On: 01/07/12 Assistance Needed: +1    Subjective Data  Subjective: "Well sometimes I get a twinge in my neck" (pt pointing to behind her left ear)  Patient Stated Goal: to be independent   Cognition  Overall Cognitive Status: Appears within functional limits for tasks assessed/performed Arousal/Alertness: Awake/alert Orientation Level: Appears intact for tasks assessed Behavior During Session: [facility] for tasks performed Cognition - Other Comments: slightly impulsive needing corrective cues for hand placement with transfers    Balance     End of Session PT - End of  Session Equipment Utilized During  Treatment: Gait belt;Other (comment) (cam boot) Activity Tolerance: Patient tolerated treatment well Patient left: in chair;with call bell/phone within reach;with nursing in room Nurse Communication: Mobility status;Patient requests pain meds   GP     [facility] HELEN 01/07/2012, 1:14 PM

## 2012-01-07 NOTE — Progress Notes (Signed)
Pt c/o left side neck pain to Physical therapist  prior to getting out of bed.  RN came to reassess pt ,but pt stated that she has no pain at this moment  but would go ahead &  take some pain medicine before it hurts  . Denied any chest pain,. diizziness , Light headedness or sob.  Ambulated to the bathroom to void, assist by the PT and back to bed sitting at the side of bed .  Vs taken BP slightly high and tylenol 650 mg Po given .  Pt assisted  into a chair in the room. Will continue to monitor.

## 2012-01-08 DIAGNOSIS — I214 Non-ST elevation (NSTEMI) myocardial infarction: Secondary | ICD-10-CM

## 2012-01-08 LAB — CBC
HCT: 30.3 % — ABNORMAL LOW (ref 36.0–46.0)
MCHC: 31.4 g/dL (ref 30.0–36.0)
Platelets: 178 10*3/uL (ref 150–400)
RDW: 12.7 % (ref 11.5–15.5)
WBC: 5.1 10*3/uL (ref 4.0–10.5)

## 2012-01-08 LAB — BASIC METABOLIC PANEL
BUN: 19 mg/dL (ref 6–23)
Calcium: 9.7 mg/dL (ref 8.4–10.5)
Creatinine, Ser: 1.32 mg/dL — ABNORMAL HIGH (ref 0.50–1.10)
GFR calc Af Amer: 39 mL/min — ABNORMAL LOW (ref >90)
GFR calc non Af Amer: 34 mL/min — ABNORMAL LOW (ref >90)

## 2012-01-08 LAB — PROTIME-INR
INR: 1.07 (ref 0.00–1.49)
Prothrombin Time: 13.8 seconds (ref 11.6–15.2)

## 2012-01-08 MED ORDER — WARFARIN SODIUM 5 MG PO TABS
5.0000 mg | ORAL_TABLET | Freq: Once | ORAL | Status: AC
  Administered 2012-01-08: 5 mg via ORAL
  Filled 2012-01-08: qty 1

## 2012-01-08 MED ORDER — METOPROLOL TARTRATE 50 MG PO TABS
50.0000 mg | ORAL_TABLET | Freq: Two times a day (BID) | ORAL | Status: DC
  Administered 2012-01-08 – 2012-01-10 (×5): 50 mg via ORAL
  Filled 2012-01-08 (×6): qty 1

## 2012-01-08 NOTE — Progress Notes (Signed)
[facility] Progress Note [place] TEAM 1 - Stepdown/ICU TEAM   Judy Grant OZH:[id number] DOB: [birth year] DOA: 01/05/2012 PCP: [name], MD  Brief narrative: 78 year old female patient who lives at home and has history of paroxysmal atrial fibrillation. She had been experiencing right-sided weakness on the day prior to admission. EMS initially came to the house but left after patient was found to be asymptomatic. When the home health nurse arrived on the morning of admit she found the patient aphasic and unable to move her right side. The patient was subsequently transported to the [facility] emergency department where a CT of the head showed no acute abnormalities. In addition the patient was found to have EKG changes suspicious for ST elevation and troponin was elevated at 3.06 so she was transferred to [facility]. By the time she arrived to [facility] her speech had returned. She also has been started on a heparin drip at [facility] emergency department. Cardiology was consulted as well as neurology. She was felt to be out of the window for TPA. Because of concerns of evolving MI she was admitted to the ICU overnight on 10/9. Patient was seen by neurology and cardiology when she arrived to [facility] and she was not deemed a candidate for cardiac catheterization or neuro interventional procedures.   Assessment/Plan: Principal Problem:  *NSTEMI, initial episode of care Patient remained fairly asymptomatic , without any chest pain or malignant ventricular arrhythmias. She was not found to be a candidate for cardiac catheterization.  She only had very subtle ST segment elevations anteriorly leads and troponins started trending downward. She was treated with iv Heparin for 48 hours. Aspirin, statin and beta blockers were started on admission too.    CVA (cerebral infarction)/ Hemiplegia, unspecified, affecting dominant side *Patient presented with right hemiparesis  and aphasia .   Hemiplegia has improved and aphasia has resolved *MRI brain shows small areas of acute infarction in the parietal lobes bilaterally and neurology suspects these are embolic in etiology *Was also found to have severe stenosis in the parietal branches of the left middle cerebral artery *Because of embolic infarct in setting of history of PAF patient will require long-term anticoagulation with coumadin  Paroxysmal atrial fibrillation/intermittent atrial fibrillation with RVR Patient to be on lifelong coumadin after this admission  Patient on BB prior to admission. On 10/12 she had some pauses so BB stopped - by 10/13 she was in aflutter/fib with very high ventricular rate. Metoprolol 50 BID resumed 10/13.    Hyperlipidemia *Continue statin therapy    Hypertension *Blood pressure has been controlled this admission  *Continue beta blocker Allowing some permissive HTN in the setting of acute stroke.     CKD (chronic kidney disease) stage 3, GFR 30-59 ml/min *Creatinine stable on actually improved when compared to data from August 2013   ANEMIA-IRON DEFICIENCY *Hemoglobin stable and comparable to baseline of 10   Moderate aortic stenosis/Moderate mitral stenosis *Repeating echocardiogram this admission did not show any significant progression    Swelling of joint of right shoulder *Chronic issue   DVT prophylaxis: On full dose coumadin  Code Status: Full Family Communication: sister was at bedisde 10/12 - main communication person daughter Marli, [place] Daughter [phone]  Disposition Plan: SNF  Consultants: Cardiology Neurology  Procedures: MRI of the brain Small areas of acute infarction parietal lobe bilaterally. Probable artifact rather than acute infarct in the left pons.  MRA of the brain Severe stenosis in the parietal branch  of the left middle cerebral artery. This may account for the acute infarct on the left  2D Echocardiogram  Systolic function was normal. The estimated ejection fraction was in the range of 60% to 65%. - Aortic valve: Severely calcified annulus. Severely calcified leaflets. There was moderate stenosis. Mild regurgitation. Valve area: 1cm^2(VTI). Valve area: 1.13cm^2 (Vmax).  Carotid Doppler No significant stenosis  CXR Cardiomegaly and small left pleural effusion    HPI/Subjective: Up in the chair today   Objective: Blood pressure 137/64, pulse 87, temperature 98.1 F (36.7 C), temperature source Oral, resp. rate 18, height 5\' 2"  (1.575 m), weight 58.2 kg (128 lb 4.9 oz), SpO2 100.00%.  Intake/Output Summary (Last 24 hours) at 01/08/12 0917 Last data filed at 01/07/12 1300  Gross per 24 hour  Intake    360 ml  Output    350 ml  Net     10 ml     Exam: General: No acute respiratory distress Lungs: Clear to auscultation bilaterally without wheezes or crackles, room air with saturations 97% Cardiovascular: Regular rate and rhythm without murmur gallop or rub normal S1 and S2, IV heparin Abdomen: Nontender, nondistended, soft, bowel sounds positive, no rebound, no ascites, no appreciable mass Neurological: Patient is awake and oriented x3, has 3/5 right hemiparesis , no aphasia   Data Reviewed: Basic Metabolic Panel:  Lab 01/06/12 1610 01/05/12 1153  NA 138 137  K 4.5 4.2  CL 102 100  CO2 22 25  GLUCOSE 89 82  BUN 22 25*  CREATININE 1.21* 1.27*  CALCIUM 10.4 10.7*  MG -- --  PHOS -- --   Liver Function Tests:  Lab 01/05/12 1153  AST 21  ALT 8  ALKPHOS 49  BILITOT 0.6  PROT 7.4  ALBUMIN 3.9   No results found for this basename: LIPASE:5,AMYLASE:5 in the last 168 hours No results found for this basename: AMMONIA:5 in the last 168 hours CBC:  Lab 01/08/12 0718 01/07/12 0700 01/06/12 0508 01/05/12 1153  WBC 5.1 6.0 6.1 6.4  NEUTROABS -- -- -- 4.4  HGB 9.5* 9.8* 10.8* 10.8*  HCT 30.3* 30.4* 34.2* 33.6*  MCV 92.1 91.3 92.2 92.6  PLT 178 182 183 203   Cardiac  Enzymes:  Lab 01/06/12 0508 01/05/12 1732 01/05/12 1153  CKTOTAL -- -- --  CKMB -- -- --  CKMBINDEX -- -- --  TROPONINI 2.56* 3.29* 3.06*   BNP (last 3 results) No results found for this basename: PROBNP:3 in the last 8760 hours CBG:  Lab 01/07/12 1142 01/06/12 1600 01/06/12 0557 01/05/12 2334 01/05/12 1933  GLUCAP 82 101* 96 85 79    Recent Results (from the past 240 hour(s))  MRSA PCR SCREENING     Status: Normal   Collection Time   01/05/12  6:40 PM      Component Value Range Status Comment   MRSA by PCR NEGATIVE  NEGATIVE Final      Studies:  Recent x-ray studies have been reviewed in detail by the Attending Physician  Scheduled Meds:     . aspirin EC  81 mg Oral Daily  . coumadin book   Does not apply Once  . metoprolol  50 mg Oral BID  . simvastatin  20 mg Oral q1800  . sodium chloride  3 mL Intravenous Q12H  . warfarin  2.5 mg Oral ONCE-1800  . warfarin   Does not apply Once  . Warfarin - Pharmacist Dosing Inpatient   Does not apply q1800  . DISCONTD: metoprolol  tartrate  25 mg Oral BID      Glynnis Gavel [phone]  If 7PM-7AM, please contact night-coverage [email or web address] Password [email or web address] 01/08/2012, 9:17 AM   LOS: 3 days

## 2012-01-08 NOTE — Progress Notes (Signed)
ANTICOAGULATION CONSULT NOTE - Follow Up Consult  Pharmacy Consult for coumadin Indication:  CVA, afib  No Known Allergies  Patient Measurements: Height: 5\' 2"  (157.5 cm) Weight: 128 lb 4.9 oz (58.2 kg) IBW/kg (Calculated) : 50.1   Vital Signs: Temp: 97.7 F (36.5 C) (10/13 1340) Temp src: Oral (10/13 1340) BP: 131/63 mmHg (10/13 1340) Pulse Rate: 69  (10/13 1340)  Labs:  Basename 01/08/12 0718 01/07/12 0700 01/06/12 0832 01/06/12 0508 01/05/12 1732  HGB 9.5* 9.8* -- -- --  HCT 30.3* 30.4* -- 34.2* --  PLT 178 182 -- 183 --  APTT -- -- -- -- --  LABPROT 13.8 14.0 -- -- --  INR 1.07 1.09 -- -- --  HEPARINUNFRC -- 0.43 0.32 -- --  CREATININE 1.32* -- -- 1.21* --  CKTOTAL -- -- -- -- --  CKMB -- -- -- -- --  TROPONINI -- -- -- 2.56* 3.29*    Estimated Creatinine Clearance: 21.5 ml/min (by C-G formula based on Cr of 1.32).   Medications:  Scheduled:     . aspirin EC  81 mg Oral Daily  . coumadin book   Does not apply Once  . metoprolol  50 mg Oral BID  . simvastatin  20 mg Oral q1800  . sodium chloride  3 mL Intravenous Q12H  . warfarin  2.5 mg Oral ONCE-1800  . warfarin   Does not apply Once  . Warfarin - Pharmacist Dosing Inpatient   Does not apply q1800  . DISCONTD: metoprolol tartrate  25 mg Oral BID    Assessment: 78 yo female with afib/CVA and valvular disease on coumadin day 2 and INR=1.07 with no change from baseline.  Heparin has been discontinued per MD.  Goal of Therapy:  Goal INR 2-2.5 Monitor platelets by anticoagulation protocol: Yes   Plan:  -Continune coumadin 5mg  today -Daily PT/INR  Harland German, Pharm D 01/08/2012 1:44 PM

## 2012-01-09 DIAGNOSIS — N189 Chronic kidney disease, unspecified: Secondary | ICD-10-CM

## 2012-01-09 LAB — PROTIME-INR: INR: 1.06 (ref 0.00–1.49)

## 2012-01-09 MED ORDER — WARFARIN SODIUM 5 MG PO TABS
5.0000 mg | ORAL_TABLET | Freq: Once | ORAL | Status: AC
  Administered 2012-01-09: 5 mg via ORAL
  Filled 2012-01-09: qty 1

## 2012-01-09 NOTE — Progress Notes (Signed)
ANTICOAGULATION CONSULT NOTE - Follow Up Consult  Pharmacy Consult for coumadin Indication:  CVA, afib  No Known Allergies  Patient Measurements: Height: 5\' 2"  (157.5 cm) Weight: 136 lb 3.9 oz (61.8 kg) IBW/kg (Calculated) : 50.1   Vital Signs: Temp: 98.2 F (36.8 C) (10/14 1001) Temp src: Oral (10/14 1001) BP: 127/57 mmHg (10/14 1001) Pulse Rate: 75  (10/14 1001)  Labs:  Basename 01/09/12 0550 01/08/12 0718 01/07/12 0700  HGB -- 9.5* 9.8*  HCT -- 30.3* 30.4*  PLT -- 178 182  APTT -- -- --  LABPROT 13.7 13.8 14.0  INR 1.06 1.07 1.09  HEPARINUNFRC -- -- 0.43  CREATININE -- 1.32* --  CKTOTAL -- -- --  CKMB -- -- --  TROPONINI -- -- --    Estimated Creatinine Clearance: 23.5 ml/min (by C-G formula based on Cr of 1.32).   Medications:  Scheduled:     . aspirin EC  81 mg Oral Daily  . coumadin book   Does not apply Once  . metoprolol  50 mg Oral BID  . simvastatin  20 mg Oral q1800  . sodium chloride  3 mL Intravenous Q12H  . warfarin  5 mg Oral ONCE-1800  . warfarin   Does not apply Once  . Warfarin - Pharmacist Dosing Inpatient   Does not apply q1800    Assessment: 78yo presented to [facility] with right sided weakness. Her troponin was elevated and she had EKG changes. Afib/CVA and valvular disease   Anticoagulation - ACS/elevated troponins +Afib +CVA; INR=1.06; CT of head (-) for bleed; Started Warfarin 10/11 due to ongoing Afib. INR goal 2-2.5 on initial note.   Goal of Therapy:  Goal INR 2-2.5 Monitor platelets by anticoagulation protocol: Yes   Plan:  Repeat Coumadin 5mg  x1 again today. -Daily PT/INR  Calil Amor S. Merilynn Finland, PharmD, [facility] Clinical Staff Pharmacist Pager [phone]   01/09/2012 11:23 AM

## 2012-01-09 NOTE — Progress Notes (Signed)
Around 88 family called RN into room stating that pt was having trouble speaking. When RN arrived pt was able to answer questions, but was responding slower than usual. VS stable. Heart rhythm and rate unchanged. Oxygen level stable. No new neuro deficits seen. Pt stated that she felt better after her family member had lowered her head. Belia Heman, NP called regarding changes. No orders were given. Pt monitored through out night. Pt has not had another episode.  Salvadore Oxford, RN 01/08/12 [phone]

## 2012-01-09 NOTE — Progress Notes (Signed)
[facility] Progress Note [place] TEAM 1 - Stepdown/ICU TEAM   Judy Grant OZH:[id number] DOB: [birth year] DOA: 01/05/2012 PCP: [name], MD  Brief narrative: 78 year old female patient who lives at home and has history of paroxysmal atrial fibrillation. She had been experiencing right-sided weakness on the day prior to admission. EMS initially came to the house but left after patient was found to be asymptomatic. When the home health nurse arrived on the morning of admit she found the patient aphasic and unable to move her right side. The patient was subsequently transported to the [facility] emergency department where a CT of the head showed no acute abnormalities. In addition the patient was found to have EKG changes suspicious for ST elevation and troponin was elevated at 3.06 so she was transferred to [facility]. By the time she arrived to [facility] her speech had returned. She also has been started on a heparin drip at [facility] emergency department. Cardiology was consulted as well as neurology. She was felt to be out of the window for TPA. Because of concerns of evolving MI she was admitted to the ICU overnight on 10/9. Patient was seen by neurology and cardiology when she arrived to [facility] and she was not deemed a candidate for cardiac catheterization or neuro interventional procedures.   Assessment/Plan: Principal Problem:  *NSTEMI, initial episode of care Patient remained fairly asymptomatic , without any chest pain or malignant ventricular arrhythmias. She was not found to be a candidate for cardiac catheterization.  She only had very subtle ST segment elevations anteriorly leads and troponins started trending downward. She was treated with iv Heparin for 48 hours. Aspirin, statin and beta blockers were started on admission too.    CVA (cerebral infarction)/ Hemiplegia, unspecified, affecting dominant side *Patient presented with right hemiparesis  and aphasia .   Hemiplegia has improved and aphasia has resolved *MRI brain shows small areas of acute infarction in the parietal lobes bilaterally and neurology suspects these are embolic in etiology *Was also found to have severe stenosis in the parietal branches of the left middle cerebral artery *Because of embolic infarct in setting of history of PAF patient will require long-term anticoagulation with coumadin  Paroxysmal atrial fibrillation/intermittent atrial fibrillation with RVR Patient to be on lifelong coumadin after this admission  Patient on BB prior to admission. On 10/12 she had some pauses so BB stopped - by 10/13 she was in aflutter/fib with very high ventricular rate. Metoprolol 50 BID resumed 10/13.    Hyperlipidemia *Continue statin therapy    Hypertension *Blood pressure has been controlled this admission  *Continue beta blocker Allowing some permissive HTN in the setting of acute stroke.     CKD (chronic kidney disease) stage 3, GFR 30-59 ml/min *Creatinine stable on actually improved when compared to data from August 2013   ANEMIA-IRON DEFICIENCY *Hemoglobin stable and comparable to baseline of 10   Moderate aortic stenosis/Moderate mitral stenosis *Repeating echocardiogram this admission did not show any significant progression    Swelling of joint of right shoulder *Chronic issue   DVT prophylaxis: On full dose coumadin  Code Status: Full Family Communication: sister was at bedisde 10/12 - main communication person daughter Marli, [place] Daughter [phone]  Disposition Plan: SNF  Consultants: Cardiology Neurology  Procedures: MRI of the brain Small areas of acute infarction parietal lobe bilaterally. Probable artifact rather than acute infarct in the left pons.  MRA of the brain Severe stenosis in the parietal branch  of the left middle cerebral artery. This may account for the acute infarct on the left  2D Echocardiogram  Systolic function was normal. The estimated ejection fraction was in the range of 60% to 65%. - Aortic valve: Severely calcified annulus. Severely calcified leaflets. There was moderate stenosis. Mild regurgitation. Valve area: 1cm^2(VTI). Valve area: 1.13cm^2 (Vmax).  Carotid Doppler No significant stenosis  CXR Cardiomegaly and small left pleural effusion    HPI/Subjective: Up in the chair today reading the newspaper   Objective: Blood pressure 141/73, pulse 74, temperature 97.3 F (36.3 C), temperature source Oral, resp. rate 18, height 5\' 2"  (1.575 m), weight 61.8 kg (136 lb 3.9 oz), SpO2 99.00%.  Intake/Output Summary (Last 24 hours) at 01/09/12 1740 Last data filed at 01/09/12 1030  Gross per 24 hour  Intake      3 ml  Output      0 ml  Net      3 ml     Exam: General: No acute respiratory distress Lungs: Clear to auscultation bilaterally without wheezes or crackles, room air with saturations 97% Cardiovascular: Irregularly irregular, 3/6 diastolic murmur, and 4/6 systolic murmur at 2nd intercostal space Abdomen: Nontender, nondistended, soft, bowel sounds positive, no rebound, no ascites, no appreciable mass Neurological: Patient is awake and oriented x3, has 3/5 right hemiparesis , no aphasia   Data Reviewed: Basic Metabolic Panel:  Lab 01/08/12 9604 01/06/12 0508 01/05/12 1153  NA 138 138 137  K 4.9 4.5 4.2  CL 106 102 100  CO2 25 22 25   GLUCOSE 87 89 82  BUN 19 22 25*  CREATININE 1.32* 1.21* 1.27*  CALCIUM 9.7 10.4 10.7*  MG -- -- --  PHOS -- -- --   Liver Function Tests:  Lab 01/05/12 1153  AST 21  ALT 8  ALKPHOS 49  BILITOT 0.6  PROT 7.4  ALBUMIN 3.9   No results found for this basename: LIPASE:5,AMYLASE:5 in the last 168 hours No results found for this basename: AMMONIA:5 in the last 168 hours CBC:  Lab 01/08/12 0718 01/07/12 0700 01/06/12 0508 01/05/12 1153  WBC 5.1 6.0 6.1 6.4  NEUTROABS -- -- -- 4.4  HGB 9.5* 9.8* 10.8* 10.8*  HCT  30.3* 30.4* 34.2* 33.6*  MCV 92.1 91.3 92.2 92.6  PLT 178 182 183 203   Cardiac Enzymes:  Lab 01/06/12 0508 01/05/12 1732 01/05/12 1153  CKTOTAL -- -- --  CKMB -- -- --  CKMBINDEX -- -- --  TROPONINI 2.56* 3.29* 3.06*   BNP (last 3 results) No results found for this basename: PROBNP:3 in the last 8760 hours CBG:  Lab 01/07/12 1142 01/06/12 1600 01/06/12 0557 01/05/12 2334 01/05/12 1933  GLUCAP 82 101* 96 85 79    Recent Results (from the past 240 hour(s))  MRSA PCR SCREENING     Status: Normal   Collection Time   01/05/12  6:40 PM      Component Value Range Status Comment   MRSA by PCR NEGATIVE  NEGATIVE Final      Studies:  Recent x-ray studies have been reviewed in detail by the Attending Physician  Scheduled Meds:     . aspirin EC  81 mg Oral Daily  . coumadin book   Does not apply Once  . metoprolol  50 mg Oral BID  . simvastatin  20 mg Oral q1800  . sodium chloride  3 mL Intravenous Q12H  . warfarin  5 mg Oral ONCE-1800  . warfarin   Does not apply  Once  . Warfarin - Pharmacist Dosing Inpatient   Does not apply q1800      Aniesa Boback [phone]  If 7PM-7AM, please contact night-coverage [email or web address] Password [email or web address] 01/09/2012, 5:40 PM   LOS: 4 days

## 2012-01-09 NOTE — Clinical Social Work Placement (Signed)
    Clinical Social Work Department CLINICAL SOCIAL WORK PLACEMENT NOTE 01/09/2012  Patient:  Judy Grant, Judy Grant  Account Number:  [id number] Admit date:  01/05/2012  Clinical Social Worker:  Peggyann Shoals  Date/time:  01/09/2012 01:44 PM  Clinical Social Work is seeking post-discharge placement for this patient at the following level of care:   SKILLED NURSING   (*CSW will update this form in Epic as items are completed)   01/09/2012  Patient/family provided with [facility] Department of Clinical Social Work's list of facilities offering this level of care within the geographic area requested by the patient (or if unable, by the patient's family).  01/09/2012  Patient/family informed of their freedom to choose among providers that offer the needed level of care, that participate in Medicare, Medicaid or managed care program needed by the patient, have an available bed and are willing to accept the patient.  01/09/2012  Patient/family informed of [facility]' ownership interest in [facility], as well as of the fact that they are under no obligation to receive care at this facility.  PASARR submitted to EDS on 01/09/2012 PASARR number received from EDS on 01/09/2012  FL2 transmitted to all facilities in geographic area requested by pt/family on  01/09/2012 FL2 transmitted to all facilities within larger geographic area on   Patient informed that his/her managed care company has contracts with or will negotiate with  certain facilities, including the following:     Patient/family informed of bed offers received:  01/10/2012 Patient chooses bed at [facility] Physician recommends and patient chooses bed at  SNF  Patient to be transferred to [facility] on 01/10/2012 Patient to be transferred to facility by   The following physician request were entered in Epic:   Additional Comments:

## 2012-01-09 NOTE — Progress Notes (Signed)
Physical Therapy Treatment Patient Details Name: Judy Grant MRN: [id number] DOB: 1917/06/17 Today's Date: 01/09/2012 Time: 4098-1191 PT Time Calculation (min): 15 min  PT Assessment / Plan / Recommendation Comments on Treatment Session  78 y.o. female admitted to [facility] for NSTEMI and CVA with complication of recent L foot fracture in a CAM boot with questionable WB status.  This therapist attempted to contact Dr. Sanjuan Dame office [phone] (the MD following her for her foot) to check her WB status since she was due to see him on Friday 01/06/12 and the secreatery was unable to determine it from looking at her notes.  She states that she will ask in conference tomorrow and check with the doctor.      Follow Up Recommendations  Post acute inpatient     Does the patient have the potential to tolerate intense rehabilitation  No, Recommend SNF  Barriers to Discharge  none      Equipment Recommendations  None recommended by PT    Recommendations for Other Services  none  Frequency Min 3X/week   Plan Discharge plan remains appropriate;Frequency needs to be updated    Precautions / Restrictions Precautions Precautions: Fall Precaution Comments: Recent left foot fracture (nondisplaced medial malleolus per ED report on 12/01/11).  Has cam boot for mobility.  No weightbearing restrictions indicated in ED report of 12/01/11.Marland Kitchen Required Braces or Orthoses: Other Brace/Splint Other Brace/Splint: cam boot left foot Restrictions Weight Bearing Restrictions: No Other Position/Activity Restrictions: no weight bearing restrictions per ED note, pt reports she was supposed to go back to the bone doctor the day before she came to the hospital   Pertinent Vitals/Pain Reports bil chronic knee pain with gait, relieved at rest, did not rate.      Mobility  Transfers Sit to Stand: 4: Min assist;With armrests;With upper extremity assist;From chair/3-in-1 Stand to Sit: 4: Min assist;With  armrests;With upper extremity assist;To chair/3-in-1 Details for Transfer Assistance: min assist to boost pt's trunk up over weak legs and to help anteriorly weight shift body once standing.   Ambulation/Gait Ambulation/Gait Assistance: 4: Min assist Ambulation Distance (Feet): 40 Feet Assistive device: Rolling walker Ambulation/Gait Assistance Details: cues for upright posture min assist to steady pt for balance due to difficulty managing boot even with right shoe on to level her out and audible crepitius and giving way of bil knees with gait.  Gait Pattern: Step-to pattern;Shuffle;Trunk flexed Gait velocity: less than 1.8 ft/sec which indicates risk for recurrent falls    PT Goals Acute Rehab PT Goals PT Goal: Sit to Stand - Progress: Progressing toward goal PT Goal: Stand to Sit - Progress: Progressing toward goal PT Goal: Ambulate - Progress: Progressing toward goal  Visit Information  Last PT Received On: 01/09/12 Assistance Needed: +1    Subjective Data  Subjective: Pt reports that she thinks she can put weight through her foot in the CAM boot, but she is not sure and she has been using the [facility] a lot at home.     Cognition  Overall Cognitive Status: Appears within functional limits for tasks assessed/performed Arousal/Alertness: Awake/alert Behavior During Session: [facility] for tasks performed       End of Session PT - End of Session Activity Tolerance: Patient limited by fatigue;Patient limited by pain Patient left: in chair;with call bell/phone within reach        Ellerbe B. Muntaha Vermette, PT, DPT [phone]   01/09/2012, 5:06 PM

## 2012-01-09 NOTE — Progress Notes (Signed)
Occupational Therapy Treatment Patient Details Name: Judy Grant MRN: [id number] DOB: October 25, 1917 Today's Date: 01/09/2012 Time:  -1518                    Plan Discharge plan remains appropriate           ADL  Toilet Transfer: Moderate assistance;Other (comment) (from chair. Pt insisted sitting on bed pan) Toilet Transfer Method: Sit to stand Toilet Transfer Equipment: Other (comment) (sat on bed pan ) Toileting - Clothing Manipulation and Hygiene: Performed;Minimal assistance Where Assessed - Glass blower/designer Manipulation and Hygiene: Standing      OT Goals ADL Goals ADL Goal: Toilet Transfer - Progress: Progressing toward goals  Visit Information  Last OT Received On: 01/09/12          Cognition  Overall Cognitive Status: Appears within functional limits for tasks assessed/performed Arousal/Alertness: Awake/alert Behavior During Session: [facility] for tasks performed    Mobility  Shoulder Instructions Transfers Transfers: Sit to Stand;Stand to Sit Sit to Stand: From chair/3-in-1;4: Min assist;With upper extremity assist Stand to Sit: To chair/3-in-1;4: Min assist;With upper extremity assist             End of Session OT - End of Session Activity Tolerance: Patient tolerated treatment well Patient left: in chair;with call bell/phone within reach  GO     Madalene Mickler D 01/09/2012, 3:27 PM

## 2012-01-09 NOTE — Clinical Social Work Psychosocial (Signed)
     Clinical Social Work Department BRIEF PSYCHOSOCIAL ASSESSMENT 01/09/2012  Patient:  Judy Grant, Judy Grant     Account Number:  [id number]     Admit date:  01/05/2012  Clinical Social Worker:  Peggyann Shoals  Date/Time:  01/09/2012 01:39 PM  Referred by:  Physician  Date Referred:  01/07/2012 Referred for  SNF Placement   Other Referral:   Interview type:  Patient Other interview type:    PSYCHOSOCIAL DATA Living Status:  FAMILY Admitted from facility:   Level of care:   Primary support name:  Levette Paulick Primary support relationship to patient:  CHILD, ADULT Degree of support available:   Supportive.    CURRENT CONCERNS Current Concerns  Post-Acute Placement   Other Concerns:    SOCIAL WORK ASSESSMENT / PLAN CSW met with to address consult. CSW introduced herself and explained role of Child psychotherapist. CSW also explained the process of placement.    Pt lives with her handicapped adult son. During admission, pt's son is in the care of her sister. CSW also assisted with pt in problem solving around the care of her son while she is at the rehab center.    CSW will initiate SNF referral for [facility]. CSW will follow up with bed offers. CSW will continue to follow.   Assessment/plan status:  Psychosocial Support/Ongoing Assessment of Needs Other assessment/ plan:   Information/referral to community resources:   SNF List    PATIENTS/FAMILYS RESPONSE TO PLAN OF CARE: Pt was pleasant and agreeable to SNF placement.

## 2012-01-10 ENCOUNTER — Inpatient Hospital Stay
Admission: RE | Admit: 2012-01-10 | Discharge: 2012-01-23 | Disposition: A | Discharge status: Nursing Home (Includes ICF) | Payer: PRIVATE HEALTH INSURANCE | Source: Ambulatory Visit | Attending: Internal Medicine | Admitting: Internal Medicine

## 2012-01-10 LAB — PROTIME-INR
INR: 1.4 (ref 0.00–1.49)
Prothrombin Time: 16.8 seconds — ABNORMAL HIGH (ref 11.6–15.2)

## 2012-01-10 MED ORDER — ASPIRIN 81 MG PO TBEC: 81.0000 mg | DELAYED_RELEASE_TABLET | Freq: Every day | ORAL | Status: DC

## 2012-01-10 MED ORDER — TRAMADOL HCL 50 MG PO TABS: 50.0000 mg | ORAL_TABLET | Freq: Four times a day (QID) | ORAL | Status: DC | PRN

## 2012-01-10 MED ORDER — WARFARIN SODIUM 4 MG PO TABS: 4.0000 mg | ORAL_TABLET | Freq: Every day | ORAL | Status: DC

## 2012-01-10 NOTE — Discharge Summary (Signed)
Physician Discharge Summary  Judy Grant:[id number] DOB: January 30, 1918 DOA: 01/05/2012  PCP: Judy Overman, MD  Admit date: 01/05/2012 Discharge date: 01/10/2012  Recommendations for Outpatient Follow-up:  1. Daily PT/INR until INR>2  Discharge Diagnoses:  NSTEMI, initial episode of care  ANEMIA-IRON DEFICIENCY  Hyperlipidemia  Hypertension Bilateral embolic  CVAs  Moderate aortic stenosis  Moderate mitral stenosis  New onset a-fib  CKD (chronic kidney disease) stage 3, GFR 30-59 ml/min Chronic  Swelling of joint of right shoulder and dislocation    Discharge Condition: fair  Diet recommendation: heart healthy  Filed Weights   01/08/12 0500 01/09/12 0600 01/10/12 0423  Weight: 58.2 kg (128 lb 4.9 oz) 61.8 kg (136 lb 3.9 oz) 59.6 kg (131 lb 6.3 oz)    History of present illness:   Judy Grant is a 78 y.o. female with hx of rheumatic heart ds and PAF, who started having R side weakness yesterday. EMS came to the house and left after they did not find anything wrong with the patient. When the home health nurse arrived this AM she found the patient aphasic and unable to move her right side. Patient was transported to [facility] where a head CT did not indicate any acute abnormalities. Patient also had EKG changes of St elevation and Troponin was found to be 3.06 ng/ml so she got transferred to [facility]. She denies any chest pain or dyspnea. Currently her speech has returned. Patient was started on heparin drip at [facility] ED.   Hospital Course:  *NSTEMI, initial episode of care  Patient remained fairly asymptomatic , without any chest pain or malignant ventricular arrhythmias. She was not found to be a candidate for cardiac catheterization.  She only had very subtle ST segment elevations anteriorly leads and troponins started trending downward.  She was treated with iv Heparin for 48 hours.  Aspirin, statin and beta blockers were started on admission too.   CVA (cerebral  infarction)/ Hemiplegia, unspecified, affecting dominant side  *Patient presented with right hemiparesis and aphasia .  Hemiplegia has improved and aphasia has resolved  *MRI brain shows small areas of acute infarction in the parietal lobes bilaterally and neurology suspects these are embolic in etiology  *Was also found to have severe stenosis in the parietal branches of the left middle cerebral artery  *Because of embolic infarct in setting of history of PAF patient will require long-term anticoagulation with coumadin   Paroxysmal atrial fibrillation/intermittent atrial fibrillation with RVR  Patient to be on lifelong coumadin after this admission  Patient on BB prior to admission. On 10/12 she had some pauses so BB stopped - by 10/13 she was in aflutter/fib with very high ventricular rate. Metoprolol 50 BID resumed 10/13. To be discharged bacl on home dose BB. We did top ARB and diuretic in the setting of acute strokes.   Hyperlipidemia  *Continue statin therapy  Hypertension  *Blood pressure has been controlled this admission  *Continued  beta blocker  Allowing some permissive HTN in the setting of acute stroke.  CKD (chronic kidney disease) stage 3, GFR 30-59 ml/min  *Creatinine stable on actually improved when compared to data from August 2013  ANEMIA-IRON DEFICIENCY  *Hemoglobin stable and comparable to baseline of 10  Moderate aortic stenosis/Moderate mitral stenosis  *Repeating echocardiogram this admission did not show any significant progression  Swelling of joint of right shoulder  *Chronic issue      Procedures: MRI of the brain Small areas of acute infarction parietal lobe bilaterally.  Probable artifact rather than acute infarct in the left pons.  MRA of the brain Severe stenosis in the parietal branch of the left middle cerebral artery. This may account for the acute infarct on the left  2D Echocardiogram Systolic function was normal. The estimated ejection fraction  was in the range of 60% to 65%. - Aortic valve: Severely calcified annulus. Severely calcified leaflets. There was moderate stenosis. Mild regurgitation. Valve area: 1cm^2(VTI). Valve area: 1.13cm^2 (Vmax).  Carotid Doppler No significant stenosis  CXR Cardiomegaly and small left pleural effusion     Consultations:  Neurology  [facility]   Discharge Exam: Filed Vitals:   01/09/12 1813 01/09/12 2200 01/10/12 0200 01/10/12 0423  BP: 150/54 133/72 153/87 138/76  Pulse: 74 72 74 79  Temp: 97.5 F (36.4 C) 98 F (36.7 C) 98.2 F (36.8 C) 98.5 F (36.9 C)  TempSrc: Oral Oral Oral Oral  Resp: 17 18 18 18   Height:      Weight:    59.6 kg (131 lb 6.3 oz)  SpO2: 100% 97% 100% 98%    General: axox3 Cardiovascular: irreg irreg 3/6 murmur Respiratory: ctab   Discharge Instructions  Discharge Orders    Future Appointments: Provider: Department: Dept Phone: Center:   03/14/2012 10:45 AM Kerri Perches, MD [facility] [phone] [facility]     Future Orders Please Complete By Expires   Diet - low sodium heart healthy      Increase activity slowly          Medication List     As of 01/10/2012  8:54 AM    STOP taking these medications         DIOVAN 80 MG tablet   Generic drug: valsartan      furosemide 40 MG tablet   Commonly known as: LASIX      HYDROcodone-acetaminophen 5-500 MG per tablet   Commonly known as: VICODIN      potassium chloride 10 MEQ tablet   Commonly known as: K-DUR,KLOR-CON      TAKE these medications         alendronate 70 MG tablet   Commonly known as: FOSAMAX   Take with a full glass of water on an empty stomach.      allopurinol 100 MG tablet   Commonly known as: ZYLOPRIM   Take 100 mg by mouth daily.      aspirin 81 MG EC tablet   Take 1 tablet (81 mg total) by mouth daily.      docusate sodium 50 MG capsule   Commonly known as: COLACE   Take by mouth 2 (two) times daily.      fluticasone 50 MCG/ACT nasal  spray   Commonly known as: FLONASE   Place 2 sprays into the nose daily.      metoprolol 50 MG tablet   Commonly known as: LOPRESSOR   Take 50-75 mg by mouth 2 (two) times daily. TAKE 1 AND 1/2 TABLETS (75mg ) BYMOUTH IN THE MORNING AND1 TABLET (50mg ) IN THE EVENING.      OYSTER SHELL CALCIUM + D 500-400 MG-UNIT Tabs   Generic drug: Calcium Carb-Cholecalciferol   Take 1 tablet by mouth 3 (three) times daily.      polyethylene glycol packet   Commonly known as: MIRALAX / GLYCOLAX   Take 17 g by mouth daily.      simvastatin 20 MG tablet   Commonly known as: ZOCOR   Take 20 mg by mouth at bedtime. FOR  CHOLESTEROL.      traMADol 50 MG tablet   Commonly known as: ULTRAM   Take 1 tablet (50 mg total) by mouth every 6 (six) hours as needed for pain. Pain.      warfarin 4 MG tablet   Commonly known as: COUMADIN   Take 1 tablet (4 mg total) by mouth daily.           Follow-up Information    Follow up with Judy Overman, MD.   Contact information:   [address], [address] [place] [address] [phone]       Follow up with Englevale Bing, MD.   Contact information:   [address] [place] [address] [phone]       Please follow up. (snf md)           The results of significant diagnostics from this hospitalization (including imaging, microbiology, ancillary and laboratory) are listed below for reference.    Significant Diagnostic Studies: Ct Head Wo Contrast  01/05/2012  *RADIOLOGY REPORT*  Clinical Data: No known injury.  Right-sided weakness.  Difficulty with speaking for past 24 hours.  Hypertensive hyperlipidemic patient.  CT HEAD WITHOUT CONTRAST  Technique:  Contiguous axial images were obtained from the base of the skull through the vertex without contrast.  Comparison: 01/30/2008.  Findings: No intracranial hemorrhage.  Small vessel disease type changes without CT evidence of large acute infarct.  Global atrophy without hydrocephalus.  No  intracranial mass lesion detected on this unenhanced exam.  Vascular calcifications.  IMPRESSION:  No intracranial hemorrhage.  Small vessel disease type changes without CT evidence of large acute infarct.   Original Report Authenticated By: Fuller Canada, M.D.    Mr Brain Wo Contrast  01/06/2012  *RADIOLOGY REPORT*  Clinical Data:  Stroke.  Right-sided weakness  MRI HEAD WITHOUT CONTRAST MRA HEAD WITHOUT CONTRAST  Technique:  Multiplanar, multiecho pulse sequences of the brain and surrounding structures were obtained without intravenous contrast. Angiographic images of the head were obtained using MRA technique without contrast.  Comparison:  CT head 01/05/2012  MRI HEAD  Findings:  Small areas of acute infarct in the left parietal lobe and right medial parietal lobe.  Slight hyperintensity in the left pons is felt to be artifact rather than acute infarct but clinical correlation is suggested.  Moderate atrophy.  Mild chronic microvascular ischemic change in the cerebral white matter bilaterally.  Negative for intracranial hemorrhage.  No mass or edema is present.  No shift of the midline structures.  Mucosal thickening in the left maxillary sinus.  No air-fluid level in the sinuses.  IMPRESSION: Small areas of acute infarction parietal lobe bilaterally. Probable artifact rather than acute infarct in the left pons.  Atrophy and chronic microvascular ischemia.  MRA HEAD  Findings: Both vertebral arteries are patent to the basilar without significant stenosis.  The basilar is widely patent.  Superior cerebellar and posterior cerebral arteries are patent bilaterally.  Internal carotid artery is patent bilaterally without stenosis. Hypoplastic left A1 segment.  Both anterior cerebral arteries are patent.  Right middle cerebral artery is patent without stenosis.  Left M1 segment is patent.  There is a severe stenosis in the parietal branch of the left middle cerebral artery which may account for the acute infarct.   Negative for cerebral aneurysm.  IMPRESSION: Severe stenosis in the parietal branch of the left middle cerebral artery.  This may account for the acute infarct on the left.  No corresponding lesion  is seen to explain the small infarct in the right parietal lobe.   Original Report Authenticated By: Camelia Phenes, M.D.    Dg Chest Port 1 View  01/06/2012  *RADIOLOGY REPORT*  Clinical Data: Stroke.  Weakness.  PORTABLE CHEST - 1 VIEW  Comparison: 01/05/2012.02/04/2010.  Findings: Lung volumes are lower than prior.  Small left pleural effusion.  Cardiopericardial silhouette is enlarged.  Tortuous thoracic aorta. Left basilar atelectasis.  No airspace disease.  IMPRESSION: Cardiomegaly and small left pleural effusion.  No lobar consolidation to suggest pneumonia.   Original Report Authenticated By: Andreas Newport, M.D.    Dg Shoulder Right Port  01/05/2012  *RADIOLOGY REPORT*  Clinical Data: Shoulder pain and deformity.  PORTABLE RIGHT SHOULDER - 2+ VIEW  Comparison: None.  Findings: No evidence of acute fracture.  Chronic deformity with remodeling of the right humeral head is seen, which may be due to old fracture or avascular necrosis.  There is mild anterior subluxation of the humeral head in relation to the glenoid which is also likely chronic.  Glenoid is poorly profiled on this exam and is difficult to evaluate.  There is mild degenerative spurring of the acromioclavicular joint.  Generalized osteopenia noted.  IMPRESSION:  1.  No acute findings. 2.  Chronic deformity and remodeling of the right humeral head with mild anterior subluxation.  This may be due to old fracture or avascular necrosis. 3.  Mild acromioclavicular degenerative spurring. 4.  Osteopenia.   Original Report Authenticated By: Danae Orleans, M.D.    Mr Mra Head/brain Wo Cm  01/06/2012  *RADIOLOGY REPORT*  Clinical Data:  Stroke.  Right-sided weakness  MRI HEAD WITHOUT CONTRAST MRA HEAD WITHOUT CONTRAST  Technique:  Multiplanar,  multiecho pulse sequences of the brain and surrounding structures were obtained without intravenous contrast. Angiographic images of the head were obtained using MRA technique without contrast.  Comparison:  CT head 01/05/2012  MRI HEAD  Findings:  Small areas of acute infarct in the left parietal lobe and right medial parietal lobe.  Slight hyperintensity in the left pons is felt to be artifact rather than acute infarct but clinical correlation is suggested.  Moderate atrophy.  Mild chronic microvascular ischemic change in the cerebral white matter bilaterally.  Negative for intracranial hemorrhage.  No mass or edema is present.  No shift of the midline structures.  Mucosal thickening in the left maxillary sinus.  No air-fluid level in the sinuses.  IMPRESSION: Small areas of acute infarction parietal lobe bilaterally. Probable artifact rather than acute infarct in the left pons.  Atrophy and chronic microvascular ischemia.  MRA HEAD  Findings: Both vertebral arteries are patent to the basilar without significant stenosis.  The basilar is widely patent.  Superior cerebellar and posterior cerebral arteries are patent bilaterally.  Internal carotid artery is patent bilaterally without stenosis. Hypoplastic left A1 segment.  Both anterior cerebral arteries are patent.  Right middle cerebral artery is patent without stenosis.  Left M1 segment is patent.  There is a severe stenosis in the parietal branch of the left middle cerebral artery which may account for the acute infarct.  Negative for cerebral aneurysm.  IMPRESSION: Severe stenosis in the parietal branch of the left middle cerebral artery.  This may account for the acute infarct on the left.  No corresponding lesion is seen to explain the small infarct in the right parietal lobe.   Original Report Authenticated By: Camelia Phenes, M.D.     Microbiology: Recent Results (from the  past 240 hour(s))  MRSA PCR SCREENING     Status: Normal   Collection Time    01/05/12  6:40 PM      Component Value Range Status Comment   MRSA by PCR NEGATIVE  NEGATIVE Final      Labs: Basic Metabolic Panel:  Lab 01/08/12 4098 01/06/12 0508 01/05/12 1153  NA 138 138 137  K 4.9 4.5 4.2  CL 106 102 100  CO2 25 22 25   GLUCOSE 87 89 82  BUN 19 22 25*  CREATININE 1.32* 1.21* 1.27*  CALCIUM 9.7 10.4 10.7*  MG -- -- --  PHOS -- -- --   Liver Function Tests:  Lab 01/05/12 1153  AST 21  ALT 8  ALKPHOS 49  BILITOT 0.6  PROT 7.4  ALBUMIN 3.9   No results found for this basename: LIPASE:5,AMYLASE:5 in the last 168 hours No results found for this basename: AMMONIA:5 in the last 168 hours CBC:  Lab 01/08/12 0718 01/07/12 0700 01/06/12 0508 01/05/12 1153  WBC 5.1 6.0 6.1 6.4  NEUTROABS -- -- -- 4.4  HGB 9.5* 9.8* 10.8* 10.8*  HCT 30.3* 30.4* 34.2* 33.6*  MCV 92.1 91.3 92.2 92.6  PLT 178 182 183 203   Cardiac Enzymes:  Lab 01/06/12 0508 01/05/12 1732 01/05/12 1153  CKTOTAL -- -- --  CKMB -- -- --  CKMBINDEX -- -- --  TROPONINI 2.56* 3.29* 3.06*   BNP: BNP (last 3 results) No results found for this basename: PROBNP:3 in the last 8760 hours CBG:  Lab 01/07/12 1142 01/06/12 1600 01/06/12 0557 01/05/12 2334 01/05/12 1933  GLUCAP 82 101* 96 85 79    Time coordinating discharge: 45 minutes  Signed:  Colleen Kotlarz  [facility] 01/10/2012, 8:54 AM

## 2012-01-10 NOTE — Progress Notes (Signed)
Occupational Therapy Treatment Patient Details Name: Judy Grant MRN: [id number] DOB: Oct 27, 1917 Today's Date: 01/10/2012 Time: 4098-1191 OT Time Calculation (min): 10 min         Plan Discharge plan remains appropriate    Precautions / Restrictions Precautions Precautions: Fall Precaution Comments: Recent left foot fracture (nondisplaced medial malleolus per ED report on 12/01/11).  Has cam boot for mobility.  No weightbearing restrictions indicated in ED report of 12/01/11.Marland Kitchen Required Braces or Orthoses: Other Brace/Splint Other Brace/Splint: cam boot left foot Restrictions Weight Bearing Restrictions: No Other Position/Activity Restrictions: no weight bearing restrictions per ED note, pt reports she was supposed to go back to the bone doctor the day before she came to the hospital       ADL  Lower Body Dressing: Performed;Maximal assistance Where Assessed - Lower Body Dressing: Supported sit to stand Toilet Transfer: Moderate assistance;Other (comment) (from chair. Pt insisted sitting on bed pan) Toilet Transfer Method: Sit to stand Toilet Transfer Equipment: Other (comment) (sat on bed pan ) Toileting - Clothing Manipulation and Hygiene: Performed;Moderate assistance;Other (comment) (pt needed increased A today with sit to stand) Where Assessed - Toileting Clothing Manipulation and Hygiene: Standing ADL Comments: Pt needed increased A today with sit to stand. Pt reports fatigue and needing to rest.  Pt left in recliner with feet propped up       OT Goals ADL Goals ADL Goal: Lower Body Dressing - Progress: Progressing toward goals ADL Goal: Toilet Transfer - Progress: Progressing toward goals  Visit Information  Last OT Received On: 01/10/12    Subjective Data  Subjective: that boot is so heavy! lets take it off      Cognition  Overall Cognitive Status: Appears within functional limits for tasks assessed/performed Arousal/Alertness: Awake/alert     Mobility  Shoulder Instructions Transfers Sit to Stand: With armrests;With upper extremity assist;From chair/3-in-1;3: Mod assist Stand to Sit: With armrests;With upper extremity assist;To chair/3-in-1;3: Mod assist Details for Transfer Assistance: min assist to boost pt's trunk up over weak legs and to help anteriorly weight shift body once standing.               End of Session OT - End of Session Activity Tolerance: Patient limited by fatigue Patient left: in chair;with call bell/phone within reach  GO     [facility], [place] Kung 01/10/2012, 2:28 PM

## 2012-01-10 NOTE — Clinical Social Work Note (Signed)
Clinical Social Work  Pt is ready for discharge today to [facility]. Facility has received discharge summary and is ready to accept pt. Pt and family are agreeable to discharge plan. [facility] will provide transportation. CSW is signing off as no further needs identified.   Dede Query, MSW, Theresia Majors [phone]

## 2012-01-23 ENCOUNTER — Emergency Department (HOSPITAL_COMMUNITY)
Admission: EM | Admit: 2012-01-23 | Discharge: 2012-01-23 | Disposition: A | Discharge status: Skilled Nursing Facility | Payer: PRIVATE HEALTH INSURANCE | Attending: Emergency Medicine | Admitting: Emergency Medicine

## 2012-01-23 ENCOUNTER — Encounter (HOSPITAL_COMMUNITY): Payer: Self-pay | Admitting: Emergency Medicine

## 2012-01-23 ENCOUNTER — Inpatient Hospital Stay
Admission: RE | Admit: 2012-01-23 | Discharge: 2012-02-21 | Disposition: A | Discharge status: Home / Self Care | Payer: PRIVATE HEALTH INSURANCE | Source: Ambulatory Visit | Attending: Internal Medicine | Admitting: Internal Medicine

## 2012-01-23 ENCOUNTER — Emergency Department (HOSPITAL_COMMUNITY): Payer: PRIVATE HEALTH INSURANCE

## 2012-01-23 DIAGNOSIS — Z8673 Personal history of transient ischemic attack (TIA), and cerebral infarction without residual deficits: Secondary | ICD-10-CM | POA: Insufficient documentation

## 2012-01-23 DIAGNOSIS — I4891 Unspecified atrial fibrillation: Secondary | ICD-10-CM | POA: Insufficient documentation

## 2012-01-23 DIAGNOSIS — N289 Disorder of kidney and ureter, unspecified: Secondary | ICD-10-CM

## 2012-01-23 DIAGNOSIS — R05 Cough: Secondary | ICD-10-CM | POA: Insufficient documentation

## 2012-01-23 DIAGNOSIS — M810 Age-related osteoporosis without current pathological fracture: Secondary | ICD-10-CM | POA: Insufficient documentation

## 2012-01-23 DIAGNOSIS — Z7982 Long term (current) use of aspirin: Secondary | ICD-10-CM | POA: Insufficient documentation

## 2012-01-23 DIAGNOSIS — J189 Pneumonia, unspecified organism: Secondary | ICD-10-CM

## 2012-01-23 DIAGNOSIS — Z8781 Personal history of (healed) traumatic fracture: Secondary | ICD-10-CM | POA: Insufficient documentation

## 2012-01-23 DIAGNOSIS — I359 Nonrheumatic aortic valve disorder, unspecified: Secondary | ICD-10-CM | POA: Insufficient documentation

## 2012-01-23 DIAGNOSIS — Z7901 Long term (current) use of anticoagulants: Secondary | ICD-10-CM | POA: Insufficient documentation

## 2012-01-23 DIAGNOSIS — Z8719 Personal history of other diseases of the digestive system: Secondary | ICD-10-CM | POA: Insufficient documentation

## 2012-01-23 DIAGNOSIS — N189 Chronic kidney disease, unspecified: Secondary | ICD-10-CM | POA: Insufficient documentation

## 2012-01-23 DIAGNOSIS — Z87891 Personal history of nicotine dependence: Secondary | ICD-10-CM | POA: Insufficient documentation

## 2012-01-23 DIAGNOSIS — E785 Hyperlipidemia, unspecified: Secondary | ICD-10-CM | POA: Insufficient documentation

## 2012-01-23 DIAGNOSIS — M109 Gout, unspecified: Secondary | ICD-10-CM | POA: Insufficient documentation

## 2012-01-23 DIAGNOSIS — R059 Cough, unspecified: Secondary | ICD-10-CM | POA: Insufficient documentation

## 2012-01-23 DIAGNOSIS — M1990 Unspecified osteoarthritis, unspecified site: Secondary | ICD-10-CM | POA: Insufficient documentation

## 2012-01-23 DIAGNOSIS — Z862 Personal history of diseases of the blood and blood-forming organs and certain disorders involving the immune mechanism: Secondary | ICD-10-CM | POA: Insufficient documentation

## 2012-01-23 DIAGNOSIS — Z79899 Other long term (current) drug therapy: Secondary | ICD-10-CM | POA: Insufficient documentation

## 2012-01-23 DIAGNOSIS — E875 Hyperkalemia: Secondary | ICD-10-CM | POA: Insufficient documentation

## 2012-01-23 DIAGNOSIS — R062 Wheezing: Secondary | ICD-10-CM | POA: Insufficient documentation

## 2012-01-23 HISTORY — DX: Cerebral infarction, unspecified: I63.9

## 2012-01-23 LAB — URINE MICROSCOPIC-ADD ON

## 2012-01-23 LAB — URINALYSIS, ROUTINE W REFLEX MICROSCOPIC
Bilirubin Urine: NEGATIVE
Ketones, ur: NEGATIVE mg/dL
Nitrite: NEGATIVE
Protein, ur: NEGATIVE mg/dL
Urobilinogen, UA: 0.2 mg/dL (ref 0.0–1.0)

## 2012-01-23 LAB — CBC WITH DIFFERENTIAL/PLATELET
Basophils Relative: 1 % (ref 0–1)
Eosinophils Absolute: 0.3 10*3/uL (ref 0.0–0.7)
Eosinophils Relative: 5 % (ref 0–5)
Lymphs Abs: 1.6 10*3/uL (ref 0.7–4.0)
MCH: 29.9 pg (ref 26.0–34.0)
MCHC: 31.8 g/dL (ref 30.0–36.0)
MCV: 94.2 fL (ref 78.0–100.0)
Monocytes Relative: 4 % (ref 3–12)
Platelets: 250 10*3/uL (ref 150–400)
RBC: 3.44 MIL/uL — ABNORMAL LOW (ref 3.87–5.11)

## 2012-01-23 LAB — COMPREHENSIVE METABOLIC PANEL
Albumin: 3.6 g/dL (ref 3.5–5.2)
BUN: 28 mg/dL — ABNORMAL HIGH (ref 6–23)
Calcium: 10.6 mg/dL — ABNORMAL HIGH (ref 8.4–10.5)
GFR calc Af Amer: 37 mL/min — ABNORMAL LOW (ref >90)
Glucose, Bld: 131 mg/dL — ABNORMAL HIGH (ref 70–99)
Sodium: 134 mEq/L — ABNORMAL LOW (ref 135–145)
Total Protein: 7.4 g/dL (ref 6.0–8.3)

## 2012-01-23 MED ORDER — ALBUTEROL SULFATE (5 MG/ML) 0.5% IN NEBU
2.5000 mg | INHALATION_SOLUTION | Freq: Once | RESPIRATORY_TRACT | Status: AC
  Administered 2012-01-23: 2.5 mg via RESPIRATORY_TRACT
  Filled 2012-01-23: qty 0.5

## 2012-01-23 MED ORDER — SODIUM POLYSTYRENE SULFONATE PO POWD: Freq: Once | ORAL | Status: DC

## 2012-01-23 MED ORDER — SODIUM CHLORIDE 0.9 % IV SOLN
Freq: Once | INTRAVENOUS | Status: AC
  Administered 2012-01-23: 05:00:00 via INTRAVENOUS

## 2012-01-23 MED ORDER — MOXIFLOXACIN HCL IN NACL 400 MG/250ML IV SOLN: 400.0000 mg | Freq: Once | INTRAVENOUS | Status: DC

## 2012-01-23 MED ORDER — MOXIFLOXACIN HCL 400 MG PO TABS: 400.0000 mg | ORAL_TABLET | Freq: Every day | ORAL | Status: DC

## 2012-01-23 MED ORDER — MOXIFLOXACIN HCL 400 MG PO TABS
400.0000 mg | ORAL_TABLET | Freq: Every day | ORAL | Status: DC
  Administered 2012-01-23: 400 mg via ORAL
  Filled 2012-01-23: qty 1

## 2012-01-23 MED ORDER — IPRATROPIUM BROMIDE 0.02 % IN SOLN
0.5000 mg | Freq: Once | RESPIRATORY_TRACT | Status: AC
  Administered 2012-01-23: 0.5 mg via RESPIRATORY_TRACT
  Filled 2012-01-23: qty 2.5

## 2012-01-23 NOTE — ED Notes (Signed)
Pt resting with family at bedside

## 2012-01-23 NOTE — ED Notes (Signed)
Patient came in from [facility] was soiled with stool. Had to clean her up to continue care.

## 2012-01-23 NOTE — ED Notes (Signed)
Family at bedside. Patient states she does not need anything at this time. 

## 2012-01-23 NOTE — ED Notes (Signed)
Patient presents to ER via [facility] from [facility] with c/o hypertension, wheezing, sweating and nonproductive cough.

## 2012-01-23 NOTE — ED Provider Notes (Signed)
History     CSN: [id number]  Arrival date & time 01/23/12  4098   First MD Initiated Contact with Patient 01/23/12 [phone]      Chief Complaint  Patient presents with  . Hypertension  . Wheezing  . Cough    (Consider location/radiation/quality/duration/timing/severity/associated sxs/prior treatment) HPI Judy Grant is a 78 y.o. female, patient at SNF  brought in by ambulance, who presents to the Emergency Department complaining of cough, shortness of breath and wheezing that began last night. Patient recently admitted to [facility] s/p stroke.O2 sats on arrival were 88%.  PCP Dr. Lodema Hong   Past Medical History  Diagnosis Date  . Hyperlipidemia   . Osteoporosis   . Hypertension     with severe left ventricular hypertrophy; normal ejection fraction in 04/2005  . Valvular heart disease     Mild to moderate aortic stenosis; moderate to severe mitral stenosis in 2007; mild MR  . Paroxysmal atrial fibrillation     Remote  . Diverticulosis     Pancolonic; h/o LGI bleeding and diverticulitis  . Degenerative joint disease     Knees  . Chronic kidney disease     Mild; creatinine of 1.19-1.28 in recent years  . Anemia, iron deficiency   . Popliteal cyst     Left  . Fracture of wrist 2008    left  . Gout   . Stroke     Past Surgical History  Procedure Date  . Appendectomy 1944  . Total hip arthroplasty 1994    Family History  Problem Relation Age of Onset  . Diabetes Mother     History  Substance Use Topics  . Smoking status: Former Smoker    Quit date: 08/31/1972  . Smokeless tobacco: Never Used  . Alcohol Use: No    OB History    Grav Para Term Preterm Abortions TAB SAB Ect Mult Living                  Review of Systems  Constitutional: Negative for fever.       10 Systems reviewed and are negative for acute change except as noted in the HPI.  HENT: Negative for congestion.   Eyes: Negative for discharge and redness.  Respiratory: Positive for  cough and shortness of breath.   Cardiovascular: Negative for chest pain.  Gastrointestinal: Negative for vomiting and abdominal pain.  Musculoskeletal: Negative for back pain.  Skin: Negative for rash.  Neurological: Negative for syncope, numbness and headaches.  Psychiatric/Behavioral:       No behavior change.    Allergies  Review of patient's allergies indicates no known allergies.  Home Medications   Current Outpatient Rx  Name Route Sig Dispense Refill  . ALENDRONATE SODIUM 70 MG PO TABS  Take with a full glass of water on an empty stomach. 4 tablet 5  . ALLOPURINOL 100 MG PO TABS Oral Take 100 mg by mouth daily.    . ASPIRIN 81 MG PO TBEC Oral Take 1 tablet (81 mg total) by mouth daily.    Marland Kitchen CALCIUM CARB-CHOLECALCIFEROL 500-400 MG-UNIT PO TABS Oral Take 1 tablet by mouth 3 (three) times daily.     Marland Kitchen DOCUSATE SODIUM 50 MG PO CAPS Oral Take by mouth 2 (two) times daily.     Marland Kitchen FLUTICASONE PROPIONATE 50 MCG/ACT NA SUSP Nasal Place 2 sprays into the nose daily.     Marland Kitchen METOPROLOL TARTRATE 50 MG PO TABS Oral Take 50 mg by mouth daily. TAKE  1 AND 1/2 TABLETS (75mg ) BYMOUTH IN THE MORNING AND1 TABLET (50mg ) IN THE EVENING.    Marland Kitchen POLYETHYLENE GLYCOL 3350 PO PACK Oral Take 17 g by mouth daily.    Marland Kitchen SIMVASTATIN 20 MG PO TABS Oral Take 20 mg by mouth at bedtime. FOR CHOLESTEROL.    Marland Kitchen TRAMADOL HCL 50 MG PO TABS Oral Take 1 tablet (50 mg total) by mouth every 6 (six) hours as needed for pain. Pain. 30 tablet 0  . WARFARIN SODIUM 3 MG PO TABS Oral Take 3 mg by mouth daily.    . WARFARIN SODIUM 4 MG PO TABS Oral Take 1 tablet (4 mg total) by mouth daily.      SpO2 91%  Physical Exam  Nursing note and vitals reviewed. Constitutional:       Awake, alert, frail, elderly woman.  HENT:  Head: Normocephalic and atraumatic.  Eyes: EOM are normal. Pupils are equal, round, and reactive to light. Right eye exhibits no discharge. Left eye exhibits no discharge.  Neck: Neck supple.  Cardiovascular:    Murmur heard.      Irregularly irregular  Pulmonary/Chest: Effort normal. She has wheezes. She exhibits no tenderness.       Dry cough  Abdominal: Soft. Bowel sounds are normal. There is no tenderness. There is no rebound.  Musculoskeletal: Normal range of motion. She exhibits no tenderness.       Baseline ROM, no obvious new focal weakness.  Neurological: She is alert.       Mental status and motor strength appears baseline for patient and situation.  Skin: No rash noted.  Psychiatric: She has a normal mood and affect.    ED Course  Procedures (including critical care time)  Results for orders placed during the hospital encounter of 01/23/12  CBC WITH DIFFERENTIAL      Component Value Range   WBC 5.7  4.0 - 10.5 K/uL   RBC 3.44 (*) 3.87 - 5.11 MIL/uL   Hemoglobin 10.3 (*) 12.0 - 15.0 g/dL   HCT 96.0 (*) 45.4 - 09.8 %   MCV 94.2  78.0 - 100.0 fL   MCH 29.9  26.0 - 34.0 pg   MCHC 31.8  30.0 - 36.0 g/dL   RDW 11.9  14.7 - 82.9 %   Platelets 250  150 - 400 K/uL   Neutrophils Relative 63  43 - 77 %   Neutro Abs 3.6  1.7 - 7.7 K/uL   Lymphocytes Relative 28  12 - 46 %   Lymphs Abs 1.6  0.7 - 4.0 K/uL   Monocytes Relative 4  3 - 12 %   Monocytes Absolute 0.2  0.1 - 1.0 K/uL   Eosinophils Relative 5  0 - 5 %   Eosinophils Absolute 0.3  0.0 - 0.7 K/uL   Basophils Relative 1  0 - 1 %   Basophils Absolute 0.1  0.0 - 0.1 K/uL  COMPREHENSIVE METABOLIC PANEL      Component Value Range   Sodium 134 (*) 135 - 145 mEq/L   Potassium 5.2 (*) 3.5 - 5.1 mEq/L   Chloride 98  96 - 112 mEq/L   CO2 26  19 - 32 mEq/L   Glucose, Bld 131 (*) 70 - 99 mg/dL   BUN 28 (*) 6 - 23 mg/dL   Creatinine, Ser 5.62 (*) 0.50 - 1.10 mg/dL   Calcium 13.0 (*) 8.4 - 10.5 mg/dL   Total Protein 7.4  6.0 - 8.3 g/dL   Albumin 3.6  3.5 - 5.2 g/dL   AST 20  0 - 37 U/L   ALT 15  0 - 35 U/L   Alkaline Phosphatase 53  39 - 117 U/L   Total Bilirubin 0.3  0.3 - 1.2 mg/dL   GFR calc non Af Amer 32 (*) >90 mL/min    GFR calc Af Amer 37 (*) >90 mL/min  CULTURE, BLOOD (ROUTINE X 2)      Component Value Range   Specimen Description Blood LEFT ARM     Special Requests NONE 8CC     Culture PENDING     Report Status PENDING    URINALYSIS, ROUTINE W REFLEX MICROSCOPIC      Component Value Range   Color, Urine YELLOW  YELLOW   APPearance CLEAR  CLEAR   Specific Gravity, Urine 1.015  1.005 - 1.030   pH 6.0  5.0 - 8.0   Glucose, UA NEGATIVE  NEGATIVE mg/dL   Hgb urine dipstick SMALL (*) NEGATIVE   Bilirubin Urine NEGATIVE  NEGATIVE   Ketones, ur NEGATIVE  NEGATIVE mg/dL   Protein, ur NEGATIVE  NEGATIVE mg/dL   Urobilinogen, UA 0.2  0.0 - 1.0 mg/dL   Nitrite NEGATIVE  NEGATIVE   Leukocytes, UA NEGATIVE  NEGATIVE  TROPONIN I      Component Value Range   Troponin I <0.30  <0.30 ng/mL  PROTIME-INR      Component Value Range   Prothrombin Time 23.4 (*) 11.6 - 15.2 seconds   INR 2.19 (*) 0.00 - 1.49  URINE MICROSCOPIC-ADD ON      Component Value Range   RBC / HPF 0-2  <3 RBC/hpf   Bacteria, UA RARE  RARE    Date: 01/23/2012  0516  Rate: 88  Rhythm: atrial fibrillation and premature atrial contractions (PAC)  QRS Axis: normal  Intervals: atrial fib  ST/T Wave abnormalities: normal  Conduction Disutrbances:atrial fib  Narrative Interpretation:   Old EKG Reviewed: unchanged  Dg Chest Port 1 View  01/23/2012  *RADIOLOGY REPORT*  Clinical Data:  Hypertension, anemia.  Atrial fibrillation.  PORTABLE CHEST - 1 VIEW  Comparison: Single view of the chest 01/06/2012 and PA and lateral chest 02/04/2010.  Findings: The patient has a new right pleural effusion with airspace disease in the right mid and lower lung zones.  Tiny left effusion persists.  There is cardiomegaly and pulmonary vascular congestion.  IMPRESSION:  1.  New right pleural effusion and basilar airspace disease could be due to pneumonia or aspiration. 2.  No change in a small left pleural effusion. 3.  Pulmonary vascular congestion.   Original  Report Authenticated By: Bernadene Bell. D'ALESSIO, M.D.     MDM  SNF patient here with shortness of breath, cough and wheezing. Labs with continued renal insufficiency, elevated potassium, stable anemia. Given albuterol/atrovent  X 2 with improvement.Xray with new right pleural effusion and probable pneumonia. Initiated antibiotic therapy. Patient to return to SNF. Dx testing d/w pt and daughter.  Questions answered.   Pt feels improved after observation and/or treatment in ED.Pt stable in ED with no significant deterioration in condition.The patient appears reasonably screened and/or stabilized for discharge and I doubt any other medical condition or other [facility] requiring further screening, evaluation, or treatment in the ED at this time prior to discharge.  MDM Reviewed: nursing note, vitals and previous chart Reviewed previous: labs, ECG and x-ray Interpretation: labs, ECG and x-ray Total time providing critical care: 30 minutes.  Nicoletta Dress. Colon Branch, MD 01/23/12 [phone]

## 2012-01-28 LAB — CULTURE, BLOOD (ROUTINE X 2): Culture: NO GROWTH

## 2012-02-27 ENCOUNTER — Telehealth: Payer: Self-pay | Admitting: Family Medicine

## 2012-02-27 ENCOUNTER — Other Ambulatory Visit: Payer: Self-pay

## 2012-02-27 MED ORDER — ASPIRIN 81 MG PO TBEC: 81.0000 mg | DELAYED_RELEASE_TABLET | Freq: Every day | ORAL | Status: DC

## 2012-02-27 MED ORDER — DOCUSATE SODIUM 50 MG PO CAPS: 50.0000 mg | ORAL_CAPSULE | Freq: Two times a day (BID) | ORAL | Status: DC

## 2012-02-27 MED ORDER — POLYETHYLENE GLYCOL 3350 17 G PO PACK: 17.0000 g | PACK | Freq: Every day | ORAL | Status: DC

## 2012-02-27 MED ORDER — METOPROLOL TARTRATE 50 MG PO TABS: 50.0000 mg | ORAL_TABLET | Freq: Every day | ORAL | Status: DC

## 2012-02-27 MED ORDER — ALLOPURINOL 100 MG PO TABS: 100.0000 mg | ORAL_TABLET | Freq: Every day | ORAL | Status: DC

## 2012-02-27 NOTE — Telephone Encounter (Signed)
Called and she was not there at the moment and Ms garret will have her call back

## 2012-02-28 ENCOUNTER — Telehealth: Payer: Self-pay

## 2012-02-28 NOTE — Telephone Encounter (Signed)
Continue current dose of coumadin, please let them know

## 2012-02-28 NOTE — Telephone Encounter (Signed)
Patient is referred to the Coumadin Clinic.   Results of INR to be drawn on 12/9 to be faxed to coumadin clinic when received.

## 2012-02-28 NOTE — Telephone Encounter (Signed)
Called but line is busy. Will try again.

## 2012-02-29 NOTE — Telephone Encounter (Signed)
Patient and caregiver aware.  

## 2012-03-02 DIAGNOSIS — I214 Non-ST elevation (NSTEMI) myocardial infarction: Secondary | ICD-10-CM

## 2012-03-02 DIAGNOSIS — I69998 Other sequelae following unspecified cerebrovascular disease: Secondary | ICD-10-CM

## 2012-03-02 DIAGNOSIS — M6281 Muscle weakness (generalized): Secondary | ICD-10-CM

## 2012-03-02 DIAGNOSIS — R269 Unspecified abnormalities of gait and mobility: Secondary | ICD-10-CM

## 2012-03-06 ENCOUNTER — Ambulatory Visit (INDEPENDENT_AMBULATORY_CARE_PROVIDER_SITE_OTHER): Payer: PRIVATE HEALTH INSURANCE | Admitting: *Deleted

## 2012-03-06 DIAGNOSIS — I4891 Unspecified atrial fibrillation: Secondary | ICD-10-CM

## 2012-03-06 DIAGNOSIS — I6350 Cerebral infarction due to unspecified occlusion or stenosis of unspecified cerebral artery: Secondary | ICD-10-CM

## 2012-03-06 DIAGNOSIS — I480 Paroxysmal atrial fibrillation: Secondary | ICD-10-CM

## 2012-03-06 DIAGNOSIS — I639 Cerebral infarction, unspecified: Secondary | ICD-10-CM

## 2012-03-06 DIAGNOSIS — Z7901 Long term (current) use of anticoagulants: Secondary | ICD-10-CM | POA: Insufficient documentation

## 2012-03-07 ENCOUNTER — Other Ambulatory Visit: Payer: Self-pay | Admitting: Family Medicine

## 2012-03-09 ENCOUNTER — Ambulatory Visit (INDEPENDENT_AMBULATORY_CARE_PROVIDER_SITE_OTHER): Payer: PRIVATE HEALTH INSURANCE | Admitting: *Deleted

## 2012-03-09 DIAGNOSIS — I480 Paroxysmal atrial fibrillation: Secondary | ICD-10-CM

## 2012-03-09 DIAGNOSIS — I639 Cerebral infarction, unspecified: Secondary | ICD-10-CM

## 2012-03-09 DIAGNOSIS — I4891 Unspecified atrial fibrillation: Secondary | ICD-10-CM

## 2012-03-09 DIAGNOSIS — I6350 Cerebral infarction due to unspecified occlusion or stenosis of unspecified cerebral artery: Secondary | ICD-10-CM

## 2012-03-09 DIAGNOSIS — Z7901 Long term (current) use of anticoagulants: Secondary | ICD-10-CM

## 2012-03-09 LAB — POCT INR: INR: 1.9

## 2012-03-14 ENCOUNTER — Encounter: Payer: Self-pay | Admitting: Family Medicine

## 2012-03-14 ENCOUNTER — Ambulatory Visit (INDEPENDENT_AMBULATORY_CARE_PROVIDER_SITE_OTHER): Payer: PRIVATE HEALTH INSURANCE | Admitting: Family Medicine

## 2012-03-14 VITALS — BP 150/74 | HR 83 | Resp 15 | Ht 63.0 in | Wt 129.1 lb

## 2012-03-14 DIAGNOSIS — E785 Hyperlipidemia, unspecified: Secondary | ICD-10-CM

## 2012-03-14 DIAGNOSIS — D509 Iron deficiency anemia, unspecified: Secondary | ICD-10-CM

## 2012-03-14 DIAGNOSIS — I10 Essential (primary) hypertension: Secondary | ICD-10-CM

## 2012-03-14 DIAGNOSIS — G8190 Hemiplegia, unspecified affecting unspecified side: Secondary | ICD-10-CM

## 2012-03-14 DIAGNOSIS — H9190 Unspecified hearing loss, unspecified ear: Secondary | ICD-10-CM

## 2012-03-14 LAB — BASIC METABOLIC PANEL
CO2: 28 mEq/L (ref 19–32)
Chloride: 100 mEq/L (ref 96–112)
Creat: 1.48 mg/dL — ABNORMAL HIGH (ref 0.50–1.10)
Potassium: 4.9 mEq/L (ref 3.5–5.3)
Sodium: 138 mEq/L (ref 135–145)

## 2012-03-14 LAB — CBC WITH DIFFERENTIAL/PLATELET
Basophils Absolute: 0.1 10*3/uL (ref 0.0–0.1)
Lymphocytes Relative: 36 % (ref 12–46)
Lymphs Abs: 1.4 10*3/uL (ref 0.7–4.0)
MCV: 88.9 fL (ref 78.0–100.0)
Neutro Abs: 2 10*3/uL (ref 1.7–7.7)
Platelets: 225 10*3/uL (ref 150–400)
RBC: 3.69 MIL/uL — ABNORMAL LOW (ref 3.87–5.11)
RDW: 13.7 % (ref 11.5–15.5)
WBC: 3.9 10*3/uL — ABNORMAL LOW (ref 4.0–10.5)

## 2012-03-14 NOTE — Progress Notes (Signed)
  Subjective:    Patient ID: Judy Grant, female    DOB: 09-09-1917, 78 y.o.   MRN: [id number]  HPI Pt is in after requiring hospitalization followed by skilled nursing care following a stroke, which caused her to be hemiparetic. She is clearly not back to her baseline , but i am sure she has made great strides in a positive direction since the event. She is working on improving her appetite, she has lost weight. Balance and strength are also being addressed, they continue to be a work in [employer] generalized pain esp in the knees C/o hearing loss and wants ENT eval  Review of Systems See HPI Denies recent fever or chills. Denies sinus pressure, nasal congestion, ear pain or sore throat. Denies chest congestion, productive cough or wheezing. Denies chest pains, palpitations and leg swelling Denies abdominal pain, nausea, vomiting,diarrhea or constipation.   Denies dysuria, frequency, hesitancy or incontinence.  Denies headaches, seizures, numbness, or tingling. Denies depression, anxiety or insomnia. Denies skin break down or rash.        Objective:   Physical Exam  Patient alert and oriented and in no cardiopulmonary distress.  HEENT: No facial asymmetry, EOMI, no sinus tenderness,  oropharynx pink and moist.  Neck decreaserOM no adenopathy.  Chest: Clear to auscultation bilaterally.  CVS: S1, S2 systolic murmur, no S3  ABD: Soft non tender. Bowel sounds normal.  Ext: No edema  MS: decreased ROM spine, shoulders, hips and knees.  Skin: Intact, no ulcerations or rash noted.  Psych: Good eye contact, normal affect. Memory intact not anxious or depressed appearing.  CNS: CN 2-12 intact, power, tone and sensation decreased in right upper and lower extremities. Speech not as clear asin the past      Assessment & Plan:

## 2012-03-14 NOTE — Patient Instructions (Addendum)
F/U in 3 month, please call if you need me before  You need to use stool softener, 2 twice daily, Colace . Eat a lot of fruit , eat all bran cereal or shredded wheat every day to help with the stool  Use bengay or asper creme for knee pain  CBC and chem 7 non fasting needed as soon as possible  You are referred to ENT for hearing evaluation

## 2012-03-27 ENCOUNTER — Other Ambulatory Visit: Payer: Self-pay | Admitting: Adult Health

## 2012-03-27 ENCOUNTER — Other Ambulatory Visit: Payer: Self-pay | Admitting: Family Medicine

## 2012-03-29 ENCOUNTER — Ambulatory Visit (INDEPENDENT_AMBULATORY_CARE_PROVIDER_SITE_OTHER): Payer: PRIVATE HEALTH INSURANCE | Admitting: Otolaryngology

## 2012-04-02 ENCOUNTER — Telehealth: Payer: Self-pay | Admitting: Adult Health

## 2012-04-02 ENCOUNTER — Ambulatory Visit (INDEPENDENT_AMBULATORY_CARE_PROVIDER_SITE_OTHER): Payer: PRIVATE HEALTH INSURANCE | Admitting: *Deleted

## 2012-04-02 DIAGNOSIS — I4891 Unspecified atrial fibrillation: Secondary | ICD-10-CM

## 2012-04-02 DIAGNOSIS — I6350 Cerebral infarction due to unspecified occlusion or stenosis of unspecified cerebral artery: Secondary | ICD-10-CM

## 2012-04-02 DIAGNOSIS — Z7901 Long term (current) use of anticoagulants: Secondary | ICD-10-CM

## 2012-04-02 DIAGNOSIS — I480 Paroxysmal atrial fibrillation: Secondary | ICD-10-CM

## 2012-04-02 DIAGNOSIS — I639 Cerebral infarction, unspecified: Secondary | ICD-10-CM

## 2012-04-02 MED ORDER — WARFARIN SODIUM 1 MG PO TABS: 1.0000 mg | ORAL_TABLET | ORAL | Status: DC

## 2012-04-02 MED ORDER — WARFARIN SODIUM 2.5 MG PO TABS: 2.5000 mg | ORAL_TABLET | Freq: Every day | ORAL | Status: DC

## 2012-04-02 NOTE — Telephone Encounter (Signed)
Pt needs new rx called in for warfrin, please ask them to deliver

## 2012-04-02 NOTE — Telephone Encounter (Signed)
Called pharmacy and gave verbal order based on pt coumadin dosage TODAY take 3.5mg  daily thus take a one mg tablet and a 2.5mg  daily, gave verbal for #45 with 3 refills, pharmacy rep Genelle Bal will have RX delivered to pt home

## 2012-04-08 NOTE — Assessment & Plan Note (Signed)
C/o deterioation in hearing and requests referral for eval and management

## 2012-04-08 NOTE — Assessment & Plan Note (Signed)
Improving gradually since initial presentation, residual weakness and speech disturbance still present

## 2012-04-08 NOTE — Assessment & Plan Note (Signed)
Systolic elevated however, no change in medication

## 2012-04-08 NOTE — Assessment & Plan Note (Signed)
Controlled, no change in medication Hyperlipidemia:Low fat diet discussed and encouraged.  \ 

## 2012-04-19 ENCOUNTER — Other Ambulatory Visit: Payer: Self-pay | Admitting: Family Medicine

## 2012-04-19 ENCOUNTER — Ambulatory Visit (INDEPENDENT_AMBULATORY_CARE_PROVIDER_SITE_OTHER): Payer: Medicaid Other | Admitting: Otolaryngology

## 2012-04-19 DIAGNOSIS — H903 Sensorineural hearing loss, bilateral: Secondary | ICD-10-CM

## 2012-05-02 ENCOUNTER — Ambulatory Visit (INDEPENDENT_AMBULATORY_CARE_PROVIDER_SITE_OTHER): Payer: PRIVATE HEALTH INSURANCE | Admitting: *Deleted

## 2012-05-02 DIAGNOSIS — I639 Cerebral infarction, unspecified: Secondary | ICD-10-CM

## 2012-05-02 DIAGNOSIS — I4891 Unspecified atrial fibrillation: Secondary | ICD-10-CM

## 2012-05-02 DIAGNOSIS — I6350 Cerebral infarction due to unspecified occlusion or stenosis of unspecified cerebral artery: Secondary | ICD-10-CM

## 2012-05-02 DIAGNOSIS — I480 Paroxysmal atrial fibrillation: Secondary | ICD-10-CM

## 2012-05-02 DIAGNOSIS — Z7901 Long term (current) use of anticoagulants: Secondary | ICD-10-CM

## 2012-05-02 LAB — POCT INR: INR: 2.1

## 2012-05-14 ENCOUNTER — Telehealth: Payer: Self-pay | Admitting: Family Medicine

## 2012-05-14 MED ORDER — TRAMADOL HCL 50 MG PO TABS: 50.0000 mg | ORAL_TABLET | Freq: Four times a day (QID) | ORAL | Status: DC | PRN

## 2012-05-14 NOTE — Telephone Encounter (Signed)
Med sent.

## 2012-05-17 ENCOUNTER — Other Ambulatory Visit: Payer: Self-pay | Admitting: Family Medicine

## 2012-05-29 ENCOUNTER — Other Ambulatory Visit: Payer: Self-pay | Admitting: Family Medicine

## 2012-05-30 ENCOUNTER — Ambulatory Visit (INDEPENDENT_AMBULATORY_CARE_PROVIDER_SITE_OTHER): Payer: PRIVATE HEALTH INSURANCE | Admitting: *Deleted

## 2012-05-30 DIAGNOSIS — I4891 Unspecified atrial fibrillation: Secondary | ICD-10-CM

## 2012-05-30 LAB — POCT INR: INR: 2.1

## 2012-06-04 ENCOUNTER — Other Ambulatory Visit: Payer: Self-pay | Admitting: Family Medicine

## 2012-06-20 ENCOUNTER — Other Ambulatory Visit: Payer: Self-pay | Admitting: Family Medicine

## 2012-06-21 ENCOUNTER — Telehealth: Payer: Self-pay | Admitting: *Deleted

## 2012-06-21 MED ORDER — WARFARIN SODIUM 1 MG PO TABS: 1.0000 mg | ORAL_TABLET | ORAL | Status: DC

## 2012-06-21 MED ORDER — WARFARIN SODIUM 2.5 MG PO TABS: 2.5000 mg | ORAL_TABLET | Freq: Every day | ORAL | Status: DC

## 2012-06-21 NOTE — Telephone Encounter (Signed)
Noted incoming fax for warafarin 2.5mg  tablets and 1mg  tablets, noted pt is up to date on coumadin OV however overdue for f/u with [facility], pt has now been scheduled with [facility] on 06-27-12 at 3pm with [facility] via TJ. Pt accepted apt, sent refills for warafarin via escribe to [facility]

## 2012-06-25 ENCOUNTER — Other Ambulatory Visit: Payer: Self-pay | Admitting: Family Medicine

## 2012-06-27 ENCOUNTER — Ambulatory Visit: Payer: PRIVATE HEALTH INSURANCE | Admitting: Adult Health

## 2012-06-27 ENCOUNTER — Ambulatory Visit (INDEPENDENT_AMBULATORY_CARE_PROVIDER_SITE_OTHER): Payer: PRIVATE HEALTH INSURANCE | Admitting: *Deleted

## 2012-06-27 DIAGNOSIS — I639 Cerebral infarction, unspecified: Secondary | ICD-10-CM

## 2012-06-27 DIAGNOSIS — I480 Paroxysmal atrial fibrillation: Secondary | ICD-10-CM

## 2012-06-27 DIAGNOSIS — I6350 Cerebral infarction due to unspecified occlusion or stenosis of unspecified cerebral artery: Secondary | ICD-10-CM

## 2012-06-27 DIAGNOSIS — I4891 Unspecified atrial fibrillation: Secondary | ICD-10-CM

## 2012-06-27 DIAGNOSIS — Z7901 Long term (current) use of anticoagulants: Secondary | ICD-10-CM

## 2012-06-27 LAB — POCT INR: INR: 1.8

## 2012-07-06 ENCOUNTER — Ambulatory Visit (INDEPENDENT_AMBULATORY_CARE_PROVIDER_SITE_OTHER): Payer: PRIVATE HEALTH INSURANCE | Admitting: Adult Health

## 2012-07-06 ENCOUNTER — Encounter: Payer: Self-pay | Admitting: Adult Health

## 2012-07-06 VITALS — BP 118/70 | HR 68 | Wt 136.0 lb

## 2012-07-06 DIAGNOSIS — I350 Nonrheumatic aortic (valve) stenosis: Secondary | ICD-10-CM

## 2012-07-06 DIAGNOSIS — I359 Nonrheumatic aortic valve disorder, unspecified: Secondary | ICD-10-CM

## 2012-07-06 DIAGNOSIS — I4891 Unspecified atrial fibrillation: Secondary | ICD-10-CM

## 2012-07-06 DIAGNOSIS — I10 Essential (primary) hypertension: Secondary | ICD-10-CM

## 2012-07-06 NOTE — Assessment & Plan Note (Signed)
She is asymptomatic for chest pain or dyspnea on exertion. She is very sedentary secondary to history of CVA using a wheelchair for ambulation. No plans for repeat echo at this time, she would be a poor candidate for aortic valve replacement or repair secondary to age and comorbidities.

## 2012-07-06 NOTE — Assessment & Plan Note (Signed)
This is become chronic for her. Heart rate is well-controlled currently on metoprolol 50 mg one half tablets twice a day. She will continue in our [place] office Coumadin clinic. No changes to current medication regimen.

## 2012-07-06 NOTE — Progress Notes (Signed)
HPI Judy Grant is a [age]-year-old patient of Dr. Dietrich Pates We follow  for ongoing assessment treatment of aortic valve disease, rheumatic in origin, without cardiopulmonary symptoms. She also has a history of atrial  fibrillation, hypertension, hyperlipidemia, iron deficiency anemia. The patient was last seen in August of 2013 with complaints of neck pain. Cervical spine x-rays revealed cervical spine degenerative changes, including C2-C3, C3- C4, and C6-C7 with mild multilevel spondylolisthesis.he was referred to her primary care physician for ongoing management and pain control  She offers no new complaints. She is medically compliant. Has a caretaker in her home from 8-5p.     No Known Allergies  Current Outpatient Prescriptions  Medication Sig Dispense Refill  . alendronate (FOSAMAX) 70 MG tablet TAKE 1 TAB EACH WEEK 30 MIN PRIOR TO BREAKFAST WITH LARGE GLASS OF WATER. REMAIN UPRIGHT.  4 tablet  3  . allopurinol (ZYLOPRIM) 100 MG tablet Take 1 tablet (100 mg total) by mouth daily.  30 tablet  2  . allopurinol (ZYLOPRIM) 100 MG tablet TAKE ONE TABLET BY MOUTH ONCE DAILY.  30 tablet  2  . Calcium Carb-Cholecalciferol 500-400 MG-UNIT TABS TAKE (1) TABLET BY MOUTH (3) TIMES DAILY.  30 tablet  3  . COLACE 50 MG capsule TAKE (1) CAPSULE BY MOUTH TWICE DAILY.  60 capsule  2  . DIOVAN 80 MG tablet TAKE ONE TABLET BY MOUTH ONCE DAILY.  30 tablet  3  . FLONASE 50 MCG/ACT nasal spray USE 1 SPRAY IN EACH NOSTRIL ONCE DAILY.  16 g  1  . furosemide (LASIX) 40 MG tablet TAKE 1 TABLET BY MOUTH ONCE DAILY.  30 tablet  3  . LOPRESSOR 50 MG tablet TAKE 1& 1/2 TABLETS EACH MORNING.  75 tablet  3  . polyethylene glycol (MIRALAX / GLYCOLAX) packet Take 17 g by mouth daily.  30 each  2  . potassium chloride (K-DUR) 10 MEQ tablet TAKE (1) TABLET BY MOUTH ON TUESDAYS, THURSDAYS, SATURDAYS, AND SUNDAYS.  45 tablet  3  . potassium chloride (K-DUR) 10 MEQ tablet TAKE 2 TABLETS BY MOUTH ON MONDAYS, WEDNESDAYS, AND  FRIDAYS.  45 tablet  3  . QC LO-DOSE ASPIRIN 81 MG EC tablet TAKE ONE TABLET BY MOUTH ONCE DAILY.  30 tablet  2  . traMADol (ULTRAM) 50 MG tablet TAKE 1 TABLET BY MOUTH EVERY 6 HOURS AS NEEDED FOR PAIN.  30 tablet  3  . warfarin (COUMADIN) 1 MG tablet Take 1 tablet (1 mg total) by mouth as directed.  45 tablet  3  . warfarin (COUMADIN) 2.5 MG tablet Take 1 tablet (2.5 mg total) by mouth daily.  45 tablet  3  . ZOCOR 20 MG tablet TAKE (1) TABLET BY MOUTH AT BEDTIME FOR CHOLESTEROL.  30 tablet  3   No current facility-administered medications for this visit.    Past Medical History  Diagnosis Date  . Hyperlipidemia   . Osteoporosis   . Hypertension     with severe left ventricular hypertrophy; normal ejection fraction in 04/2005  . Valvular heart disease     Mild to moderate aortic stenosis; moderate to severe mitral stenosis in 2007; mild MR  . Paroxysmal atrial fibrillation     Remote  . Diverticulosis     Pancolonic; h/o LGI bleeding and diverticulitis  . Degenerative joint disease     Knees  . Chronic kidney disease     Mild; creatinine of 1.19-1.28 in recent years  . Anemia, iron deficiency   .  Popliteal cyst     Left  . Fracture of wrist 2008    left  . Gout   . Stroke     Past Surgical History  Procedure Laterality Date  . Appendectomy  1944  . Total hip arthroplasty  1994    HYQ:MVHQIO of systems complete and found to be negative unless listed above  PHYSICAL EXAM BP 118/70  Pulse 68  Wt 136 lb (61.689 kg)  BMI 24.1 kg/m2 General: Well developed, well nourished, in no acute distress Head: Eyes PERRLA, No xanthomas.   Normal cephalic and atramatic  Lungs: Clear bilaterally to auscultation and percussion. Heart: HRRR S1 S2, 2/6 holosystolic murmur. .  Pulses are 2+ & equal.            No carotid bruit. No JVD.  No abdominal bruits. No femoral bruits. Abdomen: Bowel sounds are positive, abdomen soft and non-tender without masses or                  Hernia's  noted. Msk:  Back normal, wheelchair bound. Overall diminished strength and tone for age. Extremities: No clubbing, cyanosis or edema.  DP +1 Neuro: Alert and oriented X 3. Psych:  Good affect, responds appropriately  NGE:XBMWUX fibrillation rate of 68 bpm.  ASSESSMENT AND PLAN

## 2012-07-06 NOTE — Patient Instructions (Addendum)
Your physician recommends that you schedule a follow-up appointment in: 6 months  

## 2012-07-06 NOTE — Progress Notes (Deleted)
Name: Judy Grant    DOB: 1918-01-22  Age: 79 y.o.  MR#: [id number]       PCP:  [name], MD      Insurance: Payor: Cleatrice Burke MEDICARE  Plan: [facility]  Product Type: *No Product type*    CC:    Chief Complaint  Patient presents with  . Hypertension  . Cardiac Valve Problem    VS Filed Vitals:   07/06/12 1258  BP: 118/70  Pulse: 68  Weight: 136 lb (61.689 kg)    Weights Current Weight  07/06/12 136 lb (61.689 kg)  03/14/12 129 lb 1.9 oz (58.568 kg)  01/10/12 131 lb 6.3 oz (59.6 kg)    Blood Pressure  BP Readings from Last 3 Encounters:  07/06/12 118/70  03/14/12 150/74  01/23/12 151/93     Admit date:  (Not on file) Last encounter with RMR:  06/27/2012   Allergy Review of patient's allergies indicates no known allergies.  Current Outpatient Prescriptions  Medication Sig Dispense Refill  . alendronate (FOSAMAX) 70 MG tablet TAKE 1 TAB EACH WEEK 30 MIN PRIOR TO BREAKFAST WITH LARGE GLASS OF WATER. REMAIN UPRIGHT.  4 tablet  3  . allopurinol (ZYLOPRIM) 100 MG tablet Take 1 tablet (100 mg total) by mouth daily.  30 tablet  2  . allopurinol (ZYLOPRIM) 100 MG tablet TAKE ONE TABLET BY MOUTH ONCE DAILY.  30 tablet  2  . Calcium Carb-Cholecalciferol 500-400 MG-UNIT TABS TAKE (1) TABLET BY MOUTH (3) TIMES DAILY.  30 tablet  3  . COLACE 50 MG capsule TAKE (1) CAPSULE BY MOUTH TWICE DAILY.  60 capsule  2  . DIOVAN 80 MG tablet TAKE ONE TABLET BY MOUTH ONCE DAILY.  30 tablet  3  . FLONASE 50 MCG/ACT nasal spray USE 1 SPRAY IN EACH NOSTRIL ONCE DAILY.  16 g  1  . furosemide (LASIX) 40 MG tablet TAKE 1 TABLET BY MOUTH ONCE DAILY.  30 tablet  3  . LOPRESSOR 50 MG tablet TAKE 1& 1/2 TABLETS EACH MORNING.  75 tablet  3  . polyethylene glycol (MIRALAX / GLYCOLAX) packet Take 17 g by mouth daily.  30 each  2  . potassium chloride (K-DUR) 10 MEQ tablet TAKE (1) TABLET BY MOUTH ON TUESDAYS, THURSDAYS, SATURDAYS, AND SUNDAYS.  45 tablet  3  . potassium chloride (K-DUR)  10 MEQ tablet TAKE 2 TABLETS BY MOUTH ON MONDAYS, WEDNESDAYS, AND FRIDAYS.  45 tablet  3  . QC LO-DOSE ASPIRIN 81 MG EC tablet TAKE ONE TABLET BY MOUTH ONCE DAILY.  30 tablet  2  . traMADol (ULTRAM) 50 MG tablet TAKE 1 TABLET BY MOUTH EVERY 6 HOURS AS NEEDED FOR PAIN.  30 tablet  3  . warfarin (COUMADIN) 1 MG tablet Take 1 tablet (1 mg total) by mouth as directed.  45 tablet  3  . warfarin (COUMADIN) 2.5 MG tablet Take 1 tablet (2.5 mg total) by mouth daily.  45 tablet  3  . ZOCOR 20 MG tablet TAKE (1) TABLET BY MOUTH AT BEDTIME FOR CHOLESTEROL.  30 tablet  3   No current facility-administered medications for this visit.    Discontinued Meds:   There are no discontinued medications.  Patient Active Problem List  Diagnosis  . ANEMIA-IRON DEFICIENCY  . ARTHRITIS, KNEES, BILATERAL  . Osteoporosis  . Hyperlipidemia  . Hypertension  . Diverticulosis  . Paroxysmal atrial fibrillation  . Chronic kidney disease  . Ankle fracture, left  . NSTEMI, initial episode of  care  . CVA (cerebral infarction)  . Moderate aortic stenosis  . Moderate mitral stenosis  . New onset a-fib  . CKD (chronic kidney disease) stage 3, GFR 30-59 ml/min  . Swelling of joint of right shoulder  . Hemiplegia, unspecified, affecting dominant side  . Long term (current) use of anticoagulants  . Hearing loss    LABS    Component Value Date/Time   NA 138 03/14/2012 1134   NA 134* 01/23/2012 0537   NA 138 01/08/2012 0718   K 4.9 03/14/2012 1134   K 5.2* 01/23/2012 0537   K 4.9 01/08/2012 0718   CL 100 03/14/2012 1134   CL 98 01/23/2012 0537   CL 106 01/08/2012 0718   CO2 28 03/14/2012 1134   CO2 26 01/23/2012 0537   CO2 25 01/08/2012 0718   GLUCOSE 97 03/14/2012 1134   GLUCOSE 131* 01/23/2012 0537   GLUCOSE 87 01/08/2012 0718   BUN 29* 03/14/2012 1134   BUN 28* 01/23/2012 0537   BUN 19 01/08/2012 0718   CREATININE 1.48* 03/14/2012 1134   CREATININE 1.38* 01/23/2012 0537   CREATININE 1.32* 01/08/2012  0718   CREATININE 1.21* 01/06/2012 0508   CREATININE 1.29* 11/11/2011 1058   CREATININE 1.90* 09/23/2011 1020   CALCIUM 11.1* 03/14/2012 1134   CALCIUM 10.6* 01/23/2012 0537   CALCIUM 9.7 01/08/2012 0718   GFRNONAA 32* 01/23/2012 0537   GFRNONAA 34* 01/08/2012 0718   GFRNONAA 38* 01/06/2012 0508   GFRAA 37* 01/23/2012 0537   GFRAA 39* 01/08/2012 0718   GFRAA 44* 01/06/2012 0508   CMP     Component Value Date/Time   NA 138 03/14/2012 1134   K 4.9 03/14/2012 1134   CL 100 03/14/2012 1134   CO2 28 03/14/2012 1134   GLUCOSE 97 03/14/2012 1134   BUN 29* 03/14/2012 1134   CREATININE 1.48* 03/14/2012 1134   CREATININE 1.38* 01/23/2012 0537   CALCIUM 11.1* 03/14/2012 1134   PROT 7.4 01/23/2012 0537   ALBUMIN 3.6 01/23/2012 0537   AST 20 01/23/2012 0537   ALT 15 01/23/2012 0537   ALKPHOS 53 01/23/2012 0537   BILITOT 0.3 01/23/2012 0537   GFRNONAA 32* 01/23/2012 0537   GFRAA 37* 01/23/2012 0537       Component Value Date/Time   WBC 3.9* 03/14/2012 1134   WBC 5.7 01/23/2012 0537   WBC 5.1 01/08/2012 0718   HGB 11.1* 03/14/2012 1134   HGB 10.3* 01/23/2012 0537   HGB 9.5* 01/08/2012 0718   HCT 32.8* 03/14/2012 1134   HCT 32.4* 01/23/2012 0537   HCT 30.3* 01/08/2012 0718   MCV 88.9 03/14/2012 1134   MCV 94.2 01/23/2012 0537   MCV 92.1 01/08/2012 0718    Lipid Panel     Component Value Date/Time   CHOL 138 01/06/2012 0508   TRIG 57 01/06/2012 0508   HDL 67 01/06/2012 0508   CHOLHDL 2.1 01/06/2012 0508   VLDL 11 01/06/2012 0508   LDLCALC 60 01/06/2012 0508    ABG No results found for this basename: phart, pco2, pco2art, po2, po2art, hco3, tco2, acidbasedef, o2sat     Lab Results  Component Value Date   TSH 0.831 09/23/2011   BNP (last 3 results) No results found for this basename: PROBNP,  in the last 8760 hours Cardiac Panel (last 3 results) No results found for this basename: CKTOTAL, CKMB, TROPONINI, RELINDX,  in the last 72 hours  Iron/TIBC/Ferritin     Component Value Date/Time   IRON 116 11/09/2009 0922  EKG Orders placed in visit on 07/06/12  . EKG 12-LEAD     Prior Assessment and Plan Problem List as of 07/06/2012     ICD-9-CM     Cardiology Problems   Hyperlipidemia   Last Assessment & Plan   03/14/2012 Office Visit Written 04/08/2012  2:39 PM by Kerri Perches, MD     Controlled, no change in medication Hyperlipidemia:Low fat diet discussed and encouraged.      Hypertension   Last Assessment & Plan   03/14/2012 Office Visit Written 04/08/2012  2:42 PM by Kerri Perches, MD     Systolic elevated however, no change in medication     Paroxysmal atrial fibrillation   NSTEMI, initial episode of care   Moderate aortic stenosis   Moderate mitral stenosis   New onset a-fib     Other   ANEMIA-IRON DEFICIENCY   Last Assessment & Plan   09/05/2011 Office Visit Written 09/05/2011 12:08 PM by Kathlen Brunswick, MD     Patient has had a long-standing mild to moderate anemia, the cause of which is undiagnosed as far as I know.  CBC will be repeated.    ARTHRITIS, KNEES, BILATERAL   Last Assessment & Plan   12/07/2011 Office Visit Written 12/07/2011  7:54 PM by Kerri Perches, MD     Severe, currently in a wheelchair    Osteoporosis   Last Assessment & Plan   11/21/2011 Office Visit Written 11/21/2011  3:07 PM by Jodelle Gross, NP     She is having significant neck and shoulder pain on the left. She winces as she speaks to me in turns her head during our conversation. She has been taking a lot of BC powders for the pain. I have advised her against this and have her take her tramadol as directed by Dr. Lodema Hong, will have her sent to x-ray for cervical spine to evaluate for bone spurs, significant arthritis, or other etiology of this pain. She is to see Dr. Lodema Hong for further recommendations once the films are read. She is started on Motrin 600 mg nightly hours when necessary pain have also advised her to buy an  over-the-counter Thermacare warm compress which may be helpful for her overall discomfort. We will defer to Dr. Lodema Hong for any further treatment recommendations.    Diverticulosis   Chronic kidney disease   Ankle fracture, left   Last Assessment & Plan   12/07/2011 Office Visit Written 12/07/2011  7:51 PM by Kerri Perches, MD     Recent trauma to ankle referred to orthopedics    CVA (cerebral infarction)   CKD (chronic kidney disease) stage 3, GFR 30-59 ml/min   Swelling of joint of right shoulder   Hemiplegia, unspecified, affecting dominant side   Last Assessment & Plan   03/14/2012 Office Visit Written 04/08/2012  2:38 PM by Kerri Perches, MD     Improving gradually since initial presentation, residual weakness and speech disturbance still present    Long term (current) use of anticoagulants   Hearing loss   Last Assessment & Plan   03/14/2012 Office Visit Written 04/08/2012  2:42 PM by Kerri Perches, MD     C/o deterioation in hearing and requests referral for eval and management        Imaging: No results found.

## 2012-07-06 NOTE — Assessment & Plan Note (Signed)
Blood pressure is very well-controlled on current medication regimen. Her current doses. We will followup with her in 6 months unless she becomes symptomatic

## 2012-07-23 ENCOUNTER — Ambulatory Visit (INDEPENDENT_AMBULATORY_CARE_PROVIDER_SITE_OTHER): Payer: PRIVATE HEALTH INSURANCE | Admitting: *Deleted

## 2012-07-23 ENCOUNTER — Other Ambulatory Visit: Payer: Self-pay | Admitting: Cardiology

## 2012-07-23 DIAGNOSIS — Z7901 Long term (current) use of anticoagulants: Secondary | ICD-10-CM

## 2012-07-23 DIAGNOSIS — I4891 Unspecified atrial fibrillation: Secondary | ICD-10-CM

## 2012-07-23 DIAGNOSIS — I6350 Cerebral infarction due to unspecified occlusion or stenosis of unspecified cerebral artery: Secondary | ICD-10-CM

## 2012-07-23 DIAGNOSIS — I480 Paroxysmal atrial fibrillation: Secondary | ICD-10-CM

## 2012-07-23 DIAGNOSIS — I639 Cerebral infarction, unspecified: Secondary | ICD-10-CM

## 2012-07-23 LAB — POCT INR: INR: 1.7

## 2012-07-24 ENCOUNTER — Telehealth: Payer: Self-pay | Admitting: *Deleted

## 2012-07-24 MED ORDER — WARFARIN SODIUM 1 MG PO TABS: 1.0000 mg | ORAL_TABLET | Freq: Every day | ORAL | Status: DC

## 2012-07-24 MED ORDER — WARFARIN SODIUM 2.5 MG PO TABS: 2.5000 mg | ORAL_TABLET | ORAL | Status: DC

## 2012-07-24 NOTE — Telephone Encounter (Signed)
received incoming call from pt pharmacy to advise pt needs a new RX for her coumadin sent in, advised this will be sent in for the pt today, sent via escribe

## 2012-07-31 ENCOUNTER — Other Ambulatory Visit: Payer: Self-pay | Admitting: Family Medicine

## 2012-07-31 ENCOUNTER — Other Ambulatory Visit: Payer: Self-pay | Admitting: Cardiology

## 2012-07-31 NOTE — Telephone Encounter (Signed)
Fax Received. Refill Completed. Saranne Crislip Chowoe (R.M.A)   

## 2012-08-13 ENCOUNTER — Ambulatory Visit (INDEPENDENT_AMBULATORY_CARE_PROVIDER_SITE_OTHER): Payer: PRIVATE HEALTH INSURANCE | Admitting: *Deleted

## 2012-08-13 DIAGNOSIS — I4891 Unspecified atrial fibrillation: Secondary | ICD-10-CM

## 2012-08-13 DIAGNOSIS — Z7901 Long term (current) use of anticoagulants: Secondary | ICD-10-CM

## 2012-08-13 DIAGNOSIS — I639 Cerebral infarction, unspecified: Secondary | ICD-10-CM

## 2012-08-13 DIAGNOSIS — I480 Paroxysmal atrial fibrillation: Secondary | ICD-10-CM

## 2012-08-13 DIAGNOSIS — I6350 Cerebral infarction due to unspecified occlusion or stenosis of unspecified cerebral artery: Secondary | ICD-10-CM

## 2012-08-13 LAB — POCT INR: INR: 3.9

## 2012-08-17 ENCOUNTER — Other Ambulatory Visit: Payer: Self-pay | Admitting: Cardiology

## 2012-08-17 ENCOUNTER — Other Ambulatory Visit: Payer: Self-pay | Admitting: Family Medicine

## 2012-08-27 ENCOUNTER — Other Ambulatory Visit: Payer: Self-pay | Admitting: Family Medicine

## 2012-09-03 ENCOUNTER — Ambulatory Visit (INDEPENDENT_AMBULATORY_CARE_PROVIDER_SITE_OTHER): Payer: PRIVATE HEALTH INSURANCE | Admitting: *Deleted

## 2012-09-03 DIAGNOSIS — I4891 Unspecified atrial fibrillation: Secondary | ICD-10-CM

## 2012-09-03 DIAGNOSIS — Z7901 Long term (current) use of anticoagulants: Secondary | ICD-10-CM

## 2012-09-03 DIAGNOSIS — I639 Cerebral infarction, unspecified: Secondary | ICD-10-CM

## 2012-09-03 DIAGNOSIS — I480 Paroxysmal atrial fibrillation: Secondary | ICD-10-CM

## 2012-09-03 DIAGNOSIS — I6350 Cerebral infarction due to unspecified occlusion or stenosis of unspecified cerebral artery: Secondary | ICD-10-CM

## 2012-09-03 LAB — POCT INR: INR: 2.1

## 2012-09-10 ENCOUNTER — Telehealth: Payer: Self-pay | Admitting: Family Medicine

## 2012-09-10 ENCOUNTER — Other Ambulatory Visit: Payer: Self-pay | Admitting: Family Medicine

## 2012-09-11 ENCOUNTER — Telehealth: Payer: Self-pay | Admitting: Family Medicine

## 2012-09-11 NOTE — Telephone Encounter (Signed)
Faxed in

## 2012-09-11 NOTE — Telephone Encounter (Signed)
Script written pls fax

## 2012-09-12 NOTE — Telephone Encounter (Signed)
Script faxed.

## 2012-09-19 ENCOUNTER — Other Ambulatory Visit: Payer: Self-pay | Admitting: Family Medicine

## 2012-10-01 ENCOUNTER — Ambulatory Visit (INDEPENDENT_AMBULATORY_CARE_PROVIDER_SITE_OTHER): Payer: PRIVATE HEALTH INSURANCE | Admitting: *Deleted

## 2012-10-01 DIAGNOSIS — I639 Cerebral infarction, unspecified: Secondary | ICD-10-CM

## 2012-10-01 DIAGNOSIS — I4891 Unspecified atrial fibrillation: Secondary | ICD-10-CM

## 2012-10-01 DIAGNOSIS — I480 Paroxysmal atrial fibrillation: Secondary | ICD-10-CM

## 2012-10-01 DIAGNOSIS — Z7901 Long term (current) use of anticoagulants: Secondary | ICD-10-CM

## 2012-10-01 DIAGNOSIS — I6350 Cerebral infarction due to unspecified occlusion or stenosis of unspecified cerebral artery: Secondary | ICD-10-CM

## 2012-10-01 LAB — POCT INR: INR: 2.8

## 2012-10-02 ENCOUNTER — Telehealth: Payer: Self-pay | Admitting: Family Medicine

## 2012-10-02 NOTE — Telephone Encounter (Signed)
I believe patient is referring to Tramadol.  Can this be increased without ov?

## 2012-10-02 NOTE — Telephone Encounter (Signed)
July 15 and 17 have a lot of availability, please schedule an appt for her on one of those days, and ask that she bring all meds. There is no appt in the system here for hr, she need one

## 2012-10-09 ENCOUNTER — Encounter: Payer: Self-pay | Admitting: Family Medicine

## 2012-10-09 ENCOUNTER — Ambulatory Visit (INDEPENDENT_AMBULATORY_CARE_PROVIDER_SITE_OTHER): Payer: PRIVATE HEALTH INSURANCE | Admitting: Family Medicine

## 2012-10-09 VITALS — BP 120/72 | HR 73 | Resp 16 | Ht 63.0 in | Wt 139.0 lb

## 2012-10-09 DIAGNOSIS — M810 Age-related osteoporosis without current pathological fracture: Secondary | ICD-10-CM

## 2012-10-09 DIAGNOSIS — E785 Hyperlipidemia, unspecified: Secondary | ICD-10-CM

## 2012-10-09 DIAGNOSIS — M129 Arthropathy, unspecified: Secondary | ICD-10-CM

## 2012-10-09 DIAGNOSIS — I6350 Cerebral infarction due to unspecified occlusion or stenosis of unspecified cerebral artery: Secondary | ICD-10-CM

## 2012-10-09 DIAGNOSIS — I639 Cerebral infarction, unspecified: Secondary | ICD-10-CM

## 2012-10-09 DIAGNOSIS — I10 Essential (primary) hypertension: Secondary | ICD-10-CM

## 2012-10-09 DIAGNOSIS — Z9181 History of falling: Secondary | ICD-10-CM | POA: Insufficient documentation

## 2012-10-09 MED ORDER — TRAMADOL HCL 50 MG PO TABS: ORAL_TABLET | ORAL | Status: DC

## 2012-10-09 NOTE — Patient Instructions (Addendum)
F/U in early November, pls call if you need me before  We are requestuing an additional 2 hours per day of help at home for you, you certainly DO need this.  Pain medication is increased starting today , to one twice daily  Prescription is sent to [facility] for recliner chair as we discussed  Labs today, CBC, lipid, cmp, TSH, and vit D ( high fall risk)

## 2012-10-10 LAB — CBC
Hemoglobin: 10.5 g/dL — ABNORMAL LOW (ref 12.0–15.0)
MCHC: 34.3 g/dL (ref 30.0–36.0)
RDW: 14.2 % (ref 11.5–15.5)

## 2012-10-10 LAB — COMPREHENSIVE METABOLIC PANEL
AST: 18 U/L (ref 0–37)
Albumin: 4.1 g/dL (ref 3.5–5.2)
Alkaline Phosphatase: 51 U/L (ref 39–117)
Glucose, Bld: 78 mg/dL (ref 70–99)
Potassium: 4.9 mEq/L (ref 3.5–5.3)
Sodium: 138 mEq/L (ref 135–145)
Total Protein: 7.1 g/dL (ref 6.0–8.3)

## 2012-10-10 LAB — LIPID PANEL
LDL Cholesterol: 61 mg/dL (ref 0–99)
Triglycerides: 59 mg/dL (ref <150)
VLDL: 12 mg/dL (ref 0–40)

## 2012-10-14 NOTE — Assessment & Plan Note (Signed)
Severe with inadequate pain control, increase tramadol dose

## 2012-10-14 NOTE — Assessment & Plan Note (Signed)
Controlled, no change in medication Hyperlipidemia:Low fat diet discussed and encouraged.  \ 

## 2012-10-14 NOTE — Assessment & Plan Note (Signed)
Deterioration in function following CVa last year, increased hours requested from [facility]

## 2012-10-14 NOTE — Progress Notes (Signed)
  Subjective:    Patient ID: Judy Grant, female    DOB: 05-10-1917, 79 y.o.   MRN: [id number]  HPI The PT is here for follow up and re-evaluation of chronic medical conditions, medication management and review of any available recent lab and radiology data.  Preventive health is updated, specifically  Cancer screening and Immunization.  Still needs zostavax C/o uncontrolled joint pain , and increased fatigue with activity. Requesting recliner chair as well as increased [facility] assistance, both of which she needs No falls since last visit, though she is high risk No visible blood loss pe rectum, nose or in urine    Review of Systems    See HPI Denies recent fever or chills. Denies sinus pressure, nasal congestion, ear pain or sore throat. Denies chest congestion, productive cough or wheezing. Denies chest pains or  palpitations does have leg swelling and orthopnea, no PND Denies abdominal pain, nausea, vomiting,diarrhea or constipation.   Denies dysuria, frequency, hesitancy or incontinence. Uncontrolled  joint pain, swelling and limitation in mobility.Increased limitation in mobility Denies headaches, seizures, numbness, or tingling. Denies depression, anxiety or insomnia. Denies skin break down or rash.     Objective:   Physical Exam Patient alert and oriented and in no cardiopulmonary distress at rest, intolerant of excessive mobility  HEENT: No facial asymmetry, EOMI, no sinus tenderness,  oropharynx pink and moist.  Neck decreased ROM, no adenopathy.  Chest: Clear to auscultation bilaterally.  CVS: S1, S2 systolic murmur, no S3.No S3  ABD: Soft non tender. Bowel sounds normal.  Ext:one plus  edema  WG:[id number]  ROM spine, shoulders, hips and knees.Wheelchair   Skin: Intact, no ulcerations or rash noted.  Psych: Good eye contact, normal affect. Memory mildly impaired   CNS: CN 2-12 intact, reduced right upper and lower extremity power Decreased hearing     Assessment & Plan:

## 2012-10-14 NOTE — Assessment & Plan Note (Signed)
Residual hemiparesis, needs increased CAP hours

## 2012-10-14 NOTE — Assessment & Plan Note (Signed)
Continue weekly fosamax 

## 2012-10-14 NOTE — Assessment & Plan Note (Signed)
Adequate control, no med change 

## 2012-10-17 ENCOUNTER — Emergency Department (HOSPITAL_COMMUNITY)
Admission: EM | Admit: 2012-10-17 | Discharge: 2012-10-18 | Disposition: A | Discharge status: Home / Self Care | Payer: PRIVATE HEALTH INSURANCE | Attending: Emergency Medicine | Admitting: Emergency Medicine

## 2012-10-17 ENCOUNTER — Telehealth: Payer: Self-pay

## 2012-10-17 ENCOUNTER — Telehealth: Payer: Self-pay | Admitting: Family Medicine

## 2012-10-17 ENCOUNTER — Encounter (HOSPITAL_COMMUNITY): Payer: Self-pay | Admitting: *Deleted

## 2012-10-17 ENCOUNTER — Emergency Department (HOSPITAL_COMMUNITY): Payer: PRIVATE HEALTH INSURANCE

## 2012-10-17 DIAGNOSIS — Z8719 Personal history of other diseases of the digestive system: Secondary | ICD-10-CM | POA: Insufficient documentation

## 2012-10-17 DIAGNOSIS — Z7901 Long term (current) use of anticoagulants: Secondary | ICD-10-CM | POA: Insufficient documentation

## 2012-10-17 DIAGNOSIS — Z8673 Personal history of transient ischemic attack (TIA), and cerebral infarction without residual deficits: Secondary | ICD-10-CM | POA: Insufficient documentation

## 2012-10-17 DIAGNOSIS — M25551 Pain in right hip: Secondary | ICD-10-CM

## 2012-10-17 DIAGNOSIS — M25559 Pain in unspecified hip: Secondary | ICD-10-CM | POA: Insufficient documentation

## 2012-10-17 DIAGNOSIS — Z87891 Personal history of nicotine dependence: Secondary | ICD-10-CM | POA: Insufficient documentation

## 2012-10-17 DIAGNOSIS — Z79899 Other long term (current) drug therapy: Secondary | ICD-10-CM | POA: Insufficient documentation

## 2012-10-17 DIAGNOSIS — M25512 Pain in left shoulder: Secondary | ICD-10-CM

## 2012-10-17 DIAGNOSIS — M810 Age-related osteoporosis without current pathological fracture: Secondary | ICD-10-CM | POA: Insufficient documentation

## 2012-10-17 DIAGNOSIS — Z862 Personal history of diseases of the blood and blood-forming organs and certain disorders involving the immune mechanism: Secondary | ICD-10-CM | POA: Insufficient documentation

## 2012-10-17 DIAGNOSIS — Z9889 Other specified postprocedural states: Secondary | ICD-10-CM | POA: Insufficient documentation

## 2012-10-17 DIAGNOSIS — Z8679 Personal history of other diseases of the circulatory system: Secondary | ICD-10-CM | POA: Insufficient documentation

## 2012-10-17 DIAGNOSIS — M109 Gout, unspecified: Secondary | ICD-10-CM | POA: Insufficient documentation

## 2012-10-17 DIAGNOSIS — I10 Essential (primary) hypertension: Secondary | ICD-10-CM | POA: Insufficient documentation

## 2012-10-17 DIAGNOSIS — IMO0002 Reserved for concepts with insufficient information to code with codable children: Secondary | ICD-10-CM | POA: Insufficient documentation

## 2012-10-17 DIAGNOSIS — Z8781 Personal history of (healed) traumatic fracture: Secondary | ICD-10-CM | POA: Insufficient documentation

## 2012-10-17 DIAGNOSIS — M25519 Pain in unspecified shoulder: Secondary | ICD-10-CM | POA: Insufficient documentation

## 2012-10-17 DIAGNOSIS — Z7982 Long term (current) use of aspirin: Secondary | ICD-10-CM | POA: Insufficient documentation

## 2012-10-17 LAB — URIC ACID: Uric Acid, Serum: 4.7 mg/dL (ref 2.4–7.0)

## 2012-10-17 MED ORDER — HYDROCODONE-ACETAMINOPHEN 5-325 MG PO TABS
1.0000 | ORAL_TABLET | Freq: Once | ORAL | Status: AC
  Administered 2012-10-17: 1 via ORAL
  Filled 2012-10-17: qty 1

## 2012-10-17 NOTE — ED Notes (Signed)
Pt to department via EMS.  Reporting increased pain in left shoulder and right hip.  No falls reported.

## 2012-10-17 NOTE — Telephone Encounter (Signed)
Noted pt PCP prescribed tramadol for the pt pain, advised pt to contact PCP to advise this medication is not working for her, pt understood and will call PCP

## 2012-10-17 NOTE — ED Notes (Signed)
Med rec in process

## 2012-10-17 NOTE — Telephone Encounter (Signed)
Patient taking Tramadol for arthritis pain, she has taken this, but still hurting.  Can she take ibuprofin too.  She is also on blood thinner warfarin.    Please call patient and advise.

## 2012-10-17 NOTE — Telephone Encounter (Signed)
Currently on tramadol. Wants something stronger called in

## 2012-10-17 NOTE — ED Provider Notes (Signed)
History  This chart was scribed for Judy Givens, MD by [name], ED Scribe. This patient was seen in room APA17/[facility]17 and the patient's care was started at 9:07 PM.  CSN: [id number]  Arrival date & time 10/17/12  2007   Chief Complaint  Patient presents with  . Shoulder Pain  . Hip Pain    The history is provided by the patient and a relative. No language interpreter was used.   HPI Comments: Judy Grant is a 79 y.o. female with a history of stroke, osteoporosis, degenerative joint disease, HTN and anemia brought by EMS to the Emergency Department complaining of gradually worsening left shoulder pain over the past 1 1/2 months. Pt reports having a stroke about 6 months ago, affecting her right side. She states that she recovered the ability to walk without limping after the stroke and has not had weakness since. However, for the past month, she complains of gradually worsening left shoulder and upper arm pain. This pain became severe today, making it difficult for pt to move her left arm and the pain is worsened with moving her left arm. Pt also complains of a tingling sensation in the fingers of her right hand. Pt saw her PCP, Dr. [name] about 1 week ago. She states she discussed her shoulder pain without diagnosis. She denies personal or family history of gout, however gout is in her past medical history records and she is not a perfect historian.  She also complains of right hip pain.She has a history of a fall and right hip fracture about 30 years ago. She states that she saw an orthopedist for this, but has not seen one since. She denies recent falls or injuries. Her emphasis is more on her left arm pain than this pain. Pt denies fever, back pain, neck pain, abdominal pain or any other pain or symptoms. Pt lives alone.  PCP- Dr. [name]   Past Medical History  Diagnosis Date  . Hyperlipidemia   . Osteoporosis   . Hypertension     with severe left  ventricular hypertrophy; normal ejection fraction in 04/2005  . Valvular heart disease     Mild to moderate aortic stenosis; moderate to severe mitral stenosis in 2007; mild MR  . Paroxysmal atrial fibrillation     Remote  . Diverticulosis     Pancolonic; h/o LGI bleeding and diverticulitis  . Degenerative joint disease     Knees  . Chronic kidney disease     Mild; creatinine of 1.19-1.28 in recent years  . Anemia, iron deficiency   . Popliteal cyst     Left  . Fracture of wrist 2008    left  . Gout   . Stroke    Past Surgical History  Procedure Laterality Date  . Appendectomy  1944  . Total hip arthroplasty  1994   Family History  Problem Relation Age of Onset  . Diabetes Mother    History  Substance Use Topics  . Smoking status: Former Smoker    Quit date: 08/31/1972  . Smokeless tobacco: Never Used  . Alcohol Use: No  lives at home Lives alone  OB History   Grav Para Term Preterm Abortions TAB SAB Ect Mult Living                 Review of Systems  Constitutional: Negative for fever.  HENT: Negative for neck pain.   Gastrointestinal: Negative for abdominal pain.  Musculoskeletal: Negative for back pain.  Left shoulder pain. Right hip pain.   All other systems reviewed and are negative.    Allergies  Review of patient's allergies indicates no known allergies.  Home Medications   Current Outpatient Rx  Name  Route  Sig  Dispense  Refill  . alendronate (FOSAMAX) 70 MG tablet   Oral   Take 70 mg by mouth every 7 (seven) days. Takes on Wednesdays: Take with a full glass of water on an empty stomach.         Marland Kitchen allopurinol (ZYLOPRIM) 100 MG tablet   Oral   Take 100 mg by mouth every morning.          Marland Kitchen aspirin EC 81 MG tablet   Oral   Take 81 mg by mouth every morning.          . Calcium Carb-Cholecalciferol (CALCIUM 500 +D) 500-400 MG-UNIT TABS   Oral   Take 1 tablet by mouth 3 (three) times daily.         Marland Kitchen docusate sodium (COLACE)  50 MG capsule   Oral   Take by mouth 2 (two) times daily.         . fluticasone (FLONASE) 50 MCG/ACT nasal spray   Nasal   Place 2 sprays into the nose daily.         . furosemide (LASIX) 40 MG tablet   Oral   Take 40 mg by mouth every morning.          . metoprolol (LOPRESSOR) 50 MG tablet   Oral   Take 50-75 mg by mouth 2 (two) times daily. Take one & one-half tablet in the morning and one tablet at bedtime         . polyethylene glycol powder (GLYCOLAX/MIRALAX) powder   Oral   Take 17 g by mouth daily as needed (for constipation).         . potassium chloride (K-DUR) 10 MEQ tablet   Oral   Take 10 mEq by mouth every Monday, Wednesday, and Friday.          . simvastatin (ZOCOR) 20 MG tablet   Oral   Take 20 mg by mouth at bedtime.         . traMADol (ULTRAM) 50 MG tablet   Oral   Take 50 mg by mouth every 6 (six) hours as needed for pain.          . valsartan (DIOVAN) 80 MG tablet   Oral   Take 80 mg by mouth every morning.          . warfarin (COUMADIN) 1 MG tablet   Oral   Take 1 tablet (1 mg total) by mouth daily.   45 tablet   3   . warfarin (COUMADIN) 2.5 MG tablet   Oral   Take 1 tablet (2.5 mg total) by mouth as directed.   45 tablet   3   . Wheat Dextrin (BENEFIBER PO)   Oral   Take 1 each by mouth daily.          Triage Vitals: BP 170/94  Pulse 88  Temp(Src) 98.9 F (37.2 C) (Oral)  Resp 18  Ht 5\' 5"  (1.651 m)  Wt 130 lb (58.968 kg)  BMI 21.63 kg/m2  SpO2 96%  Vital signs normal    Physical Exam  Nursing note and vitals reviewed. Constitutional: She is oriented to person, place, and time.  Non-toxic appearance. She does not appear ill. No distress.  Thin elderly  frail female  HENT:  Head: Normocephalic and atraumatic.  Right Ear: External ear normal.  Left Ear: External ear normal.  Nose: Nose normal. No mucosal edema or rhinorrhea.  Mouth/Throat: Oropharynx is clear and moist and mucous membranes are normal. No  dental abscesses or edematous.  Eyes: Conjunctivae and EOM are normal. Pupils are equal, round, and reactive to light.  Neck: Normal range of motion and full passive range of motion without pain. Neck supple.  Cardiovascular: Normal rate, regular rhythm and normal heart sounds.  Exam reveals no gallop and no friction rub.   No murmur heard. Pulmonary/Chest: Effort normal and breath sounds normal. No respiratory distress. She has no wheezes. She has no rhonchi. She has no rales. She exhibits no tenderness and no crepitus.  Abdominal: Soft. Normal appearance and bowel sounds are normal. She exhibits no distension. There is no tenderness. There is no rebound and no guarding.  Musculoskeletal: Normal range of motion. She exhibits no edema and no tenderness.  Moderate swelling in left shoulder, ? effusion. Clavicle is non-tender except laterally. No pain on flexion, extension, supination, or pronation of elbow or wrist. Flexion of right hip causes pain in right hip joint. Flexion of left hip and left knee causes pain in her knee.  Neurological: She is alert and oriented to person, place, and time. She has normal strength. No cranial nerve deficit.  Skin: Skin is warm, dry and intact. No rash noted. No erythema. No pallor.  Psychiatric: She has a normal mood and affect. Her speech is normal and behavior is normal. Her mood appears not anxious.    ED Course  Procedures (including critical care time)  Medications  HYDROcodone-acetaminophen (NORCO/VICODIN) 5-325 MG per tablet 1 tablet (1 tablet Oral Given 10/17/12 2128)   DIAGNOSTIC STUDIES: Oxygen Saturation is 96% on RA, adequate by my interpretation.    COORDINATION OF CARE: 9:13 PM- Pt advised of plan for treatment and pt agrees.  Pain is better after medications.   Results for orders placed during the hospital encounter of 10/17/12  URIC ACID      Result Value Range   Uric Acid, Serum 4.7  2.4 - 7.0 mg/dL    Dg Shoulder  Right  10/17/2012   *RADIOLOGY REPORT*  Clinical Data: Right shoulder pain.  RIGHT SHOULDER - 2+ VIEW  Comparison: Right shoulder radiographs 01/05/2012.  Findings: The right shoulder is located.  Advanced degenerative changes are present.  No acute abnormality is present.  IMPRESSION:  1.  Stable advanced degenerative changes of the right shoulder. 2.  No acute abnormality.   Original Report Authenticated By: Marin Roberts, M.D.   Dg Hip Complete Left  10/17/2012   *RADIOLOGY REPORT*  Clinical Data: Left hip pain.  LEFT HIP - COMPLETE 2+ VIEW  Comparison: None.  Findings: The patient is status post right hip hemiarthroplasty. The left hip is located.  No acute bone or soft tissue abnormalities present.  Vascular calcifications are evident.  Mild degenerative changes present at the left hip.  IMPRESSION:  1.  Mild degenerative changes of the left hip. 2.  No acute abnormality. 3.  Atherosclerosis. 4.  Status post right hip hemiarthroplasty without complication.   Original Report Authenticated By: Marin Roberts, M.D.   Dg Hip Complete Right  10/18/2012   *RADIOLOGY REPORT*  Clinical Data: Right hip pain.  RIGHT HIP - COMPLETE 2+ VIEW  Comparison: Right hip radiographs 05/03/2005.  Findings: The patient is status post right hip hemiarthroplasty. Acetabular remodeling is similar to  the prior exam.  No acute abnormality is present.  Moderate stool is present in the rectum.  IMPRESSION:  1.  Stable appearance of the right hip hemiarthroplasty. 2.  No acute abnormality. 3.  Moderate stool in the rectum.   Original Report Authenticated By: Marin Roberts, M.D.   Dg Shoulder Left  10/18/2012   *RADIOLOGY REPORT*  Clinical Data: Increased pain in the left shoulder.  LEFT SHOULDER - 2+ VIEW  Comparison: One-view chest 01/23/2012.  Findings: Sclerotic changes are present in the humeral head.  Loose bodies are suggested in the joint.  The joint is located.  No acute fracture present.  IMPRESSION:  1.   Similar appearance of advanced degenerative changes in the left shoulder and flattening of the humeral head and sclerosis. 2.  Probable loose bodies within the joint.   Original Report Authenticated By: Marin Roberts, M.D.    1. Shoulder pain, acute, left   2. Hip pain, acute, right    New Prescriptions   HYDROCODONE-ACETAMINOPHEN (NORCO/VICODIN) 5-325 MG PER TABLET    Take 1 tablet by mouth every 6 (six) hours as needed for pain.    Plan discharge   Devoria Albe, MD, FACEP     MDM    I personally performed the services described in this documentation, which was scribed in my presence. The recorded information has been reviewed and considered.  Devoria Albe, MD, Armando Gang   Judy Givens, MD 10/18/12 [phone]

## 2012-10-18 ENCOUNTER — Telehealth: Payer: Self-pay | Admitting: Family Medicine

## 2012-10-18 MED ORDER — HYDROCODONE-ACETAMINOPHEN 5-325 MG PO TABS: 1.0000 | ORAL_TABLET | Freq: Four times a day (QID) | ORAL | Status: DC | PRN

## 2012-10-18 NOTE — Telephone Encounter (Signed)
Patient aware.

## 2012-10-18 NOTE — Telephone Encounter (Signed)
pls tell her to tTRY the hydrocodone one twice daily , see if it helps her better. She needs to be carwefully as it may make her sleepy anjd higher fall risk, but I do advise her to try it since her pain is so bad. She is to stop the pain med I gave her when taking the hydrocodone, do NOT tAKE both

## 2012-10-18 NOTE — Telephone Encounter (Signed)
See new message

## 2012-10-18 NOTE — Telephone Encounter (Signed)
Please advise 

## 2012-10-18 NOTE — Telephone Encounter (Signed)
Noted  

## 2012-10-22 ENCOUNTER — Other Ambulatory Visit: Payer: Self-pay | Admitting: Cardiology

## 2012-10-22 NOTE — Telephone Encounter (Signed)
Medication sent via escribe.  

## 2012-10-29 ENCOUNTER — Ambulatory Visit (INDEPENDENT_AMBULATORY_CARE_PROVIDER_SITE_OTHER): Payer: PRIVATE HEALTH INSURANCE | Admitting: *Deleted

## 2012-10-29 DIAGNOSIS — I480 Paroxysmal atrial fibrillation: Secondary | ICD-10-CM

## 2012-10-29 DIAGNOSIS — I639 Cerebral infarction, unspecified: Secondary | ICD-10-CM

## 2012-10-29 DIAGNOSIS — I4891 Unspecified atrial fibrillation: Secondary | ICD-10-CM

## 2012-10-29 DIAGNOSIS — I6350 Cerebral infarction due to unspecified occlusion or stenosis of unspecified cerebral artery: Secondary | ICD-10-CM

## 2012-10-29 DIAGNOSIS — Z7901 Long term (current) use of anticoagulants: Secondary | ICD-10-CM

## 2012-10-29 LAB — POCT INR: INR: 2.4

## 2012-11-20 ENCOUNTER — Ambulatory Visit (HOSPITAL_COMMUNITY)
Admission: RE | Admit: 2012-11-20 | Discharge: 2012-11-20 | Disposition: A | Discharge status: Home / Self Care | Payer: PRIVATE HEALTH INSURANCE | Source: Ambulatory Visit | Attending: Family Medicine | Admitting: Family Medicine

## 2012-11-20 ENCOUNTER — Telehealth: Payer: Self-pay | Admitting: *Deleted

## 2012-11-20 ENCOUNTER — Encounter: Payer: Self-pay | Admitting: Family Medicine

## 2012-11-20 ENCOUNTER — Ambulatory Visit (INDEPENDENT_AMBULATORY_CARE_PROVIDER_SITE_OTHER): Payer: PRIVATE HEALTH INSURANCE | Admitting: Family Medicine

## 2012-11-20 VITALS — BP 152/84 | HR 78 | Resp 16 | Wt 139.4 lb

## 2012-11-20 DIAGNOSIS — M79642 Pain in left hand: Secondary | ICD-10-CM

## 2012-11-20 DIAGNOSIS — M7989 Other specified soft tissue disorders: Secondary | ICD-10-CM | POA: Insufficient documentation

## 2012-11-20 DIAGNOSIS — M899 Disorder of bone, unspecified: Secondary | ICD-10-CM | POA: Insufficient documentation

## 2012-11-20 DIAGNOSIS — IMO0002 Reserved for concepts with insufficient information to code with codable children: Secondary | ICD-10-CM

## 2012-11-20 DIAGNOSIS — Z7901 Long term (current) use of anticoagulants: Secondary | ICD-10-CM

## 2012-11-20 DIAGNOSIS — T148XXA Other injury of unspecified body region, initial encounter: Secondary | ICD-10-CM

## 2012-11-20 DIAGNOSIS — I10 Essential (primary) hypertension: Secondary | ICD-10-CM

## 2012-11-20 DIAGNOSIS — Z5181 Encounter for therapeutic drug level monitoring: Secondary | ICD-10-CM

## 2012-11-20 DIAGNOSIS — M79609 Pain in unspecified limb: Secondary | ICD-10-CM

## 2012-11-20 DIAGNOSIS — M19049 Primary osteoarthritis, unspecified hand: Secondary | ICD-10-CM | POA: Insufficient documentation

## 2012-11-20 DIAGNOSIS — M6740 Ganglion, unspecified site: Secondary | ICD-10-CM

## 2012-11-20 LAB — PROTIME-INR: Prothrombin Time: 21.4 seconds — ABNORMAL HIGH (ref 11.6–15.2)

## 2012-11-20 NOTE — Telephone Encounter (Signed)
Pts INR is 1.92 and felt  due to bruising  of hand not to change her coumadin dose at this time but to change her appt to have INR checked again on Sept 3rd. This information given to pts daughter and she states understanding.

## 2012-11-20 NOTE — Progress Notes (Signed)
  Subjective:    Patient ID: Judy Grant, female    DOB: July 13, 1917, 79 y.o.   MRN: [id number]  HPI  Bruise on left hand sustained 6 days ago, unaware of how she got this, Denies visible blood in urine or stool, states however that when she strains she does see bood in stool, states she takes softener regularly, importance of keeping stool soft and evacuating regularly is stressed  Review of Systems See HPI Denies recent fever or chills. Denies sinus pressure, nasal congestion, ear pain or sore throat. Denies chest congestion, productive cough or wheezing. Denies chest pains Denies abdominal pain, nausea, vomiting,diarrhea  Denies dysuria, frequency, hesitancy or incontinence. Chronic severe joint pain and deformity with limitation in mobility       Objective:   Physical Exam Patient alert and oriented and in no cardiopulmonary distress.  HEENT: No facial asymmetry, EOMI, no sinus tenderness,  oropharynx pink and moist.  Neck decreased ROM, no JVd   no adenopathy.  Chest: Clear to auscultation bilaterally.  CVS: S1, S2 systolic  murmurs, no S3.  ABD: Soft non tender.  Ext: No edema  WU:[id number] decreased  Adequate ROM spine, shoulders, hips and knees.  Skin: Intact, bruise on one third dorsum of left hand with dime sized non tender nodule palpable .  Psych: Good eye contact, normal affect. Memory impaired t not anxious or depressed appearing.  CNS: CN 2-12 intact,         Assessment & Plan:

## 2012-11-20 NOTE — Patient Instructions (Addendum)
F/u as before.  The swelling on your hand is a bruise, be careful as far as bumping into things and hitting yourself accidentally, since you are on a blood thinner , there is increased risk of bleeding and bruises.  Xray of the hand today, and you will be referred to Dr Hilda Lias for him to assess the swelling on the hand which you report as new, I think thatt it is a cyst   Blood work today next door to make sure that your blood is not too thin, we will also send to the coumadin clinic. We will contact you if there is a problem PT/INR stat pls also send copy to coumadin clinic

## 2012-11-23 NOTE — Assessment & Plan Note (Signed)
New x 5 days, unaware of trauma that caused this, cautioned re risk of bleeding due to chronic anticoagullation Xray hand today

## 2012-11-23 NOTE — Assessment & Plan Note (Signed)
Controlled, no change in medication  

## 2012-11-23 NOTE — Assessment & Plan Note (Signed)
Refer tpo ortho for eval, no intervention anticipated as being necessary, second opinion only, since reported as new, and there is surrounding bruise

## 2012-11-28 ENCOUNTER — Ambulatory Visit (INDEPENDENT_AMBULATORY_CARE_PROVIDER_SITE_OTHER): Payer: PRIVATE HEALTH INSURANCE | Admitting: *Deleted

## 2012-11-28 DIAGNOSIS — I480 Paroxysmal atrial fibrillation: Secondary | ICD-10-CM

## 2012-11-28 DIAGNOSIS — I639 Cerebral infarction, unspecified: Secondary | ICD-10-CM

## 2012-11-28 DIAGNOSIS — Z7901 Long term (current) use of anticoagulants: Secondary | ICD-10-CM

## 2012-11-28 DIAGNOSIS — I4891 Unspecified atrial fibrillation: Secondary | ICD-10-CM

## 2012-11-28 DIAGNOSIS — I6350 Cerebral infarction due to unspecified occlusion or stenosis of unspecified cerebral artery: Secondary | ICD-10-CM

## 2012-12-05 ENCOUNTER — Other Ambulatory Visit: Payer: Self-pay | Admitting: Family Medicine

## 2012-12-10 ENCOUNTER — Ambulatory Visit (INDEPENDENT_AMBULATORY_CARE_PROVIDER_SITE_OTHER): Payer: PRIVATE HEALTH INSURANCE

## 2012-12-10 DIAGNOSIS — Z23 Encounter for immunization: Secondary | ICD-10-CM

## 2012-12-26 ENCOUNTER — Ambulatory Visit (INDEPENDENT_AMBULATORY_CARE_PROVIDER_SITE_OTHER): Payer: PRIVATE HEALTH INSURANCE | Admitting: *Deleted

## 2012-12-26 DIAGNOSIS — Z7901 Long term (current) use of anticoagulants: Secondary | ICD-10-CM

## 2012-12-26 DIAGNOSIS — I639 Cerebral infarction, unspecified: Secondary | ICD-10-CM

## 2012-12-26 DIAGNOSIS — I4891 Unspecified atrial fibrillation: Secondary | ICD-10-CM

## 2012-12-26 DIAGNOSIS — I480 Paroxysmal atrial fibrillation: Secondary | ICD-10-CM

## 2012-12-26 DIAGNOSIS — I6350 Cerebral infarction due to unspecified occlusion or stenosis of unspecified cerebral artery: Secondary | ICD-10-CM

## 2012-12-26 LAB — POCT INR: INR: 2.1

## 2012-12-27 ENCOUNTER — Other Ambulatory Visit: Payer: Self-pay | Admitting: Cardiology

## 2012-12-27 ENCOUNTER — Other Ambulatory Visit: Payer: Self-pay | Admitting: Family Medicine

## 2013-01-21 ENCOUNTER — Other Ambulatory Visit: Payer: Self-pay | Admitting: Cardiology

## 2013-01-30 ENCOUNTER — Other Ambulatory Visit: Payer: Self-pay | Admitting: Family Medicine

## 2013-01-31 ENCOUNTER — Encounter: Payer: Self-pay | Admitting: Family Medicine

## 2013-01-31 ENCOUNTER — Ambulatory Visit (INDEPENDENT_AMBULATORY_CARE_PROVIDER_SITE_OTHER): Payer: PRIVATE HEALTH INSURANCE | Admitting: Family Medicine

## 2013-01-31 ENCOUNTER — Encounter (INDEPENDENT_AMBULATORY_CARE_PROVIDER_SITE_OTHER): Payer: Self-pay

## 2013-01-31 VITALS — BP 128/74 | HR 77 | Resp 16 | Ht 63.0 in | Wt 139.4 lb

## 2013-01-31 DIAGNOSIS — M129 Arthropathy, unspecified: Secondary | ICD-10-CM

## 2013-01-31 DIAGNOSIS — G8190 Hemiplegia, unspecified affecting unspecified side: Secondary | ICD-10-CM

## 2013-01-31 DIAGNOSIS — E785 Hyperlipidemia, unspecified: Secondary | ICD-10-CM

## 2013-01-31 DIAGNOSIS — I10 Essential (primary) hypertension: Secondary | ICD-10-CM

## 2013-01-31 DIAGNOSIS — Z9181 History of falling: Secondary | ICD-10-CM

## 2013-01-31 DIAGNOSIS — Z7901 Long term (current) use of anticoagulants: Secondary | ICD-10-CM

## 2013-01-31 MED ORDER — TRAMADOL HCL 50 MG PO TABS: ORAL_TABLET | ORAL | Status: DC

## 2013-01-31 NOTE — Patient Instructions (Signed)
F/u in 4.5 month, call if you need me before  New for arthritis is tramadol one tablet twice weekly if needed.  Call  If you have recurrent nose bleed so you can be referred to ENT  Fasting lipid, cmp and TSH in 4.5 month

## 2013-01-31 NOTE — Progress Notes (Signed)
  Subjective:    Patient ID: Judy Grant, female    DOB: 12-04-1917, 79 y.o.   MRN: [id number]  HPI The PT is here for follow up and re-evaluation of chronic medical conditions, medication management and review of any available recent lab and radiology data.  Preventive health is updated, specifically  Cancer screening and Immunization.    The PT denies any adverse reactions to current medications since the last visit.  2 recent episodes of unilateral right epistaxis, resolved with direct nasal pressure, no interest in eNT eval currently Also recently had acute nausea and loose stool for approx 2 days, which has also resolved      Review of Systems See HPI Denies recent fever or chills. Denies sinus pressure, nasal congestion, ear pain or sore throat. Denies chest congestion, productive cough or wheezing. Denies chest pains, palpitations and leg swelling Denies abdominal pain, nausea, vomiting,diarrhea or constipation.   Denies dysuria, frequency, hesitancy or incontinence. C/o chronic  joint pain, swelling and limitation in mobility. Denies headaches, seizures, numbness, or tingling. Denies depression, anxiety or insomnia. Denies skin break down or rash.        Objective:   Physical Exam  Patient alert  and in no cardiopulmonary distress.limited in mobility to wheelchair  HEENT: No facial asymmetry, EOMI, no sinus tenderness,  oropharynx pink and moist.  Neck decreased ROM no adenopathy.No JVD  Chest: Clear to auscultation bilaterally.  CVS: S1, S2 systolic  murmurs, no S3.No JVD , no adenopathy  ABD: Soft non tender. Bowel sounds normal.  Ext: No edema  MS: decreased  ROM spine, shoulders, hips and knees.Marked deformity of joints in both hands  Skin: Intact, no ulcerations or rash noted.  Psych: Good eye contact, normal affect. Memoryloss not anxious or depressed appearing.  CNS: CN 2-12 intact, hearing loss      Assessment & Plan:

## 2013-02-01 NOTE — Assessment & Plan Note (Signed)
Home safety again discussec with her caregiver, no report of falls this year

## 2013-02-01 NOTE — Assessment & Plan Note (Signed)
Unchanged, follwoig CVA

## 2013-02-01 NOTE — Assessment & Plan Note (Signed)
Fol,lowed in coumadin clinic, reports recent unilateral epistaxis x 2 , no interest in ENT eval at this time, resolved with pressure

## 2013-02-01 NOTE — Assessment & Plan Note (Signed)
Controlled, no change in medication Hyperlipidemia:Low fat diet discussed and encouraged.  Updated lab next visit 

## 2013-02-01 NOTE — Assessment & Plan Note (Signed)
Pt to avoid nSAID

## 2013-02-01 NOTE — Assessment & Plan Note (Signed)
Controlled, no change in medication  

## 2013-02-01 NOTE — Assessment & Plan Note (Signed)
Tramadol as needed, for uncontrolled pain, use expected to be one tablet twice weekly

## 2013-02-05 NOTE — Progress Notes (Signed)
HPI: Judy Grant is a 79 year old former patient of Dr. Dietrich Pates that we have are following for ongoing assessment and management of aortic valve disease, rheumatic in origin; she also has a history of atrial fibrillation hypertension hyperlipidemia iron deficiency anemia. His last in the office in April of 2014. The patient had issues with neck pain and was found to have mild multilevel spondylolisthesis, and referred to primary care for pain management. The patient's medications were not changed, she was asymptomatic for chest pain.    She has been having watery diarrhea for 2-3 weeks. But now is constipated. She denies bloody discharge. She denies dizziness or cramping, chest pain or palpitations.  No Known Allergies  Current Outpatient Prescriptions  Medication Sig Dispense Refill  . alendronate (FOSAMAX) 70 MG tablet TAKE 1 TAB EACH WEEK 30 MIN PRIOR TO BREAKFAST WITH LARGE GLASS OF WATER. REMAIN UPRIGHT.  4 tablet  1  . allopurinol (ZYLOPRIM) 100 MG tablet TAKE ONE TABLET BY MOUTH ONCE DAILY.  30 tablet  2  . ASPIRIN LOW DOSE 81 MG EC tablet TAKE ONE TABLET BY MOUTH ONCE DAILY.  30 tablet  2  . Calcium Carb-Cholecalciferol 500-400 MG-UNIT TABS TAKE (1) TABLET BY MOUTH (3) TIMES DAILY.  90 tablet  2  . COLACE 50 MG capsule TAKE (1) CAPSULE BY MOUTH TWICE DAILY.  60 capsule  2  . DIOVAN 80 MG tablet TAKE ONE TABLET BY MOUTH ONCE DAILY.  30 tablet  2  . fluticasone (FLONASE) 50 MCG/ACT nasal spray Place 2 sprays into the nose daily.      . furosemide (LASIX) 40 MG tablet TAKE 1 TABLET BY MOUTH ONCE DAILY.  30 tablet  2  . HYDROcodone-acetaminophen (NORCO/VICODIN) 5-325 MG per tablet Take 1 tablet by mouth every 4 (four) hours as needed.      . metoprolol (LOPRESSOR) 50 MG tablet TAKE 1& 1/2 TABLETS EACH MORNING.  75 tablet  2  . polyethylene glycol powder (GLYCOLAX/MIRALAX) powder Take 17 g by mouth daily as needed (for constipation).      . potassium chloride (K-DUR) 10 MEQ tablet TAKE  (1) TABLET BY MOUTH ON TUESDAYS, THURSDAYS, SATURDAYS, AND SUNDAYS.  45 tablet  3  . simvastatin (ZOCOR) 20 MG tablet TAKE (1) TABLET BY MOUTH AT BEDTIME FOR CHOLESTEROL.  30 tablet  2  . traMADol (ULTRAM) 50 MG tablet One tablet twice weekly, as needed, for uncontrolled arthritic pain  30 tablet  0  . warfarin (COUMADIN) 1 MG tablet TAKE 1 TABLET BY MOUTH ONCE A DAY,ALONG WITH 2.5 MG DAILY TO EQUAL 3.5 MG.  30 tablet  2  . warfarin (COUMADIN) 2.5 MG tablet TAKE 1 TABLET BY MOUTH ONCE A DAY,ALONG WITH 1 MG TO EQUAL 3.5 MG DAILY.  30 tablet  2  . Wheat Dextrin (BENEFIBER PO) Take 1 each by mouth daily.       No current facility-administered medications for this visit.    Past Medical History  Diagnosis Date  . Hyperlipidemia   . Osteoporosis   . Hypertension     with severe left ventricular hypertrophy; normal ejection fraction in 04/2005  . Valvular heart disease     Mild to moderate aortic stenosis; moderate to severe mitral stenosis in 2007; mild MR  . Paroxysmal atrial fibrillation     Remote  . Diverticulosis     Pancolonic; h/o LGI bleeding and diverticulitis  . Degenerative joint disease     Knees  . Chronic kidney disease  Mild; creatinine of 1.19-1.28 in recent years  . Anemia, iron deficiency   . Popliteal cyst     Left  . Fracture of wrist 2008    left  . Gout   . Stroke     Past Surgical History  Procedure Laterality Date  . Appendectomy  1944  . Total hip arthroplasty  1994    ZOX:[id number] of systems complete and found to be negative unless listed above  PHYSICAL EXAM BP 144/70  Pulse 77  Wt 139 lb (63.05 kg)  General: Well developed, well nourished, in no acute distress Head: Eyes PERRLA, No xanthomas.   Normal cephalic and atramatic  Lungs: Clear bilaterally to auscultation with mild crackles in the bases.Marland Kitchen Heart: HRRR S1 S2, 2/6 holosystolic murmur.  Pulses are 2+ & equal.            No carotid bruit. No JVD.  No abdominal bruits. No femoral  bruits. Abdomen: Bowel sounds are positive, abdomen soft and non-tender without masses or  Hernia's noted. Msk:  Back normal, normal gait. Normal strength and tone for age. Extremities: No clubbing, cyanosis , non-pitting edema.  DP +1 Neuro: Alert and oriented X 3. Psych:  Good affect, responds appropriately    ASSESSMENT AND PLAN

## 2013-02-06 ENCOUNTER — Ambulatory Visit (INDEPENDENT_AMBULATORY_CARE_PROVIDER_SITE_OTHER): Payer: PRIVATE HEALTH INSURANCE | Admitting: *Deleted

## 2013-02-06 ENCOUNTER — Encounter: Payer: Self-pay | Admitting: Adult Health

## 2013-02-06 ENCOUNTER — Ambulatory Visit (INDEPENDENT_AMBULATORY_CARE_PROVIDER_SITE_OTHER): Payer: PRIVATE HEALTH INSURANCE | Admitting: Adult Health

## 2013-02-06 VITALS — BP 144/70 | HR 77 | Wt 139.0 lb

## 2013-02-06 DIAGNOSIS — I10 Essential (primary) hypertension: Secondary | ICD-10-CM

## 2013-02-06 DIAGNOSIS — Z7901 Long term (current) use of anticoagulants: Secondary | ICD-10-CM

## 2013-02-06 DIAGNOSIS — I6350 Cerebral infarction due to unspecified occlusion or stenosis of unspecified cerebral artery: Secondary | ICD-10-CM

## 2013-02-06 DIAGNOSIS — I639 Cerebral infarction, unspecified: Secondary | ICD-10-CM

## 2013-02-06 DIAGNOSIS — I350 Nonrheumatic aortic (valve) stenosis: Secondary | ICD-10-CM

## 2013-02-06 DIAGNOSIS — N189 Chronic kidney disease, unspecified: Secondary | ICD-10-CM

## 2013-02-06 DIAGNOSIS — I4891 Unspecified atrial fibrillation: Secondary | ICD-10-CM

## 2013-02-06 DIAGNOSIS — N183 Chronic kidney disease, stage 3 unspecified: Secondary | ICD-10-CM

## 2013-02-06 DIAGNOSIS — I480 Paroxysmal atrial fibrillation: Secondary | ICD-10-CM

## 2013-02-06 DIAGNOSIS — I359 Nonrheumatic aortic valve disorder, unspecified: Secondary | ICD-10-CM

## 2013-02-06 LAB — CBC
Hemoglobin: 10.6 g/dL — ABNORMAL LOW (ref 12.0–15.0)
MCH: 32.2 pg (ref 26.0–34.0)
MCHC: 35.2 g/dL (ref 30.0–36.0)
MCV: 91.5 fL (ref 78.0–100.0)
Platelets: 196 10*3/uL (ref 150–400)
RDW: 14.4 % (ref 11.5–15.5)

## 2013-02-06 NOTE — Assessment & Plan Note (Signed)
Significant systolic murmur but she is asymptomatic and very sedentary. Wheel chair bound due to arthritic knees.

## 2013-02-06 NOTE — Patient Instructions (Signed)
Your physician recommends that you schedule a follow-up appointment in: 6 months You will receive a reminder letter two months in advance reminding you to call and schedule your appointment. If you don't receive this letter, please contact our office.  Your physician recommends that you return for lab work this week. CBC, BMET

## 2013-02-06 NOTE — Assessment & Plan Note (Signed)
INR today 4.1 Coumadin is being held for two days. CBC will be drawn.

## 2013-02-06 NOTE — Assessment & Plan Note (Signed)
Blood pressure is well controlled currently. Will continue current medications. Will check BMET in the setting of diuretic use for kidney fx with copy to Dr. Lodema Hong.

## 2013-02-06 NOTE — Progress Notes (Deleted)
Name: Judy Grant    DOB: 07-28-1917  Age: 79 y.o.  MR#: [id number]       PCP:  [name], MD      Insurance: Payor: Cleatrice Burke MEDICARE / Plan: Ollen Gross / Product Type: *No Product type* /   CC:    Chief Complaint  Patient presents with  . Atrial Fibrillation  . Aortic Stenosis  . Hypertension    VS Filed Vitals:   02/06/13 1348  BP: 144/70  Pulse: 77  Weight: 139 lb (63.05 kg)    Weights Current Weight  02/06/13 139 lb (63.05 kg)  01/31/13 139 lb 6.4 oz (63.231 kg)  11/20/12 139 lb 6.4 oz (63.231 kg)    Blood Pressure  BP Readings from Last 3 Encounters:  02/06/13 144/70  01/31/13 128/74  11/20/12 152/84     Admit date:  (Not on file) Last encounter with RMR:  Visit date not found   Allergy Review of patient's allergies indicates no known allergies.  Current Outpatient Prescriptions  Medication Sig Dispense Refill  . alendronate (FOSAMAX) 70 MG tablet TAKE 1 TAB EACH WEEK 30 MIN PRIOR TO BREAKFAST WITH LARGE GLASS OF WATER. REMAIN UPRIGHT.  4 tablet  1  . allopurinol (ZYLOPRIM) 100 MG tablet TAKE ONE TABLET BY MOUTH ONCE DAILY.  30 tablet  2  . ASPIRIN LOW DOSE 81 MG EC tablet TAKE ONE TABLET BY MOUTH ONCE DAILY.  30 tablet  2  . Calcium Carb-Cholecalciferol 500-400 MG-UNIT TABS TAKE (1) TABLET BY MOUTH (3) TIMES DAILY.  90 tablet  2  . COLACE 50 MG capsule TAKE (1) CAPSULE BY MOUTH TWICE DAILY.  60 capsule  2  . DIOVAN 80 MG tablet TAKE ONE TABLET BY MOUTH ONCE DAILY.  30 tablet  2  . fluticasone (FLONASE) 50 MCG/ACT nasal spray Place 2 sprays into the nose daily.      . furosemide (LASIX) 40 MG tablet TAKE 1 TABLET BY MOUTH ONCE DAILY.  30 tablet  2  . metoprolol (LOPRESSOR) 50 MG tablet TAKE 1& 1/2 TABLETS EACH MORNING.  75 tablet  2  . polyethylene glycol powder (GLYCOLAX/MIRALAX) powder Take 17 g by mouth daily as needed (for constipation).      . potassium chloride (K-DUR) 10 MEQ tablet TAKE (1) TABLET BY MOUTH ON TUESDAYS, THURSDAYS,  SATURDAYS, AND SUNDAYS.  45 tablet  3  . simvastatin (ZOCOR) 20 MG tablet TAKE (1) TABLET BY MOUTH AT BEDTIME FOR CHOLESTEROL.  30 tablet  2  . traMADol (ULTRAM) 50 MG tablet One tablet twice weekly, as needed, for uncontrolled arthritic pain  30 tablet  0  . warfarin (COUMADIN) 1 MG tablet TAKE 1 TABLET BY MOUTH ONCE A DAY,ALONG WITH 2.5 MG DAILY TO EQUAL 3.5 MG.  30 tablet  2  . warfarin (COUMADIN) 2.5 MG tablet TAKE 1 TABLET BY MOUTH ONCE A DAY,ALONG WITH 1 MG TO EQUAL 3.5 MG DAILY.  30 tablet  2  . Wheat Dextrin (BENEFIBER PO) Take 1 each by mouth daily.       No current facility-administered medications for this visit.    Discontinued Meds:   There are no discontinued medications.  Patient Active Problem List   Diagnosis Date Noted  . Cyst of hand 11/20/2012  . Bruise 11/20/2012  . Pain of left hand 11/20/2012  . At high risk for falls 10/09/2012  . Hearing loss 03/14/2012  . Long term (current) use of anticoagulants 03/06/2012  . NSTEMI, initial episode of care 01/05/2012  .  CVA (cerebral infarction) 01/05/2012  . Moderate aortic stenosis 01/05/2012  . Moderate mitral stenosis 01/05/2012  . New onset a-fib 01/05/2012  . CKD (chronic kidney disease) stage 3, GFR 30-59 ml/min 01/05/2012  . Swelling of joint of right shoulder 01/05/2012  . Hemiplegia, unspecified, affecting dominant side 01/05/2012  . Ankle fracture, left 12/07/2011  . Chronic kidney disease 09/30/2011  . Hyperlipidemia   . Hypertension   . Diverticulosis   . Paroxysmal atrial fibrillation   . Osteoporosis 11/06/2009  . ARTHRITIS, KNEES, BILATERAL 11/20/2007  . ANEMIA-IRON DEFICIENCY 04/06/2007    LABS    Component Value Date/Time   NA 138 10/09/2012 1520   NA 138 03/14/2012 1134   NA 134* 01/23/2012 0537   K 4.9 10/09/2012 1520   K 4.9 03/14/2012 1134   K 5.2* 01/23/2012 0537   CL 99 10/09/2012 1520   CL 100 03/14/2012 1134   CL 98 01/23/2012 0537   CO2 33* 10/09/2012 1520   CO2 28 03/14/2012 1134    CO2 26 01/23/2012 0537   GLUCOSE 78 10/09/2012 1520   GLUCOSE 97 03/14/2012 1134   GLUCOSE 131* 01/23/2012 0537   BUN 30* 10/09/2012 1520   BUN 29* 03/14/2012 1134   BUN 28* 01/23/2012 0537   CREATININE 1.48* 10/09/2012 1520   CREATININE 1.48* 03/14/2012 1134   CREATININE 1.38* 01/23/2012 0537   CREATININE 1.32* 01/08/2012 0718   CREATININE 1.21* 01/06/2012 0508   CREATININE 1.29* 11/11/2011 1058   CALCIUM 10.5 10/09/2012 1520   CALCIUM 11.1* 03/14/2012 1134   CALCIUM 10.6* 01/23/2012 0537   GFRNONAA 32* 01/23/2012 0537   GFRNONAA 34* 01/08/2012 0718   GFRNONAA 38* 01/06/2012 0508   GFRAA 37* 01/23/2012 0537   GFRAA 39* 01/08/2012 0718   GFRAA 44* 01/06/2012 0508   CMP     Component Value Date/Time   NA 138 10/09/2012 1520   K 4.9 10/09/2012 1520   CL 99 10/09/2012 1520   CO2 33* 10/09/2012 1520   GLUCOSE 78 10/09/2012 1520   BUN 30* 10/09/2012 1520   CREATININE 1.48* 10/09/2012 1520   CREATININE 1.38* 01/23/2012 0537   CALCIUM 10.5 10/09/2012 1520   PROT 7.1 10/09/2012 1520   ALBUMIN 4.1 10/09/2012 1520   AST 18 10/09/2012 1520   ALT 14 10/09/2012 1520   ALKPHOS 51 10/09/2012 1520   BILITOT 0.7 10/09/2012 1520   GFRNONAA 32* 01/23/2012 0537   GFRAA 37* 01/23/2012 0537       Component Value Date/Time   WBC 4.5 10/09/2012 1520   WBC 3.9* 03/14/2012 1134   WBC 5.7 01/23/2012 0537   HGB 10.5* 10/09/2012 1520   HGB 11.1* 03/14/2012 1134   HGB 10.3* 01/23/2012 0537   HCT 30.6* 10/09/2012 1520   HCT 32.8* 03/14/2012 1134   HCT 32.4* 01/23/2012 0537   MCV 92.7 10/09/2012 1520   MCV 88.9 03/14/2012 1134   MCV 94.2 01/23/2012 0537    Lipid Panel     Component Value Date/Time   CHOL 130 10/09/2012 1520   TRIG 59 10/09/2012 1520   HDL 57 10/09/2012 1520   CHOLHDL 2.3 10/09/2012 1520   VLDL 12 10/09/2012 1520   LDLCALC 61 10/09/2012 1520    ABG No results found for this basename: phart, pco2, pco2art, po2, po2art, hco3, tco2, acidbasedef, o2sat     Lab Results  Component Value  Date   TSH 1.081 10/09/2012   BNP (last 3 results) No results found for this basename: PROBNP,  in the last 8760 hours  Cardiac Panel (last 3 results) No results found for this basename: CKTOTAL, CKMB, TROPONINI, RELINDX,  in the last 72 hours  Iron/TIBC/Ferritin    Component Value Date/Time   IRON 116 11/09/2009 0922     EKG Orders placed in visit on 07/06/12  . EKG 12-LEAD     Prior Assessment and Plan Problem List as of 02/06/2013     Cardiovascular and Mediastinum   Hypertension   Last Assessment & Plan   01/31/2013 Office Visit Written 02/01/2013  9:16 AM by Kerri Perches, MD     Controlled, no change in medication     Paroxysmal atrial fibrillation   NSTEMI, initial episode of care   Moderate aortic stenosis   Last Assessment & Plan   07/06/2012 Office Visit Written 07/06/2012  1:23 PM by Jodelle Gross, NP     She is asymptomatic for chest pain or dyspnea on exertion. She is very sedentary secondary to history of CVA using a wheelchair for ambulation. No plans for repeat echo at this time, she would be a poor candidate for aortic valve replacement or repair secondary to age and comorbidities.    Moderate mitral stenosis   New onset a-fib   Last Assessment & Plan   07/06/2012 Office Visit Written 07/06/2012  1:22 PM by Jodelle Gross, NP     This is become chronic for her. Heart rate is well-controlled currently on metoprolol 50 mg one half tablets twice a day. She will continue in our [place] office Coumadin clinic. No changes to current medication regimen.      Digestive   Diverticulosis     Nervous and Auditory   CVA (cerebral infarction)   Last Assessment & Plan   10/09/2012 Office Visit Written 10/14/2012  5:31 PM by Kerri Perches, MD     Residual hemiparesis, needs increased CAP hours    Hemiplegia, unspecified, affecting dominant side   Last Assessment & Plan   01/31/2013 Office Visit Written 02/01/2013  9:20 AM by Kerri Perches, MD      Unchanged, follwoig CVA    Hearing loss   Last Assessment & Plan   03/14/2012 Office Visit Written 04/08/2012  2:42 PM by Kerri Perches, MD     C/o deterioation in hearing and requests referral for eval and management      Musculoskeletal and Integument   ARTHRITIS, KNEES, BILATERAL   Last Assessment & Plan   01/31/2013 Office Visit Written 02/01/2013  9:17 AM by Kerri Perches, MD     Tramadol as needed, for uncontrolled pain, use expected to be one tablet twice weekly    Osteoporosis   Last Assessment & Plan   10/09/2012 Office Visit Written 10/14/2012  5:32 PM by Kerri Perches, MD     Continue weekly fosamax    Ankle fracture, left   Last Assessment & Plan   12/07/2011 Office Visit Written 12/07/2011  7:51 PM by Kerri Perches, MD     Recent trauma to ankle referred to orthopedics    Swelling of joint of right shoulder   Cyst of hand   Last Assessment & Plan   11/20/2012 Office Visit Written 11/23/2012  9:00 AM by Kerri Perches, MD     Refer tpo ortho for eval, no intervention anticipated as being necessary, second opinion only, since reported as new, and there is surrounding bruise      Genitourinary   Chronic kidney disease   CKD (chronic kidney disease) stage  3, GFR 30-59 ml/min   Last Assessment & Plan   01/31/2013 Office Visit Written 02/01/2013  9:18 AM by Kerri Perches, MD     Pt to avoid nSAID      Other   [facility] DEFICIENCY   Last Assessment & Plan   09/05/2011 Office Visit Written 09/05/2011 12:08 PM by Kathlen Brunswick, MD     Patient has had a long-standing mild to moderate anemia, the cause of which is undiagnosed as far as I know.  CBC will be repeated.    Hyperlipidemia   Last Assessment & Plan   01/31/2013 Office Visit Written 02/01/2013  9:18 AM by Kerri Perches, MD     Controlled, no change in medication Hyperlipidemia:Low fat diet discussed and encouraged.  Updated lab next visit    Long term (current) use of  anticoagulants   Last Assessment & Plan   01/31/2013 Office Visit Written 02/01/2013  9:19 AM by Kerri Perches, MD     Fol,lowed in coumadin clinic, reports recent unilateral epistaxis x 2 , no interest in ENT eval at this time, resolved with pressure    At high risk for falls   Last Assessment & Plan   01/31/2013 Office Visit Written 02/01/2013  9:20 AM by Kerri Perches, MD     Home safety again discussec with her caregiver, no report of falls this year    Bruise   Last Assessment & Plan   11/20/2012 Office Visit Written 11/23/2012  8:59 AM by Kerri Perches, MD     New x 5 days, unaware of trauma that caused this, cautioned re risk of bleeding due to chronic anticoagullation Xray hand today    Pain of left hand       Imaging: No results found.

## 2013-02-07 LAB — BASIC METABOLIC PANEL
CO2: 28 mEq/L (ref 19–32)
Calcium: 10.5 mg/dL (ref 8.4–10.5)
Creat: 1.51 mg/dL — ABNORMAL HIGH (ref 0.50–1.10)
Glucose, Bld: 91 mg/dL (ref 70–99)
Sodium: 134 mEq/L — ABNORMAL LOW (ref 135–145)

## 2013-02-08 MED ORDER — FUROSEMIDE 20 MG PO TABS: ORAL_TABLET | ORAL | Status: DC

## 2013-02-08 NOTE — Addendum Note (Signed)
Addended by: Thompson Grayer on: 02/08/2013 10:46 AM   Modules accepted: Orders

## 2013-02-20 ENCOUNTER — Encounter: Payer: Self-pay | Admitting: *Deleted

## 2013-02-25 ENCOUNTER — Other Ambulatory Visit: Payer: Self-pay | Admitting: Family Medicine

## 2013-02-25 ENCOUNTER — Telehealth: Payer: Self-pay | Admitting: Adult Health

## 2013-02-25 DIAGNOSIS — I5042 Chronic combined systolic (congestive) and diastolic (congestive) heart failure: Secondary | ICD-10-CM

## 2013-02-25 HISTORY — DX: Chronic combined systolic (congestive) and diastolic (congestive) heart failure: I50.42

## 2013-02-25 NOTE — Telephone Encounter (Signed)
Spoke to pt sitter and she stated that the pt's right knee and ankle is swollen and red. Pt sitter states that she keep her legs propped up during the day. Pt sitter also stated that this started after they decreased the fluid pill.  Pt takes 20 mg of Lasix daily. Pt is unable to take BP at home she has a nurse that comes in and checks that regularly. Please advise.

## 2013-02-25 NOTE — Telephone Encounter (Signed)
Please have PT/INR drawn ASAP, and called to [facility]. If swelling persists, please have her seen in ER for X-ray and or ultrasound to evaluate bleeding vs ongoing arthritis in knee vs septic arthritis. Needs evaluation by PCP ASAP as well.

## 2013-02-25 NOTE — Telephone Encounter (Signed)
Thank you :)

## 2013-02-25 NOTE — Telephone Encounter (Signed)
Called pt with no answer and no voicemail .will try back

## 2013-02-25 NOTE — Telephone Encounter (Signed)
Spoke to nurse at pt house. Advised her that we made an appt for coumadin at the [facility] office for PT/INR check at 11:00 am and an appt with Dr Purvis Sheffield with swelling. Pt's nurse was also advised that if the swelling persists to go to the ER.

## 2013-02-25 NOTE — Telephone Encounter (Signed)
Pt has swelling in right leg and foot and it is red. Please return call. / tgs

## 2013-02-26 ENCOUNTER — Other Ambulatory Visit: Payer: Self-pay | Admitting: Family Medicine

## 2013-02-26 ENCOUNTER — Ambulatory Visit (INDEPENDENT_AMBULATORY_CARE_PROVIDER_SITE_OTHER): Payer: PRIVATE HEALTH INSURANCE | Admitting: *Deleted

## 2013-02-26 ENCOUNTER — Encounter: Payer: Self-pay | Admitting: Cardiovascular Disease

## 2013-02-26 ENCOUNTER — Ambulatory Visit (HOSPITAL_COMMUNITY)
Admission: RE | Admit: 2013-02-26 | Discharge: 2013-02-26 | Disposition: A | Discharge status: Home / Self Care | Payer: PRIVATE HEALTH INSURANCE | Source: Ambulatory Visit | Attending: Cardiovascular Disease | Admitting: Cardiovascular Disease

## 2013-02-26 ENCOUNTER — Ambulatory Visit (INDEPENDENT_AMBULATORY_CARE_PROVIDER_SITE_OTHER): Payer: PRIVATE HEALTH INSURANCE | Admitting: Cardiovascular Disease

## 2013-02-26 VITALS — BP 163/85 | HR 78 | Ht 63.5 in | Wt 140.0 lb

## 2013-02-26 DIAGNOSIS — I480 Paroxysmal atrial fibrillation: Secondary | ICD-10-CM

## 2013-02-26 DIAGNOSIS — M7989 Other specified soft tissue disorders: Secondary | ICD-10-CM | POA: Insufficient documentation

## 2013-02-26 DIAGNOSIS — R609 Edema, unspecified: Secondary | ICD-10-CM

## 2013-02-26 DIAGNOSIS — I639 Cerebral infarction, unspecified: Secondary | ICD-10-CM

## 2013-02-26 DIAGNOSIS — Z7901 Long term (current) use of anticoagulants: Secondary | ICD-10-CM

## 2013-02-26 DIAGNOSIS — E782 Mixed hyperlipidemia: Secondary | ICD-10-CM

## 2013-02-26 DIAGNOSIS — D509 Iron deficiency anemia, unspecified: Secondary | ICD-10-CM

## 2013-02-26 DIAGNOSIS — M25579 Pain in unspecified ankle and joints of unspecified foot: Secondary | ICD-10-CM | POA: Insufficient documentation

## 2013-02-26 DIAGNOSIS — I6350 Cerebral infarction due to unspecified occlusion or stenosis of unspecified cerebral artery: Secondary | ICD-10-CM

## 2013-02-26 DIAGNOSIS — I38 Endocarditis, valve unspecified: Secondary | ICD-10-CM

## 2013-02-26 DIAGNOSIS — I4891 Unspecified atrial fibrillation: Secondary | ICD-10-CM

## 2013-02-26 DIAGNOSIS — I350 Nonrheumatic aortic (valve) stenosis: Secondary | ICD-10-CM

## 2013-02-26 DIAGNOSIS — I359 Nonrheumatic aortic valve disorder, unspecified: Secondary | ICD-10-CM

## 2013-02-26 DIAGNOSIS — I050 Rheumatic mitral stenosis: Secondary | ICD-10-CM

## 2013-02-26 DIAGNOSIS — I10 Essential (primary) hypertension: Secondary | ICD-10-CM

## 2013-02-26 MED ORDER — VALSARTAN 160 MG PO TABS: 160.0000 mg | ORAL_TABLET | Freq: Every day | ORAL | Status: DC

## 2013-02-26 MED ORDER — FUROSEMIDE 40 MG PO TABS: 40.0000 mg | ORAL_TABLET | Freq: Every day | ORAL | Status: DC

## 2013-02-26 NOTE — Patient Instructions (Signed)
   Increase Lasix to 40mg  daily   Increase Diovan to 160mg  daily  New script sent to pharmacy on above   Right lower extremity doppler today - [facility]  Lab for BMET - due in 1 week  Office will contact with results via phone or letter. Follow up in  7-10 days

## 2013-02-26 NOTE — Progress Notes (Signed)
Patient ID: Judy Grant, female   DOB: 02-21-1918, 79 y.o.   MRN: [id number]      SUBJECTIVE: The patient is a 79 year old woman with a past medical history significant for rheumatic valvular heart disease with both aortic and mitral stenosis, paroxysmal atrial fibrillation, CVA, hypertension, iron deficiency anemia and hyperlipidemia. An echocardiogram performed on 01-06-12 showed moderate AS and mild to moderate MS. She also has bilateral knee arthritis. On 02/08/13, her Na was 134, BUN 32, creatinine 1.51, and her maintenance dose of Lasix 40 mg daily was reduced to 20 mg daily due to concerns for mild dehydration by Joni Reining, NP. Shortly after this, she began to develop right leg swelling and discomfort. Her INR was 4.6 on 11/12. She denies chest pain, shortness of breath, and palpitations.     No Known Allergies  Current Outpatient Prescriptions  Medication Sig Dispense Refill  . alendronate (FOSAMAX) 70 MG tablet TAKE 1 TAB EACH WEEK 30 MIN PRIOR TO BREAKFAST WITH LARGE GLASS OF WATER. REMAIN UPRIGHT.  4 tablet  1  . allopurinol (ZYLOPRIM) 100 MG tablet TAKE ONE TABLET BY MOUTH ONCE DAILY.  30 tablet  2  . ASPIRIN LOW DOSE 81 MG EC tablet TAKE ONE TABLET BY MOUTH ONCE DAILY.  30 tablet  4  . Calcium Carb-Cholecalciferol 500-400 MG-UNIT TABS TAKE (1) TABLET BY MOUTH (3) TIMES DAILY.  90 tablet  2  . DIOVAN 80 MG tablet TAKE ONE TABLET BY MOUTH ONCE DAILY.  30 tablet  4  . fluticasone (FLONASE) 50 MCG/ACT nasal spray Place 2 sprays into the nose daily.      . furosemide (LASIX) 20 MG tablet TAKE 1 TABLET BY MOUTH ONCE DAILY.  30 tablet  2  . HYDROcodone-acetaminophen (NORCO/VICODIN) 5-325 MG per tablet Take 1 tablet by mouth every 4 (four) hours as needed.      . metoprolol (LOPRESSOR) 50 MG tablet TAKE 1 1/2 TABLETS BY MOUTH IN THE MORNING, & 1 TABLET DAILY AT BEDTIME.  75 tablet  4  . polyethylene glycol powder (GLYCOLAX/MIRALAX) powder Take 17 g by mouth daily as  needed (for constipation).      . potassium chloride (K-DUR) 10 MEQ tablet TAKE (1) TABLET BY MOUTH ON TUESDAYS, THURSDAYS, SATURDAYS, AND SUNDAYS.  45 tablet  3  . simvastatin (ZOCOR) 20 MG tablet TAKE (1) TABLET BY MOUTH AT BEDTIME FOR CHOLESTEROL.  30 tablet  4  . traMADol (ULTRAM) 50 MG tablet One tablet twice weekly, as needed, for uncontrolled arthritic pain  30 tablet  0  . warfarin (COUMADIN) 1 MG tablet TAKE 1 TABLET BY MOUTH ONCE A DAY,ALONG WITH 2.5 MG DAILY TO EQUAL 3.5 MG.  30 tablet  2  . warfarin (COUMADIN) 2.5 MG tablet TAKE 1 TABLET BY MOUTH ONCE A DAY,ALONG WITH 1 MG TO EQUAL 3.5 MG DAILY.  30 tablet  2  . Wheat Dextrin (BENEFIBER PO) Take 1 each by mouth daily.      Marland Kitchen COLACE 50 MG capsule TAKE (1) CAPSULE BY MOUTH TWICE DAILY.  60 capsule  4   No current facility-administered medications for this visit.    Past Medical History  Diagnosis Date  . Hyperlipidemia   . Osteoporosis   . Hypertension     with severe left ventricular hypertrophy; normal ejection fraction in 04/2005  . Valvular heart disease     Mild to moderate aortic stenosis; moderate to severe mitral stenosis in 2007; mild MR  . Paroxysmal atrial fibrillation  Remote  . Diverticulosis     Pancolonic; h/o LGI bleeding and diverticulitis  . Degenerative joint disease     Knees  . Chronic kidney disease     Mild; creatinine of 1.19-1.28 in recent years  . Anemia, iron deficiency   . Popliteal cyst     Left  . Fracture of wrist 2008    left  . Gout   . Stroke     Past Surgical History  Procedure Laterality Date  . Appendectomy  1944  . Total hip arthroplasty  1994    History   Social History  . Marital Status: Widowed    Spouse Name: N/A    Number of Children: 3  . Years of Education: N/A   Occupational History  . Retired     Marine scientist, forming   Social History Main Topics  . Smoking status: Former Smoker    Quit date: 08/31/1972  . Smokeless tobacco: Never Used  . Alcohol Use: No    . Drug Use: No  . Sexual Activity: Not on file   Other Topics Concern  . Not on file   Social History Narrative  . No narrative on file     Filed Vitals:   02/26/13 1048  Height: 5' 3.5" (1.613 m)  Weight: 140 lb (63.504 kg)   BP 163/85  Pulse 78   PHYSICAL EXAM General: NAD Neck: No JVD, no thyromegaly or thyroid nodule.  Lungs: Clear to auscultation bilaterally with normal respiratory effort. CV: Nondisplaced PMI.  Heart regular S1/S2, no S3/S4, harsh IV/VI late-peaking crescendo-decrescendo systolic murmur. Unable to appreciate murmur of mitral stenosis.  2-3+ right lower extremity edema extending below the knee to dorsum of foot.  No carotid bruit.  Normal pedal pulses.  Abdomen: Soft, nontender, no hepatosplenomegaly, no distention.  Neurologic: Alert and oriented x 3.  Psych: Normal affect. Extremities: No clubbing or cyanosis.   ECG: reviewed and available in electronic records.      ASSESSMENT AND PLAN: 1. Right lower extremity swelling: will check INR now to see if level is subtherapeutic. Concern for DVT so will proceed with lower extremity venous Doppler. Will also increase Lasix back to 40 mg daily and check a BMET in one week. 2. Valvular heart disease (moderate aortic stenosis and mild to moderate mitral stenosis): this appears to be stable. However, given her right leg swelling, will increase Lasix and optimize BP control. 3. HTN: uncontrolled. Will increase Diovan to 160 mg daily. 4. Paroxysmal atrial fibrillation: remote history. Currently in a regular rhythm. On warfarin. 5. H/o CVA: on warfarin. 6. Hyperlipidemia: on simvastatin. 7. CKD: will check BMET after resuming prior Lasix dose of 40 mg daily.  Prentice Docker, M.D., F.A.C.C.

## 2013-02-26 NOTE — Addendum Note (Signed)
Addended by: Lesle Chris on: 02/26/2013 12:15 PM   Modules accepted: Orders

## 2013-03-04 ENCOUNTER — Inpatient Hospital Stay (HOSPITAL_COMMUNITY): Payer: PRIVATE HEALTH INSURANCE

## 2013-03-04 ENCOUNTER — Emergency Department (HOSPITAL_COMMUNITY): Payer: PRIVATE HEALTH INSURANCE

## 2013-03-04 ENCOUNTER — Encounter: Payer: Self-pay | Admitting: Cardiovascular Disease

## 2013-03-04 ENCOUNTER — Inpatient Hospital Stay (HOSPITAL_COMMUNITY)
Admission: EM | Admit: 2013-03-04 | Discharge: 2013-03-08 | DRG: 553 | Disposition: A | Discharge status: Home Health | Payer: PRIVATE HEALTH INSURANCE | Attending: Internal Medicine | Admitting: Internal Medicine

## 2013-03-04 ENCOUNTER — Encounter (HOSPITAL_COMMUNITY): Payer: Self-pay | Admitting: Emergency Medicine

## 2013-03-04 ENCOUNTER — Ambulatory Visit (INDEPENDENT_AMBULATORY_CARE_PROVIDER_SITE_OTHER): Payer: PRIVATE HEALTH INSURANCE | Admitting: Cardiovascular Disease

## 2013-03-04 VITALS — BP 138/80 | HR 84 | Ht 63.5 in | Wt 139.0 lb

## 2013-03-04 DIAGNOSIS — M810 Age-related osteoporosis without current pathological fracture: Secondary | ICD-10-CM

## 2013-03-04 DIAGNOSIS — I38 Endocarditis, valve unspecified: Secondary | ICD-10-CM | POA: Insufficient documentation

## 2013-03-04 DIAGNOSIS — L0291 Cutaneous abscess, unspecified: Secondary | ICD-10-CM | POA: Diagnosis present

## 2013-03-04 DIAGNOSIS — M25411 Effusion, right shoulder: Secondary | ICD-10-CM

## 2013-03-04 DIAGNOSIS — I639 Cerebral infarction, unspecified: Secondary | ICD-10-CM

## 2013-03-04 DIAGNOSIS — T380X5A Adverse effect of glucocorticoids and synthetic analogues, initial encounter: Secondary | ICD-10-CM | POA: Diagnosis not present

## 2013-03-04 DIAGNOSIS — I480 Paroxysmal atrial fibrillation: Secondary | ICD-10-CM

## 2013-03-04 DIAGNOSIS — D509 Iron deficiency anemia, unspecified: Secondary | ICD-10-CM

## 2013-03-04 DIAGNOSIS — N189 Chronic kidney disease, unspecified: Secondary | ICD-10-CM

## 2013-03-04 DIAGNOSIS — N183 Chronic kidney disease, stage 3 unspecified: Secondary | ICD-10-CM

## 2013-03-04 DIAGNOSIS — M1990 Unspecified osteoarthritis, unspecified site: Secondary | ICD-10-CM | POA: Diagnosis present

## 2013-03-04 DIAGNOSIS — R609 Edema, unspecified: Secondary | ICD-10-CM

## 2013-03-04 DIAGNOSIS — Z87891 Personal history of nicotine dependence: Secondary | ICD-10-CM

## 2013-03-04 DIAGNOSIS — E785 Hyperlipidemia, unspecified: Secondary | ICD-10-CM

## 2013-03-04 DIAGNOSIS — M109 Gout, unspecified: Principal | ICD-10-CM | POA: Diagnosis present

## 2013-03-04 DIAGNOSIS — I5033 Acute on chronic diastolic (congestive) heart failure: Secondary | ICD-10-CM

## 2013-03-04 DIAGNOSIS — M7989 Other specified soft tissue disorders: Secondary | ICD-10-CM

## 2013-03-04 DIAGNOSIS — G8190 Hemiplegia, unspecified affecting unspecified side: Secondary | ICD-10-CM

## 2013-03-04 DIAGNOSIS — Z9181 History of falling: Secondary | ICD-10-CM

## 2013-03-04 DIAGNOSIS — I509 Heart failure, unspecified: Secondary | ICD-10-CM | POA: Diagnosis present

## 2013-03-04 DIAGNOSIS — R7309 Other abnormal glucose: Secondary | ICD-10-CM | POA: Diagnosis not present

## 2013-03-04 DIAGNOSIS — I359 Nonrheumatic aortic valve disorder, unspecified: Secondary | ICD-10-CM

## 2013-03-04 DIAGNOSIS — Z8673 Personal history of transient ischemic attack (TIA), and cerebral infarction without residual deficits: Secondary | ICD-10-CM

## 2013-03-04 DIAGNOSIS — R600 Localized edema: Secondary | ICD-10-CM

## 2013-03-04 DIAGNOSIS — K5790 Diverticulosis of intestine, part unspecified, without perforation or abscess without bleeding: Secondary | ICD-10-CM

## 2013-03-04 DIAGNOSIS — M25476 Effusion, unspecified foot: Secondary | ICD-10-CM | POA: Diagnosis present

## 2013-03-04 DIAGNOSIS — I4891 Unspecified atrial fibrillation: Secondary | ICD-10-CM

## 2013-03-04 DIAGNOSIS — Z833 Family history of diabetes mellitus: Secondary | ICD-10-CM

## 2013-03-04 DIAGNOSIS — I050 Rheumatic mitral stenosis: Secondary | ICD-10-CM

## 2013-03-04 DIAGNOSIS — M129 Arthropathy, unspecified: Secondary | ICD-10-CM

## 2013-03-04 DIAGNOSIS — I5031 Acute diastolic (congestive) heart failure: Secondary | ICD-10-CM

## 2013-03-04 DIAGNOSIS — M79642 Pain in left hand: Secondary | ICD-10-CM

## 2013-03-04 DIAGNOSIS — L0390 Cellulitis, unspecified: Secondary | ICD-10-CM

## 2013-03-04 DIAGNOSIS — Z96649 Presence of unspecified artificial hip joint: Secondary | ICD-10-CM

## 2013-03-04 DIAGNOSIS — I350 Nonrheumatic aortic (valve) stenosis: Secondary | ICD-10-CM

## 2013-03-04 DIAGNOSIS — I10 Essential (primary) hypertension: Secondary | ICD-10-CM

## 2013-03-04 DIAGNOSIS — M25473 Effusion, unspecified ankle: Secondary | ICD-10-CM | POA: Diagnosis present

## 2013-03-04 DIAGNOSIS — D638 Anemia in other chronic diseases classified elsewhere: Secondary | ICD-10-CM | POA: Diagnosis present

## 2013-03-04 DIAGNOSIS — Z7901 Long term (current) use of anticoagulants: Secondary | ICD-10-CM

## 2013-03-04 DIAGNOSIS — I214 Non-ST elevation (NSTEMI) myocardial infarction: Secondary | ICD-10-CM

## 2013-03-04 DIAGNOSIS — E782 Mixed hyperlipidemia: Secondary | ICD-10-CM

## 2013-03-04 DIAGNOSIS — I129 Hypertensive chronic kidney disease with stage 1 through stage 4 chronic kidney disease, or unspecified chronic kidney disease: Secondary | ICD-10-CM | POA: Diagnosis present

## 2013-03-04 DIAGNOSIS — T148XXA Other injury of unspecified body region, initial encounter: Secondary | ICD-10-CM

## 2013-03-04 DIAGNOSIS — R739 Hyperglycemia, unspecified: Secondary | ICD-10-CM

## 2013-03-04 DIAGNOSIS — IMO0002 Reserved for concepts with insufficient information to code with codable children: Secondary | ICD-10-CM

## 2013-03-04 LAB — CBC WITH DIFFERENTIAL/PLATELET
Basophils Relative: 1 % (ref 0–1)
Eosinophils Absolute: 0.1 10*3/uL (ref 0.0–0.7)
Eosinophils Relative: 1 % (ref 0–5)
Lymphs Abs: 1.3 10*3/uL (ref 0.7–4.0)
MCH: 31.4 pg (ref 26.0–34.0)
MCHC: 32.1 g/dL (ref 30.0–36.0)
MCV: 97.9 fL (ref 78.0–100.0)
Neutrophils Relative %: 66 % (ref 43–77)
Platelets: 235 10*3/uL (ref 150–400)
RBC: 3.34 MIL/uL — ABNORMAL LOW (ref 3.87–5.11)
RDW: 13.5 % (ref 11.5–15.5)

## 2013-03-04 LAB — BASIC METABOLIC PANEL
CO2: 28 mEq/L (ref 19–32)
Calcium: 10.9 mg/dL — ABNORMAL HIGH (ref 8.4–10.5)
GFR calc Af Amer: 40 mL/min — ABNORMAL LOW (ref >90)
GFR calc non Af Amer: 35 mL/min — ABNORMAL LOW (ref >90)
Glucose, Bld: 94 mg/dL (ref 70–99)
Potassium: 4.8 mEq/L (ref 3.5–5.1)
Sodium: 136 mEq/L (ref 135–145)

## 2013-03-04 LAB — HEPATIC FUNCTION PANEL
AST: 18 U/L (ref 0–37)
Albumin: 3.9 g/dL (ref 3.5–5.2)
Alkaline Phosphatase: 63 U/L (ref 39–117)
Bilirubin, Direct: 0.2 mg/dL (ref 0.0–0.3)
Total Bilirubin: 1 mg/dL (ref 0.3–1.2)

## 2013-03-04 LAB — PROTIME-INR
INR: 2.8 — ABNORMAL HIGH (ref 0.00–1.49)
Prothrombin Time: 28.5 seconds — ABNORMAL HIGH (ref 11.6–15.2)

## 2013-03-04 LAB — URIC ACID: Uric Acid, Serum: 4.7 mg/dL (ref 2.4–7.0)

## 2013-03-04 MED ORDER — ONDANSETRON HCL 4 MG/2ML IJ SOLN: 4.0000 mg | Freq: Four times a day (QID) | INTRAMUSCULAR | Status: DC | PRN

## 2013-03-04 MED ORDER — SIMVASTATIN 20 MG PO TABS
20.0000 mg | ORAL_TABLET | Freq: Every day | ORAL | Status: DC
  Administered 2013-03-04 – 2013-03-07 (×4): 20 mg via ORAL
  Filled 2013-03-04 (×4): qty 1

## 2013-03-04 MED ORDER — COLCHICINE 0.6 MG PO TABS
0.6000 mg | ORAL_TABLET | Freq: Two times a day (BID) | ORAL | Status: DC
  Administered 2013-03-04 – 2013-03-06 (×4): 0.6 mg via ORAL
  Filled 2013-03-04 (×4): qty 1

## 2013-03-04 MED ORDER — METOPROLOL TARTRATE 50 MG PO TABS
50.0000 mg | ORAL_TABLET | Freq: Every day | ORAL | Status: DC
  Administered 2013-03-04 – 2013-03-07 (×4): 50 mg via ORAL
  Filled 2013-03-04 (×5): qty 1

## 2013-03-04 MED ORDER — HYDROCODONE-ACETAMINOPHEN 5-325 MG PO TABS
1.0000 | ORAL_TABLET | ORAL | Status: DC | PRN
  Administered 2013-03-05 – 2013-03-07 (×4): 1 via ORAL
  Filled 2013-03-04 (×4): qty 1

## 2013-03-04 MED ORDER — ASPIRIN EC 81 MG PO TBEC
81.0000 mg | DELAYED_RELEASE_TABLET | Freq: Every day | ORAL | Status: DC
  Administered 2013-03-04 – 2013-03-08 (×5): 81 mg via ORAL
  Filled 2013-03-04 (×5): qty 1

## 2013-03-04 MED ORDER — SODIUM CHLORIDE 0.9 % IJ SOLN
3.0000 mL | Freq: Two times a day (BID) | INTRAMUSCULAR | Status: DC
  Administered 2013-03-06 – 2013-03-08 (×3): 3 mL via INTRAVENOUS

## 2013-03-04 MED ORDER — SODIUM CHLORIDE 0.9 % IV SOLN
INTRAVENOUS | Status: DC
  Administered 2013-03-04 – 2013-03-05 (×2): via INTRAVENOUS

## 2013-03-04 MED ORDER — FUROSEMIDE 10 MG/ML IJ SOLN
40.0000 mg | Freq: Two times a day (BID) | INTRAMUSCULAR | Status: AC
  Administered 2013-03-04 – 2013-03-05 (×3): 40 mg via INTRAVENOUS
  Filled 2013-03-04 (×3): qty 4

## 2013-03-04 MED ORDER — VANCOMYCIN HCL IN DEXTROSE 1-5 GM/200ML-% IV SOLN
1000.0000 mg | Freq: Once | INTRAVENOUS | Status: AC
  Administered 2013-03-04: 1000 mg via INTRAVENOUS
  Filled 2013-03-04: qty 200

## 2013-03-04 MED ORDER — POLYETHYLENE GLYCOL 3350 17 G PO PACK: 17.0000 g | PACK | Freq: Every day | ORAL | Status: DC | PRN

## 2013-03-04 MED ORDER — ENOXAPARIN SODIUM 40 MG/0.4ML ~~LOC~~ SOLN: 40.0000 mg | SUBCUTANEOUS | Status: DC

## 2013-03-04 MED ORDER — VANCOMYCIN HCL 500 MG IV SOLR
500.0000 mg | INTRAVENOUS | Status: DC
  Administered 2013-03-06 – 2013-03-07 (×3): 500 mg via INTRAVENOUS
  Filled 2013-03-04 (×6): qty 500

## 2013-03-04 MED ORDER — POTASSIUM CHLORIDE ER 10 MEQ PO TBCR
10.0000 meq | EXTENDED_RELEASE_TABLET | Freq: Once | ORAL | Status: AC
  Administered 2013-03-04: 10 meq via ORAL
  Filled 2013-03-04 (×2): qty 1

## 2013-03-04 MED ORDER — SODIUM CHLORIDE 0.9 % IV SOLN
INTRAVENOUS | Status: DC
  Administered 2013-03-04: 13:00:00 via INTRAVENOUS

## 2013-03-04 MED ORDER — FLUTICASONE PROPIONATE 50 MCG/ACT NA SUSP
2.0000 | Freq: Every day | NASAL | Status: DC
  Administered 2013-03-05 – 2013-03-08 (×4): 2 via NASAL
  Filled 2013-03-04: qty 16

## 2013-03-04 MED ORDER — WARFARIN SODIUM 2.5 MG PO TABS
3.5000 mg | ORAL_TABLET | Freq: Once | ORAL | Status: AC
  Administered 2013-03-04: 19:00:00 3.5 mg via ORAL
  Filled 2013-03-04 (×3): qty 1

## 2013-03-04 MED ORDER — ALLOPURINOL 100 MG PO TABS
100.0000 mg | ORAL_TABLET | Freq: Every day | ORAL | Status: DC
  Administered 2013-03-04 – 2013-03-08 (×5): 100 mg via ORAL
  Filled 2013-03-04 (×5): qty 1

## 2013-03-04 MED ORDER — ONDANSETRON HCL 4 MG PO TABS: 4.0000 mg | ORAL_TABLET | Freq: Four times a day (QID) | ORAL | Status: DC | PRN

## 2013-03-04 MED ORDER — TRAZODONE HCL 50 MG PO TABS: 25.0000 mg | ORAL_TABLET | Freq: Every evening | ORAL | Status: DC | PRN

## 2013-03-04 MED ORDER — ALUM & MAG HYDROXIDE-SIMETH 200-200-20 MG/5ML PO SUSP: 30.0000 mL | Freq: Four times a day (QID) | ORAL | Status: DC | PRN

## 2013-03-04 MED ORDER — DOCUSATE SODIUM 50 MG PO CAPS
50.0000 mg | ORAL_CAPSULE | Freq: Two times a day (BID) | ORAL | Status: DC
  Filled 2013-03-04 (×4): qty 1

## 2013-03-04 MED ORDER — MORPHINE SULFATE 2 MG/ML IJ SOLN
1.0000 mg | Freq: Once | INTRAMUSCULAR | Status: AC
  Administered 2013-03-04: 1 mg via INTRAVENOUS
  Filled 2013-03-04: qty 1

## 2013-03-04 MED ORDER — METOPROLOL TARTRATE 25 MG PO TABS
75.0000 mg | ORAL_TABLET | Freq: Every morning | ORAL | Status: DC
  Administered 2013-03-05 – 2013-03-08 (×4): 75 mg via ORAL
  Filled 2013-03-04 (×8): qty 1

## 2013-03-04 MED ORDER — IRBESARTAN 150 MG PO TABS
150.0000 mg | ORAL_TABLET | Freq: Every day | ORAL | Status: DC
  Administered 2013-03-04 – 2013-03-08 (×5): 150 mg via ORAL
  Filled 2013-03-04 (×5): qty 1

## 2013-03-04 MED ORDER — POLYETHYLENE GLYCOL 3350 17 GM/SCOOP PO POWD: 17.0000 g | Freq: Every day | ORAL | Status: DC | PRN

## 2013-03-04 MED ORDER — WARFARIN - PHARMACIST DOSING INPATIENT
Status: DC
  Administered 2013-03-06: 16:00:00

## 2013-03-04 MED ORDER — ACETAMINOPHEN 650 MG RE SUPP: 650.0000 mg | Freq: Four times a day (QID) | RECTAL | Status: DC | PRN

## 2013-03-04 MED ORDER — ACETAMINOPHEN 325 MG PO TABS: 650.0000 mg | ORAL_TABLET | Freq: Four times a day (QID) | ORAL | Status: DC | PRN

## 2013-03-04 NOTE — ED Notes (Signed)
Pt reports progressively increasing pain and swelling in right foot and lower leg. Pt was seen by PCP this morning and told to come to ED for lower extremity doppler. Pt has hx of CHF and gout. Pt denies chest pain, SOB.

## 2013-03-04 NOTE — ED Notes (Signed)
Patient sent by Dr Purvis Sheffield for CHF and gout flare up. Dr Purvis Sheffield requesting patient have lower extremity doppler. Patient denies any shortness of breath but has swelling in both lower extremities (swelling more prominent in right lower extremity).

## 2013-03-04 NOTE — Progress Notes (Signed)
ANTICOAGULATION CONSULT NOTE - Initial Consult  Pharmacy Consult for Coumadin Indication: atrial fibrillation  No Known Allergies  Patient Measurements: Height: 5\' 3"  (160 cm) Weight: 130 lb (58.968 kg) IBW/kg (Calculated) : 52.4  Vital Signs: Temp: 97.5 F (36.4 C) (12/08 1205) Temp src: Oral (12/08 1205) BP: 168/92 mmHg (12/08 1426) Pulse Rate: 82 (12/08 1426)  Labs:  Recent Labs  03/04/13 1243 03/04/13 1255  HGB 10.5*  --   HCT 32.7*  --   PLT 235  --   LABPROT  --  28.5*  INR  --  2.80*  CREATININE 1.28*  --   TROPONINI  --  <0.30    Estimated Creatinine Clearance: 22.7 ml/min (by C-G formula based on Cr of 1.28).   Medical History: Past Medical History  Diagnosis Date  . Hyperlipidemia   . Osteoporosis   . Hypertension     with severe left ventricular hypertrophy; normal ejection fraction in 04/2005  . Valvular heart disease     Mild to moderate aortic stenosis; moderate to severe mitral stenosis in 2007; mild MR  . Paroxysmal atrial fibrillation     Remote  . Diverticulosis     Pancolonic; h/o LGI bleeding and diverticulitis  . Degenerative joint disease     Knees  . Chronic kidney disease     Mild; creatinine of 1.19-1.28 in recent years  . Anemia, iron deficiency   . Popliteal cyst     Left  . Fracture of wrist 2008    left  . Gout   . Stroke     Medications:  Scheduled:    Assessment: 79 yo F on chronic Coumadin 3.5mg  daily for Afib.   INR is therapeutic on admission.  **Note per outpatient Coumadin clinic records Coumadin dose was decreased to 3.5mg  daily except 1mg  on Tues last week.  No bleeding noted.     Goal of Therapy:  INR 2-3   Plan:  Coumadin 3.5mg  po x1 today Daily INR  Judy Grant, [facility] 03/04/2013,4:51 PM

## 2013-03-04 NOTE — H&P (Signed)
[facility] History and Physical  TRUST CRAGO UJW:[id number] DOB: 17-Jul-1917 DOA: 03/04/2013  Referring physician:  PCP: [name], MD  Specialists:   Chief Complaint: Lower extremity edema  HPI: Judy Grant is a delightful 79 y.o. female with a past medical history that includes A. fib, valvular heart disease, hypertension, hyperlipidemia, chronic kidney disease stage III, iron deficiency anemia, gout presents to the emergency room from her cardiologist office with the chief complaint of right lower extremity edema. Information is obtained from the patient. She indicates that her right leg has been slowly swelling over the last 2 weeks or so. She indicates that 2-3 days ago the right ankle became red and inflamed and very painful. She indicates that she does use a walker with ambulation but over the last 2 days the pain in her right ankle was so severe she was unable to bear weight and has been using a wheelchair. She denies fever chills recent falls or trauma to the area. She does have a history of gout but is unable to remember when she had her last gout attack. She denies any chest pain palpitations shortness of breath headache dizziness visual disturbances. She states that Dr. Hilda Lias did draw some fluid off of her knee several months ago. Workup in the emergency room yields a hemoglobin of 10.5 creatinine of 1.2 INR of 2.8. Bilateral venous Dopplers negative for DVT. Does note a stable left popliteal Baker's cyst on the left. Chest x-ray yields no active disease. Similar degree of cardiomegaly with left atrial enlargement and pulmonary vascular congestion without overt edema. Vital signs significant for a blood pressure of 168/92 heart rate of 82. EKG shows atrial fibrillation with no acute changes.  Review of Systems: 10 point review of systems completed and all systems are negative except as indicated in the history of present illness appear  Past Medical History   Diagnosis Date  . Hyperlipidemia   . Osteoporosis   . Hypertension     with severe left ventricular hypertrophy; normal ejection fraction in 04/2005  . Valvular heart disease     Mild to moderate aortic stenosis; moderate to severe mitral stenosis in 2007; mild MR  . Paroxysmal atrial fibrillation     Remote  . Diverticulosis     Pancolonic; h/o LGI bleeding and diverticulitis  . Degenerative joint disease     Knees  . Chronic kidney disease     Mild; creatinine of 1.19-1.28 in recent years  . Anemia, iron deficiency   . Popliteal cyst     Left  . Fracture of wrist 2008    left  . Gout   . Stroke    Past Surgical History  Procedure Laterality Date  . Appendectomy  1944  . Total hip arthroplasty  1994   Social History:  reports that she quit smoking about 40 years ago. Her smoking use included Cigarettes. She smoked 0.00 packs per day. She has never used smokeless tobacco. She reports that she does not drink alcohol or use illicit drugs. No Known Allergies Family medical history is reviewed and noncontributory to the admission of this elderly lady. Of note she is the primary caregiver for her 7 year old disabled son. She does have some help for several hours each day. Daughter lives in [place] will be assisting with son's care while patient in the hospital  Family History  Problem Relation Age of Onset  . Diabetes Mother    family medical history reviewed noncontributory to the admission of this  elderly lady  Prior to Admission medications   Medication Sig Start Date End Date Taking? Authorizing Provider  alendronate (FOSAMAX) 70 MG tablet TAKE 1 TAB EACH WEEK 30 MIN PRIOR TO BREAKFAST WITH LARGE GLASS OF WATER. REMAIN UPRIGHT. 01/30/13  Yes Kerri Perches, MD  allopurinol (ZYLOPRIM) 100 MG tablet TAKE ONE TABLET BY MOUTH ONCE DAILY. 12/27/12  Yes Kerri Perches, MD  ASPIRIN LOW DOSE 81 MG EC tablet TAKE ONE TABLET BY MOUTH ONCE DAILY. 02/25/13  Yes Kerri Perches, MD  Calcium Carb-Cholecalciferol 500-400 MG-UNIT TABS TAKE (1) TABLET BY MOUTH (3) TIMES DAILY. 02/26/13  Yes Kerri Perches, MD  COLACE 50 MG capsule TAKE (1) CAPSULE BY MOUTH TWICE DAILY. 02/25/13  Yes Kerri Perches, MD  fluticasone (FLONASE) 50 MCG/ACT nasal spray Place 2 sprays into the nose daily.   Yes Historical Provider, MD  furosemide (LASIX) 40 MG tablet Take 1 tablet (40 mg total) by mouth daily. 02/26/13  Yes Laqueta Linden, MD  HYDROcodone-acetaminophen (NORCO/VICODIN) 5-325 MG per tablet Take 1 tablet by mouth every 4 (four) hours as needed for moderate pain.   Yes Historical Provider, MD  metoprolol (LOPRESSOR) 50 MG tablet TAKE 1 1/2 TABLETS BY MOUTH IN THE MORNING, & 1 TABLET DAILY AT BEDTIME. 02/25/13  Yes Kerri Perches, MD  polyethylene glycol powder (GLYCOLAX/MIRALAX) powder Take 17 g by mouth daily as needed (for constipation).   Yes Historical Provider, MD  potassium chloride (K-DUR) 10 MEQ tablet TAKE (1) TABLET BY MOUTH ON TUESDAYS, THURSDAYS, SATURDAYS, AND SUNDAYS. 12/27/12  Yes Jodelle Gross, NP  simvastatin (ZOCOR) 20 MG tablet TAKE (1) TABLET BY MOUTH AT BEDTIME FOR CHOLESTEROL. 02/25/13  Yes Kerri Perches, MD  traMADol Janean Sark) 50 MG tablet One tablet twice weekly, as needed, for uncontrolled arthritic pain 01/31/13 05/31/13 Yes Kerri Perches, MD  valsartan (DIOVAN) 160 MG tablet Take 1 tablet (160 mg total) by mouth daily. 02/26/13  Yes Laqueta Linden, MD  warfarin (COUMADIN) 1 MG tablet TAKE 1 TABLET BY MOUTH ONCE A DAY,ALONG WITH 2.5 MG DAILY TO EQUAL 3.5 MG. 01/21/13  Yes Jodelle Gross, NP  warfarin (COUMADIN) 2.5 MG tablet TAKE 1 TABLET BY MOUTH ONCE A DAY,ALONG WITH 1 MG TO EQUAL 3.5 MG DAILY. 01/21/13  Yes Jodelle Gross, NP  Wheat Dextrin (BENEFIBER PO) Take 1 each by mouth daily.   Yes Historical Provider, MD   Physical Exam: Filed Vitals:   03/04/13 1426  BP: 168/92  Pulse: 82  Temp:   Resp: 18      General:  Well-nourished calm comfortable  Eyes: PERRLA, EOMI, no scleral icterus  ENT: Ears clear nose without drainage oropharynx without erythema or exudate. Mucous membranes of her mouth are moist and pink. She left her dentures at home  Neck: Supple no JVD full range of motion no lymphadenopathy  Cardiovascular: Irregular +murmur, no gallop no rub. Right lower extremity with 2-3+ pitting edema from foot to mid shin and 1-2+ from mid shin to knee. Trace lower extremity edema on left.  Respiratory: Normal effort breath sounds are clear bilaterally. I hear no wheezing or crackles  Abdomen: Soft positive bowel sounds nontender to palpation no mass organomegaly noted  Skin: Warm and dry no rashes or lesions appreciated  Musculoskeletal: Right ankle with erythema particularly medial aspect warm to touch very tender to touch no induration.  Psychiatric: Calm cooperative  Neurologic: Cranial nerves II through XII grossly intact speech clear facial symmetry  Labs on Admission:  Basic Metabolic Panel:  Recent Labs Lab 03/04/13 1243  NA 136  K 4.8  CL 96  CO2 28  GLUCOSE 94  BUN 22  CREATININE 1.28*  CALCIUM 10.9*   Liver Function Tests:  Recent Labs Lab 03/04/13 1255  AST 18  ALT 11  ALKPHOS 63  BILITOT 1.0  PROT 8.3  ALBUMIN 3.9   No results found for this basename: LIPASE, AMYLASE,  in the last 168 hours No results found for this basename: AMMONIA,  in the last 168 hours CBC:  Recent Labs Lab 03/04/13 1243  WBC 5.6  NEUTROABS 3.7  HGB 10.5*  HCT 32.7*  MCV 97.9  PLT 235   Cardiac Enzymes:  Recent Labs Lab 03/04/13 1255  TROPONINI <0.30    BNP (last 3 results)  Recent Labs  03/04/13 1243  PROBNP 4971.0*   CBG: No results found for this basename: GLUCAP,  in the last 168 hours  Radiological Exams on Admission: US Venous Img Lower Bilateral  03/04/2013   CLINICAL DATA:  Congestive heart failure, edema, swelling  EXAM: BILATERAL  LOWER EXTREMITY VENOUS DOPPLER ULTRASOUND  TECHNIQUE: Gray-scale sonography with graded compression, as well as color Doppler and duplex ultrasound, were performed to evaluate the deep venous system from the level of the common femoral vein through the popliteal and proximal calf veins. Spectral Doppler was utilized to evaluate flow at rest and with distal augmentation maneuvers.  COMPARISON:  07/20/2006  FINDINGS: Thrombus within deep veins:  None visualized.  Compressibility of deep veins:  Normal.  Duplex waveform respiratory phasicity:  Normal.  Duplex waveform response to augmentation:  Normal.  Venous reflux:  None visualized.  Other findings: Diffuse subcutaneous edema noted in both extremities. Within the left popliteal fossa, there is a complex Baker cyst measuring 4.5 x 1.1 x 2.5 cm. This was present on the prior study and appears stable.  IMPRESSION: Negative for DVT in either extremity  Stable left popliteal Bakers cyst  Subcutaneous peripheral edema bilaterally   Electronically Signed   By: Ruel Favors M.D.   On: 03/04/2013 14:32   Dg Chest Portable 1 View  03/04/2013   CLINICAL DATA:  Congestive heart failure, gout  EXAM: PORTABLE CHEST - 1 VIEW  COMPARISON:  Prior chest x-ray 01/23/2012  FINDINGS: Enlargement of the cardiopericardial silhouette with probable left atrial enlargement. Atherosclerotic calcifications noted in the transverse aorta. Mild pulmonary vascular congestion without overt edema. No large pleural effusion. No pneumothorax. No acute osseous abnormality. Moderately severe bilateral glenohumeral joint osteoarthritis.  IMPRESSION: 1. No active disease. 2. Similar degree of cardiomegaly with left atrial enlargement. 3. Pulmonary vascular congestion without overt edema.   Electronically Signed   By: Malachy Moan M.D.   On: 03/04/2013 12:51    EKG: Independently reviewed. Atrial  Assessment/Plan Principal Problem: Lower extremity edema: Predominately on right. Will admit  to telemetry. Bilateral venous Dopplers negative for DVT. Of note chart review indicates patient seen by her cardiologist several weeks ago and Lasix was decreased from 40 daily to 20 daily due to concern for dehydration. Shortly thereafter she began to develop right leg swelling and discomfort. She was seen one week ago and her Lasix was increased to 40mg  with continuing lower extremity edema. Some concern for acute gout flare and/or cellulitis along with some degree of diastolic heart failure. Will provide antibiotics and IV Lasix. Will obtain x-ray of that right ankle and an updated 2-D echo. Of note chart review indicates that her  last echo was in 2013 which showed moderate AS and mild to moderate MS. Will also monitor intake and output and obtain daily weights. Patient is currently afebrile with a normal white count nontoxic appearing     Active Problems: Gout flare: Patient on sure when she had her last gout flare. Will continue the allopurinol. Will call to same.a voiding NSAIDS at this point due to her chronic kidney disease. Provide pain meds   Cellulitis. Of concern on physical exam. However patient is afebrile with a normal white count. Will obtain x-ray of the right ankle. Will continue antibiotics were initiated in the emergency room for now. CKD (chronic kidney disease) stage 3, GFR 30-59 ml/min: Chart review indicates her current admission is close to baseline. Will monitor closely given diuretic  Paroxysmal atrial fibrillation: Rate controlled. INR is 2.8. Will request Coumadin per pharmacy. Monitor on telemetry. I medications include Lopressor, Diovan will continue these.    Hyperlipidemia: We'll continue statin    Hypertension: Early control. Will continue home medications and monitor          Code Status: full Family Communication: nephew at bedside Disposition Plan: home when ready  Time spent: 65 minutes  [facility] M [facility]  If 7PM-7AM, please contact  night-coverage [email or web address] Password [facility] 03/04/2013, 3:41 PM

## 2013-03-04 NOTE — Progress Notes (Signed)
ANTIBIOTIC CONSULT NOTE - INITIAL  Pharmacy Consult for Vancomycin Indication: cellulitis  No Known Allergies  Patient Measurements: Height: 5\' 3"  (160 cm) Weight: 130 lb (58.968 kg) IBW/kg (Calculated) : 52.4  Vital Signs: Temp: 97.5 F (36.4 C) (12/08 1205) Temp src: Oral (12/08 1205) BP: 168/92 mmHg (12/08 1426) Pulse Rate: 82 (12/08 1426) Intake/Output from previous day:   Intake/Output from this shift:    Labs:  Recent Labs  03/04/13 1243  WBC 5.6  HGB 10.5*  PLT 235  CREATININE 1.28*   Estimated Creatinine Clearance: 22.7 ml/min (by C-G formula based on Cr of 1.28). No results found for this basename: VANCOTROUGH, VANCOPEAK, VANCORANDOM, GENTTROUGH, GENTPEAK, GENTRANDOM, TOBRATROUGH, TOBRAPEAK, TOBRARND, AMIKACINPEAK, AMIKACINTROU, AMIKACIN,  in the last 72 hours   Microbiology: No results found for this or any previous visit (from the past 720 hour(s)).  Medical History: Past Medical History  Diagnosis Date  . Hyperlipidemia   . Osteoporosis   . Hypertension     with severe left ventricular hypertrophy; normal ejection fraction in 04/2005  . Valvular heart disease     Mild to moderate aortic stenosis; moderate to severe mitral stenosis in 2007; mild MR  . Paroxysmal atrial fibrillation     Remote  . Diverticulosis     Pancolonic; h/o LGI bleeding and diverticulitis  . Degenerative joint disease     Knees  . Chronic kidney disease     Mild; creatinine of 1.19-1.28 in recent years  . Anemia, iron deficiency   . Popliteal cyst     Left  . Fracture of wrist 2008    left  . Gout   . Stroke     Medications:  Scheduled:    Assessment: 79 yo F who presents with red, inflamed, RLE edema.  She is starting on empiric antibiotics for cellulitis/ ?osteomyelitis.  Xray is pending.  Patient is afebrile with normal WBC.   Renal function is at patient's baseline.  Given patient's advanced age & low body weight her estimated CrCl is still on 20-25 ml/min.    Vancomycin 12/8>>  Goal of Therapy:  Vancomycin trough level 15-20 mcg/ml (until osteo ruled out)  Plan:  Vancomycin 500mg  IV q24h Check Vancomycin trough at steady state Monitor renal function and cx data.  F/U Xray and pt progress.   Elson Clan 03/04/2013,4:42 PM

## 2013-03-04 NOTE — H&P (Signed)
Patient seen and examined.  Above note reviewed.  Patient has been admitted with swelling and pain to her right ankle.  She describes these symptoms for the last few days.  She has been having problems with bilateral lower extremity edema that is being managed by her cardiologist for the last several weeks.  She has not had fever, leukocytosis.  Her right foot is tender, erythematous and symptoms appear to be localized to right ankle.  She does have a history of gout and is on allopurinol, but cannot remember that last time she had a gout attack.  Xray ankle was negative as was venous dopplers for DVT.  Will check uric acid.  I suspect her symptoms are related to gout.  She has been started on colchicine as well as vancomycin empirically.  Keep legs elevated.  Will change diuretics to IV since she does have significant bilateral lower extremity edema.  I am doubtful that an infectious process is playing a significant role here.  If uric acid is low/normal, may need to consider orthopedic input.  Her renal function appears to be at baseline at this time.  Will continue to follow.  If not improved with colchicine, may need to consider steroids.  Would avoid NSAIDs due to her CKD.  MEMON,JEHANZEB

## 2013-03-04 NOTE — ED Provider Notes (Signed)
CSN: [id number]     Arrival date & time 03/04/13  1158 History   First MD Initiated Contact with Patient 03/04/13 1244     This chart was scribed for Judy Jakes, MD by Ellin Mayhew, ED Scribe. This patient was seen in room APA09/APA09 and the patient's care was started at 12:45 PM.  Chief Complaint  Patient presents with  . Congestive Heart Failure  . Gout   Patient is a 79 y.o. female presenting with leg pain. The history is provided by the patient. No language interpreter was used.  Leg Pain Location:  Leg and foot Injury: no   Leg location:  R lower leg Foot location:  R foot Pain details:    Quality:  Aching   Radiates to:  Does not radiate   Onset quality:  Gradual   Timing:  Constant   Progression:  Worsening Chronicity:  Recurrent Associated symptoms: no back pain, no fever and no neck pain    HPI Comments: Judy Grant is a 79 y.o. female who presents to the Emergency Department complaining of RLE swelling with associated redness and warmth. She has a history of CHF and was at the Cardiologist prior to arriving at the ED. Cardiologist believes that the symptoms could be gout but wanted a lower extremity doppler. She has movement intact in her right leg. She is afebrile, no visual changes or related illness.    Past Medical History  Diagnosis Date  . Hyperlipidemia   . Osteoporosis   . Hypertension     with severe left ventricular hypertrophy; normal ejection fraction in 04/2005  . Valvular heart disease     Mild to moderate aortic stenosis; moderate to severe mitral stenosis in 2007; mild MR  . Paroxysmal atrial fibrillation     Remote  . Diverticulosis     Pancolonic; h/o LGI bleeding and diverticulitis  . Degenerative joint disease     Knees  . Chronic kidney disease     Mild; creatinine of 1.19-1.28 in recent years  . Anemia, iron deficiency   . Popliteal cyst     Left  . Fracture of wrist 2008    left  . Gout   . Stroke    Past Surgical  History  Procedure Laterality Date  . Appendectomy  1944  . Total hip arthroplasty  1994   Family History  Problem Relation Age of Onset  . Diabetes Mother    History  Substance Use Topics  . Smoking status: Former Smoker    Types: Cigarettes    Quit date: 08/31/1972  . Smokeless tobacco: Never Used  . Alcohol Use: No   OB History   Grav Para Term Preterm Abortions TAB SAB Ect Mult Living                 Review of Systems  Constitutional: Negative for fever and chills.  HENT: Negative for congestion and sore throat.   Eyes: Negative for visual disturbance.  Respiratory: Negative for cough.   Cardiovascular: Positive for leg swelling (Right leg and foot swelling).  Gastrointestinal: Negative for nausea, vomiting and diarrhea.  Genitourinary: Negative for dysuria.  Musculoskeletal: Negative for back pain and neck pain.  Skin: Positive for color change (Erythema). Negative for rash.  Hematological: Does not bruise/bleed easily.  Psychiatric/Behavioral: Negative for confusion.    Allergies  Review of patient's allergies indicates no known allergies.  Home Medications   Current Outpatient Rx  Name  Route  Sig  Dispense  Refill  . alendronate (FOSAMAX) 70 MG tablet      TAKE 1 TAB EACH WEEK 30 MIN PRIOR TO BREAKFAST WITH LARGE GLASS OF WATER. REMAIN UPRIGHT.   4 tablet   1   . allopurinol (ZYLOPRIM) 100 MG tablet      TAKE ONE TABLET BY MOUTH ONCE DAILY.   30 tablet   2   . ASPIRIN LOW DOSE 81 MG EC tablet      TAKE ONE TABLET BY MOUTH ONCE DAILY.   30 tablet   4   . Calcium Carb-Cholecalciferol 500-400 MG-UNIT TABS      TAKE (1) TABLET BY MOUTH (3) TIMES DAILY.   90 tablet   3   . COLACE 50 MG capsule      TAKE (1) CAPSULE BY MOUTH TWICE DAILY.   60 capsule   4   . fluticasone (FLONASE) 50 MCG/ACT nasal spray   Nasal   Place 2 sprays into the nose daily.         . furosemide (LASIX) 40 MG tablet   Oral   Take 1 tablet (40 mg total) by mouth  daily.   30 tablet   6     Dose increased 02/26/2013   . HYDROcodone-acetaminophen (NORCO/VICODIN) 5-325 MG per tablet   Oral   Take 1 tablet by mouth every 4 (four) hours as needed for moderate pain.         . metoprolol (LOPRESSOR) 50 MG tablet      TAKE 1 1/2 TABLETS BY MOUTH IN THE MORNING, & 1 TABLET DAILY AT BEDTIME.   75 tablet   4   . polyethylene glycol powder (GLYCOLAX/MIRALAX) powder   Oral   Take 17 g by mouth daily as needed (for constipation).         . potassium chloride (K-DUR) 10 MEQ tablet      TAKE (1) TABLET BY MOUTH ON TUESDAYS, THURSDAYS, SATURDAYS, AND SUNDAYS.   45 tablet   3   . simvastatin (ZOCOR) 20 MG tablet      TAKE (1) TABLET BY MOUTH AT BEDTIME FOR CHOLESTEROL.   30 tablet   4   . traMADol (ULTRAM) 50 MG tablet      One tablet twice weekly, as needed, for uncontrolled arthritic pain   30 tablet   0     Thirty tablets to last 4 month   . valsartan (DIOVAN) 160 MG tablet   Oral   Take 1 tablet (160 mg total) by mouth daily.   30 tablet   6     Dose increased 02/26/2013   . warfarin (COUMADIN) 1 MG tablet      TAKE 1 TABLET BY MOUTH ONCE A DAY,ALONG WITH 2.5 MG DAILY TO EQUAL 3.5 MG.   30 tablet   2   . warfarin (COUMADIN) 2.5 MG tablet      TAKE 1 TABLET BY MOUTH ONCE A DAY,ALONG WITH 1 MG TO EQUAL 3.5 MG DAILY.   30 tablet   2   . Wheat Dextrin (BENEFIBER PO)   Oral   Take 1 each by mouth daily.          Triage Vitals: BP 109/88  Pulse 81  Temp(Src) 97.5 F (36.4 C) (Oral)  Resp 16  Ht 5\' 3"  (1.6 m)  Wt 130 lb (58.968 kg)  BMI 23.03 kg/m2  SpO2 100%  Physical Exam  Nursing note and vitals reviewed. Constitutional: She is oriented to person, place, and time. She  appears well-developed and well-nourished. No distress.  HENT:  Head: Normocephalic and atraumatic.  Mouth/Throat: Oropharynx is clear and moist.  MMM  Eyes: Conjunctivae and EOM are normal. Pupils are equal, round, and reactive to light.   Sclera are clear  Neck: Neck supple. No tracheal deviation present.  Cardiovascular: Normal rate and regular rhythm.   Murmur (systolic murmur) heard. Pulses:      Dorsalis pedis pulses are 2+ on the right side, and 2+ on the left side.  Pulmonary/Chest: Effort normal and breath sounds normal. No respiratory distress. She has no wheezes.  Abdominal: Soft. Bowel sounds are normal. She exhibits no distension. There is no tenderness.  Musculoskeletal:  Marked swelling and redness to the right lower leg with increased warmth mostly along the medial aspect. Mild left lower leg swelling. No erythema or warmth.   Lymphadenopathy:    She has no cervical adenopathy.  Neurological: She is alert and oriented to person, place, and time. No cranial nerve deficit.  Pt able to move both sets of fingers and toes  Skin: Skin is warm and dry. No rash noted.  Psychiatric: She has a normal mood and affect. Her behavior is normal.    ED Course  Procedures (including critical care time)  Medications  0.9 %  sodium chloride infusion ( Intravenous New Bag/Given 03/04/13 1323)  vancomycin (VANCOCIN) IVPB 1000 mg/200 mL premix (not administered)  morphine 2 MG/ML injection 1 mg (not administered)   DIAGNOSTIC STUDIES: Oxygen Saturation is 100% on room air, normal by my interpretation.    COORDINATION OF CARE: 12:54 PM-Doppler studies, CXR, EKG ordered. Discussed treatment plan with pt and pt agreed.   Labs Review Labs Reviewed  CBC WITH DIFFERENTIAL - Abnormal; Notable for the following:    RBC 3.34 (*)    Hemoglobin 10.5 (*)    HCT 32.7 (*)    All other components within normal limits  BASIC METABOLIC PANEL - Abnormal; Notable for the following:    Creatinine, Ser 1.28 (*)    Calcium 10.9 (*)    GFR calc non Af Amer 35 (*)    GFR calc Af Amer 40 (*)    All other components within normal limits  PRO B NATRIURETIC PEPTIDE - Abnormal; Notable for the following:    Pro B Natriuretic peptide (BNP)  4971.0 (*)    All other components within normal limits  PROTIME-INR - Abnormal; Notable for the following:    Prothrombin Time 28.5 (*)    INR 2.80 (*)    All other components within normal limits  TROPONIN I  HEPATIC FUNCTION PANEL   Results for orders placed during the hospital encounter of 03/04/13  CBC WITH DIFFERENTIAL      Result Value Range   WBC 5.6  4.0 - 10.5 K/uL   RBC 3.34 (*) 3.87 - 5.11 MIL/uL   Hemoglobin 10.5 (*) 12.0 - 15.0 g/dL   HCT 16.1 (*) 09.6 - 04.5 %   MCV 97.9  78.0 - 100.0 fL   MCH 31.4  26.0 - 34.0 pg   MCHC 32.1  30.0 - 36.0 g/dL   RDW 40.9  81.1 - 91.4 %   Platelets 235  150 - 400 K/uL   Neutrophils Relative % 66  43 - 77 %   Neutro Abs 3.7  1.7 - 7.7 K/uL   Lymphocytes Relative 23  12 - 46 %   Lymphs Abs 1.3  0.7 - 4.0 K/uL   Monocytes Relative 10  3 -  12 %   Monocytes Absolute 0.5  0.1 - 1.0 K/uL   Eosinophils Relative 1  0 - 5 %   Eosinophils Absolute 0.1  0.0 - 0.7 K/uL   Basophils Relative 1  0 - 1 %   Basophils Absolute 0.0  0.0 - 0.1 K/uL  BASIC METABOLIC PANEL      Result Value Range   Sodium 136  135 - 145 mEq/L   Potassium 4.8  3.5 - 5.1 mEq/L   Chloride 96  96 - 112 mEq/L   CO2 28  19 - 32 mEq/L   Glucose, Bld 94  70 - 99 mg/dL   BUN 22  6 - 23 mg/dL   Creatinine, Ser 4.33 (*) 0.50 - 1.10 mg/dL   Calcium 29.5 (*) 8.4 - 10.5 mg/dL   GFR calc non Af Amer 35 (*) >90 mL/min   GFR calc Af Amer 40 (*) >90 mL/min  PRO B NATRIURETIC PEPTIDE      Result Value Range   Pro B Natriuretic peptide (BNP) 4971.0 (*) 0 - 450 pg/mL  PROTIME-INR      Result Value Range   Prothrombin Time 28.5 (*) 11.6 - 15.2 seconds   INR 2.80 (*) 0.00 - 1.49  TROPONIN I      Result Value Range   Troponin I <0.30  <0.30 ng/mL  HEPATIC FUNCTION PANEL      Result Value Range   Total Protein 8.3  6.0 - 8.3 g/dL   Albumin 3.9  3.5 - 5.2 g/dL   AST 18  0 - 37 U/L   ALT 11  0 - 35 U/L   Alkaline Phosphatase 63  39 - 117 U/L   Total Bilirubin 1.0  0.3 - 1.2  mg/dL   Bilirubin, Direct 0.2  0.0 - 0.3 mg/dL   Indirect Bilirubin 0.8  0.3 - 0.9 mg/dL    Imaging Review US Venous Img Lower Bilateral  03/04/2013   CLINICAL DATA:  Congestive heart failure, edema, swelling  EXAM: BILATERAL LOWER EXTREMITY VENOUS DOPPLER ULTRASOUND  TECHNIQUE: Gray-scale sonography with graded compression, as well as color Doppler and duplex ultrasound, were performed to evaluate the deep venous system from the level of the common femoral vein through the popliteal and proximal calf veins. Spectral Doppler was utilized to evaluate flow at rest and with distal augmentation maneuvers.  COMPARISON:  07/20/2006  FINDINGS: Thrombus within deep veins:  None visualized.  Compressibility of deep veins:  Normal.  Duplex waveform respiratory phasicity:  Normal.  Duplex waveform response to augmentation:  Normal.  Venous reflux:  None visualized.  Other findings: Diffuse subcutaneous edema noted in both extremities. Within the left popliteal fossa, there is a complex Baker cyst measuring 4.5 x 1.1 x 2.5 cm. This was present on the prior study and appears stable.  IMPRESSION: Negative for DVT in either extremity  Stable left popliteal Bakers cyst  Subcutaneous peripheral edema bilaterally   Electronically Signed   By: Ruel Favors M.D.   On: 03/04/2013 14:32   Dg Chest Portable 1 View  03/04/2013   CLINICAL DATA:  Congestive heart failure, gout  EXAM: PORTABLE CHEST - 1 VIEW  COMPARISON:  Prior chest x-ray 01/23/2012  FINDINGS: Enlargement of the cardiopericardial silhouette with probable left atrial enlargement. Atherosclerotic calcifications noted in the transverse aorta. Mild pulmonary vascular congestion without overt edema. No large pleural effusion. No pneumothorax. No acute osseous abnormality. Moderately severe bilateral glenohumeral joint osteoarthritis.  IMPRESSION: 1. No active disease. 2. Similar  degree of cardiomegaly with left atrial enlargement. 3. Pulmonary vascular congestion  without overt edema.   Electronically Signed   By: Malachy Moan M.D.   On: 03/04/2013 12:51    EKG Interpretation    Date/Time:  Monday March 04 2013 12:59:51 EST Ventricular Rate:  88 PR Interval:    QRS Duration: 74 QT Interval:  354 QTC Calculation: 428 R Axis:   47 Text Interpretation:  Atrial fibrillation Septal infarct (cited on or before 04-Feb-2010) Abnormal ECG When compared with ECG of 23-Jan-2012 05:16, Atrial fibrillation has replaced Sinus rhythm Questionable change in initial forces of Anterior leads Confirmed by Delise Simenson  MD, Kalla Watson ([id number]) on 03/04/2013 1:44:21 PM            MDM   1. Cellulitis   2. Gout flare    Patient sent from cardiology office for swelling and redness to the right leg and ankle area. This may be a gout flare or could represent cellulitis. Patient is essentially self-supporting. 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 Having difficulty getting around there is some pain associated with this. Clinically this may very well just be a gout flare but cannot complete rule out cellulitis but in face of no fever elevated white count may just be a gout flare. Patient initiated with vancomycin antibiotic. Also workup for potential congestive heart failure without any significant findings. Chest x-ray shows no active disease. Patient's troponin is normal. EKG shows atrial fibrillation. Syncope patient has and patient does have a history of atrial fibrillation she is on Coumadin and her INR is 2.8 which is appropriate.  Patient will require admission we'll discuss with the hospitalist.  I personally performed the services described in this documentation, which was scribed in my presence. The recorded information has been reviewed and is accurate.     Judy Jakes, MD 03/04/13 [phone]

## 2013-03-04 NOTE — Progress Notes (Signed)
Patient ID: Judy Grant, female   DOB: 19-Nov-1917, 79 y.o.   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 An echocardiogram performed on 01-06-12 showed moderate AS and mild to moderate MS. She also has bilateral knee arthritis.  On 02/08/13, her Na was 134, BUN 32, creatinine 1.51, and her maintenance dose of Lasix 40 mg daily was reduced to 20 mg daily due to concerns for mild dehydration by Joni Reining, NP.  Shortly after this, she began to develop right leg swelling and discomfort. Her INR was 4.6 on 11/12.  She denies chest pain, shortness of breath, and palpitations.  She is down to 139 lbs from 140 lbs one week ago. BP was 163/85 last week, now 138/80 mmHg.  However, her right leg swelling has not improved and continues to feel tight, in spite of better BP control and increased dose of diuretic. She now has redness of the ankle. Her lower extremity Doppler was negative for DVT. Her echo showed normal systolic function in 12/2011 (EF 60-65%) with no mention of diastolic dysfunction. She has a h/o gout. She denies fevers/chills.  No Known Allergies  Current Outpatient Prescriptions  Medication Sig Dispense Refill  . alendronate (FOSAMAX) 70 MG tablet TAKE 1 TAB EACH WEEK 30 MIN PRIOR TO BREAKFAST WITH LARGE GLASS OF WATER. REMAIN UPRIGHT.  4 tablet  1  . allopurinol (ZYLOPRIM) 100 MG tablet TAKE ONE TABLET BY MOUTH ONCE DAILY.  30 tablet  2  . ASPIRIN LOW DOSE 81 MG EC tablet TAKE ONE TABLET BY MOUTH ONCE DAILY.  30 tablet  4  . Calcium Carb-Cholecalciferol 500-400 MG-UNIT TABS TAKE (1) TABLET BY MOUTH (3) TIMES DAILY.  90 tablet  3  . COLACE 50 MG capsule TAKE (1) CAPSULE BY MOUTH TWICE DAILY.  60 capsule  4  . fluticasone (FLONASE) 50 MCG/ACT  nasal spray Place 2 sprays into the nose daily.      . furosemide (LASIX) 40 MG tablet Take 1 tablet (40 mg total) by mouth daily.  30 tablet  6  . polyethylene glycol powder (GLYCOLAX/MIRALAX) powder Take 17 g by mouth daily as needed (for constipation).      . potassium chloride (K-DUR) 10 MEQ tablet TAKE (1) TABLET BY MOUTH ON TUESDAYS, THURSDAYS, SATURDAYS, AND SUNDAYS.  45 tablet  3  . simvastatin (ZOCOR) 20 MG tablet TAKE (1) TABLET BY MOUTH AT BEDTIME FOR CHOLESTEROL.  30 tablet  4  . traMADol (ULTRAM) 50 MG tablet One tablet twice weekly, as needed, for uncontrolled arthritic pain  30 tablet  0  . warfarin (COUMADIN) 1 MG tablet TAKE 1 TABLET BY MOUTH ONCE A DAY,ALONG WITH 2.5 MG DAILY TO EQUAL 3.5 MG.  30 tablet  2  . warfarin (COUMADIN) 2.5 MG tablet TAKE 1 TABLET BY MOUTH ONCE A DAY,ALONG WITH 1 MG TO EQUAL 3.5 MG DAILY.  30 tablet  2  . Wheat Dextrin (BENEFIBER PO) Take 1 each by mouth daily.      Marland Kitchen HYDROcodone-acetaminophen (NORCO/VICODIN) 5-325 MG per tablet Take 1 tablet by mouth every 4 (four) hours as needed.      . metoprolol (LOPRESSOR) 50 MG tablet TAKE 1 1/2 TABLETS BY MOUTH IN THE MORNING, & 1 TABLET DAILY AT BEDTIME.  75 tablet  4  . valsartan (DIOVAN) 160  MG tablet Take 1 tablet (160 mg total) by mouth daily.  30 tablet  6   No current facility-administered medications for this visit.    Past Medical History  Diagnosis Date  . Hyperlipidemia   . Osteoporosis   . Hypertension     with severe left ventricular hypertrophy; normal ejection fraction in 04/2005  . Valvular heart disease     Mild to moderate aortic stenosis; moderate to severe mitral stenosis in 2007; mild MR  . Paroxysmal atrial fibrillation     Remote  . Diverticulosis     Pancolonic; h/o LGI bleeding and diverticulitis  . Degenerative joint disease     Knees  . Chronic kidney disease     Mild; creatinine of 1.19-1.28 in recent years  . Anemia, iron deficiency   . Popliteal cyst     Left  .  Fracture of wrist 2008    left  . Gout   . Stroke     Past Surgical History  Procedure Laterality Date  . Appendectomy  1944  . Total hip arthroplasty  1994    History   Social History  . Marital Status: Widowed    Spouse Name: N/A    Number of Children: 3  . Years of Education: N/A   Occupational History  . Retired     Marine scientist, forming   Social History Main Topics  . Smoking status: Former Smoker    Quit date: 08/31/1972  . Smokeless tobacco: Never Used  . Alcohol Use: No  . Drug Use: No  . Sexual Activity: Not on file   Other Topics Concern  . Not on file   Social History Narrative  . No narrative on file     Filed Vitals:   03/04/13 1014  BP: 138/80  Pulse: 84  Height: 5' 3.5" (1.613 m)  Weight: 139 lb (63.05 kg)  SpO2: 99%    PHYSICAL EXAM General: NAD, elderly, sitting in a wheelchair Neck: No JVD, no thyromegaly or thyroid nodule.  Lungs: Clear to auscultation bilaterally with normal respiratory effort.  CV: Nondisplaced PMI. Heart regular S1/S2, no S3/S4, harsh IV/VI late-peaking crescendo-decrescendo systolic murmur. Unable to appreciate murmur of mitral stenosis. 2-3+ right lower extremity edema extending below the knee to dorsum of foot, now with erythema. No carotid bruit.  Abdomen: Soft, nontender, no hepatosplenomegaly, no distention.  Neurologic: Alert and oriented. Psych: Normal affect.  Extremities: No clubbing or cyanosis.    ECG: reviewed and available in electronic records.      ASSESSMENT AND PLAN: 1. Right lower extremity swelling: Lower extremity venous Doppler showed subcutaneous edema and was negative for DVT. INR 3.4 last week. No resolution in spite of increased dose of Lasix to 40 mg daily. She may have an acute gout flare, along with some degree of diastolic heart failure. Given her valvular heart disease, CKD, and elderly age, I think she should be hospitalized for further management with close monitoring of renal  function. Her erythema may be due to gout but would want to avoid the development of a cellulitis. 2. Valvular heart disease (moderate aortic stenosis and mild to moderate mitral stenosis): this appears to be stable. However, given her right leg swelling, she will require both management for gout and likely IV diuretics. Given the significance of her valvular pathology, this would best be accomplished as an inpatient. 3. HTN: controlled on increased dose of Diovan 160 mg daily.  4. Paroxysmal atrial fibrillation: remote history. Currently in a regular rhythm.  On warfarin.  5. H/o CVA: on warfarin.  6. Hyperlipidemia: on simvastatin.  7. CKD: will need to have BMET checked in hospital as she has recently been on  Lasix 40 mg daily (increase from prior dose of 20 mg daily).  Dispo: hospitalize for further management given this patient's elderly age and complex comorbidities.   Prentice Docker, M.D., F.A.C.C.

## 2013-03-05 DIAGNOSIS — I359 Nonrheumatic aortic valve disorder, unspecified: Secondary | ICD-10-CM

## 2013-03-05 LAB — CBC
HCT: 31.4 % — ABNORMAL LOW (ref 36.0–46.0)
Hemoglobin: 9.9 g/dL — ABNORMAL LOW (ref 12.0–15.0)
MCHC: 31.5 g/dL (ref 30.0–36.0)
MCV: 96.9 fL (ref 78.0–100.0)
RDW: 13.5 % (ref 11.5–15.5)
WBC: 6.1 10*3/uL (ref 4.0–10.5)

## 2013-03-05 LAB — BASIC METABOLIC PANEL
BUN: 19 mg/dL (ref 6–23)
CO2: 29 mEq/L (ref 19–32)
Chloride: 99 mEq/L (ref 96–112)
Creatinine, Ser: 1.14 mg/dL — ABNORMAL HIGH (ref 0.50–1.10)
Potassium: 4.4 mEq/L (ref 3.5–5.1)
Sodium: 136 mEq/L (ref 135–145)

## 2013-03-05 LAB — PROTIME-INR
INR: 3.04 — ABNORMAL HIGH (ref 0.00–1.49)
Prothrombin Time: 30.4 seconds — ABNORMAL HIGH (ref 11.6–15.2)

## 2013-03-05 MED ORDER — DOCUSATE SODIUM 50 MG/5ML PO LIQD
50.0000 mg | Freq: Two times a day (BID) | ORAL | Status: DC
  Administered 2013-03-05 – 2013-03-08 (×7): 50 mg via ORAL
  Filled 2013-03-05 (×13): qty 10

## 2013-03-05 MED ORDER — FUROSEMIDE 40 MG PO TABS
40.0000 mg | ORAL_TABLET | Freq: Every day | ORAL | Status: DC
  Administered 2013-03-06 – 2013-03-08 (×3): 40 mg via ORAL
  Filled 2013-03-05 (×3): qty 1

## 2013-03-05 MED ORDER — WARFARIN SODIUM 1 MG PO TABS
1.0000 mg | ORAL_TABLET | Freq: Once | ORAL | Status: AC
  Administered 2013-03-05: 1 mg via ORAL
  Filled 2013-03-05: qty 1

## 2013-03-05 MED ORDER — POTASSIUM CHLORIDE CRYS ER 10 MEQ PO TBCR
10.0000 meq | EXTENDED_RELEASE_TABLET | Freq: Once | ORAL | Status: AC
  Administered 2013-03-05: 10 meq via ORAL
  Filled 2013-03-05: qty 1

## 2013-03-05 NOTE — Progress Notes (Signed)
Utilization Review Complete  

## 2013-03-05 NOTE — Progress Notes (Signed)
Unable to insert foley catheter x 4 attempts by different staff members.

## 2013-03-05 NOTE — Progress Notes (Signed)
[facility] PROGRESS NOTE  Judy Grant WUJ:[id number] DOB: 08/06/1917 DOA: 03/04/2013 PCP: [name], MD  Assessment/Plan: Right lower extremity edema: Suspected related to gout flare however uric acid within limits of normal. Bilateral venous Dopplers negative for DVT. Xray of ankle negative as well. Of note chart review indicates patient seen by her cardiologist several weeks ago and Lasix was decreased from 40 daily to 20 daily due to concern for dehydration. Shortly thereafter she began to develop right leg swelling and discomfort. She was seen one week ago and her Lasix was increased to 40mg  with continuing lower extremity edema. Some concern for acute gout flare and/or cellulitis along with some degree of diastolic heart failure. Left leg much less edematous today. Continue empiric Vanc day #2 and IV Lasix for 1 more dose then transition to home dose 40mg .  Await 2-D echo. Of note chart review indicates that her last echo was in 2013 which showed moderate AS and mild to moderate MS. Volume status -338. Wt 64.1kg up from 58.0kg yesterday.  Patient remains afebrile with a normal white count nontoxic appearing  Active Problems:   Gout flare: Slight improvement today. Uric acid within the limits of normal. Consider ortho consult. Will continue the allopurinol and colchecine. Avoid NSAIDS at this point due to her chronic kidney disease. If no further improvement consider steroids.    Cellulitis. Of concern on physical exam. However patient is afebrile with a normal white count. X-ray of the right ankle neg. Continue empiric vanc day #2 for now.    CKD (chronic kidney disease) stage 3, GFR 30-59 ml/min: Creatinine improved. Continue to monitor   Paroxysmal atrial fibrillation: Rate controlled. INR is 3.04.  Continue Coumadin per pharmacy. Home medications include Lopressor, Diovan will continue these.   Hyperlipidemia: We'll continue statin   Hypertension: only fair  control.  Will continue home medications and monitor   Anemia: hx iron deficiency likely related to chronic disease. Current level likely somewhat dilutional. Will discontinue IV fluids. No s/sx bleeding. Current level close to baseline. Will monitor.    Code Status: full Family Communication: none present Disposition Plan: home when ready   Consultants:  none  Procedures:  none  Antibiotics:  Vancomycin 03/04/13>>>  HPI/Subjective: Sitting up eating breakfast. Reports "leg feels better"  Objective: Filed Vitals:   03/05/13 0448  BP: 147/83  Pulse: 76  Temp: 98.6 F (37 C)  Resp: 18    Intake/Output Summary (Last 24 hours) at 03/05/13 0859 Last data filed at 03/05/13 0733  Gross per 24 hour  Intake 761.67 ml  Output   1350 ml  Net -588.33 ml   Filed Weights   03/04/13 1205 03/04/13 1705 03/05/13 0448  Weight: 58.968 kg (130 lb) 58.08 kg (128 lb 0.7 oz) 64.1 kg (141 lb 5 oz)    Exam:   General:  Calm appears comfortable NAD  Cardiovascular: irregulare +murmur no gallup or rub. Right LE 2+ pitting edema with erythema to medial aspect of ankle. Tender to touch. Left LE much less edematous.   Respiratory: normal effort BS clear bilaterally no crackles no wheeze  Abdomen: soft , flat +BS non-tender to palpation  Musculoskeletal: right ankle with erythema, swelling and tenderness as well as heat. Other joints without swelling/erythema   Data Reviewed: Basic Metabolic Panel:  Recent Labs Lab 03/04/13 1243 03/05/13 0538  NA 136 136  K 4.8 4.4  CL 96 99  CO2 28 29  GLUCOSE 94 104*  BUN 22 19  CREATININE  1.28* 1.14*  CALCIUM 10.9* 9.8   Liver Function Tests:  Recent Labs Lab 03/04/13 1255  AST 18  ALT 11  ALKPHOS 63  BILITOT 1.0  PROT 8.3  ALBUMIN 3.9   No results found for this basename: LIPASE, AMYLASE,  in the last 168 hours No results found for this basename: AMMONIA,  in the last 168 hours CBC:  Recent Labs Lab 03/04/13 1243 03/05/13 0538   WBC 5.6 6.1  NEUTROABS 3.7  --   HGB 10.5* 9.9*  HCT 32.7* 31.4*  MCV 97.9 96.9  PLT 235 201   Cardiac Enzymes:  Recent Labs Lab 03/04/13 1255  TROPONINI <0.30   BNP (last 3 results)  Recent Labs  03/04/13 1243  PROBNP 4971.0*   CBG: No results found for this basename: GLUCAP,  in the last 168 hours  No results found for this or any previous visit (from the past 240 hour(s)).   Studies: Dg Ankle Complete Right  03/04/2013   CLINICAL DATA:  Right ankle swelling, pain and redness.  EXAM: RIGHT ANKLE - COMPLETE 3+ VIEW  COMPARISON:  None.  FINDINGS: Diffuse and marked soft tissue swelling is present about the right ankle. No soft tissue gas collection or radiopaque foreign body is identified. There is no bony destructive change. No fracture or dislocation. Scattered vascular and skin calcifications are noted.  IMPRESSION: Diffuse soft tissue swelling without focal abnormality.   Electronically Signed   By: Drusilla Kanner M.D.   On: 03/04/2013 15:52   US Venous Img Lower Bilateral  03/04/2013   CLINICAL DATA:  Congestive heart failure, edema, swelling  EXAM: BILATERAL LOWER EXTREMITY VENOUS DOPPLER ULTRASOUND  TECHNIQUE: Gray-scale sonography with graded compression, as well as color Doppler and duplex ultrasound, were performed to evaluate the deep venous system from the level of the common femoral vein through the popliteal and proximal calf veins. Spectral Doppler was utilized to evaluate flow at rest and with distal augmentation maneuvers.  COMPARISON:  07/20/2006  FINDINGS: Thrombus within deep veins:  None visualized.  Compressibility of deep veins:  Normal.  Duplex waveform respiratory phasicity:  Normal.  Duplex waveform response to augmentation:  Normal.  Venous reflux:  None visualized.  Other findings: Diffuse subcutaneous edema noted in both extremities. Within the left popliteal fossa, there is a complex Baker cyst measuring 4.5 x 1.1 x 2.5 cm. This was present on the  prior study and appears stable.  IMPRESSION: Negative for DVT in either extremity  Stable left popliteal Bakers cyst  Subcutaneous peripheral edema bilaterally   Electronically Signed   By: Ruel Favors M.D.   On: 03/04/2013 14:32   Dg Chest Portable 1 View  03/04/2013   CLINICAL DATA:  Congestive heart failure, gout  EXAM: PORTABLE CHEST - 1 VIEW  COMPARISON:  Prior chest x-ray 01/23/2012  FINDINGS: Enlargement of the cardiopericardial silhouette with probable left atrial enlargement. Atherosclerotic calcifications noted in the transverse aorta. Mild pulmonary vascular congestion without overt edema. No large pleural effusion. No pneumothorax. No acute osseous abnormality. Moderately severe bilateral glenohumeral joint osteoarthritis.  IMPRESSION: 1. No active disease. 2. Similar degree of cardiomegaly with left atrial enlargement. 3. Pulmonary vascular congestion without overt edema.   Electronically Signed   By: Malachy Moan M.D.   On: 03/04/2013 12:51    Scheduled Meds: . allopurinol  100 mg Oral Daily  . aspirin EC  81 mg Oral Daily  . colchicine  0.6 mg Oral BID  . docusate  50 mg Oral  BID  . fluticasone  2 spray Each Nare Daily  . furosemide  40 mg Intravenous Q12H  . irbesartan  150 mg Oral Daily  . metoprolol tartrate  50 mg Oral QHS  . metoprolol tartrate  75 mg Oral q morning - 10a  . simvastatin  20 mg Oral q1800  . sodium chloride  3 mL Intravenous Q12H  . vancomycin  500 mg Intravenous Q24H  . warfarin  1 mg Oral ONCE-1800  . Warfarin - Pharmacist Dosing Inpatient   Does not apply Q24H   Continuous Infusions: . sodium chloride 50 mL/hr at 03/04/13 1919    Principal Problem:   Lower extremity edema Active Problems:   ANEMIA-IRON DEFICIENCY   Hyperlipidemia   Hypertension   Paroxysmal atrial fibrillation   Chronic kidney disease   CKD (chronic kidney disease) stage 3, GFR 30-59 ml/min   Gout attack   Cellulitis   Gout flare    Time spent: 30  minutes    [facility] M  [facility] Pager [phone]. If 7PM-7AM, please contact night-coverage at [email or web address], password [facility] 03/05/2013, 8:59 AM  LOS: 1 day

## 2013-03-05 NOTE — Consult Note (Signed)
Reason for Consult:right foot pain and ankle pain Referring Physician: Hospitalist  Judy Grant is an 79 y.o. female.  HPI: She has had a week or two of swelling of the right foot and ankle with no trauma.  She has not had redness or discharge or insect bite.  She has history of gout but serum uric acid is normal.  Plain x-rays are negative.  Past Medical History  Diagnosis Date  . Hyperlipidemia   . Osteoporosis   . Hypertension     with severe left ventricular hypertrophy; normal ejection fraction in 04/2005  . Valvular heart disease     Mild to moderate aortic stenosis; moderate to severe mitral stenosis in 2007; mild MR  . Paroxysmal atrial fibrillation     Remote  . Diverticulosis     Pancolonic; h/o LGI bleeding and diverticulitis  . Degenerative joint disease     Knees  . Chronic kidney disease     Mild; creatinine of 1.19-1.28 in recent years  . Anemia, iron deficiency   . Popliteal cyst     Left  . Fracture of wrist 2008    left  . Gout   . Stroke     Past Surgical History  Procedure Laterality Date  . Appendectomy  1944  . Total hip arthroplasty  1994    Family History  Problem Relation Age of Onset  . Diabetes Mother     Social History:  reports that she quit smoking about 40 years ago. Her smoking use included Cigarettes. She smoked 0.00 packs per day. She has never used smokeless tobacco. She reports that she does not drink alcohol or use illicit drugs.  Allergies: No Known Allergies  Medications: I have reviewed the patient's current medications.  Results for orders placed during the hospital encounter of 03/04/13 (from the past 48 hour(s))  CBC WITH DIFFERENTIAL     Status: Abnormal   Collection Time    03/04/13 12:43 PM      Result Value Range   WBC 5.6  4.0 - 10.5 K/uL   RBC 3.34 (*) 3.87 - 5.11 MIL/uL   Hemoglobin 10.5 (*) 12.0 - 15.0 g/dL   HCT 95.6 (*) 21.3 - 08.6 %   MCV 97.9  78.0 - 100.0 fL   MCH 31.4  26.0 - 34.0 pg   MCHC 32.1   30.0 - 36.0 g/dL   RDW 57.8  46.9 - 62.9 %   Platelets 235  150 - 400 K/uL   Neutrophils Relative % 66  43 - 77 %   Neutro Abs 3.7  1.7 - 7.7 K/uL   Lymphocytes Relative 23  12 - 46 %   Lymphs Abs 1.3  0.7 - 4.0 K/uL   Monocytes Relative 10  3 - 12 %   Monocytes Absolute 0.5  0.1 - 1.0 K/uL   Eosinophils Relative 1  0 - 5 %   Eosinophils Absolute 0.1  0.0 - 0.7 K/uL   Basophils Relative 1  0 - 1 %   Basophils Absolute 0.0  0.0 - 0.1 K/uL  BASIC METABOLIC PANEL     Status: Abnormal   Collection Time    03/04/13 12:43 PM      Result Value Range   Sodium 136  135 - 145 mEq/L   Potassium 4.8  3.5 - 5.1 mEq/L   Chloride 96  96 - 112 mEq/L   CO2 28  19 - 32 mEq/L   Glucose, Bld 94  70 -  99 mg/dL   BUN 22  6 - 23 mg/dL   Creatinine, Ser 2.13 (*) 0.50 - 1.10 mg/dL   Calcium 08.6 (*) 8.4 - 10.5 mg/dL   GFR calc non Af Amer 35 (*) >90 mL/min   GFR calc Af Amer 40 (*) >90 mL/min   Comment: (NOTE)     The eGFR has been calculated using the CKD EPI equation.     This calculation has not been validated in all clinical situations.     eGFR's persistently <90 mL/min signify possible Chronic Kidney     Disease.  PRO B NATRIURETIC PEPTIDE     Status: Abnormal   Collection Time    03/04/13 12:43 PM      Result Value Range   Pro B Natriuretic peptide (BNP) 4971.0 (*) 0 - 450 pg/mL  URIC ACID     Status: None   Collection Time    03/04/13 12:43 PM      Result Value Range   Uric Acid, Serum 4.7  2.4 - 7.0 mg/dL  PROTIME-INR     Status: Abnormal   Collection Time    03/04/13 12:55 PM      Result Value Range   Prothrombin Time 28.5 (*) 11.6 - 15.2 seconds   INR 2.80 (*) 0.00 - 1.49  TROPONIN I     Status: None   Collection Time    03/04/13 12:55 PM      Result Value Range   Troponin I <0.30  <0.30 ng/mL   Comment:            Due to the release kinetics of cTnI,     a negative result within the first hours     of the onset of symptoms does not rule out     myocardial infarction with  certainty.     If myocardial infarction is still suspected,     repeat the test at appropriate intervals.  HEPATIC FUNCTION PANEL     Status: None   Collection Time    03/04/13 12:55 PM      Result Value Range   Total Protein 8.3  6.0 - 8.3 g/dL   Albumin 3.9  3.5 - 5.2 g/dL   AST 18  0 - 37 U/L   ALT 11  0 - 35 U/L   Alkaline Phosphatase 63  39 - 117 U/L   Total Bilirubin 1.0  0.3 - 1.2 mg/dL   Bilirubin, Direct 0.2  0.0 - 0.3 mg/dL   Indirect Bilirubin 0.8  0.3 - 0.9 mg/dL  PROTIME-INR     Status: Abnormal   Collection Time    03/05/13  5:38 AM      Result Value Range   Prothrombin Time 30.4 (*) 11.6 - 15.2 seconds   INR 3.04 (*) 0.00 - 1.49  BASIC METABOLIC PANEL     Status: Abnormal   Collection Time    03/05/13  5:38 AM      Result Value Range   Sodium 136  135 - 145 mEq/L   Potassium 4.4  3.5 - 5.1 mEq/L   Chloride 99  96 - 112 mEq/L   CO2 29  19 - 32 mEq/L   Glucose, Bld 104 (*) 70 - 99 mg/dL   BUN 19  6 - 23 mg/dL   Creatinine, Ser 5.78 (*) 0.50 - 1.10 mg/dL   Calcium 9.8  8.4 - 46.9 mg/dL   GFR calc non Af Amer 40 (*) >90 mL/min   GFR  calc Af Amer 47 (*) >90 mL/min   Comment: (NOTE)     The eGFR has been calculated using the CKD EPI equation.     This calculation has not been validated in all clinical situations.     eGFR's persistently <90 mL/min signify possible Chronic Kidney     Disease.  CBC     Status: Abnormal   Collection Time    03/05/13  5:38 AM      Result Value Range   WBC 6.1  4.0 - 10.5 K/uL   RBC 3.24 (*) 3.87 - 5.11 MIL/uL   Hemoglobin 9.9 (*) 12.0 - 15.0 g/dL   HCT 16.1 (*) 09.6 - 04.5 %   MCV 96.9  78.0 - 100.0 fL   MCH 30.6  26.0 - 34.0 pg   MCHC 31.5  30.0 - 36.0 g/dL   RDW 40.9  81.1 - 91.4 %   Platelets 201  150 - 400 K/uL    Dg Ankle Complete Right  03/04/2013   CLINICAL DATA:  Right ankle swelling, pain and redness.  EXAM: RIGHT ANKLE - COMPLETE 3+ VIEW  COMPARISON:  None.  FINDINGS: Diffuse and marked soft tissue swelling is  present about the right ankle. No soft tissue gas collection or radiopaque foreign body is identified. There is no bony destructive change. No fracture or dislocation. Scattered vascular and skin calcifications are noted.  IMPRESSION: Diffuse soft tissue swelling without focal abnormality.   Electronically Signed   By: Drusilla Kanner M.D.   On: 03/04/2013 15:52   US Venous Img Lower Bilateral  03/04/2013   CLINICAL DATA:  Congestive heart failure, edema, swelling  EXAM: BILATERAL LOWER EXTREMITY VENOUS DOPPLER ULTRASOUND  TECHNIQUE: Gray-scale sonography with graded compression, as well as color Doppler and duplex ultrasound, were performed to evaluate the deep venous system from the level of the common femoral vein through the popliteal and proximal calf veins. Spectral Doppler was utilized to evaluate flow at rest and with distal augmentation maneuvers.  COMPARISON:  07/20/2006  FINDINGS: Thrombus within deep veins:  None visualized.  Compressibility of deep veins:  Normal.  Duplex waveform respiratory phasicity:  Normal.  Duplex waveform response to augmentation:  Normal.  Venous reflux:  None visualized.  Other findings: Diffuse subcutaneous edema noted in both extremities. Within the left popliteal fossa, there is a complex Baker cyst measuring 4.5 x 1.1 x 2.5 cm. This was present on the prior study and appears stable.  IMPRESSION: Negative for DVT in either extremity  Stable left popliteal Bakers cyst  Subcutaneous peripheral edema bilaterally   Electronically Signed   By: Ruel Favors M.D.   On: 03/04/2013 14:32   Dg Chest Portable 1 View  03/04/2013   CLINICAL DATA:  Congestive heart failure, gout  EXAM: PORTABLE CHEST - 1 VIEW  COMPARISON:  Prior chest x-ray 01/23/2012  FINDINGS: Enlargement of the cardiopericardial silhouette with probable left atrial enlargement. Atherosclerotic calcifications noted in the transverse aorta. Mild pulmonary vascular congestion without overt edema. No large pleural  effusion. No pneumothorax. No acute osseous abnormality. Moderately severe bilateral glenohumeral joint osteoarthritis.  IMPRESSION: 1. No active disease. 2. Similar degree of cardiomegaly with left atrial enlargement. 3. Pulmonary vascular congestion without overt edema.   Electronically Signed   By: Malachy Moan M.D.   On: 03/04/2013 12:51    Review of Systems  Cardiovascular:       Hypertension  Musculoskeletal: Positive for joint pain (Pain swelling of the right foot and ankle for  a week or more.  Not getting any better.  No history of fall.).       History of gout, arthritis, osteoporosis   Blood pressure 147/83, pulse 76, temperature 98.6 F (37 C), temperature source Oral, resp. rate 18, height 5\' 3"  (1.6 m), weight 64.1 kg (141 lb 5 oz), SpO2 95.00%. Physical Exam  Constitutional: She is oriented to person, place, and time. She appears well-developed and well-nourished.  HENT:  Head: Normocephalic and atraumatic.  Eyes: Conjunctivae and EOM are normal. Pupils are equal, round, and reactive to light.  Neck: Normal range of motion. Neck supple.  Cardiovascular: Normal rate and intact distal pulses.   Respiratory: Effort normal.  GI: Soft.  Musculoskeletal: She exhibits edema (swelling of right ankle and foot) and tenderness (right ankle and foot tender.  Good motion.).  Neurological: She is alert and oriented to person, place, and time. She has normal reflexes.  Skin: Skin is warm and dry.  Psychiatric: She has a normal mood and affect. Her behavior is normal. Judgment and thought content normal.    Assessment/Plan: Right foot and ankle pain.  I have ordered a bone scan to rule out occult fracture.  Continue elevation.  Lilac Hoff 03/05/2013, 12:01 PM

## 2013-03-05 NOTE — Progress Notes (Signed)
Patient seen and examined.  Above note reviewed  She continues to have pain in her right ankle.  It still appears to be erythematous and swollen.  Appreciate orthopedics input.  Will continue with lasix for LE edema and vancomycin for possible cellulitis.  Bone scan has been ordered by ortho to evaluate for occult fractures.  Continue colchicine for now. Renal function improving with diuresis. Anticipate she will be in the hospital another 24-48  Hours.  Mandee Pluta

## 2013-03-05 NOTE — Progress Notes (Signed)
ANTICOAGULATION CONSULT NOTE  Pharmacy Consult for Coumadin Indication: atrial fibrillation  No Known Allergies  Patient Measurements: Height: 5\' 3"  (160 cm) Weight: 141 lb 5 oz (64.1 kg) IBW/kg (Calculated) : 52.4  Vital Signs: Temp: 98.6 F (37 C) (12/09 0448) Temp src: Oral (12/09 0448) BP: 147/83 mmHg (12/09 0448) Pulse Rate: 76 (12/09 0448)  Labs:  Recent Labs  03/04/13 1243 03/04/13 1255 03/05/13 0538  HGB 10.5*  --  9.9*  HCT 32.7*  --  31.4*  PLT 235  --  201  LABPROT  --  28.5* 30.4*  INR  --  2.80* 3.04*  CREATININE 1.28*  --  1.14*  TROPONINI  --  <0.30  --     Estimated Creatinine Clearance: 27.8 ml/min (by C-G formula based on Cr of 1.14).   Medical History: Past Medical History  Diagnosis Date  . Hyperlipidemia   . Osteoporosis   . Hypertension     with severe left ventricular hypertrophy; normal ejection fraction in 04/2005  . Valvular heart disease     Mild to moderate aortic stenosis; moderate to severe mitral stenosis in 2007; mild MR  . Paroxysmal atrial fibrillation     Remote  . Diverticulosis     Pancolonic; h/o LGI bleeding and diverticulitis  . Degenerative joint disease     Knees  . Chronic kidney disease     Mild; creatinine of 1.19-1.28 in recent years  . Anemia, iron deficiency   . Popliteal cyst     Left  . Fracture of wrist 2008    left  . Gout   . Stroke     Medications:  Scheduled:  . allopurinol  100 mg Oral Daily  . aspirin EC  81 mg Oral Daily  . colchicine  0.6 mg Oral BID  . docusate  50 mg Oral BID  . fluticasone  2 spray Each Nare Daily  . furosemide  40 mg Intravenous Q12H  . irbesartan  150 mg Oral Daily  . metoprolol tartrate  50 mg Oral QHS  . metoprolol tartrate  75 mg Oral q morning - 10a  . simvastatin  20 mg Oral q1800  . sodium chloride  3 mL Intravenous Q12H  . vancomycin  500 mg Intravenous Q24H  . Warfarin - Pharmacist Dosing Inpatient   Does not apply Q24H    Assessment: 79 yo F on  chronic Coumadin 3.5mg  daily for Afib.   INR is therapeutic on admission & has trended to upper end of goal range today.   **Note per outpatient Coumadin clinic records Coumadin dose was decreased to 3.5mg  daily except 1mg  on Tues last week.  No bleeding noted.     Goal of Therapy:  INR 2-3   Plan:  Coumadin 1mg  po x1 today (per home dose) Daily INR  Elson Clan 03/05/2013,8:08 AM

## 2013-03-05 NOTE — Progress Notes (Signed)
*  PRELIMINARY RESULTS* Echocardiogram 2D Echocardiogram has been performed.  Judy Grant 03/05/2013, 10:34 AM

## 2013-03-06 ENCOUNTER — Encounter (HOSPITAL_COMMUNITY): Payer: Self-pay

## 2013-03-06 ENCOUNTER — Inpatient Hospital Stay (HOSPITAL_COMMUNITY): Payer: PRIVATE HEALTH INSURANCE

## 2013-03-06 DIAGNOSIS — I5033 Acute on chronic diastolic (congestive) heart failure: Secondary | ICD-10-CM | POA: Diagnosis present

## 2013-03-06 LAB — BASIC METABOLIC PANEL
BUN: 20 mg/dL (ref 6–23)
Chloride: 98 mEq/L (ref 96–112)
Creatinine, Ser: 1.34 mg/dL — ABNORMAL HIGH (ref 0.50–1.10)
GFR calc Af Amer: 38 mL/min — ABNORMAL LOW (ref >90)
Glucose, Bld: 94 mg/dL (ref 70–99)
Potassium: 4 mEq/L (ref 3.5–5.1)

## 2013-03-06 LAB — PROTIME-INR: INR: 2.81 — ABNORMAL HIGH (ref 0.00–1.49)

## 2013-03-06 LAB — CBC
HCT: 31.3 % — ABNORMAL LOW (ref 36.0–46.0)
Hemoglobin: 10 g/dL — ABNORMAL LOW (ref 12.0–15.0)
MCH: 30.8 pg (ref 26.0–34.0)
MCHC: 31.9 g/dL (ref 30.0–36.0)
MCV: 96.3 fL (ref 78.0–100.0)
RDW: 13.5 % (ref 11.5–15.5)
WBC: 6.6 10*3/uL (ref 4.0–10.5)

## 2013-03-06 MED ORDER — TECHNETIUM TC 99M MEDRONATE IV KIT
25.0000 | PACK | Freq: Once | INTRAVENOUS | Status: AC | PRN
  Administered 2013-03-06: 25 via INTRAVENOUS

## 2013-03-06 MED ORDER — COLCHICINE 0.6 MG PO TABS
0.6000 mg | ORAL_TABLET | ORAL | Status: DC
  Administered 2013-03-08: 0.6 mg via ORAL
  Filled 2013-03-06: qty 1

## 2013-03-06 MED ORDER — METHYLPREDNISOLONE SODIUM SUCC 40 MG IJ SOLR
40.0000 mg | Freq: Once | INTRAMUSCULAR | Status: AC
  Administered 2013-03-06: 40 mg via INTRAVENOUS
  Filled 2013-03-06: qty 1

## 2013-03-06 MED ORDER — WARFARIN SODIUM 2.5 MG PO TABS
2.5000 mg | ORAL_TABLET | Freq: Once | ORAL | Status: AC
  Administered 2013-03-06: 2.5 mg via ORAL
  Filled 2013-03-06: qty 1

## 2013-03-06 MED ORDER — PREDNISONE 20 MG PO TABS
20.0000 mg | ORAL_TABLET | Freq: Two times a day (BID) | ORAL | Status: DC
  Administered 2013-03-07 – 2013-03-08 (×3): 20 mg via ORAL
  Filled 2013-03-06 (×3): qty 1

## 2013-03-06 NOTE — Progress Notes (Signed)
[facility] PROGRESS NOTE  Judy Grant EAV:[id number] DOB: 1917-08-05 DOA: 03/04/2013 PCP: [name], MD  Assessment/Plan: Right lower extremity edema: Edema much improved today. Likely multifactorial i.e. Acute on chronic diastolic HF with gout flare and possible cellulitis. Bilateral venous Dopplers negative for DVT. Plain xray of ankle negative as well. Of note chart review indicates patient seen by her cardiologist several weeks ago and Lasix was decreased from 40 daily to 20 daily due to concern for dehydration. Shortly thereafter she began to develop right leg swelling and discomfort. She was seen one week ago and her Lasix was increased to 40mg  with continuing lower extremity edema. Some concern for acute gout flare and/or cellulitis along with some degree of diastolic heart failure. Right ankle continues with erythema/tenderness and heat. Will continue empiric Vanc day #3. Lasix transitioned to home dose of 40mg  today.  2-D echo yields EF 75% with grade 2 diastolic dysfunction as well as moderate AS and moderate MS. Orthopedic consult and bone scan pending as concern for occult right ankle fracture.  Patient remains afebrile with a normal white count nontoxic appearing.  Active Problems:   Acute on chronic diastolic HF. See above. Weight stable 64kg. Volume status -1.4L.  Home meds include metoprolol and lasix. Will continue these. Will arrange for OP follow with cardiologist at discharge.   Gout flare: Slight improvement today. Uric acid within the limits of normal. Appreciate ortho consult. Await bone scan results. Will continue the allopurinol and colchecine. Avoid NSAIDS at this point due to her chronic kidney disease. If no further improvement consider steroids.   Cellulitis. Of concern on physical exam. However patient remains afebrile with a normal white count. Plain x-ray of the right ankle neg. Continue empiric vanc day #3 for now.   CKD (chronic kidney disease)  stage 3, GFR 30-59 ml/min: Creatinine trending up slightly likely related to IV lasix. Creatinine still remains close to baseline according to chart. Have transitioned to po lasix today. Will hold any nephrotoxins. Urine output good.  Continue to monitor   Paroxysmal atrial fibrillation: Rate controlled. INR is 2.81 Continue Coumadin per pharmacy. Home medications include Lopressor, Diovan will continue these.   Hyperlipidemia: We'll continue statin   Hypertension: fair control. Will continue home medications and monitor   Anemia: hx iron deficiency likely related to chronic disease. Current level at baseline. No s/sx bleeding. Will monitor.   Code Status: full Family Communication: none present Disposition Plan: home when ready hopefully tomorrow   Consultants:  Dr. Hilda [facility]ias [facility]  Procedures:  none  Antibiotics:  Vancomycin 03/04/13>>>  HPI/Subjective: Sitting on side of bed eating breakfast. Reports continued pain in right ankle but improved from admission.   Objective: Filed Vitals:   03/06/13 0639  BP: 116/66  Pulse: 76  Temp: 97.6 F (36.4 C)  Resp: 18    Intake/Output Summary (Last 24 hours) at 03/06/13 0852 Last data filed at 03/06/13 0542  Gross per 24 hour  Intake 881.67 ml  Output   1800 ml  Net -918.33 ml   Filed Weights   03/04/13 1705 03/05/13 0448 03/06/13 0500  Weight: 58.08 kg (128 lb 0.7 oz) 64.1 kg (141 lb 5 oz) 64.4 kg (141 lb 15.6 oz)    Exam:   General:  Well nourished, appears comfortable NAD  Cardiovascular: S1 and S2. +murmur. No gallup or rub. Right foot/ankle with edema and erythema other wise no LE edema.  Respiratory: normal effort. BS are clear bilaterally. i hear no wheeze or rhonchi  Abdomen: soft and non-distended. +BS throughout. Non-tender to palpation  Musculoskeletal: right ankle with erythema and swelling particularly medial aspect. Very tender to touch. Warm to touch. Other joint are without erythema or  swelling.    Data Reviewed: Basic Metabolic Panel:  Recent Labs Lab 03/04/13 1243 03/05/13 0538 03/06/13 0604  NA 136 136 136  K 4.8 4.4 4.0  CL 96 99 98  CO2 28 29 28   GLUCOSE 94 104* 94  BUN 22 19 20   CREATININE 1.28* 1.14* 1.34*  CALCIUM 10.9* 9.8 9.4   Liver Function Tests:  Recent Labs Lab 03/04/13 1255  AST 18  ALT 11  ALKPHOS 63  BILITOT 1.0  PROT 8.3  ALBUMIN 3.9   No results found for this basename: LIPASE, AMYLASE,  in the last 168 hours No results found for this basename: AMMONIA,  in the last 168 hours CBC:  Recent Labs Lab 03/04/13 1243 03/05/13 0538 03/06/13 0604  WBC 5.6 6.1 6.6  NEUTROABS 3.7  --   --   HGB 10.5* 9.9* 10.0*  HCT 32.7* 31.4* 31.3*  MCV 97.9 96.9 96.3  PLT 235 201 209   Cardiac Enzymes:  Recent Labs Lab 03/04/13 1255  TROPONINI <0.30   BNP (last 3 results)  Recent Labs  03/04/13 1243  PROBNP 4971.0*   CBG: No results found for this basename: GLUCAP,  in the last 168 hours  No results found for this or any previous visit (from the past 240 hour(s)).   Studies: Dg Ankle Complete Right  03/04/2013   CLINICAL DATA:  Right ankle swelling, pain and redness.  EXAM: RIGHT ANKLE - COMPLETE 3+ VIEW  COMPARISON:  None.  FINDINGS: Diffuse and marked soft tissue swelling is present about the right ankle. No soft tissue gas collection or radiopaque foreign body is identified. There is no bony destructive change. No fracture or dislocation. Scattered vascular and skin calcifications are noted.  IMPRESSION: Diffuse soft tissue swelling without focal abnormality.   Electronically Signed   By: Drusilla Kanner M.D.   On: 03/04/2013 15:52   US Venous Img Lower Bilateral  03/04/2013   CLINICAL DATA:  Congestive heart failure, edema, swelling  EXAM: BILATERAL LOWER EXTREMITY VENOUS DOPPLER ULTRASOUND  TECHNIQUE: Gray-scale sonography with graded compression, as well as color Doppler and duplex ultrasound, were performed to evaluate the  deep venous system from the level of the common femoral vein through the popliteal and proximal calf veins. Spectral Doppler was utilized to evaluate flow at rest and with distal augmentation maneuvers.  COMPARISON:  07/20/2006  FINDINGS: Thrombus within deep veins:  None visualized.  Compressibility of deep veins:  Normal.  Duplex waveform respiratory phasicity:  Normal.  Duplex waveform response to augmentation:  Normal.  Venous reflux:  None visualized.  Other findings: Diffuse subcutaneous edema noted in both extremities. Within the left popliteal fossa, there is a complex Baker cyst measuring 4.5 x 1.1 x 2.5 cm. This was present on the prior study and appears stable.  IMPRESSION: Negative for DVT in either extremity  Stable left popliteal Bakers cyst  Subcutaneous peripheral edema bilaterally   Electronically Signed   By: Ruel Favors M.D.   On: 03/04/2013 14:32   Dg Chest Portable 1 View  03/04/2013   CLINICAL DATA:  Congestive heart failure, gout  EXAM: PORTABLE CHEST - 1 VIEW  COMPARISON:  Prior chest x-ray 01/23/2012  FINDINGS: Enlargement of the cardiopericardial silhouette with probable left atrial enlargement. Atherosclerotic calcifications noted in the transverse aorta. Mild  pulmonary vascular congestion without overt edema. No large pleural effusion. No pneumothorax. No acute osseous abnormality. Moderately severe bilateral glenohumeral joint osteoarthritis.  IMPRESSION: 1. No active disease. 2. Similar degree of cardiomegaly with left atrial enlargement. 3. Pulmonary vascular congestion without overt edema.   Electronically Signed   By: Malachy Moan M.D.   On: 03/04/2013 12:51    Scheduled Meds: . allopurinol  100 mg Oral Daily  . aspirin EC  81 mg Oral Daily  . colchicine  0.6 mg Oral BID  . docusate  50 mg Oral BID  . fluticasone  2 spray Each Nare Daily  . furosemide  40 mg Oral Daily  . irbesartan  150 mg Oral Daily  . metoprolol tartrate  50 mg Oral QHS  . metoprolol tartrate   75 mg Oral q morning - 10a  . simvastatin  20 mg Oral q1800  . sodium chloride  3 mL Intravenous Q12H  . vancomycin  500 mg Intravenous Q24H  . warfarin  2.5 mg Oral Once  . Warfarin - Pharmacist Dosing Inpatient   Does not apply Q24H   Continuous Infusions: . sodium chloride 20 mL/hr at 03/05/13 1613    Principal Problem:   Lower extremity edema Active Problems:   ANEMIA-IRON DEFICIENCY   Hyperlipidemia   Hypertension   Paroxysmal atrial fibrillation   Chronic kidney disease   CKD (chronic kidney disease) stage 3, GFR 30-59 ml/min   Gout attack   Cellulitis   Gout flare    Time spent: 35 minutes    [facility] M  [facility] Pager [phone]. If 7PM-7AM, please contact night-coverage at [email or web address], password [facility] 03/06/2013, 8:52 AM  LOS: 2 days

## 2013-03-06 NOTE — Progress Notes (Signed)
ANTICOAGULATION CONSULT NOTE  Pharmacy Consult for Coumadin Indication: atrial fibrillation  No Known Allergies  Patient Measurements: Height: 5\' 3"  (160 cm) Weight: 141 lb 15.6 oz (64.4 kg) IBW/kg (Calculated) : 52.4  Vital Signs: Temp: 97.6 F (36.4 C) (12/10 0639) BP: 116/66 mmHg (12/10 0639) Pulse Rate: 76 (12/10 0639)  Labs:  Recent Labs  03/04/13 1243 03/04/13 1255 03/05/13 0538 03/06/13 0604  HGB 10.5*  --  9.9* 10.0*  HCT 32.7*  --  31.4* 31.3*  PLT 235  --  201 209  LABPROT  --  28.5* 30.4* 28.6*  INR  --  2.80* 3.04* 2.81*  CREATININE 1.28*  --  1.14* 1.34*  TROPONINI  --  <0.30  --   --     Estimated Creatinine Clearance: 23.7 ml/min (by C-G formula based on Cr of 1.34).   Medical History: Past Medical History  Diagnosis Date  . Hyperlipidemia   . Osteoporosis   . Hypertension     with severe left ventricular hypertrophy; normal ejection fraction in 04/2005  . Valvular heart disease     Mild to moderate aortic stenosis; moderate to severe mitral stenosis in 2007; mild MR  . Paroxysmal atrial fibrillation     Remote  . Diverticulosis     Pancolonic; h/o LGI bleeding and diverticulitis  . Degenerative joint disease     Knees  . Chronic kidney disease     Mild; creatinine of 1.19-1.28 in recent years  . Anemia, iron deficiency   . Popliteal cyst     Left  . Fracture of wrist 2008    left  . Gout   . Stroke     Medications:  Scheduled:  . allopurinol  100 mg Oral Daily  . aspirin EC  81 mg Oral Daily  . colchicine  0.6 mg Oral BID  . docusate  50 mg Oral BID  . fluticasone  2 spray Each Nare Daily  . furosemide  40 mg Oral Daily  . irbesartan  150 mg Oral Daily  . metoprolol tartrate  50 mg Oral QHS  . metoprolol tartrate  75 mg Oral q morning - 10a  . simvastatin  20 mg Oral q1800  . sodium chloride  3 mL Intravenous Q12H  . vancomycin  500 mg Intravenous Q24H  . warfarin  2.5 mg Oral Once  . Warfarin - Pharmacist Dosing Inpatient    Does not apply Q24H   Assessment: 79 yo F on chronic Coumadin 3.5mg  daily for Afib.   INR is therapeutic on admission but then trended up to > 3 yesterday.  INR is therapeutic today (remains at upper end).   **Note per outpatient Coumadin clinic records Coumadin dose was decreased to 3.5mg  daily except 1mg  on Tues last week.  No bleeding noted.     Goal of Therapy:  INR 2-3   Plan:  Coumadin 2.5mg  today x 1 (to discourage overshoot) Daily INR  Margo Aye, Zharia Conrow A 03/06/2013,7:47 AM

## 2013-03-06 NOTE — Progress Notes (Signed)
The patient was seen and examined. She was discussed with nurse practitioner, Ms. Vedia Coffer. Agree with her findings and assessment. On exam, the patient is noted to have a harsh 3/6 systolic murmur for a history of aortic stenosis. There is right medial ankle erythema and warmth and moderate tenderness and globally 2-3+ nonpitting right lower extremity edema.  The patient does have some degree acute on chronic diastolic heart failure, but but usually the lower extremity edema is bilaterally rather than unilaterally as seen in this patient's right lower extremity. I wonder if she has an element of lymphedema which can be unilateral. A dose of Solu-Medrol followed by a small prednisone taper may help in light of the pharmacist's recommendation to decrease the colchicine to 0.63 mg every other day because of the patient's renal insufficiency.  The bone scan revealed soft tissue swelling of the Right lower leg, ankle, and foot; degenerative changes of the mid right foot and metatarsals; and uptake at the Left medial malleolus corresponding to the site of fracture identified in 2003. There appears to be no evidence of occult fracture on the right.  We'll order PT consultation/evaluation for tomorrow. Consider discontinuation of the Foley catheter in the morning.

## 2013-03-07 DIAGNOSIS — I4891 Unspecified atrial fibrillation: Secondary | ICD-10-CM

## 2013-03-07 DIAGNOSIS — R739 Hyperglycemia, unspecified: Secondary | ICD-10-CM | POA: Diagnosis not present

## 2013-03-07 LAB — CBC
HCT: 33.7 % — ABNORMAL LOW (ref 36.0–46.0)
Hemoglobin: 10.7 g/dL — ABNORMAL LOW (ref 12.0–15.0)
MCH: 30.7 pg (ref 26.0–34.0)
MCHC: 31.8 g/dL (ref 30.0–36.0)
MCV: 96.6 fL (ref 78.0–100.0)
RBC: 3.49 MIL/uL — ABNORMAL LOW (ref 3.87–5.11)

## 2013-03-07 LAB — PROTIME-INR: Prothrombin Time: 31.1 seconds — ABNORMAL HIGH (ref 11.6–15.2)

## 2013-03-07 LAB — GLUCOSE, CAPILLARY
Glucose-Capillary: 166 mg/dL — ABNORMAL HIGH (ref 70–99)
Glucose-Capillary: 164 mg/dL — ABNORMAL HIGH (ref 70–99)

## 2013-03-07 LAB — BASIC METABOLIC PANEL
BUN: 23 mg/dL (ref 6–23)
CO2: 26 mEq/L (ref 19–32)
GFR calc non Af Amer: 35 mL/min — ABNORMAL LOW (ref >90)
Glucose, Bld: 143 mg/dL — ABNORMAL HIGH (ref 70–99)
Potassium: 4.1 mEq/L (ref 3.5–5.1)
Sodium: 136 mEq/L (ref 135–145)

## 2013-03-07 MED ORDER — WARFARIN SODIUM 1 MG PO TABS
1.0000 mg | ORAL_TABLET | Freq: Once | ORAL | Status: AC
  Administered 2013-03-07: 1 mg via ORAL
  Filled 2013-03-07: qty 1

## 2013-03-07 MED ORDER — INSULIN ASPART 100 UNIT/ML ~~LOC~~ SOLN
0.0000 [IU] | Freq: Three times a day (TID) | SUBCUTANEOUS | Status: DC
  Administered 2013-03-07: 3 [IU] via SUBCUTANEOUS
  Administered 2013-03-07: 2 [IU] via SUBCUTANEOUS

## 2013-03-07 NOTE — Progress Notes (Signed)
ANTIBIOTIC CONSULT NOTE  Pharmacy Consult for Vancomycin Indication: cellulitis  No Known Allergies  Patient Measurements: Height: 5\' 3"  (160 cm) Weight: 144 lb 14.4 oz (65.726 kg) IBW/kg (Calculated) : 52.4  Vital Signs: Temp: 97.4 F (36.3 C) (12/11 0641) Temp src: Oral (12/11 0641) BP: 126/74 mmHg (12/11 0641) Pulse Rate: 83 (12/11 0641) Intake/Output from previous day: 12/10 0701 - 12/11 0700 In: 720 [P.O.:720] Out: 1300 [Urine:1300] Intake/Output from this shift:    Labs:  Recent Labs  03/05/13 0538 03/06/13 0604 03/07/13 0540  WBC 6.1 6.6 7.6  HGB 9.9* 10.0* 10.7*  PLT 201 209 250  CREATININE 1.14* 1.34* 1.29*   Estimated Creatinine Clearance: 24.8 ml/min (by C-G formula based on Cr of 1.29). No results found for this basename: VANCOTROUGH, VANCOPEAK, VANCORANDOM, GENTTROUGH, GENTPEAK, GENTRANDOM, TOBRATROUGH, TOBRAPEAK, TOBRARND, AMIKACINPEAK, AMIKACINTROU, AMIKACIN,  in the last 72 hours   Microbiology: No results found for this or any previous visit (from the past 720 hour(s)).  Medical History: Past Medical History  Diagnosis Date  . Hyperlipidemia   . Osteoporosis   . Hypertension     with severe left ventricular hypertrophy; normal ejection fraction in 04/2005  . Valvular heart disease     Mild to moderate aortic stenosis; moderate to severe mitral stenosis in 2007; mild MR  . Paroxysmal atrial fibrillation     Remote  . Diverticulosis     Pancolonic; h/o LGI bleeding and diverticulitis  . Degenerative joint disease     Knees  . Chronic kidney disease     Mild; creatinine of 1.19-1.28 in recent years  . Anemia, iron deficiency   . Popliteal cyst     Left  . Fracture of wrist 2008    left  . Gout   . Stroke     Medications:  Scheduled:  . allopurinol  100 mg Oral Daily  . aspirin EC  81 mg Oral Daily  . [START ON 03/08/2013] colchicine  0.6 mg Oral QODAY  . docusate  50 mg Oral BID  . fluticasone  2 spray Each Nare Daily  .  furosemide  40 mg Oral Daily  . insulin aspart  0-9 Units Subcutaneous TID WC  . irbesartan  150 mg Oral Daily  . metoprolol tartrate  50 mg Oral QHS  . metoprolol tartrate  75 mg Oral q morning - 10a  . predniSONE  20 mg Oral BID WC  . simvastatin  20 mg Oral q1800  . sodium chloride  3 mL Intravenous Q12H  . vancomycin  500 mg Intravenous Q24H  . warfarin  1 mg Oral ONCE-1800  . Warfarin - Pharmacist Dosing Inpatient   Does not apply Q24H    Assessment: 79 yo F with improving cellulitis of RLE on IV Vancomycin.   Patient remains afebrile with normal WBC.   Renal function has been stable since admission.  Vancomycin 12/8>>  Goal of Therapy:  Vancomycin trough level 10-15 mcg/ml  Plan:  Continue Vancomycin 500mg  IV q24h Check Vancomycin trough at steady state -Noted plans to stop IV Vanc in next 24h hours so will defer trough level unless plan changes.  Monitor renal function and cx data.   Duration of therapy per MD  Elson Clan 03/07/2013,10:32 AM

## 2013-03-07 NOTE — Progress Notes (Signed)
[facility] PROGRESS NOTE  Judy Grant ZOX:[id number] DOB: [birth year] DOA: 03/04/2013 PCP: [name], MD  Assessment/Plan: Right lower extremity edema: Continues to improve.  Likely multifactorial i.e. Acute on chronic diastolic HF with gout flare and possible cellulitis. Bilateral venous Dopplers negative for DVT. Plain xray of ankle negative as well. Of note chart review indicates patient seen by her cardiologist several weeks ago and Lasix was decreased from 40 daily to 20 daily due to concern for dehydration. Shortly thereafter she began to develop right leg swelling and discomfort. She was seen one week ago and her Lasix was increased to 40mg  with continuing lower extremity edema. Some concern for acute gout flare and/or cellulitis along with some degree of diastolic heart failure. Right ankle continues with erythema/tenderness but much improved. Will continue empiric Vanc day #4. Lasix transitioned to home dose of 40mg  03/06/13. 2-D echo yields EF 75% with grade 2 diastolic dysfunction as well as moderate AS and moderate MS. Orthopedic consult appreciated. Bone scan yields soft tissue swelling of right lower ankle and foot, degenerative changes of the mid right foot and metatarsals; uptake at the left medial malleolus corresponding to the sit of fracture identified in 2003. No evidence of occult fracture on the righ. Colchecine discontinued secondary to renal function. Solumedrol x1 on 03/06/13 and prednisone started daily. Patient remains afebrile with a normal white count nontoxic appearing.  Active Problems:   Acute on chronic diastolic HF. See above. Weight stable 6kg. Volume status -2L. Home meds include metoprolol and lasix. Will continue these. Will arrange for OP follow with cardiologist at discharge.   Gout flare: Continues to improve.  Uric acid within the limits of normal. Appreciate ortho consult. Bone scan negative for occult fracture. Colchecine discontinued due to  renal function. Solumedrol x1 on 03/06/13 and prednisone started.  Will continue the allopurinol. Avoid NSAIDS due to her chronic kidney disease. Request PT evaluation for function. May benefit from rehab or certainly HH.   Cellulitis. Of concern on physical exam. However patient remains afebrile with a normal white count. Plain x-ray of the right ankle neg. Continue empiric vanc day #4 for now.   CKD (chronic kidney disease) stage 3, GFR 30-59 ml/min: Creatinine trending down. Creatinine still remains close to baseline according to chart. Continue po lasix.  Urine output good. Continue to monitor   Paroxysmal atrial fibrillation: Rate controlled. INR is 3.14 Continue Coumadin per pharmacy. Home medications include Lopressor, Diovan will continue these.   Hyperlipidemia: We'll continue statin  Hypertension: Controlled. SBP range 112-122. Will continue home medications and monitor   Anemia: hx iron deficiency likely related to chronic disease. Current level at baseline. No s/sx bleeding. Will monitor.   Hyperglycemia: steroid induced. May need prednisone for several days. Will monitor and use SSI as indicated.    Code Status: full Family Communication:  Disposition Plan: hopefully home tomorrow.   Consultants:  Dr. [facility]  Procedures:  none  Antibiotics:  Vancomycin 03/04/13>>  HPI/Subjective: Sitting on side of bed eating. Reports right ankle "feels better".   Objective: Filed Vitals:   03/07/13 0641  BP: 126/74  Pulse: 83  Temp: 97.4 F (36.3 C)  Resp: 18    Intake/Output Summary (Last 24 hours) at 03/07/13 0922 Last data filed at 03/07/13 0657  Gross per 24 hour  Intake    480 ml  Output   1300 ml  Net   -820 ml   Filed Weights   03/05/13 0448 03/06/13 0500 03/07/13 8119  Weight: 64.1 kg (141 lb 5 oz) 64.4 kg (141 lb 15.6 oz) 65.726 kg (144 lb 14.4 oz)    Exam:   General:  Well nourished NAD  Cardiovascular: S1 and S2. No gallup or rub.  +murmur. Right LE with non-pitting edema at ankle otherwise only trace LE edema  Respiratory: normal effort. BS are clear to auscultation bilaterally. i hear no crackles or wheeze  Abdomen: flat and soft. Abdomen non-tender to palpation. +BS in all 4 quadrants  Musculoskeletal: right ankle with improving erythema and non-pitting edema medial aspect. Less tender and warm to touch.   Data Reviewed: Basic Metabolic Panel:  Recent Labs Lab 03/04/13 1243 03/05/13 0538 03/06/13 0604 03/07/13 0540  NA 136 136 136 136  K 4.8 4.4 4.0 4.1  CL 96 99 98 98  CO2 28 29 28 26   GLUCOSE 94 104* 94 143*  BUN 22 19 20 23   CREATININE 1.28* 1.14* 1.34* 1.29*  CALCIUM 10.9* 9.8 9.4 9.4   Liver Function Tests:  Recent Labs Lab 03/04/13 1255  AST 18  ALT 11  ALKPHOS 63  BILITOT 1.0  PROT 8.3  ALBUMIN 3.9   No results found for this basename: LIPASE, AMYLASE,  in the last 168 hours No results found for this basename: AMMONIA,  in the last 168 hours CBC:  Recent Labs Lab 03/04/13 1243 03/05/13 0538 03/06/13 0604 03/07/13 0540  WBC 5.6 6.1 6.6 7.6  NEUTROABS 3.7  --   --   --   HGB 10.5* 9.9* 10.0* 10.7*  HCT 32.7* 31.4* 31.3* 33.7*  MCV 97.9 96.9 96.3 96.6  PLT 235 201 209 250   Cardiac Enzymes:  Recent Labs Lab 03/04/13 1255  TROPONINI <0.30   BNP (last 3 results)  Recent Labs  03/04/13 1243  PROBNP 4971.0*   CBG: No results found for this basename: GLUCAP,  in the last 168 hours  No results found for this or any previous visit (from the past 240 hour(s)).   Studies: Nm Bone Scan Limited  03/06/2013   CLINICAL DATA:  Right foot and ankle pain  EXAM: NUCLEAR MEDICINE BONE LIMITED  TECHNIQUE: After intravenous administration of radiopharmaceutical, delayed planar images were obtained in multiple projections through the limited area of interest.  COMPARISON:  None  Radiographic correlation: Right ankle radiographs 03/04/2013, left ankle radiographs 12/01/2011   RADIOPHARMACEUTICALS:  25 MILLI CURIE TC-MDP TECHNETIUM TC 69M MEDRONATE IV KIT  FINDINGS: Mild swelling of right lower leg, ankle and foot versus left with soft tissue retention of tracer.  Small focus of increased tracer localization is identified at the lateral aspect of the right midfoot, question related to the mid tarsals.  This could be due to degenerative changes or trauma.  Significantly increased uptake is seen in the left foot at the talonavicular joint corresponding to significant degenerative changes at the talonavicular joint identified on prior radiographs.  Additional uptake at the left medial malleolus, corresponding to medial malleolar fracture on the prior radiographs.  No additional abnormal areas of osseous tracer localization identified.  IMPRESSION: Soft tissue swelling right lower leg, ankle, and foot.  Minimal uptake in the mid right foot laterally at the mid tarsals, potentially degenerative or posttraumatic in origin.  Uptake in left foot at significant talonavicular degenerative changes.  Uptake at left medial malleolus corresponding to site of fracture identified in 2013.   Electronically Signed   By: Ulyses Southward M.D.   On: 03/06/2013 15:29    Scheduled Meds: . allopurinol  100  mg Oral Daily  . aspirin EC  81 mg Oral Daily  . [START ON 03/08/2013] colchicine  0.6 mg Oral QODAY  . docusate  50 mg Oral BID  . fluticasone  2 spray Each Nare Daily  . furosemide  40 mg Oral Daily  . irbesartan  150 mg Oral Daily  . metoprolol tartrate  50 mg Oral QHS  . metoprolol tartrate  75 mg Oral q morning - 10a  . predniSONE  20 mg Oral BID WC  . simvastatin  20 mg Oral q1800  . sodium chloride  3 mL Intravenous Q12H  . vancomycin  500 mg Intravenous Q24H  . warfarin  1 mg Oral ONCE-1800  . Warfarin - Pharmacist Dosing Inpatient   Does not apply Q24H   Continuous Infusions: . sodium chloride 20 mL/hr at 03/05/13 1613    Principal Problem:   Lower extremity edema Active  Problems:   ANEMIA-IRON DEFICIENCY   Hyperlipidemia   Hypertension   Paroxysmal atrial fibrillation   Chronic kidney disease   CKD (chronic kidney disease) stage 3, GFR 30-59 ml/min   Gout attack   Cellulitis   Gout flare   Acute on chronic diastolic HF (heart failure)   Hyperglycemia    Time spent: 30 minutes    [place] M  [facility] Pager [phone]. If 7PM-7AM, please contact night-coverage at [email or web address], password [facility] 03/07/2013, 9:22 AM  LOS: 3 days

## 2013-03-07 NOTE — Progress Notes (Signed)
ANTICOAGULATION CONSULT NOTE  Pharmacy Consult for Coumadin Indication: atrial fibrillation  No Known Allergies  Patient Measurements: Height: 5\' 3"  (160 cm) Weight: 144 lb 14.4 oz (65.726 kg) IBW/kg (Calculated) : 52.4  Vital Signs: Temp: 97.4 F (36.3 C) (12/11 0641) Temp src: Oral (12/11 0641) BP: 126/74 mmHg (12/11 0641) Pulse Rate: 83 (12/11 0641)  Labs:  Recent Labs  03/04/13 1255 03/05/13 0538 03/06/13 0604 03/07/13 0540  HGB  --  9.9* 10.0* 10.7*  HCT  --  31.4* 31.3* 33.7*  PLT  --  201 209 250  LABPROT 28.5* 30.4* 28.6* 31.1*  INR 2.80* 3.04* 2.81* 3.14*  CREATININE  --  1.14* 1.34* 1.29*  TROPONINI <0.30  --   --   --     Estimated Creatinine Clearance: 24.8 ml/min (by C-G formula based on Cr of 1.29).   Medical History: Past Medical History  Diagnosis Date  . Hyperlipidemia   . Osteoporosis   . Hypertension     with severe left ventricular hypertrophy; normal ejection fraction in 04/2005  . Valvular heart disease     Mild to moderate aortic stenosis; moderate to severe mitral stenosis in 2007; mild MR  . Paroxysmal atrial fibrillation     Remote  . Diverticulosis     Pancolonic; h/o LGI bleeding and diverticulitis  . Degenerative joint disease     Knees  . Chronic kidney disease     Mild; creatinine of 1.19-1.28 in recent years  . Anemia, iron deficiency   . Popliteal cyst     Left  . Fracture of wrist 2008    left  . Gout   . Stroke     Medications:  Scheduled:  . allopurinol  100 mg Oral Daily  . aspirin EC  81 mg Oral Daily  . [START ON 03/08/2013] colchicine  0.6 mg Oral QODAY  . docusate  50 mg Oral BID  . fluticasone  2 spray Each Nare Daily  . furosemide  40 mg Oral Daily  . irbesartan  150 mg Oral Daily  . metoprolol tartrate  50 mg Oral QHS  . metoprolol tartrate  75 mg Oral q morning - 10a  . predniSONE  20 mg Oral BID WC  . simvastatin  20 mg Oral q1800  . sodium chloride  3 mL Intravenous Q12H  . vancomycin  500 mg  Intravenous Q24H  . Warfarin - Pharmacist Dosing Inpatient   Does not apply Q24H   Assessment: 79 yo F on chronic Coumadin 3.5mg  daily for Afib.   INR is therapeutic on admission but then trended up to > 3.   **Note per outpatient Coumadin clinic records Coumadin dose was decreased to 3.5mg  daily except 1mg  on Tues last week.  No bleeding noted.     Goal of Therapy:  INR 2-3   Plan:  Coumadin 1mg  today x 1  Daily INR  Jaimere Feutz, [facility] 03/07/2013,8:59 AM

## 2013-03-07 NOTE — Progress Notes (Signed)
The patient was seen and examined. She was discussed with nurse practitioner, Ms. Vedia Coffer. Her laboratory studies and vital signs were reviewed. She is symptomatically improving. Physical therapist, Ms. Manson Passey recommends home health physical therapy at the time of discharge. I also favor getting as much home health help for her as possible. Continue current management. She will likely be discharged home tomorrow with home health services.

## 2013-03-07 NOTE — Clinical Social Work Psychosocial (Signed)
Clinical Social Work Department BRIEF PSYCHOSOCIAL ASSESSMENT 03/07/2013  Patient:  Judy Grant, Judy Grant     Account Number:  [id number]     Admit date:  03/04/2013  Clinical Social Worker:  Nancie Neas  Date/Time:  03/07/2013 11:35 AM  Referred by:  CSW  Date Referred:  03/07/2013 Referred for  ALF Placement   Other Referral:   Interview type:  Patient Other interview type:   Judy Grant- nephew    PSYCHOSOCIAL DATA Living Status:  WITH DISABLED ADULT Admitted from facility:   Level of care:   Primary support name:  Judy Grant Primary support relationship to patient:  CHILD, ADULT Degree of support available:   supportive    CURRENT CONCERNS Current Concerns  Post-Acute Placement   Other Concerns:    SOCIAL WORK ASSESSMENT / PLAN CSW met with pt and pt's nephew Judy Grant at bedside. Pt alert and oriented and reports she lives with her adult son with CP. They have a CAP aid from 8-4 Monday through Friday and then from 8-11 on Saturday. Pt indicates that her son is able to ambulate, but the most difficult task for her after the aid leaves is his toileting. The aid assists with meal preparation so that pt only has to warm their food up. Pt uses a walker some, but primarily relies on her motorized wheelchair. Pt found to be close to functional baseline per PT and felt to be very appropriate for ALF. CSW discussed this with pt who is very concerned about being somewhere with her son. Their aid and her daughter are currently assisting son while pt is hospitalized. CSW shared different options and offered to place pt from hospital while family worked to assist son in getting placed as well. Pt asked about increasing aid hours at home and CSW informed her that this would have to be done through [employer]. They plan to follow up on tihs option. ALF list provided. Pt ultimately has decided to return home with home health PT and social worker and visit facilities in order to find a good match  for her and her son. Pt's daughter will also be involved in this process. If they can qualify for increased CAP hours, pt feels they can continue to manage at home. Nephew also asked about private duty care and states that they could probably afford several hours of assistance in order to take some of the care off of pt. CM notified of these needs.  Pt and nephew also have specific Medicaid questions which CSW deferred to DSS.   Assessment/plan status:  Referral to [facility] Other assessment/ plan:   Information/referral to community resources:   ALF list  CM for private duty care list and home health    PATIENT'S/FAMILY'S RESPONSE TO PLAN OF CARE: Pt very pleasant and appreciative of information. She plans to return home and will either have increased aid services in the home or consider ALF at that time. She is aware of need for PCP to complete paperwork if placed from home.       Judy Grant, [place] [phone]

## 2013-03-07 NOTE — Evaluation (Signed)
Physical Therapy Evaluation Patient Details Name: Judy Grant MRN: [id number] DOB: Aug 02, 1917 Today's Date: 03/07/2013 Time: 8469-6295 PT Time Calculation (min): 43 min  PT Assessment / Plan / Recommendation History of Present Illness  Pt was admitted with acute inflammatory event to the right ankle.  She was unable to weight bear on the RLE.  Pt lives in a mobile home, mobility primarily via power w/c.  Her adult son with CP lives with her.  They have an aide 8-4 on M-F and then a few hours on Sat.  Pt has to care for her son the rest of the time.  She has generalized OA with severe joint deformity and currently has mild CHF.  Pt sustained a mild stroke with right sided weakness recently.  Ankle pain, probably gout, is significantly improved today.      Clinical Impression  Pt was seen for evaluation.  She continues to have edema and erythema at the right ankle, but pain is only with weight bearing.  Currently, she is close to functional baseline.  However, baseline is that of very limited mobility.  I am concerned about her ability to continue to live at home, caring for her son, with only partial assistance at home.  I have recommended ACLF for pt (and therefore her son).  MSW has spoken with her and will assist with that as able.    PT Assessment  Patient needs continued PT services    Follow Up Recommendations  Home health PT    Does the patient have the potential to tolerate intense rehabilitation      Barriers to Discharge Decreased caregiver support should have 24 hour assist    Equipment Recommendations  None recommended by PT    Recommendations for Other Services     Frequency Min 3X/week    Precautions / Restrictions Precautions Precautions: Fall Restrictions Weight Bearing Restrictions: No   Pertinent Vitals/Pain       Mobility  Bed Mobility Bed Mobility: Supine to Sit Supine to Sit: 6: Modified independent (Device/Increase time);HOB  elevated Transfers Transfers: Sit to Stand;Stand to Sit;Stand Pivot Transfers Sit to Stand: 5: Supervision;With upper extremity assist;From bed Stand to Sit: 5: Supervision;With upper extremity assist;To chair/3-in-1 Stand Pivot Transfers: 5: Supervision Ambulation/Gait Ambulation/Gait Assistance: 5: Supervision Ambulation Distance (Feet): 25 Feet Assistive device: Rolling walker Gait Pattern: Trunk flexed;Left flexed knee in stance;Right flexed knee in stance;Decreased stance time - right Gait velocity: slow and labored Stairs: No    Exercises     PT Diagnosis: Difficulty walking;Generalized weakness;Acute pain  PT Problem List: Decreased strength;Decreased activity tolerance;Decreased mobility;Pain PT Treatment Interventions: Gait training;Therapeutic exercise     PT Goals(Current goals can be found in the care plan section) Acute Rehab PT Goals Patient Stated Goal: wants to be able to remain living with her son PT Goal Formulation: With patient/family Time For Goal Achievement: 03/21/13 Potential to Achieve Goals: Good  Visit Information  Last PT Received On: 03/07/13 History of Present Illness: Pt was admitted with acute inflammatory event to the right ankle.  She was unable to weight bear on the RLE.  Pt lives in a mobile home, mobility primarily via power w/c.  Her adult son with CP lives with her.  They have an aide 8-4 on M-F and then a few hours on Sat.  Pt has to care for her son the rest of the time.  She has generalized OA with severe joint deformity and currently has mild CHF.  Pt sustained a mild  stroke with right sided weakness recently.  Ankle pain, probably gout, is significantly improved today.           Prior Functioning  Home Living Family/patient expects to be discharged to:: Assisted living Living Arrangements: Children Home Equipment: Dan Humphreys - 2 wheels;Wheelchair - power;Tub bench Prior Function Level of Independence: Independent with assistive  device(s) Communication Communication: No difficulties Dominant Hand: Right    Cognition  Cognition Arousal/Alertness: Awake/alert Behavior During Therapy: WFL for tasks assessed/performed Overall Cognitive Status: Within Functional Limits for tasks assessed    Extremity/Trunk Assessment Lower Extremity Assessment Lower Extremity Assessment: Generalized weakness   Balance    End of Session PT - End of Session Equipment Utilized During Treatment: Gait belt Activity Tolerance: Patient tolerated treatment well Patient left: in chair;with call bell/phone within reach;with family/visitor present  GP     Konrad Penta 03/07/2013, 10:46 AM

## 2013-03-07 NOTE — Progress Notes (Signed)
Subjective: My right foot is less painful.   Objective: Vital signs in last 24 hours: Temp:  [97.4 F (36.3 C)-99.2 F (37.3 C)] 97.4 F (36.3 C) (12/11 0641) Pulse Rate:  [72-96] 83 (12/11 0641) Resp:  [18] 18 (12/11 0641) BP: (112-126)/(68-78) 126/74 mmHg (12/11 0641) SpO2:  [100 %] 100 % (12/11 0641) Weight:  [65.726 kg (144 lb 14.4 oz)] 65.726 kg (144 lb 14.4 oz) (12/11 0641)  Intake/Output from previous day: 12/10 0701 - 12/11 0700 In: 720 [P.O.:720] Out: 1300 [Urine:1300] Intake/Output this shift:     Recent Labs  03/04/13 1243 03/05/13 0538 03/06/13 0604 03/07/13 0540  HGB 10.5* 9.9* 10.0* 10.7*    Recent Labs  03/06/13 0604 03/07/13 0540  WBC 6.6 7.6  RBC 3.25* 3.49*  HCT 31.3* 33.7*  PLT 209 250    Recent Labs  03/06/13 0604 03/07/13 0540  NA 136 136  K 4.0 4.1  CL 98 98  CO2 28 26  BUN 20 23  CREATININE 1.34* 1.29*  GLUCOSE 94 143*  CALCIUM 9.4 9.4    Recent Labs  03/06/13 0604 03/07/13 0540  INR 2.81* 3.14*    Neurologically intact Neurovascular intact Sensation intact distally Intact pulses distally Dorsiflexion/Plantar flexion intact  Bone scan showed degenerative changes and soft tissue swelling but no occult fracture of the right foot.  She has less swelling with the bed rest.  I will order physical therapy activity.  Assessment/Plan: Right foot pain and swelling, mild degenerative changes.  Begin PT.   Judy Grant 03/07/2013, 7:42 AM

## 2013-03-08 DIAGNOSIS — D509 Iron deficiency anemia, unspecified: Secondary | ICD-10-CM

## 2013-03-08 LAB — BASIC METABOLIC PANEL
CO2: 25 mEq/L (ref 19–32)
Chloride: 99 mEq/L (ref 96–112)
Creatinine, Ser: 1.34 mg/dL — ABNORMAL HIGH (ref 0.50–1.10)
GFR calc Af Amer: 38 mL/min — ABNORMAL LOW (ref >90)
GFR calc non Af Amer: 33 mL/min — ABNORMAL LOW (ref >90)
Sodium: 134 mEq/L — ABNORMAL LOW (ref 135–145)

## 2013-03-08 LAB — PROTIME-INR
INR: 3.92 — ABNORMAL HIGH (ref 0.00–1.49)
Prothrombin Time: 36.9 seconds — ABNORMAL HIGH (ref 11.6–15.2)

## 2013-03-08 MED ORDER — WARFARIN SODIUM 2.5 MG PO TABS: ORAL_TABLET | ORAL | Status: DC

## 2013-03-08 MED ORDER — PREDNISONE 20 MG PO TABS: 20.0000 mg | ORAL_TABLET | Freq: Two times a day (BID) | ORAL | Status: AC

## 2013-03-08 NOTE — Discharge Summary (Signed)
Physician Discharge Summary  Judy Grant VOZ:[id number] DOB: October 03, 1917 DOA: 03/04/2013  PCP: [name], MD  Admit date: 03/04/2013 Discharge date: 03/08/2013  Time spent: 40 minutes  Recommendations for Outpatient Follow-up:  1. Pt has appointment with  [facility] 03/11/13 for INR check and coumadin dosing  2. PCP 1 week for evaluation of symptoms. Recommend BMET for evaluation of serum glucose  3. Home Health RN, PT, aid arranged for assistance  4. Family members investigating assisted living opportunities for both patient and her disabled son who lives with her currently   Discharge Diagnoses:  Principal Problem:   Lower extremity edema Active Problems: Acute on chronic diastolic HF (heart failure) Gout flare  CCKD (chronic kidney disease) stage 3, GFR 30-59 ml/minellulitis Hyperglycemia   ANEMIA-IRON DEFICIENCY   Hyperlipidemia   Hypertension   Paroxysmal atrial fibrillation        Discharge Condition: stable  Diet recommendation: heart healthy  Filed Weights   03/05/13 0448 03/06/13 0500 03/07/13 0641  Weight: 64.1 kg (141 lb 5 oz) 64.4 kg (141 lb 15.6 oz) 65.726 kg (144 lb 14.4 oz)    History of present illness:  Judy Grant is a delightful 79 y.o. female with a past medical history that includes A. fib, valvular heart disease, hypertension, hyperlipidemia, chronic kidney disease stage III, iron deficiency anemia, gout presented to the emergency room 03/04/13 from her cardiologist office with the chief complaint of right lower extremity edema. She indicated that her right leg had been slowly swelling over the previous 2 weeks or so. She indicated that 2-3 days prior the right ankle became red and inflamed and very painful. She indicated that she does use a walker with ambulation but over the previous 2 days the pain in her right ankle was so severe she was unable to bear weight and had been using a wheelchair. She denied fever chills  recent falls or trauma to the area. She does have a history of gout but is unable to remember when she had her last gout attack. She denied any chest pain palpitations shortness of breath headache dizziness visual disturbances. She stated that Dr. Hilda Lias did draw some fluid off of her knee several months ago. Workup in the emergency room yielded a hemoglobin of 10.5 creatinine of 1.2 INR of 2.8. Bilateral venous Dopplers negative for DVT. Does note a stable left popliteal Baker's cyst on the left. Chest x-ray yielded no active disease. Similar degree of cardiomegaly with left atrial enlargement and pulmonary vascular congestion without overt edema. Vital signs significant for a blood pressure of 168/92 heart rate of 82. EKG showed atrial fibrillation with no acute changes.   Hospital Course:  Right lower extremity edema: Likely multifactorial i.e. Acute on chronic diastolic HF with gout flare and possible cellulitis. Bilateral venous Dopplers negative for DVT. Plain xray of ankle negative as well. Of note chart review indicates patient seen by her cardiologist several weeks ago and Lasix was decreased from 40 daily to 20 daily due to concern for dehydration. Shortly thereafter she began to develop right leg swelling and discomfort. She was seen one week ago and her Lasix was increased to 40mg  with continuing lower extremity edema. She was admitted and provided with IV lasix for 2 days. She diuresed 2L and the edema in bilateral LE much improved. Lasix was transitioned back to home po dose of 40mg  daily on 03/06/13.  There was also some concern for acute gout flare and/or cellulitis along with some degree of  diastolic heart failure. She was started on colchecine and vancomycin.  Right ankle continued with erythema/tenderness. Dr. Hilda Lias with orthopedics evaluated and obtained bone scan  Yielding soft tissue swelling of right lower ankle and foot, degenerative changes of the mid right foot and metatarsals; uptake  at the left medial malleolus corresponding to the sit of fracture identified in 2003. No evidence of occult fracture on the righ. Colchecine changed to every other day due to renal function. Solumedrol x1 on 03/06/13 and prednisone 20mg  daily started. She was evaluated by physical therapy who recommended HH PT.  Patient remained afebrile with a normal white count nontoxic appearing. At discharge right LE edema much improved. Will be discharged with 3 days prednisone. She completed 4 days antibiotics and therefore will be discontinued. Will discontinue colchicine as well.  Recommend follow up with PCP 1 week for evaluation of symptoms Active Problems:  Acute on chronic diastolic HF. EF 75% with grade 2 diastolic dysfunction as well as moderate AS and moderate MS. Weight stable 65kg. Volume status -2L. Home meds include metoprolol and lasix. Will continue these.  Recommend follow up cardiology 1 month   Gout flare:Improved after steroid burst. Uric acid within the limits of normal. Appreciate ortho consult. Bone scan negative for occult fracture. Colchecine discontinued at discharge due to renal function. Solumedrol x1 on 03/06/13 and prednisone started. Will continue prednisone 20mg  for 3 days at discharge.  Will continue the allopurinol. Avoid NSAIDS due to her chronic kidney disease.   Cellulitis. Of concern on physical exam at admission.  However patient remained afebrile with a normal white count. Plain x-ray of the right ankle negative.Recieved empiric vanc for 4 days.    CKD (chronic kidney disease) stage 3, GFR 30-59 ml/min:  Creatinine  Trended up initially due to IV lasix. At discharge creatinine of 1.3 close to baseline. Urine output remained good.    Paroxysmal atrial fibrillation: Rate controlled. INR is 3.92 at discharge. Home medications include coumadin 3.5mg  daily. Will discharge with instructions to hold coumadin 03/08/13 and resume 2.5mg  until INR check scheduled for 03/11/13 at coumadin  clinic.Continue Lopressor, Diovan.   Hyperlipidemia: Continue statin  Hypertension: Controlled. Continue home medications.   Anemia: hx iron deficiency likely related to chronic disease. Current level at baseline. No s/sx bleeding.  Marland Kitchen  Hyperglycemia: steroid induced. CBG 111 at discharge. No Hx diabetes. Will resolve after steroids. Recommend BMET in 1 week to evaluate serum glucose    Procedures:  none  Consultations:  Dr Hilda Lias orthopedics  Discharge Exam: Filed Vitals:   03/08/13 0850  BP: 151/82  Pulse:   Temp:   Resp:     General: sitting in chair. Appears comfortable NAD Cardiovascular: S1 and S2. +murmur. I hear no gallup or rub. RLE with trace pitting edema to foot and ankle. LLE with trace edema. Respiratory: normal effort BS are clear bilaterally to ausculation. i hear no wheeze or crackles Musculoskeletal: right ankle with mild erythema and edema to medial aspect. Much less tender and no heat.   Discharge Instructions       Future Appointments Provider Department Dept Phone   03/11/2013 3:10 PM [facility] [phone]   06/20/2013 10:15 AM Kerri Perches, MD [facility] [phone]       Medication List         alendronate 70 MG tablet  Commonly known as:  FOSAMAX  TAKE 1 TAB EACH WEEK 30 MIN PRIOR TO BREAKFAST WITH LARGE GLASS OF WATER. REMAIN  UPRIGHT.     allopurinol 100 MG tablet  Commonly known as:  ZYLOPRIM  TAKE ONE TABLET BY MOUTH ONCE DAILY.     ASPIRIN LOW DOSE 81 MG EC tablet  Generic drug:  aspirin  TAKE ONE TABLET BY MOUTH ONCE DAILY.     BENEFIBER PO  Take 1 each by mouth daily.     Calcium Carb-Cholecalciferol 500-400 MG-UNIT Tabs  TAKE (1) TABLET BY MOUTH (3) TIMES DAILY.     COLACE 50 MG capsule  Generic drug:  docusate sodium  TAKE (1) CAPSULE BY MOUTH TWICE DAILY.     fluticasone 50 MCG/ACT nasal spray  Commonly known as:  FLONASE  Place 2 sprays into the nose daily.      furosemide 40 MG tablet  Commonly known as:  LASIX  Take 1 tablet (40 mg total) by mouth daily.     HYDROcodone-acetaminophen 5-325 MG per tablet  Commonly known as:  NORCO/VICODIN  Take 1 tablet by mouth every 4 (four) hours as needed for moderate pain.     metoprolol 50 MG tablet  Commonly known as:  LOPRESSOR  TAKE 1 1/2 TABLETS BY MOUTH IN THE MORNING, & 1 TABLET DAILY AT BEDTIME.     polyethylene glycol powder powder  Commonly known as:  GLYCOLAX/MIRALAX  Take 17 g by mouth daily as needed (for constipation).     potassium chloride 10 MEQ tablet  Commonly known as:  K-DUR  TAKE (1) TABLET BY MOUTH ON TUESDAYS, THURSDAYS, SATURDAYS, AND SUNDAYS.     predniSONE 20 MG tablet  Commonly known as:  DELTASONE  Take 1 tablet (20 mg total) by mouth 2 (two) times daily with a meal.     simvastatin 20 MG tablet  Commonly known as:  ZOCOR  TAKE (1) TABLET BY MOUTH AT BEDTIME FOR CHOLESTEROL.     traMADol 50 MG tablet  Commonly known as:  ULTRAM  One tablet twice weekly, as needed, for uncontrolled arthritic pain     valsartan 160 MG tablet  Commonly known as:  DIOVAN  Take 1 tablet (160 mg total) by mouth daily.     warfarin 2.5 MG tablet  Commonly known as:  COUMADIN  Take one tablet daily starting 03/09/13. Have INR checked on 03/11/13 at coumadin clinic for dosing.       No Known Allergies Follow-up Information   Schedule an appointment as soon as possible for a visit with [name], MD. (1 week for evaluation of symptoms)    Specialty:  Family Medicine   Contact information:   [address], [address] [place] [address] [phone]       Follow up with [facility] [place]. (has appointment 03/11/13 at 3:10pm for INR check and coumadin dosing. If unable to make this appointment call for reschedule. )    Specialty:  Cardiology   Contact information:   [address] [place] [address] [phone]       The results of significant  diagnostics from this hospitalization (including imaging, microbiology, ancillary and laboratory) are listed below for reference.    Significant Diagnostic Studies: Dg Ankle Complete Right  03/04/2013   CLINICAL DATA:  Right ankle swelling, pain and redness.  EXAM: RIGHT ANKLE - COMPLETE 3+ VIEW  COMPARISON:  None.  FINDINGS: Diffuse and marked soft tissue swelling is present about the right ankle. No soft tissue gas collection or radiopaque foreign body is identified. There is no bony destructive change. No fracture or dislocation.  Scattered vascular and skin calcifications are noted.  IMPRESSION: Diffuse soft tissue swelling without focal abnormality.   Electronically Signed   By: Drusilla Kanner M.D.   On: 03/04/2013 15:52   Nm Bone Scan Limited  03/06/2013   CLINICAL DATA:  Right foot and ankle pain  EXAM: NUCLEAR MEDICINE BONE LIMITED  TECHNIQUE: After intravenous administration of radiopharmaceutical, delayed planar images were obtained in multiple projections through the limited area of interest.  COMPARISON:  None  Radiographic correlation: Right ankle radiographs 03/04/2013, left ankle radiographs 12/01/2011  RADIOPHARMACEUTICALS:  25 MILLI CURIE TC-MDP TECHNETIUM TC 77M MEDRONATE IV KIT  FINDINGS: Mild swelling of right lower leg, ankle and foot versus left with soft tissue retention of tracer.  Small focus of increased tracer localization is identified at the lateral aspect of the right midfoot, question related to the mid tarsals.  This could be due to degenerative changes or trauma.  Significantly increased uptake is seen in the left foot at the talonavicular joint corresponding to significant degenerative changes at the talonavicular joint identified on prior radiographs.  Additional uptake at the left medial malleolus, corresponding to medial malleolar fracture on the prior radiographs.  No additional abnormal areas of osseous tracer localization identified.  IMPRESSION: Soft tissue swelling  right lower leg, ankle, and foot.  Minimal uptake in the mid right foot laterally at the mid tarsals, potentially degenerative or posttraumatic in origin.  Uptake in left foot at significant talonavicular degenerative changes.  Uptake at left medial malleolus corresponding to site of fracture identified in 2013.   Electronically Signed   By: Ulyses Southward M.D.   On: 03/06/2013 15:29   US Venous Img Lower Bilateral  03/04/2013   CLINICAL DATA:  Congestive heart failure, edema, swelling  EXAM: BILATERAL LOWER EXTREMITY VENOUS DOPPLER ULTRASOUND  TECHNIQUE: Gray-scale sonography with graded compression, as well as color Doppler and duplex ultrasound, were performed to evaluate the deep venous system from the level of the common femoral vein through the popliteal and proximal calf veins. Spectral Doppler was utilized to evaluate flow at rest and with distal augmentation maneuvers.  COMPARISON:  07/20/2006  FINDINGS: Thrombus within deep veins:  None visualized.  Compressibility of deep veins:  Normal.  Duplex waveform respiratory phasicity:  Normal.  Duplex waveform response to augmentation:  Normal.  Venous reflux:  None visualized.  Other findings: Diffuse subcutaneous edema noted in both extremities. Within the left popliteal fossa, there is a complex Baker cyst measuring 4.5 x 1.1 x 2.5 cm. This was present on the prior study and appears stable.  IMPRESSION: Negative for DVT in either extremity  Stable left popliteal Bakers cyst  Subcutaneous peripheral edema bilaterally   Electronically Signed   By: Ruel Favors M.D.   On: 03/04/2013 14:32   US Venous Img Lower Unilateral Right  02/26/2013   CLINICAL DATA:  Leg swelling and ankle pain.  EXAM: RIGHT LOWER EXTREMITY VENOUS DOPPLER ULTRASOUND  TECHNIQUE: Gray-scale sonography with graded compression, as well as color Doppler and duplex ultrasound, were performed to evaluate the deep venous system from the level of the common femoral vein through the popliteal and  proximal calf veins. Spectral Doppler was utilized to evaluate flow at rest and with distal augmentation maneuvers.  COMPARISON:  None.  FINDINGS: Thrombus within deep veins:  None visualized.  There is compressibility and color Doppler flow within the right common femoral vein, femoral vein and right popliteal vein. The visualized calf veins are patent. There is subcutaneous edema.  Other findings:  None visualized.  IMPRESSION: Negative for right lower extremity DVT.  Subcutaneous edema.   Electronically Signed   By: Richarda Overlie M.D.   On: 02/26/2013 14:30   Dg Chest Portable 1 View  03/04/2013   CLINICAL DATA:  Congestive heart failure, gout  EXAM: PORTABLE CHEST - 1 VIEW  COMPARISON:  Prior chest x-ray 01/23/2012  FINDINGS: Enlargement of the cardiopericardial silhouette with probable left atrial enlargement. Atherosclerotic calcifications noted in the transverse aorta. Mild pulmonary vascular congestion without overt edema. No large pleural effusion. No pneumothorax. No acute osseous abnormality. Moderately severe bilateral glenohumeral joint osteoarthritis.  IMPRESSION: 1. No active disease. 2. Similar degree of cardiomegaly with left atrial enlargement. 3. Pulmonary vascular congestion without overt edema.   Electronically Signed   By: Malachy Moan M.D.   On: 03/04/2013 12:51    Microbiology: No results found for this or any previous visit (from the past 240 hour(s)).   Labs: Basic Metabolic Panel:  Recent Labs Lab 03/04/13 1243 03/05/13 0538 03/06/13 0604 03/07/13 0540 03/08/13 0536  NA 136 136 136 136 134*  K 4.8 4.4 4.0 4.1 5.1  CL 96 99 98 98 99  CO2 28 29 28 26 25   GLUCOSE 94 104* 94 143* 135*  BUN 22 19 20 23  28*  CREATININE 1.28* 1.14* 1.34* 1.29* 1.34*  CALCIUM 10.9* 9.8 9.4 9.4 9.2   Liver Function Tests:  Recent Labs Lab 03/04/13 1255  AST 18  ALT 11  ALKPHOS 63  BILITOT 1.0  PROT 8.3  ALBUMIN 3.9   No results found for this basename: LIPASE, AMYLASE,  in  the last 168 hours No results found for this basename: AMMONIA,  in the last 168 hours CBC:  Recent Labs Lab 03/04/13 1243 03/05/13 0538 03/06/13 0604 03/07/13 0540  WBC 5.6 6.1 6.6 7.6  NEUTROABS 3.7  --   --   --   HGB 10.5* 9.9* 10.0* 10.7*  HCT 32.7* 31.4* 31.3* 33.7*  MCV 97.9 96.9 96.3 96.6  PLT 235 201 209 250   Cardiac Enzymes:  Recent Labs Lab 03/04/13 1255  TROPONINI <0.30   BNP: BNP (last 3 results)  Recent Labs  03/04/13 1243  PROBNP 4971.0*   CBG:  Recent Labs Lab 03/07/13 1143 03/07/13 1648 03/07/13 2238 03/08/13 0739 03/08/13 1126  GLUCAP 215* 166* 164* 118* 111*       Signed:  Prabhjot Piscitello M  [facility] 03/08/2013, 12:23 PM

## 2013-03-08 NOTE — Discharge Planning (Signed)
Pt stated she was ready to be DC and IV and tele were removed.  Pt reported no pain.  Pt was educated with family in room and told of FU appointments and scripts.  Pt was explained importance of weight self daily and informing doctor is she notices further cellulitis issues. Pt will be wheeled to car by PCT and family when ready.

## 2013-03-08 NOTE — Care Management Note (Signed)
    Page 1 of 2   03/08/2013     2:39:15 PM   CARE MANAGEMENT NOTE 03/08/2013  Patient:  Judy Grant, Judy Grant   Account Number:  [id number]  Date Initiated:  03/07/2013  Documentation initiated by:  Rosemary Holms  Subjective/Objective Assessment:   Pt from home where she lived independently taking care of her disabled son. Pt has an aid from 8-4.Nephew, [name] will look into additional hours since the pt qualified for only 4 hours and her son for 4 hour. Assistance is needed After 4 pm     Action/Plan:   and on Sundays. Private Duty list given to [place]. Pt previously had [facility] and wishes to have HH RN, PT and CM will recommend SW.   Anticipated DC Date:  03/08/2013   Anticipated DC Plan:  HOME W HOME HEALTH SERVICES      DC Planning Services  CM consult      [facility] Choice  HOME HEALTH   Choice offered to / List presented to:  C-1 Patient        HH arranged  HH-1 RN  HH-2 PT  HH-6 SOCIAL WORKER      HH agency  [facility]   Status of service:  Completed, signed off Medicare Important Message given?  YES (If response is "NO", the following Medicare IM given date fields will be blank) Date Medicare IM given:  03/07/2013 Date Additional Medicare IM given:    Discharge Disposition:    Per UR Regulation:    If discussed at Long Length of Stay Meetings, dates discussed:    Comments:  03/07/13 Rosemary Holms RN BSN CM

## 2013-03-08 NOTE — Progress Notes (Signed)
ANTICOAGULATION CONSULT NOTE  Pharmacy Consult for Coumadin Indication: atrial fibrillation  No Known Allergies  Patient Measurements: Height: 5\' 3"  (160 cm) Weight: 144 lb 14.4 oz (65.726 kg) IBW/kg (Calculated) : 52.4  Vital Signs: Temp: 97.4 F (36.3 C) (12/12 0631) Temp src: Oral (12/12 0631) BP: 155/85 mmHg (12/12 0631) Pulse Rate: 75 (12/12 0631)  Labs:  Recent Labs  03/06/13 0604 03/07/13 0540 03/08/13 0536  HGB 10.0* 10.7*  --   HCT 31.3* 33.7*  --   PLT 209 250  --   LABPROT 28.6* 31.1* 36.9*  INR 2.81* 3.14* 3.92*  CREATININE 1.34* 1.29* 1.34*    Estimated Creatinine Clearance: 23.9 ml/min (by C-G formula based on Cr of 1.34).   Medical History: Past Medical History  Diagnosis Date  . Hyperlipidemia   . Osteoporosis   . Hypertension     with severe left ventricular hypertrophy; normal ejection fraction in 04/2005  . Valvular heart disease     Mild to moderate aortic stenosis; moderate to severe mitral stenosis in 2007; mild MR  . Paroxysmal atrial fibrillation     Remote  . Diverticulosis     Pancolonic; h/o LGI bleeding and diverticulitis  . Degenerative joint disease     Knees  . Chronic kidney disease     Mild; creatinine of 1.19-1.28 in recent years  . Anemia, iron deficiency   . Popliteal cyst     Left  . Fracture of wrist 2008    left  . Gout   . Stroke     Medications:  Scheduled:  . allopurinol  100 mg Oral Daily  . aspirin EC  81 mg Oral Daily  . colchicine  0.6 mg Oral QODAY  . docusate  50 mg Oral BID  . fluticasone  2 spray Each Nare Daily  . furosemide  40 mg Oral Daily  . insulin aspart  0-9 Units Subcutaneous TID WC  . irbesartan  150 mg Oral Daily  . metoprolol tartrate  50 mg Oral QHS  . metoprolol tartrate  75 mg Oral q morning - 10a  . predniSONE  20 mg Oral BID WC  . simvastatin  20 mg Oral q1800  . sodium chloride  3 mL Intravenous Q12H  . vancomycin  500 mg Intravenous Q24H  . Warfarin - Pharmacist Dosing  Inpatient   Does not apply Q24H   Assessment: 79 yo F on chronic Coumadin 3.5mg  daily for Afib.   INR is therapeutic on admission but has trended up to > 3 despite decreased dose.   **Note per outpatient Coumadin clinic records Coumadin dose was decreased to 3.5mg  daily except 1mg  on Tues last week.  No bleeding noted.     Goal of Therapy:  INR 2-3   Plan:  Hold Coumadin today x 1  Daily INR  Elson Clan 03/08/2013,8:09 AM

## 2013-03-08 NOTE — Discharge Summary (Addendum)
The patient was seen and examined. Her vital signs and laboratory studies were reviewed. She was discussed with nurse practitioner, Ms. Vedia Coffer. Agree with findings, assessment, and plan. She is symptomatically improved. She will be discharged on a short prednisone taper. Colchicine discontinued because of her renal insufficiency and the propensity for it to cause nausea and vomiting. She will continue on allopurinol. Her Coumadin dosing has been modified as her INR was supratherapeutic. This is likely secondary to antibiotics. She was instructed on the modification to hold the Coumadin this evening and to restart the dosing at 2.5 mg daily until she is followed by the Coumadin clinic early next week. This was discussed with her family.  Addendum: The patient's lower extremity edema was secondary to a combination of acute on chronic diastolic heart failure and acute gout flare/exacerbation.

## 2013-03-11 ENCOUNTER — Ambulatory Visit (INDEPENDENT_AMBULATORY_CARE_PROVIDER_SITE_OTHER): Payer: PRIVATE HEALTH INSURANCE | Admitting: *Deleted

## 2013-03-11 DIAGNOSIS — I6350 Cerebral infarction due to unspecified occlusion or stenosis of unspecified cerebral artery: Secondary | ICD-10-CM

## 2013-03-11 DIAGNOSIS — I639 Cerebral infarction, unspecified: Secondary | ICD-10-CM

## 2013-03-11 DIAGNOSIS — I480 Paroxysmal atrial fibrillation: Secondary | ICD-10-CM

## 2013-03-11 DIAGNOSIS — Z7901 Long term (current) use of anticoagulants: Secondary | ICD-10-CM

## 2013-03-11 DIAGNOSIS — I4891 Unspecified atrial fibrillation: Secondary | ICD-10-CM

## 2013-03-11 LAB — POCT INR: INR: 2.6

## 2013-03-14 ENCOUNTER — Telehealth: Payer: Self-pay

## 2013-03-14 NOTE — Telephone Encounter (Signed)
Judy Grant called and asked if [facility] needs to do PT/INR on patient.  Patient was checked in our office this week.  Please call Judy Grant with [facility].

## 2013-03-14 NOTE — Telephone Encounter (Signed)
Pt does not need INR check today.  [facility] will check INR on 03/19/13.

## 2013-03-15 ENCOUNTER — Ambulatory Visit (INDEPENDENT_AMBULATORY_CARE_PROVIDER_SITE_OTHER): Payer: PRIVATE HEALTH INSURANCE | Admitting: Family Medicine

## 2013-03-15 ENCOUNTER — Telehealth: Payer: Self-pay | Admitting: Family Medicine

## 2013-03-15 ENCOUNTER — Encounter (INDEPENDENT_AMBULATORY_CARE_PROVIDER_SITE_OTHER): Payer: Self-pay

## 2013-03-15 ENCOUNTER — Encounter: Payer: Self-pay | Admitting: Family Medicine

## 2013-03-15 VITALS — BP 128/80 | HR 92 | Resp 18

## 2013-03-15 DIAGNOSIS — I5033 Acute on chronic diastolic (congestive) heart failure: Secondary | ICD-10-CM

## 2013-03-15 DIAGNOSIS — L0291 Cutaneous abscess, unspecified: Secondary | ICD-10-CM

## 2013-03-15 DIAGNOSIS — K5730 Diverticulosis of large intestine without perforation or abscess without bleeding: Secondary | ICD-10-CM

## 2013-03-15 DIAGNOSIS — M109 Gout, unspecified: Secondary | ICD-10-CM

## 2013-03-15 DIAGNOSIS — E785 Hyperlipidemia, unspecified: Secondary | ICD-10-CM

## 2013-03-15 DIAGNOSIS — K5790 Diverticulosis of intestine, part unspecified, without perforation or abscess without bleeding: Secondary | ICD-10-CM

## 2013-03-15 DIAGNOSIS — L0390 Cellulitis, unspecified: Secondary | ICD-10-CM

## 2013-03-15 DIAGNOSIS — I10 Essential (primary) hypertension: Secondary | ICD-10-CM

## 2013-03-15 DIAGNOSIS — Z9181 History of falling: Secondary | ICD-10-CM

## 2013-03-15 LAB — BASIC METABOLIC PANEL
BUN: 40 mg/dL — ABNORMAL HIGH (ref 6–23)
CO2: 30 mEq/L (ref 19–32)
Calcium: 11.1 mg/dL — ABNORMAL HIGH (ref 8.4–10.5)
Chloride: 95 mEq/L — ABNORMAL LOW (ref 96–112)
Creat: 1.39 mg/dL — ABNORMAL HIGH (ref 0.50–1.10)
Glucose, Bld: 88 mg/dL (ref 70–99)

## 2013-03-15 NOTE — Assessment & Plan Note (Signed)
Needs to follow through as out pt with [facility] involvement with family or other social worker , re placement in alternate living situation with her handicapped son

## 2013-03-15 NOTE — Assessment & Plan Note (Signed)
Controlled, no change in medication  

## 2013-03-15 NOTE — Telephone Encounter (Signed)
Please follow through with current s/w or family member to see if there is serious intent on changing living situation for Judy Grant and pt, as mentioned in the d/c summary, her "family" was involved, she does have a daughter in [place]. I strongly believe that this move would be in the best interest for both patients. Thanks.

## 2013-03-15 NOTE — Assessment & Plan Note (Addendum)
Marked symptom improvement , hydrocodone to be prescribed by ortho, d/c tramadol. Now on allopurinol also Chem 7 stat today

## 2013-03-15 NOTE — Patient Instructions (Addendum)
F/u in 6 weeks , call if you need me before.  Chem 7  stat this morning  Discontinue tramadol effective 03/15/2013  Your pain medication is restricted to hydrocodone alone per the directions of Dr Hilda Lias the prescriber  I do agree that you should be looking into alternate living situation with Adela Glimpse as you are gerting older  Exam reveals soft stool low in your anus  so you should be able to have a bowel movement today

## 2013-03-15 NOTE — Assessment & Plan Note (Signed)
Rectal exam: heme negative stool 

## 2013-03-15 NOTE — Telephone Encounter (Signed)
Patient will have to have pt evaluation.  Will need to address this when patient returns in Judy Grant

## 2013-03-15 NOTE — Assessment & Plan Note (Signed)
Controlled, no change in medication Updated lab at next visit

## 2013-03-15 NOTE — Assessment & Plan Note (Signed)
resolved 

## 2013-03-15 NOTE — Progress Notes (Signed)
   Subjective:    Patient ID: Judy Grant, female    DOB: [birth year], 79 y.o.   MRN: [id number]  HPI Pt released from hospital 1 week ago , due to a diagnosis of decompensated CHF. She had initially presented with acute right ankle pain, is now on allopurinol, although her uric acid level is normal Still has some pain in the ankle but not as severe as before, had been discharged on prednisone also which she has just completed. Pain managemnt needs to be streamlined to one provider and tramadol is removed from list, she will get hydiocodone from ortho which she has done in the past. Pt reports good appetite , states no BM since hospital d/c , states she was on the commode all last night urinating Denies fever, chills, head or chest congestion. Home health is involved in her care , and d/c summary states that alternate  Living situation is b being sought by family   Review of Systems See HPI Denies recent fever or chills. Denies sinus pressure, nasal congestion, ear pain or sore throat. Denies chest congestion, productive cough or wheezing. Denies chest pains or  palpitations , has chronic right leg swelling Denies abdominal pain, nausea, vomiting,diarrhea or constipation.   . Denies headaches, seizures, numbness, or tingling. Denies depression, anxiety or insomnia. Denies skin break down or rash.        Objective:   Physical Exam  Patient alert and oriented and in no cardiopulmonary distress.  HEENT: No facial asymmetry, EOMI, no sinus tenderness,  oropharynx pink and moist.  Neck decreased ROM,no jVd, no adenopathy.  Chest: Clear to auscultation bilaterally.  CVS: S1, S2  Systolic murmur, no S3.  ABD: Soft non tender. Rectal: soft brown stool in low anus  Ext: bilateral edema rigth greater than left  MS: Decreased  ROM spine, shoulders, hips and knees.right ankle swollen and mildly tender  Skin: Intact, no ulcerations or rash noted.  Psych: Good eye contact,  normal affect. Memory impaired  not anxious or depressed appearing.  CNS: CN 2-12 intact, power,  normal throughout.       Assessment & Plan:

## 2013-03-18 ENCOUNTER — Ambulatory Visit (INDEPENDENT_AMBULATORY_CARE_PROVIDER_SITE_OTHER): Payer: PRIVATE HEALTH INSURANCE | Admitting: Cardiovascular Disease

## 2013-03-18 ENCOUNTER — Telehealth: Payer: Self-pay

## 2013-03-18 DIAGNOSIS — I6350 Cerebral infarction due to unspecified occlusion or stenosis of unspecified cerebral artery: Secondary | ICD-10-CM

## 2013-03-18 DIAGNOSIS — I480 Paroxysmal atrial fibrillation: Secondary | ICD-10-CM

## 2013-03-18 DIAGNOSIS — Z7901 Long term (current) use of anticoagulants: Secondary | ICD-10-CM

## 2013-03-18 DIAGNOSIS — I639 Cerebral infarction, unspecified: Secondary | ICD-10-CM

## 2013-03-18 DIAGNOSIS — I4891 Unspecified atrial fibrillation: Secondary | ICD-10-CM

## 2013-03-18 NOTE — Telephone Encounter (Signed)
Orders given to [facility] at [facility] to repeat BMP in 3 weeks

## 2013-03-18 NOTE — Telephone Encounter (Signed)
[facility] last day seeing Judy Grant is going to be Jan 2. Advised to just drawn BMP at that time due to her travel difficulties.

## 2013-03-19 MED ORDER — POTASSIUM CHLORIDE ER 10 MEQ PO TBCR: EXTENDED_RELEASE_TABLET | ORAL | Status: DC

## 2013-03-19 NOTE — Telephone Encounter (Signed)
error 

## 2013-03-29 ENCOUNTER — Ambulatory Visit (INDEPENDENT_AMBULATORY_CARE_PROVIDER_SITE_OTHER): Payer: PRIVATE HEALTH INSURANCE | Admitting: *Deleted

## 2013-03-29 DIAGNOSIS — I4891 Unspecified atrial fibrillation: Secondary | ICD-10-CM

## 2013-03-29 DIAGNOSIS — I6350 Cerebral infarction due to unspecified occlusion or stenosis of unspecified cerebral artery: Secondary | ICD-10-CM

## 2013-03-29 DIAGNOSIS — I639 Cerebral infarction, unspecified: Secondary | ICD-10-CM

## 2013-03-29 DIAGNOSIS — Z7901 Long term (current) use of anticoagulants: Secondary | ICD-10-CM

## 2013-03-29 DIAGNOSIS — I480 Paroxysmal atrial fibrillation: Secondary | ICD-10-CM

## 2013-03-29 LAB — POCT INR: INR: 2.7

## 2013-03-29 NOTE — Telephone Encounter (Signed)
Noted. Will follow up.

## 2013-04-04 ENCOUNTER — Ambulatory Visit (INDEPENDENT_AMBULATORY_CARE_PROVIDER_SITE_OTHER): Payer: PRIVATE HEALTH INSURANCE | Admitting: *Deleted

## 2013-04-04 DIAGNOSIS — I6350 Cerebral infarction due to unspecified occlusion or stenosis of unspecified cerebral artery: Secondary | ICD-10-CM

## 2013-04-04 DIAGNOSIS — Z7901 Long term (current) use of anticoagulants: Secondary | ICD-10-CM

## 2013-04-04 DIAGNOSIS — I4891 Unspecified atrial fibrillation: Secondary | ICD-10-CM

## 2013-04-04 DIAGNOSIS — I639 Cerebral infarction, unspecified: Secondary | ICD-10-CM

## 2013-04-04 DIAGNOSIS — I480 Paroxysmal atrial fibrillation: Secondary | ICD-10-CM

## 2013-04-04 LAB — POCT INR: INR: 2.3

## 2013-04-11 ENCOUNTER — Ambulatory Visit (INDEPENDENT_AMBULATORY_CARE_PROVIDER_SITE_OTHER): Payer: PRIVATE HEALTH INSURANCE | Admitting: *Deleted

## 2013-04-11 DIAGNOSIS — Z7901 Long term (current) use of anticoagulants: Secondary | ICD-10-CM

## 2013-04-11 DIAGNOSIS — I480 Paroxysmal atrial fibrillation: Secondary | ICD-10-CM

## 2013-04-11 DIAGNOSIS — I6350 Cerebral infarction due to unspecified occlusion or stenosis of unspecified cerebral artery: Secondary | ICD-10-CM

## 2013-04-11 DIAGNOSIS — I4891 Unspecified atrial fibrillation: Secondary | ICD-10-CM

## 2013-04-11 DIAGNOSIS — I639 Cerebral infarction, unspecified: Secondary | ICD-10-CM

## 2013-04-11 LAB — POCT INR: INR: 2

## 2013-04-18 ENCOUNTER — Encounter: Payer: Self-pay | Admitting: Family Medicine

## 2013-04-25 ENCOUNTER — Encounter: Payer: Self-pay | Admitting: Family Medicine

## 2013-04-25 ENCOUNTER — Encounter (INDEPENDENT_AMBULATORY_CARE_PROVIDER_SITE_OTHER): Payer: Self-pay

## 2013-04-25 ENCOUNTER — Ambulatory Visit (INDEPENDENT_AMBULATORY_CARE_PROVIDER_SITE_OTHER): Payer: PRIVATE HEALTH INSURANCE | Admitting: Family Medicine

## 2013-04-25 VITALS — BP 146/74 | HR 70 | Resp 18 | Ht 63.0 in

## 2013-04-25 DIAGNOSIS — M170 Bilateral primary osteoarthritis of knee: Secondary | ICD-10-CM

## 2013-04-25 DIAGNOSIS — M810 Age-related osteoporosis without current pathological fracture: Secondary | ICD-10-CM

## 2013-04-25 DIAGNOSIS — R600 Localized edema: Secondary | ICD-10-CM

## 2013-04-25 DIAGNOSIS — M129 Arthropathy, unspecified: Secondary | ICD-10-CM

## 2013-04-25 DIAGNOSIS — Z9181 History of falling: Secondary | ICD-10-CM

## 2013-04-25 DIAGNOSIS — IMO0002 Reserved for concepts with insufficient information to code with codable children: Secondary | ICD-10-CM

## 2013-04-25 DIAGNOSIS — I10 Essential (primary) hypertension: Secondary | ICD-10-CM

## 2013-04-25 DIAGNOSIS — R609 Edema, unspecified: Secondary | ICD-10-CM

## 2013-04-25 DIAGNOSIS — E785 Hyperlipidemia, unspecified: Secondary | ICD-10-CM

## 2013-04-25 DIAGNOSIS — K5730 Diverticulosis of large intestine without perforation or abscess without bleeding: Secondary | ICD-10-CM

## 2013-04-25 DIAGNOSIS — M1710 Unilateral primary osteoarthritis, unspecified knee: Secondary | ICD-10-CM

## 2013-04-25 DIAGNOSIS — K5790 Diverticulosis of intestine, part unspecified, without perforation or abscess without bleeding: Secondary | ICD-10-CM

## 2013-04-25 LAB — COMPREHENSIVE METABOLIC PANEL
ALBUMIN: 4.1 g/dL (ref 3.5–5.2)
ALK PHOS: 56 U/L (ref 39–117)
ALT: 11 U/L (ref 0–35)
AST: 20 U/L (ref 0–37)
BILIRUBIN TOTAL: 1.2 mg/dL (ref 0.2–1.2)
BUN: 30 mg/dL — ABNORMAL HIGH (ref 6–23)
CO2: 29 mEq/L (ref 19–32)
Calcium: 11 mg/dL — ABNORMAL HIGH (ref 8.4–10.5)
Chloride: 99 mEq/L (ref 96–112)
Creat: 1.29 mg/dL — ABNORMAL HIGH (ref 0.50–1.10)
Glucose, Bld: 75 mg/dL (ref 70–99)
Potassium: 4.7 mEq/L (ref 3.5–5.3)
Sodium: 137 mEq/L (ref 135–145)
Total Protein: 7.5 g/dL (ref 6.0–8.3)

## 2013-04-25 LAB — LIPID PANEL
Cholesterol: 141 mg/dL (ref 0–200)
HDL: 65 mg/dL (ref >39)
LDL CALC: 63 mg/dL (ref 0–99)
TRIGLYCERIDES: 64 mg/dL (ref <150)
Total CHOL/HDL Ratio: 2.2 Ratio
VLDL: 13 mg/dL (ref 0–40)

## 2013-04-25 NOTE — Patient Instructions (Signed)
Annual wellness in 4 month, call if you need me before  Lipid and CMP today  You need the shingles vaccine, stop at our pharmacy today for this if able please  I will attempt to contact your daughter Judy Grant,  I am giving you a note for her

## 2013-04-26 ENCOUNTER — Other Ambulatory Visit: Payer: Self-pay | Admitting: Family Medicine

## 2013-04-28 DIAGNOSIS — M170 Bilateral primary osteoarthritis of knee: Secondary | ICD-10-CM | POA: Insufficient documentation

## 2013-04-28 NOTE — Assessment & Plan Note (Signed)
Controlled, no change in medication  

## 2013-04-28 NOTE — Progress Notes (Signed)
   Subjective:    Patient ID: Judy Grant, female    DOB: 02-Mar-1918, 80 y.o.   MRN: [id number]  HPI The PT is here for follow up and re-evaluation of chronic medical conditions, medication management and review of any available recent lab and radiology data.  Preventive health is updated, specifically   Immunization.   Followed regularly in coumadin clinic to ensure anti coagulation is appropriate The PT denies any adverse reactions to current medications since the last visit.  There are no new concerns. She denies any recent falls, does not have constipation, denies rectal bleeding and reports a good appetite There are no specific complaints       Review of Systems See HPI Denies recent fever or chills. Denies sinus pressure, nasal congestion, ear pain or sore throat. Denies chest congestion, productive cough or wheezing. Denies chest pains, reports improvement in  leg swelling Denies abdominal pain, nausea, vomiting,diarrhea or constipation.   Denies dysuria, frequency, hesitancy or incontinence. Chronic generalized  joint pain, swelling and limitation in mobility unchanged, relies on hydrocodone for relief, managed through orthopedics, does not take regularly by her reporting Denies headaches, seizures, numbness, or tingling. Denies depression, anxiety or insomnia. Denies skin break down or rash.        Objective:   Physical Exam Patient alert  and in no cardiopulmonary distress  HEENT: No facial asymmetry, EOMI, no sinus tenderness,  oropharynx pink and moist.  Neck decreased ROM, no jVd, no adenopathy.  Chest: Clear to auscultation bilaterally.  CVS: S1, S2 , no S3.Systolic murmur  ABD: Soft non tender. Bowel sounds normal.  Ext: Trace  edema  MS: Decreased  ROM spine, shoulders, hips and knees.Wheelchair dependent  Skin: Intact, no ulcerations or rash noted.  Psych: Good eye contact, normal affect. Memory impaired  not anxious or depressed  appearing.  CNS: CN 2-12 intact, power,  normal throughout.Slightly decreased on left upper and ;lower extremities        Assessment & Plan:

## 2013-04-28 NOTE — Assessment & Plan Note (Signed)
No recent GI bleed, close monitoring of coumadin through the clinic

## 2013-04-28 NOTE — Assessment & Plan Note (Signed)
Resolved , continue once daily lasix 40mg  and fluid restriction as has CHF with valvular heart disease

## 2013-04-28 NOTE — Assessment & Plan Note (Signed)
Pt needs to be in an assisted living situation , wants her son  With her and hopefully this is a possibility, otherwise she is unwilling to move, [facility]

## 2013-04-28 NOTE — Assessment & Plan Note (Signed)
Severe osteoarthritis of both knees, managed by ortho, on hydrocodone

## 2013-04-28 NOTE — Assessment & Plan Note (Signed)
Continue fosamax and calcium

## 2013-05-09 ENCOUNTER — Ambulatory Visit (INDEPENDENT_AMBULATORY_CARE_PROVIDER_SITE_OTHER): Payer: PRIVATE HEALTH INSURANCE | Admitting: *Deleted

## 2013-05-09 DIAGNOSIS — I639 Cerebral infarction, unspecified: Secondary | ICD-10-CM

## 2013-05-09 DIAGNOSIS — I6350 Cerebral infarction due to unspecified occlusion or stenosis of unspecified cerebral artery: Secondary | ICD-10-CM

## 2013-05-09 DIAGNOSIS — I4891 Unspecified atrial fibrillation: Secondary | ICD-10-CM

## 2013-05-09 DIAGNOSIS — I480 Paroxysmal atrial fibrillation: Secondary | ICD-10-CM

## 2013-05-09 DIAGNOSIS — Z5181 Encounter for therapeutic drug level monitoring: Secondary | ICD-10-CM | POA: Insufficient documentation

## 2013-05-09 DIAGNOSIS — Z7901 Long term (current) use of anticoagulants: Secondary | ICD-10-CM

## 2013-05-09 LAB — POCT INR: INR: 1.8

## 2013-05-23 ENCOUNTER — Other Ambulatory Visit: Payer: Self-pay | Admitting: Family Medicine

## 2013-05-29 ENCOUNTER — Other Ambulatory Visit: Payer: Self-pay | Admitting: Cardiology

## 2013-05-30 ENCOUNTER — Ambulatory Visit (INDEPENDENT_AMBULATORY_CARE_PROVIDER_SITE_OTHER): Payer: PRIVATE HEALTH INSURANCE | Admitting: *Deleted

## 2013-05-30 DIAGNOSIS — I6350 Cerebral infarction due to unspecified occlusion or stenosis of unspecified cerebral artery: Secondary | ICD-10-CM

## 2013-05-30 DIAGNOSIS — I480 Paroxysmal atrial fibrillation: Secondary | ICD-10-CM

## 2013-05-30 DIAGNOSIS — I639 Cerebral infarction, unspecified: Secondary | ICD-10-CM

## 2013-05-30 DIAGNOSIS — Z5181 Encounter for therapeutic drug level monitoring: Secondary | ICD-10-CM

## 2013-05-30 DIAGNOSIS — Z7901 Long term (current) use of anticoagulants: Secondary | ICD-10-CM

## 2013-05-30 DIAGNOSIS — I4891 Unspecified atrial fibrillation: Secondary | ICD-10-CM

## 2013-05-30 LAB — POCT INR: INR: 2.3

## 2013-06-06 DIAGNOSIS — I4891 Unspecified atrial fibrillation: Secondary | ICD-10-CM

## 2013-06-06 DIAGNOSIS — I509 Heart failure, unspecified: Secondary | ICD-10-CM

## 2013-06-06 DIAGNOSIS — M109 Gout, unspecified: Secondary | ICD-10-CM

## 2013-06-06 DIAGNOSIS — I5033 Acute on chronic diastolic (congestive) heart failure: Secondary | ICD-10-CM

## 2013-06-12 ENCOUNTER — Telehealth: Payer: Self-pay

## 2013-06-12 NOTE — Telephone Encounter (Signed)
Called and notified aide that Dr. Sanjuan DameKeeling's office will need to be contacted for refills on Hydrocodone.  Not prescribed from this office.  Tramadol was discontinued.

## 2013-06-20 ENCOUNTER — Encounter: Payer: Self-pay | Admitting: Family Medicine

## 2013-06-20 ENCOUNTER — Encounter (INDEPENDENT_AMBULATORY_CARE_PROVIDER_SITE_OTHER): Payer: Self-pay

## 2013-06-20 ENCOUNTER — Ambulatory Visit (INDEPENDENT_AMBULATORY_CARE_PROVIDER_SITE_OTHER): Payer: PRIVATE HEALTH INSURANCE | Admitting: Family Medicine

## 2013-06-20 VITALS — BP 140/80 | HR 65 | Resp 16 | Wt 139.1 lb

## 2013-06-20 DIAGNOSIS — Z13820 Encounter for screening for osteoporosis: Secondary | ICD-10-CM

## 2013-06-20 DIAGNOSIS — Z7901 Long term (current) use of anticoagulants: Secondary | ICD-10-CM

## 2013-06-20 DIAGNOSIS — M949 Disorder of cartilage, unspecified: Secondary | ICD-10-CM

## 2013-06-20 DIAGNOSIS — I10 Essential (primary) hypertension: Secondary | ICD-10-CM

## 2013-06-20 DIAGNOSIS — Z9181 History of falling: Secondary | ICD-10-CM

## 2013-06-20 DIAGNOSIS — M899 Disorder of bone, unspecified: Secondary | ICD-10-CM

## 2013-06-20 DIAGNOSIS — Z7189 Other specified counseling: Secondary | ICD-10-CM

## 2013-06-20 DIAGNOSIS — E785 Hyperlipidemia, unspecified: Secondary | ICD-10-CM

## 2013-06-20 DIAGNOSIS — R600 Localized edema: Secondary | ICD-10-CM

## 2013-06-20 NOTE — Patient Instructions (Addendum)
Annual wellness in mid july, call if you need me before  Please call [phone], make an appointment to get checked for hearing aids, you need and want them!  You are referred to occupational therapy for evaluation for power wheelchair  You will be referred for bone density test  CBC, chem 7, tSH   In July, non fasting, and vit D  Use dulcolax tablet one every 3 days for bowel movement, continue daily miralax and stool softenerFall Prevention and Home Safety Falls cause injuries and can affect all age groups. It is possible to prevent falls.  HOW TO PREVENT FALLS  Wear shoes with rubber soles that do not have an opening for your toes.  Keep the inside and outside of your house well lit.  Use night lights throughout your home.  Remove clutter from floors.  Clean up floor spills.  Remove throw rugs or fasten them to the floor with carpet tape.  Do not place electrical cords across pathways.  Put grab bars by your tub, shower, and toilet. Do not use towel bars as grab bars.  Put handrails on both sides of the stairway. Fix loose handrails.  Do not climb on stools or stepladders, if possible.  Do not wax your floors.  Repair uneven or unsafe sidewalks, walkways, or stairs.  Keep items you use a lot within reach.  Be aware of pets.  Keep emergency numbers next to the telephone.  Put smoke detectors in your home and near bedrooms. Ask your doctor what other things you can do to prevent falls. Document Released: 01/08/2009 Document Revised: 09/13/2011 Document Reviewed: 06/14/2011 [facility] Patient Information 2014 [employer] [place].

## 2013-06-20 NOTE — Progress Notes (Signed)
   Subjective:    Patient ID: Judy Grant, female    DOB: 1917-10-16, 80 y.o.   MRN: [id number]  HPI The PT is here for follow up and re-evaluation of chronic medical conditions, medication management and review of any available recent lab and radiology data.  Preventive health is updated, specifically  Cancer screening and Immunization.   Requests help with obtaining a new power wheelchair, for safe and increased mobility in the home The PT denies any adverse reactions to current medications since the last visit.  Feels fairly well, reportedly now only takes tramadol for arthritic pain Denies any obvious blood loss, despite coumadin use Appetite is good and BM still infrequent with constipation. She us advised to use laxative every 3 to 4 days, and continue fi ber and stool softener daily Interested in hearing aids and contact info provided       Review of Systems See HPI Denies recent fever or chills. Denies sinus pressure, nasal congestion, ear pain or sore throat. Denies chest congestion, productive cough or wheezing. Denies chest pains, palpitations and leg swelling Denies abdominal pain, nausea, vomiting,diarrhea Denies dysuria, frequency, hesitancy or incontinence. Chronic joint pain, swelling and limitation in mobility. Denies headaches, seizures, numbness, or tingling. Denies depression, anxiety or insomnia. Denies skin break down or rash.        Objective:   Physical Exam BP 140/80  Pulse 65  Resp 16  Wt 139 lb 1.9 oz (63.104 kg)  SpO2 99% Patient alert and oriented and in no cardiopulmonary distress.  HEENT: No facial asymmetry, EOMI, no sinus tenderness,  oropharynx pink and moist.  Neck decreased though adequate ROM, no JVD, no adenopathy.  Chest: Clear to auscultation bilaterally.  CVS: S1, S2 systolic  murmur, no S3.  ABD: Soft non tender. Bowel sounds normal.  Ext: No edema  WU:[id number]:decreased  ROM spine, shoulders, hips and knees.  Skin:  Intact, no ulcerations or rash noted.  Psych: Good eye contact, normal affect. Memory loss not anxious or depressed appearing.  CNS: CN 2-12 intact, power, grossly  normal throughout.Marked hearing loss        Assessment & Plan:  Hypertension Controlled, no change in medication   Hyperlipidemia Controlled, no change in medication   At high risk for falls No falls, despite high risk. States current w/chair is over 89 y/o , needs a new one, will refer to OT for evaluation for documentation of necessity   Lower extremity edema Resolved, doing well  Long term (current) use of anticoagulants deenies visible bleeding from rectum, nose or bladder. Followed by coumadin clinic, on med due to a fib  Encounter for home safety review for injury prevention Reduction in fall risk and safety in the home discussed , and literature  provided on the discharge instruction sheet as well.

## 2013-06-23 DIAGNOSIS — Z7189 Other specified counseling: Secondary | ICD-10-CM | POA: Insufficient documentation

## 2013-06-23 NOTE — Assessment & Plan Note (Signed)
Reduction in fall risk and safety in the home discussed , and literature  provided on the discharge instruction sheet as well.  

## 2013-06-23 NOTE — Assessment & Plan Note (Signed)
No falls, despite high risk. States current w/chair is over 89 y/o , needs a new one, will refer to OT for evaluation for documentation of necessity

## 2013-06-23 NOTE — Assessment & Plan Note (Signed)
Controlled, no change in medication  

## 2013-06-23 NOTE — Assessment & Plan Note (Signed)
Resolved, doing well

## 2013-06-23 NOTE — Assessment & Plan Note (Signed)
deenies visible bleeding from rectum, nose or bladder. Followed by coumadin clinic, on med due to a fib

## 2013-06-27 ENCOUNTER — Ambulatory Visit (INDEPENDENT_AMBULATORY_CARE_PROVIDER_SITE_OTHER): Payer: PRIVATE HEALTH INSURANCE | Admitting: *Deleted

## 2013-06-27 ENCOUNTER — Other Ambulatory Visit (HOSPITAL_COMMUNITY): Payer: PRIVATE HEALTH INSURANCE

## 2013-06-27 DIAGNOSIS — I480 Paroxysmal atrial fibrillation: Secondary | ICD-10-CM

## 2013-06-27 DIAGNOSIS — I639 Cerebral infarction, unspecified: Secondary | ICD-10-CM

## 2013-06-27 DIAGNOSIS — Z7901 Long term (current) use of anticoagulants: Secondary | ICD-10-CM

## 2013-06-27 DIAGNOSIS — I4891 Unspecified atrial fibrillation: Secondary | ICD-10-CM

## 2013-06-27 DIAGNOSIS — Z5181 Encounter for therapeutic drug level monitoring: Secondary | ICD-10-CM

## 2013-06-27 DIAGNOSIS — I6350 Cerebral infarction due to unspecified occlusion or stenosis of unspecified cerebral artery: Secondary | ICD-10-CM

## 2013-06-27 LAB — POCT INR: INR: 1.9

## 2013-07-01 ENCOUNTER — Ambulatory Visit (HOSPITAL_COMMUNITY)
Admission: RE | Admit: 2013-07-01 | Discharge: 2013-07-01 | Disposition: A | Discharge status: Home / Self Care | Payer: PRIVATE HEALTH INSURANCE | Source: Ambulatory Visit | Attending: Family Medicine | Admitting: Family Medicine

## 2013-07-01 DIAGNOSIS — M25519 Pain in unspecified shoulder: Secondary | ICD-10-CM | POA: Insufficient documentation

## 2013-07-01 DIAGNOSIS — I10 Essential (primary) hypertension: Secondary | ICD-10-CM | POA: Insufficient documentation

## 2013-07-01 DIAGNOSIS — IMO0002 Reserved for concepts with insufficient information to code with codable children: Secondary | ICD-10-CM

## 2013-07-01 DIAGNOSIS — R262 Difficulty in walking, not elsewhere classified: Secondary | ICD-10-CM | POA: Insufficient documentation

## 2013-07-01 DIAGNOSIS — IMO0001 Reserved for inherently not codable concepts without codable children: Secondary | ICD-10-CM | POA: Insufficient documentation

## 2013-07-01 DIAGNOSIS — I4891 Unspecified atrial fibrillation: Secondary | ICD-10-CM | POA: Insufficient documentation

## 2013-07-01 DIAGNOSIS — M19019 Primary osteoarthritis, unspecified shoulder: Secondary | ICD-10-CM | POA: Insufficient documentation

## 2013-07-01 DIAGNOSIS — M6281 Muscle weakness (generalized): Secondary | ICD-10-CM | POA: Insufficient documentation

## 2013-07-01 DIAGNOSIS — M25569 Pain in unspecified knee: Secondary | ICD-10-CM | POA: Insufficient documentation

## 2013-07-01 DIAGNOSIS — M1710 Unilateral primary osteoarthritis, unspecified knee: Secondary | ICD-10-CM | POA: Insufficient documentation

## 2013-07-01 DIAGNOSIS — I69998 Other sequelae following unspecified cerebrovascular disease: Secondary | ICD-10-CM | POA: Insufficient documentation

## 2013-07-01 NOTE — Evaluation (Signed)
Occupational Therapy Evaluation  Patient Details  Name: Judy Grant MRN: [id number] Date of Birth: February 05, 1918  Today's Date: 07/01/2013 Time: 1300-1330 OT Time Calculation (min): 30 min  Visit#: 1 of 1  Re-eval:      07/01/13       Judy Grant is a 80 year old female who has been referred to occupational therapy for assessment for need of a replacement power wheelchair.  Judy Grant  has a medical history that includes CHF, CVA with right side weakness, atrial fib, heart valve disease, gout, chronic kidney disease, left ankle fracture, right hip fracture, bilateral shoulder arthritis, bilateral knee arthritis      Judy Grant lives in a mobile home with her adopted son who has special needs.  She has a caregiver 6 days a week that assists her with her dressing, bathing, light housework, transfers, and in home mobility. She uses her current power wheelchair to move about her home.  However, it is leaking oil and is not functioning properly any longer.      Judy Grant's goal for obtaining a replacement power wheelchair is to increase her functional independence in her home.  She would like to move about her home, and will not be able to do so without a power wheelchair.    A FULL PHYSICAL ASSESSMENT REVEALS THE FOLLOWING  Existing Equipment: Judy Grant has a walker, cane, and a bedside commode.  She has a power wheelchair that is more than 78 years old.  The cost of the repairs necessary to keep the power wheelchair functional is more than the chair is worth.    Transfers: Judy Grant is able to transfer from chair to bed with increased time independently.   Wheelchair Mobility:  Judy Grant is not able to propel a standard wheelchair due to shoulder arthritis.  Ambulation:  Unable to ambulate safely this date. Balance:  WFL static.   Head and Neck: WNL  Trunk: WFL Pelvis: WNL  Hip: Bilateral hip AROM is 50% to 75%.  Knees: Bilateral knee AROM is 50% due to arthritis.  Feet  and Ankles: Bilateral ankle AROM is WFL.  Upper Extremities: Bilateral shoulder AROM is limited to 50% due to arthritis.  Elbow, forearm, wrist, and hand AROM is WFL.  Right upper extremity strength is 3+/5 and left upper extremity strength is 4/5. Lower Extremities: Right lower extremity strength is grossly 3/5 and left lower extremity is grossly 3+/5. Weight Shifting Ability: Judy Grant is independent with weight shifting. Skin Integrity: WFL. Activity Tolerance:  Mrs. Gerre Grant has fair - activity tolerance due to CHF.  GOALS/OBJECTIVE OF SEATING INTERVENTION  Recommendations:  Judy Grant has functional deficits in the areas of mobility and self care, which are a result of the above listed diagnosis.  Specific functional limitations include: The patient is unable to walk due to bilateral knee arthritis. Se is unable to functionally propel a lightweight or standard manual wheelchair for independent mobility within the home due to poor sustained activity tolerance, and breathing issues including CHF, and shoulder arthritis. Judy Grant  will use the power wheelchair on a daily basis as her primary means of mobility.  I recommend a standard sit and drive power wheelchair.  The recommended wheelchair meets current and future positioning needs, accessibility, durability, and safety requirements for functional use within patient's home environment. It is the least expensive power wheelchair that meets the patients current and future needs.   If you require any further information concerning [name]'s positioning, independence or  mobility needs; or any further information why a lesser device will not work, please do not hesitate to contact me at [facility] [place], [address] [place] [address] [phone].   ____________________ __________  Sondra Barges, OTR/L     Date  Past Medical History:  Past Medical History  Diagnosis Date  . Hyperlipidemia   . Osteoporosis   . Hypertension      with severe left ventricular hypertrophy; normal ejection fraction in 04/2005  . Valvular heart disease     Mild to moderate aortic stenosis; moderate to severe mitral stenosis in 2007; mild MR  . Paroxysmal atrial fibrillation     Remote  . Diverticulosis     Pancolonic; h/o LGI bleeding and diverticulitis  . Degenerative joint disease     Knees  . Chronic kidney disease     Mild; creatinine of 1.19-1.28 in recent years  . Anemia, iron deficiency   . Popliteal cyst     Left  . Fracture of wrist 2008    left  . Gout   . Stroke    Past Surgical History:  Past Surgical History  Procedure Laterality Date  . Appendectomy  1944  . Total hip arthroplasty  1994   Problem List Patient Active Problem List   Diagnosis Date Noted  . Encounter for home safety review for injury prevention 06/23/2013  . Encounter for therapeutic drug monitoring 05/09/2013  . Hyperglycemia 03/07/2013  . Acute on chronic diastolic HF (heart failure) 03/06/2013  . Gout attack 03/04/2013  . Lower extremity edema 03/04/2013  . Gout flare 03/04/2013  . Valvular heart disease   . Cyst of hand 11/20/2012  . At high risk for falls 10/09/2012  . Hearing loss 03/14/2012  . Long term (current) use of anticoagulants 03/06/2012  . NSTEMI, initial episode of care 01/05/2012  . CVA (cerebral infarction) 01/05/2012  . Moderate aortic stenosis 01/05/2012  . Moderate mitral stenosis 01/05/2012  . New onset a-fib 01/05/2012  . CKD (chronic kidney disease) stage 3, GFR 30-59 ml/min 01/05/2012  . Swelling of joint of right shoulder 01/05/2012  . Hemiplegia, unspecified, affecting dominant side 01/05/2012  . Ankle fracture, left 12/07/2011  . Chronic kidney disease 09/30/2011  . Hyperlipidemia   . Hypertension   . Diverticulosis   . Paroxysmal atrial fibrillation   . Osteoporosis 11/06/2009  . ARTHRITIS, KNEES, BILATERAL 11/20/2007  . ANEMIA-IRON DEFICIENCY 04/06/2007       GO Functional Limitation: Mobility:  Walking and moving around Mobility: Walking and Moving Around Current Status [phone]): At least 60 percent but less than 80 percent impaired, limited or restricted Mobility: Walking and Moving Around Goal Status [phone]): At least 60 percent but less than 80 percent impaired, limited or restricted Mobility: Walking and Moving Around Discharge Status [phone]): At least 60 percent but less than 80 percent impaired, limited or restricted  Shirlean Mylar, OTR/L  07/01/2013, 4:31 PM  Physician Documentation Your signature is required to indicate approval of the treatment plan as stated above.  Please sign and either send electronically or make a copy of this report for your files and return this physician signed original.  Please mark one 1.__approve of plan  2. ___approve of plan with the following conditions.   ______________________________  _____________________ Physician Signature                                                                                                             Date

## 2013-07-04 ENCOUNTER — Ambulatory Visit (HOSPITAL_COMMUNITY)
Admission: RE | Admit: 2013-07-04 | Discharge: 2013-07-04 | Disposition: A | Discharge status: Home / Self Care | Payer: PRIVATE HEALTH INSURANCE | Source: Ambulatory Visit | Attending: Family Medicine | Admitting: Family Medicine

## 2013-07-04 DIAGNOSIS — Z13820 Encounter for screening for osteoporosis: Secondary | ICD-10-CM | POA: Insufficient documentation

## 2013-07-15 ENCOUNTER — Encounter: Payer: Self-pay | Admitting: Family Medicine

## 2013-07-17 DIAGNOSIS — I5033 Acute on chronic diastolic (congestive) heart failure: Secondary | ICD-10-CM

## 2013-07-17 DIAGNOSIS — M109 Gout, unspecified: Secondary | ICD-10-CM

## 2013-07-17 DIAGNOSIS — I509 Heart failure, unspecified: Secondary | ICD-10-CM

## 2013-07-17 DIAGNOSIS — I4891 Unspecified atrial fibrillation: Secondary | ICD-10-CM

## 2013-07-18 ENCOUNTER — Ambulatory Visit (INDEPENDENT_AMBULATORY_CARE_PROVIDER_SITE_OTHER): Payer: PRIVATE HEALTH INSURANCE | Admitting: *Deleted

## 2013-07-18 DIAGNOSIS — I480 Paroxysmal atrial fibrillation: Secondary | ICD-10-CM

## 2013-07-18 DIAGNOSIS — I639 Cerebral infarction, unspecified: Secondary | ICD-10-CM

## 2013-07-18 DIAGNOSIS — I4891 Unspecified atrial fibrillation: Secondary | ICD-10-CM

## 2013-07-18 DIAGNOSIS — Z7901 Long term (current) use of anticoagulants: Secondary | ICD-10-CM

## 2013-07-18 DIAGNOSIS — Z5181 Encounter for therapeutic drug level monitoring: Secondary | ICD-10-CM

## 2013-07-18 DIAGNOSIS — I6350 Cerebral infarction due to unspecified occlusion or stenosis of unspecified cerebral artery: Secondary | ICD-10-CM

## 2013-07-18 LAB — POCT INR: INR: 1.5

## 2013-07-23 ENCOUNTER — Other Ambulatory Visit: Payer: Self-pay | Admitting: Family Medicine

## 2013-07-24 ENCOUNTER — Other Ambulatory Visit: Payer: Self-pay | Admitting: Family Medicine

## 2013-08-01 ENCOUNTER — Ambulatory Visit (INDEPENDENT_AMBULATORY_CARE_PROVIDER_SITE_OTHER): Payer: PRIVATE HEALTH INSURANCE | Admitting: *Deleted

## 2013-08-01 DIAGNOSIS — I6350 Cerebral infarction due to unspecified occlusion or stenosis of unspecified cerebral artery: Secondary | ICD-10-CM

## 2013-08-01 DIAGNOSIS — Z5181 Encounter for therapeutic drug level monitoring: Secondary | ICD-10-CM

## 2013-08-01 DIAGNOSIS — I480 Paroxysmal atrial fibrillation: Secondary | ICD-10-CM

## 2013-08-01 DIAGNOSIS — I639 Cerebral infarction, unspecified: Secondary | ICD-10-CM

## 2013-08-01 DIAGNOSIS — Z7901 Long term (current) use of anticoagulants: Secondary | ICD-10-CM

## 2013-08-01 DIAGNOSIS — I4891 Unspecified atrial fibrillation: Secondary | ICD-10-CM

## 2013-08-01 LAB — POCT INR: INR: 1.8

## 2013-08-15 ENCOUNTER — Other Ambulatory Visit: Payer: Self-pay | Admitting: Family Medicine

## 2013-08-15 ENCOUNTER — Ambulatory Visit (INDEPENDENT_AMBULATORY_CARE_PROVIDER_SITE_OTHER): Payer: PRIVATE HEALTH INSURANCE | Admitting: *Deleted

## 2013-08-15 DIAGNOSIS — I4891 Unspecified atrial fibrillation: Secondary | ICD-10-CM

## 2013-08-15 DIAGNOSIS — Z7901 Long term (current) use of anticoagulants: Secondary | ICD-10-CM

## 2013-08-15 DIAGNOSIS — I480 Paroxysmal atrial fibrillation: Secondary | ICD-10-CM

## 2013-08-15 DIAGNOSIS — Z5181 Encounter for therapeutic drug level monitoring: Secondary | ICD-10-CM

## 2013-08-15 DIAGNOSIS — I6350 Cerebral infarction due to unspecified occlusion or stenosis of unspecified cerebral artery: Secondary | ICD-10-CM

## 2013-08-15 DIAGNOSIS — I639 Cerebral infarction, unspecified: Secondary | ICD-10-CM

## 2013-08-15 LAB — POCT INR: INR: 3

## 2013-08-28 ENCOUNTER — Other Ambulatory Visit: Payer: Self-pay | Admitting: Cardiovascular Disease

## 2013-09-03 ENCOUNTER — Other Ambulatory Visit: Payer: Self-pay | Admitting: Adult Health

## 2013-09-05 ENCOUNTER — Ambulatory Visit (INDEPENDENT_AMBULATORY_CARE_PROVIDER_SITE_OTHER): Payer: PRIVATE HEALTH INSURANCE | Admitting: *Deleted

## 2013-09-05 DIAGNOSIS — Z7901 Long term (current) use of anticoagulants: Secondary | ICD-10-CM

## 2013-09-05 DIAGNOSIS — I4891 Unspecified atrial fibrillation: Secondary | ICD-10-CM

## 2013-09-05 DIAGNOSIS — I480 Paroxysmal atrial fibrillation: Secondary | ICD-10-CM

## 2013-09-05 DIAGNOSIS — I6350 Cerebral infarction due to unspecified occlusion or stenosis of unspecified cerebral artery: Secondary | ICD-10-CM

## 2013-09-05 DIAGNOSIS — Z5181 Encounter for therapeutic drug level monitoring: Secondary | ICD-10-CM

## 2013-09-05 DIAGNOSIS — I639 Cerebral infarction, unspecified: Secondary | ICD-10-CM

## 2013-09-05 LAB — POCT INR: INR: 3.2

## 2013-09-20 ENCOUNTER — Other Ambulatory Visit: Payer: Self-pay | Admitting: Family Medicine

## 2013-09-23 DIAGNOSIS — I5033 Acute on chronic diastolic (congestive) heart failure: Secondary | ICD-10-CM

## 2013-09-23 DIAGNOSIS — I4891 Unspecified atrial fibrillation: Secondary | ICD-10-CM

## 2013-09-23 DIAGNOSIS — M109 Gout, unspecified: Secondary | ICD-10-CM

## 2013-09-23 DIAGNOSIS — I509 Heart failure, unspecified: Secondary | ICD-10-CM

## 2013-10-03 ENCOUNTER — Ambulatory Visit (INDEPENDENT_AMBULATORY_CARE_PROVIDER_SITE_OTHER): Payer: PRIVATE HEALTH INSURANCE | Admitting: *Deleted

## 2013-10-03 DIAGNOSIS — I6350 Cerebral infarction due to unspecified occlusion or stenosis of unspecified cerebral artery: Secondary | ICD-10-CM

## 2013-10-03 DIAGNOSIS — I63239 Cerebral infarction due to unspecified occlusion or stenosis of unspecified carotid arteries: Secondary | ICD-10-CM

## 2013-10-03 DIAGNOSIS — Z7901 Long term (current) use of anticoagulants: Secondary | ICD-10-CM

## 2013-10-03 DIAGNOSIS — I4891 Unspecified atrial fibrillation: Secondary | ICD-10-CM

## 2013-10-03 DIAGNOSIS — I480 Paroxysmal atrial fibrillation: Secondary | ICD-10-CM

## 2013-10-03 DIAGNOSIS — Z5181 Encounter for therapeutic drug level monitoring: Secondary | ICD-10-CM

## 2013-10-03 DIAGNOSIS — I639 Cerebral infarction, unspecified: Secondary | ICD-10-CM

## 2013-10-03 LAB — POCT INR: INR: 2

## 2013-10-21 ENCOUNTER — Other Ambulatory Visit: Payer: Self-pay | Admitting: Family Medicine

## 2013-10-21 LAB — CBC WITH DIFFERENTIAL/PLATELET
BASOS ABS: 0 10*3/uL (ref 0.0–0.1)
Basophils Relative: 1 % (ref 0–1)
EOS PCT: 2 % (ref 0–5)
Eosinophils Absolute: 0.1 10*3/uL (ref 0.0–0.7)
HCT: 30.4 % — ABNORMAL LOW (ref 36.0–46.0)
Hemoglobin: 10.8 g/dL — ABNORMAL LOW (ref 12.0–15.0)
Lymphocytes Relative: 33 % (ref 12–46)
Lymphs Abs: 1.3 10*3/uL (ref 0.7–4.0)
MCH: 32.8 pg (ref 26.0–34.0)
MCHC: 35.5 g/dL (ref 30.0–36.0)
MCV: 92.4 fL (ref 78.0–100.0)
Monocytes Absolute: 0.3 10*3/uL (ref 0.1–1.0)
Monocytes Relative: 8 % (ref 3–12)
NEUTROS ABS: 2.2 10*3/uL (ref 1.7–7.7)
Neutrophils Relative %: 56 % (ref 43–77)
Platelets: 161 10*3/uL (ref 150–400)
RBC: 3.29 MIL/uL — ABNORMAL LOW (ref 3.87–5.11)
RDW: 13.9 % (ref 11.5–15.5)
WBC: 4 10*3/uL (ref 4.0–10.5)

## 2013-10-21 LAB — BASIC METABOLIC PANEL
BUN: 26 mg/dL — ABNORMAL HIGH (ref 6–23)
CO2: 30 mEq/L (ref 19–32)
CREATININE: 1.44 mg/dL — AB (ref 0.50–1.10)
Calcium: 10.7 mg/dL — ABNORMAL HIGH (ref 8.4–10.5)
Chloride: 102 mEq/L (ref 96–112)
Glucose, Bld: 76 mg/dL (ref 70–99)
Potassium: 4.7 mEq/L (ref 3.5–5.3)
Sodium: 139 mEq/L (ref 135–145)

## 2013-10-21 LAB — TSH: TSH: 0.838 u[IU]/mL (ref 0.350–4.500)

## 2013-10-22 LAB — VITAMIN D 25 HYDROXY (VIT D DEFICIENCY, FRACTURES): VIT D 25 HYDROXY: 55 ng/mL (ref 30–89)

## 2013-10-24 ENCOUNTER — Encounter: Payer: Self-pay | Admitting: Family Medicine

## 2013-10-24 ENCOUNTER — Ambulatory Visit (INDEPENDENT_AMBULATORY_CARE_PROVIDER_SITE_OTHER): Payer: PRIVATE HEALTH INSURANCE | Admitting: Family Medicine

## 2013-10-24 VITALS — BP 180/84 | HR 82 | Resp 16 | Ht 63.0 in | Wt 150.0 lb

## 2013-10-24 DIAGNOSIS — Z0000 Encounter for general adult medical examination without abnormal findings: Secondary | ICD-10-CM

## 2013-10-24 DIAGNOSIS — Z23 Encounter for immunization: Secondary | ICD-10-CM | POA: Insufficient documentation

## 2013-10-24 LAB — LIPID PANEL
CHOL/HDL RATIO: 1.9 ratio
Cholesterol: 128 mg/dL (ref 0–200)
HDL: 67 mg/dL (ref >39)
LDL Cholesterol: 50 mg/dL (ref 0–99)
TRIGLYCERIDES: 55 mg/dL (ref <150)
VLDL: 11 mg/dL (ref 0–40)

## 2013-10-24 LAB — HEPATIC FUNCTION PANEL
ALBUMIN: 3.9 g/dL (ref 3.5–5.2)
ALT: 11 U/L (ref 0–35)
AST: 18 U/L (ref 0–37)
Alkaline Phosphatase: 42 U/L (ref 39–117)
BILIRUBIN TOTAL: 1.1 mg/dL (ref 0.2–1.2)
Bilirubin, Direct: 0.2 mg/dL (ref 0.0–0.3)
Indirect Bilirubin: 0.9 mg/dL (ref 0.2–1.2)
Total Protein: 7 g/dL (ref 6.0–8.3)

## 2013-10-24 NOTE — Patient Instructions (Signed)
F/u in 4 month, call if you need me before   Pneumonia vaccine today   pLEASE work on getting hearing aids through [employer] , call if problems  Labs are good   No FALLS

## 2013-10-24 NOTE — Assessment & Plan Note (Signed)
Annual exam as documented. Counseling done  re healthy lifestyle involving commitment to 150 minutes exercise per week, heart healthy diet, and attaining healthy weight.The importance of adequate sleep also discussed. Regular seat belt use and sfe storage  of firearmas if patient has them, is also discussed. Changes in health habits are decided on by the patient with goals and time frames  set for achieving them. Immunization and cancer screening needs are specifically addressed at this visit.

## 2013-10-24 NOTE — Assessment & Plan Note (Signed)
Vaccine administered.

## 2013-10-24 NOTE — Progress Notes (Signed)
Subjective:    Patient ID: Judy Grant, female    DOB: 01/03/1918, 80 y.o.   MRN: [id number]  HPI Preventive Screening-Counseling & Management   Patient present here today for a subsequent Medicare annual wellness visit.   Current Problems (verified)   Medications Prior to Visit Allergies (verified)   PAST HISTORY  Family History (verified)   Social History Widowed, 1 adopted son who lives in the home    Risk Factors  Current exercise habits: Is bound to her wheelchair usually. Sometimes she can get up with a walker if she has assistance and walk around some , has hoveraround  Dietary issues discussed: Eats fruits and vegetables. Encouraged to limit fried foods and red meat. And better choices are chicken fish and [place]   Cardiac risk factors: valvular heart disease, established CAD , already had a STEMI  Depression Screen  (Note: if answer to either of the following is "Yes", a more complete depression screening is indicated)   Over the past two weeks, have you felt down, depressed or hopeless? No  Over the past two weeks, have you felt little interest or pleasure in doing things? No  Have you lost interest or pleasure in daily life? No  Do you often feel hopeless? No  Do you cry easily over simple problems? No   Activities of Daily Living  In your present state of health, do you have any difficulty performing the following activities?  Driving?: Doesn't drive. Aid transports or she rides the [employer]  Managing money?: her nephew manages her bills for her  Feeding yourself?:No Getting from bed to chair?: Can do herself  Climbing a flight of stairs?: Cannot climb stairs  Preparing food and eating?: She has family members or aid prepare food for her  Bathing or showering?: with assistance  Getting dressed?: Can do herself. Sometimes gets help  Getting to the toilet?:Uses a bedside commode  Using the toilet?:No Moving around from place to place?: In her  wheelchair, sometimes with walker with assistance   Fall Risk Assessment In the past year have you fallen or had a near fall?:No Are you currently taking any medications that make you dizzy?:No   Hearing Difficulties: No Do you often ask people to speak up or repeat themselves?: yes, has a hard time hearing  Do you experience ringing or noises in your ears?:yes, sometimes  Do you have difficulty understanding soft or whispered voices?: yes  Cognitive Testing  Alert? Yes Normal Appearance?Yes  Oriented to person? Yes Place? Yes  Time? Yes  Displays appropriate judgment?Yes  Can read the correct time from a watch face? yes Are you having problems remembering things? Does states that she has problems with her memory   Advanced Directives have been discussed with the patient? Yes, brochure given and discussed with her and her aid , full code   List the Names of Other Physician/Practitioners you currently use:  Laqueta LindenSuresh A Koneswaran (cardiology)   Indicate any recent Medical Services you may have received from other than [facility] providers in the past year (date may be approximate).   Assessment:    Annual Wellness Exam   Plan:    Medicare Attestation  I have personally reviewed:  The patient's medical and social history  Their use of alcohol, tobacco or illicit drugs  Their current medications and supplements  The patient's functional ability including ADLs,fall risks, home safety risks, cognitive, and hearing and visual impairment  Diet and physical activities  Evidence for  depression or mood disorders  The patient's weight, height, BMI, and visual acuity have been recorded in the chart. I have made referrals, counseling, and provided education to the patient based on review of the above and I have provided the patient with a written personalized care plan for preventive services.      Review of Systems     Objective:   Physical Exam        Assessment & Plan:    Medicare annual wellness visit, subsequent Annual exam as documented. Counseling done  re healthy lifestyle involving commitment to 150 minutes exercise per week, heart healthy diet, and attaining healthy weight.The importance of adequate sleep also discussed. Regular seat belt use and sfe storage  of firearmas if patient has them, is also discussed. Changes in health habits are decided on by the patient with goals and time frames  set for achieving them. Immunization and cancer screening needs are specifically addressed at this visit.   Need for vaccination with 13-polyvalent pneumococcal conjugate vaccine Vaccine administered

## 2013-10-27 ENCOUNTER — Encounter: Payer: Self-pay | Admitting: Family Medicine

## 2013-10-28 ENCOUNTER — Other Ambulatory Visit: Payer: Self-pay | Admitting: Adult Health

## 2013-10-28 ENCOUNTER — Other Ambulatory Visit: Payer: Self-pay | Admitting: Family Medicine

## 2013-10-28 NOTE — Addendum Note (Signed)
Addended by: Dejia Ebron B on: 10/28/2013 09:33 AM   Modules accepted: Orders  

## 2013-10-31 ENCOUNTER — Ambulatory Visit (INDEPENDENT_AMBULATORY_CARE_PROVIDER_SITE_OTHER): Payer: PRIVATE HEALTH INSURANCE | Admitting: *Deleted

## 2013-10-31 DIAGNOSIS — I6350 Cerebral infarction due to unspecified occlusion or stenosis of unspecified cerebral artery: Secondary | ICD-10-CM

## 2013-10-31 DIAGNOSIS — Z5181 Encounter for therapeutic drug level monitoring: Secondary | ICD-10-CM

## 2013-10-31 DIAGNOSIS — I639 Cerebral infarction, unspecified: Secondary | ICD-10-CM

## 2013-10-31 DIAGNOSIS — I6330 Cerebral infarction due to thrombosis of unspecified cerebral artery: Secondary | ICD-10-CM

## 2013-10-31 DIAGNOSIS — Z7901 Long term (current) use of anticoagulants: Secondary | ICD-10-CM

## 2013-10-31 DIAGNOSIS — I4891 Unspecified atrial fibrillation: Secondary | ICD-10-CM

## 2013-10-31 DIAGNOSIS — I480 Paroxysmal atrial fibrillation: Secondary | ICD-10-CM

## 2013-10-31 LAB — POCT INR: INR: 2.4

## 2013-11-11 DIAGNOSIS — I509 Heart failure, unspecified: Secondary | ICD-10-CM

## 2013-11-11 DIAGNOSIS — I5033 Acute on chronic diastolic (congestive) heart failure: Secondary | ICD-10-CM

## 2013-11-11 DIAGNOSIS — M109 Gout, unspecified: Secondary | ICD-10-CM

## 2013-11-11 DIAGNOSIS — I4891 Unspecified atrial fibrillation: Secondary | ICD-10-CM

## 2013-11-28 ENCOUNTER — Ambulatory Visit (INDEPENDENT_AMBULATORY_CARE_PROVIDER_SITE_OTHER): Payer: PRIVATE HEALTH INSURANCE | Admitting: Pharmacist

## 2013-11-28 DIAGNOSIS — I6330 Cerebral infarction due to thrombosis of unspecified cerebral artery: Secondary | ICD-10-CM

## 2013-11-28 DIAGNOSIS — I6350 Cerebral infarction due to unspecified occlusion or stenosis of unspecified cerebral artery: Secondary | ICD-10-CM

## 2013-11-28 DIAGNOSIS — I639 Cerebral infarction, unspecified: Secondary | ICD-10-CM

## 2013-11-28 DIAGNOSIS — Z5181 Encounter for therapeutic drug level monitoring: Secondary | ICD-10-CM

## 2013-11-28 DIAGNOSIS — I4891 Unspecified atrial fibrillation: Secondary | ICD-10-CM

## 2013-11-28 DIAGNOSIS — I480 Paroxysmal atrial fibrillation: Secondary | ICD-10-CM

## 2013-11-28 DIAGNOSIS — Z7901 Long term (current) use of anticoagulants: Secondary | ICD-10-CM

## 2013-11-28 LAB — POCT INR: INR: 1.7

## 2013-12-03 ENCOUNTER — Ambulatory Visit (INDEPENDENT_AMBULATORY_CARE_PROVIDER_SITE_OTHER): Payer: PRIVATE HEALTH INSURANCE

## 2013-12-03 DIAGNOSIS — Z23 Encounter for immunization: Secondary | ICD-10-CM

## 2013-12-06 ENCOUNTER — Other Ambulatory Visit: Payer: Self-pay | Admitting: Family Medicine

## 2013-12-19 ENCOUNTER — Ambulatory Visit (INDEPENDENT_AMBULATORY_CARE_PROVIDER_SITE_OTHER): Payer: PRIVATE HEALTH INSURANCE | Admitting: *Deleted

## 2013-12-19 DIAGNOSIS — Z7901 Long term (current) use of anticoagulants: Secondary | ICD-10-CM

## 2013-12-19 DIAGNOSIS — I639 Cerebral infarction, unspecified: Secondary | ICD-10-CM

## 2013-12-19 DIAGNOSIS — I6350 Cerebral infarction due to unspecified occlusion or stenosis of unspecified cerebral artery: Secondary | ICD-10-CM

## 2013-12-19 DIAGNOSIS — I4891 Unspecified atrial fibrillation: Secondary | ICD-10-CM

## 2013-12-19 DIAGNOSIS — I480 Paroxysmal atrial fibrillation: Secondary | ICD-10-CM

## 2013-12-19 DIAGNOSIS — Z5181 Encounter for therapeutic drug level monitoring: Secondary | ICD-10-CM

## 2013-12-19 LAB — POCT INR: INR: 3.3

## 2013-12-23 ENCOUNTER — Other Ambulatory Visit: Payer: Self-pay | Admitting: Adult Health

## 2013-12-23 ENCOUNTER — Other Ambulatory Visit: Payer: Self-pay | Admitting: Cardiovascular Disease

## 2013-12-24 NOTE — Telephone Encounter (Signed)
Medication refilled

## 2014-01-13 ENCOUNTER — Ambulatory Visit (INDEPENDENT_AMBULATORY_CARE_PROVIDER_SITE_OTHER): Payer: PRIVATE HEALTH INSURANCE | Admitting: *Deleted

## 2014-01-13 DIAGNOSIS — I480 Paroxysmal atrial fibrillation: Secondary | ICD-10-CM

## 2014-01-13 DIAGNOSIS — Z7901 Long term (current) use of anticoagulants: Secondary | ICD-10-CM

## 2014-01-13 DIAGNOSIS — Z5181 Encounter for therapeutic drug level monitoring: Secondary | ICD-10-CM

## 2014-01-13 DIAGNOSIS — I639 Cerebral infarction, unspecified: Secondary | ICD-10-CM

## 2014-01-13 LAB — POCT INR: INR: 1.9

## 2014-01-27 ENCOUNTER — Telehealth: Payer: Self-pay | Admitting: Cardiovascular Disease

## 2014-01-27 NOTE — Telephone Encounter (Signed)
Patient's caretaker has questions regarding her fluid pill / tgs

## 2014-01-27 NOTE — Telephone Encounter (Signed)
lmtcb

## 2014-01-28 NOTE — Telephone Encounter (Signed)
Pt needs post hosp fu since last December,apt mad with NP this week.Caretaker wonders if pt can decrease her lasix as she can barely hold bladder to bathroom.I told her this would be discussed at apt

## 2014-01-30 ENCOUNTER — Ambulatory Visit (HOSPITAL_COMMUNITY)
Admission: RE | Admit: 2014-01-30 | Discharge: 2014-01-30 | Disposition: A | Discharge status: Home / Self Care | Payer: PRIVATE HEALTH INSURANCE | Source: Ambulatory Visit | Attending: Adult Health | Admitting: Adult Health

## 2014-01-30 ENCOUNTER — Encounter: Payer: Self-pay | Admitting: Adult Health

## 2014-01-30 ENCOUNTER — Ambulatory Visit (INDEPENDENT_AMBULATORY_CARE_PROVIDER_SITE_OTHER): Payer: PRIVATE HEALTH INSURANCE | Admitting: Adult Health

## 2014-01-30 VITALS — BP 120/70 | HR 74 | Ht 68.0 in | Wt 145.0 lb

## 2014-01-30 DIAGNOSIS — R059 Cough, unspecified: Secondary | ICD-10-CM

## 2014-01-30 DIAGNOSIS — R05 Cough: Secondary | ICD-10-CM

## 2014-01-30 DIAGNOSIS — I5033 Acute on chronic diastolic (congestive) heart failure: Secondary | ICD-10-CM

## 2014-01-30 DIAGNOSIS — I5032 Chronic diastolic (congestive) heart failure: Secondary | ICD-10-CM

## 2014-01-30 DIAGNOSIS — I517 Cardiomegaly: Secondary | ICD-10-CM | POA: Insufficient documentation

## 2014-01-30 DIAGNOSIS — J449 Chronic obstructive pulmonary disease, unspecified: Secondary | ICD-10-CM | POA: Diagnosis not present

## 2014-01-30 DIAGNOSIS — N183 Chronic kidney disease, stage 3 (moderate): Secondary | ICD-10-CM

## 2014-01-30 DIAGNOSIS — M109 Gout, unspecified: Secondary | ICD-10-CM

## 2014-01-30 DIAGNOSIS — I4891 Unspecified atrial fibrillation: Secondary | ICD-10-CM

## 2014-01-30 DIAGNOSIS — I38 Endocarditis, valve unspecified: Secondary | ICD-10-CM

## 2014-01-30 NOTE — Patient Instructions (Addendum)
Your physician recommends that you schedule a follow-up appointment in: 3 weeks   A chest x-ray takes a picture of the organs and structures inside the chest, including the heart, lungs, and blood vessels. This test can show several things, including, whether the heart is enlarges; whether fluid is building up in the lungs; and whether pacemaker / defibrillator leads are still in place.  Your physician recommends that you continue on your current medications as directed. Please refer to the Current Medication list given to you today.  OVER THE COUNTER MEDICATIONS RECOMMENDED   ROBITUSSIN FOR COUGH  CLARITIN (Loratadine) 10 MG AS NEEDED FOR RUNNY NOSE AND CONGESTION  Your physician recommends that you return for lab work BMP/CBC   Thank you for choosing [facility]!!

## 2014-01-30 NOTE — Progress Notes (Signed)
HPI: [name] is a 80 year old patient of Dr. Purvis Grant, that we follow for ongoing assessment and management of valvular heart disease in the setting of rheumatic heart disease. She has both aortic and mitral valve stenosis, paroxysmal atrial fibrillation, CVA, hypertension, iron deficiency anemia, and hyperlipidemia. She was last seen by Dr. Purvis Grant on 03/04/2013. Most recent echocardiogram was completed in October of 2013 revealing normal. Systolic function with an EF of 60-65%.   The last office visit. She has had complaints of right leg swelling and had been ruled out for DVT. She is continued on Coumadin therapy. It was felt that she was having an acute gout flare. It is recommended. The patient be admitted concerning her gout, and right leg swelling. Close monitoring of kidney function was also recommended.   She was admitted on 03/04/2013, and had a five-day stay. She was treated for chronic diastolic heart failure, and gout flareup. She was again checked for DVT. This is been negative. Echocardiogram revealed EF of 35% with grade 2 diastolic dysfunction, as well as moderate left ear, and moderate MS. She diuresed 2 L. Her gout flare was improved with steroids.  She comes today with complaints of coughing and congestion, runny nose. She has chronic LEE. No pain in her chest or palpitations. She is not adhering to low sodium diet.  No Known Allergies  Current Outpatient Prescriptions  Medication Sig Dispense Refill  . alendronate (FOSAMAX) 70 MG tablet TAKE 1 TAB EACH WEEK 30 MIN PRIOR TO BREAKFAST WITH LARGE GLASS OF WATER. REMAIN UPRIGHT. 4 tablet 3  . allopurinol (ZYLOPRIM) 100 MG tablet TAKE ONE TABLET BY MOUTH ONCE DAILY. 30 tablet 3  . ASPIRIN LOW DOSE 81 MG EC tablet TAKE ONE TABLET BY MOUTH ONCE DAILY. 30 tablet 3  . Calcium Carb-Cholecalciferol 500-400 MG-UNIT TABS TAKE (1) TABLET BY MOUTH (3) TIMES DAILY. 90 tablet 6  . COLACE 50 MG capsule TAKE (1) CAPSULE BY MOUTH  TWICE DAILY. 60 capsule 3  . fluticasone (FLONASE) 50 MCG/ACT nasal spray USE 1 SPRAY IN EACH NOSTRIL ONCE DAILY. 16 g 2  . furosemide (LASIX) 40 MG tablet TAKE ONE TABLET BY MOUTH ONCE DAILY. 30 tablet 0  . HYDROcodone-acetaminophen (NORCO/VICODIN) 5-325 MG per tablet Take 1 tablet by mouth as needed.     . metoprolol (LOPRESSOR) 50 MG tablet TAKE 1 & 1/2 TABLETS BY MOUTH EACH MORNING. 75 tablet 3  . polyethylene glycol powder (GLYCOLAX/MIRALAX) powder Take 17 g by mouth daily as needed (for constipation).    . potassium chloride (K-DUR) 10 MEQ tablet TAKE 1 TABLET BY MOUTH ON MONDAY,WEDNESDAY AND FRIDAY. 12 tablet 3  . simvastatin (ZOCOR) 20 MG tablet TAKE (1) TABLET BY MOUTH AT BEDTIME FOR CHOLESTEROL. 30 tablet 3  . valsartan (DIOVAN) 160 MG tablet Take 1 tablet (160 mg total) by mouth daily. 30 tablet 6  . warfarin (COUMADIN) 1 MG tablet TAKE 1 TABLET BY MOUTH ONCE A DAY,ALONG WITH 2.5 MG DAILY TO EQUAL 3.5 MG. 30 tablet 3  . warfarin (COUMADIN) 2.5 MG tablet TAKE 1 TABLET BY MOUTH ONCE A DAY,ALONG WITH 1 MG TO EQUAL 3.5 MG DAILY. 30 tablet 3  . Wheat Dextrin (BENEFIBER PO) Take 1 each by mouth daily.     No current facility-administered medications for this visit.    Past Medical History  Diagnosis Date  . Hyperlipidemia   . Osteoporosis   . Hypertension     with severe left ventricular hypertrophy; normal ejection fraction in 04/2005  .  Valvular heart disease     Mild to moderate aortic stenosis; moderate to severe mitral stenosis in 2007; mild MR  . Paroxysmal atrial fibrillation     Remote  . Diverticulosis     Pancolonic; h/o LGI bleeding and diverticulitis  . Degenerative joint disease     Knees  . Chronic kidney disease     Mild; creatinine of 1.19-1.28 in recent years  . Anemia, iron deficiency   . Popliteal cyst     Left  . Fracture of wrist 2008    left  . Gout   . Stroke     Past Surgical History  Procedure Laterality Date  . Appendectomy  1944  . Total hip  arthroplasty  1994    ROS: Review of systems complete and found to be negative unless listed above  PHYSICAL EXAM BP 120/70 mmHg  Pulse 74  Ht 5\' 8"  (1.727 m)  Wt 145 lb (65.772 kg)  BMI 22.05 kg/m2 . General: Well developed, well nourished, in no acute distress Head: Eyes PERRLA, No xanthomas.   Normal cephalic and atramatic  Lungs: Bibasilar crackles, worse in the RLL.  Heart: HRRR S1 S2, without MRG.  Pulses are 2+ & equal.            No carotid bruit. No JVD.  No abdominal bruits. No femoral bruits. Abdomen: Bowel sounds are positive, abdomen soft and non-tender without masses or                  Hernia's noted. Msk:  Back normal, using wheel chair bound. Diminished  strength and tone for age. Extremities: No clubbing, cyanosis chronic 2+ pretibial edema.  DP +1 Neuro: Alert and oriented X 3. Psych:  Good affect, responds appropriately   ASSESSMENT AND PLAN

## 2014-01-30 NOTE — Assessment & Plan Note (Addendum)
Complaining of cough and congestion, Runny nose. Has crackles in the bases bilaterally. Will have a CXR completed. She will be given Robitussin cough liquid and claritin for runny nose. BMET is to be gotten from  Dr. Luther ParodySimpsons office. Will see her back in a couple of weeks. Will not change diuretic as she requested as she is still eating salty foods.

## 2014-01-30 NOTE — Progress Notes (Deleted)
Name: Judy Grant    DOB: 07/10/1917  Age: 80 y.o.  MR#: [id number]       PCP:  [name], MD      Insurance: Payor: Cleatrice Burke MEDICARE / Plan: Ollen Gross / Product Type: *No Product type* /   CC:    Chief Complaint  Patient presents with  . Aortic Insuffiency  . Atrial Fibrillation    PAF    VS Filed Vitals:   01/30/14 1423  BP: 120/70  Pulse: 74  Height: 5\' 8"  (1.727 m)  Weight: 145 lb (65.772 kg)    Weights Current Weight  01/30/14 145 lb (65.772 kg)  10/24/13 150 lb (68.04 kg)  06/20/13 139 lb 1.9 oz (63.104 kg)    Blood Pressure  BP Readings from Last 3 Encounters:  01/30/14 120/70  10/24/13 180/84  06/20/13 140/80     Admit date:  (Not on file) Last encounter with RMR:  12/23/2013   Allergy Review of patient's allergies indicates no known allergies.  Current Outpatient Prescriptions  Medication Sig Dispense Refill  . alendronate (FOSAMAX) 70 MG tablet TAKE 1 TAB EACH WEEK 30 MIN PRIOR TO BREAKFAST WITH LARGE GLASS OF WATER. REMAIN UPRIGHT. 4 tablet 3  . allopurinol (ZYLOPRIM) 100 MG tablet TAKE ONE TABLET BY MOUTH ONCE DAILY. 30 tablet 3  . ASPIRIN LOW DOSE 81 MG EC tablet TAKE ONE TABLET BY MOUTH ONCE DAILY. 30 tablet 3  . Calcium Carb-Cholecalciferol 500-400 MG-UNIT TABS TAKE (1) TABLET BY MOUTH (3) TIMES DAILY. 90 tablet 6  . COLACE 50 MG capsule TAKE (1) CAPSULE BY MOUTH TWICE DAILY. 60 capsule 3  . fluticasone (FLONASE) 50 MCG/ACT nasal spray USE 1 SPRAY IN EACH NOSTRIL ONCE DAILY. 16 g 2  . furosemide (LASIX) 40 MG tablet TAKE ONE TABLET BY MOUTH ONCE DAILY. 30 tablet 0  . HYDROcodone-acetaminophen (NORCO/VICODIN) 5-325 MG per tablet Take 1 tablet by mouth as needed.     . metoprolol (LOPRESSOR) 50 MG tablet TAKE 1 & 1/2 TABLETS BY MOUTH EACH MORNING. 75 tablet 3  . polyethylene glycol powder (GLYCOLAX/MIRALAX) powder Take 17 g by mouth daily as needed (for constipation).    . potassium chloride (K-DUR) 10 MEQ tablet TAKE 1 TABLET BY  MOUTH ON MONDAY,WEDNESDAY AND FRIDAY. 12 tablet 3  . simvastatin (ZOCOR) 20 MG tablet TAKE (1) TABLET BY MOUTH AT BEDTIME FOR CHOLESTEROL. 30 tablet 3  . valsartan (DIOVAN) 160 MG tablet Take 1 tablet (160 mg total) by mouth daily. 30 tablet 6  . warfarin (COUMADIN) 1 MG tablet TAKE 1 TABLET BY MOUTH ONCE A DAY,ALONG WITH 2.5 MG DAILY TO EQUAL 3.5 MG. 30 tablet 3  . warfarin (COUMADIN) 2.5 MG tablet TAKE 1 TABLET BY MOUTH ONCE A DAY,ALONG WITH 1 MG TO EQUAL 3.5 MG DAILY. 30 tablet 3  . Wheat Dextrin (BENEFIBER PO) Take 1 each by mouth daily.     No current facility-administered medications for this visit.    Discontinued Meds:    Medications Discontinued During This Encounter  Medication Reason  . allopurinol (ZYLOPRIM) 100 MG tablet Error  . valsartan (DIOVAN) 80 MG tablet Error    Patient Active Problem List   Diagnosis Date Noted  . Medicare annual wellness visit, subsequent 10/24/2013  . Need for vaccination with 13-polyvalent pneumococcal conjugate vaccine 10/24/2013  . Encounter for home safety review for injury prevention 06/23/2013  . Encounter for therapeutic drug monitoring 05/09/2013  . Hyperglycemia 03/07/2013  . Acute on chronic diastolic HF (heart failure)  03/06/2013  . Gout attack 03/04/2013  . Lower extremity edema 03/04/2013  . Gout flare 03/04/2013  . Valvular heart disease   . Cyst of hand 11/20/2012  . At high risk for falls 10/09/2012  . Hearing loss 03/14/2012  . Long term (current) use of anticoagulants 03/06/2012  . NSTEMI, initial episode of care 01/05/2012  . CVA (cerebral infarction) 01/05/2012  . Moderate aortic stenosis 01/05/2012  . Moderate mitral stenosis 01/05/2012  . New onset a-fib 01/05/2012  . CKD (chronic kidney disease) stage 3, GFR 30-59 ml/min 01/05/2012  . Swelling of joint of right shoulder 01/05/2012  . Hemiplegia, unspecified, affecting dominant side 01/05/2012  . Ankle fracture, left 12/07/2011  . Chronic kidney disease  09/30/2011  . Hyperlipidemia   . Hypertension   . Diverticulosis   . Paroxysmal atrial fibrillation   . Osteoporosis 11/06/2009  . ARTHRITIS, KNEES, BILATERAL 11/20/2007  . ANEMIA-IRON DEFICIENCY 04/06/2007    LABS    Component Value Date/Time   NA 139 10/21/2013 1048   NA 137 04/25/2013 1207   NA 136 03/15/2013 1037   K 4.7 10/21/2013 1048   K 4.7 04/25/2013 1207   K 5.8* 03/15/2013 1037   CL 102 10/21/2013 1048   CL 99 04/25/2013 1207   CL 95* 03/15/2013 1037   CO2 30 10/21/2013 1048   CO2 29 04/25/2013 1207   CO2 30 03/15/2013 1037   GLUCOSE 76 10/21/2013 1048   GLUCOSE 75 04/25/2013 1207   GLUCOSE 88 03/15/2013 1037   BUN 26* 10/21/2013 1048   BUN 30* 04/25/2013 1207   BUN 40* 03/15/2013 1037   CREATININE 1.44* 10/21/2013 1048   CREATININE 1.29* 04/25/2013 1207   CREATININE 1.39* 03/15/2013 1037   CREATININE 1.34* 03/08/2013 0536   CREATININE 1.29* 03/07/2013 0540   CREATININE 1.34* 03/06/2013 0604   CALCIUM 10.7* 10/21/2013 1048   CALCIUM 11.0* 04/25/2013 1207   CALCIUM 11.1* 03/15/2013 1037   GFRNONAA 33* 03/08/2013 0536   GFRNONAA 35* 03/07/2013 0540   GFRNONAA 33* 03/06/2013 0604   GFRAA 38* 03/08/2013 0536   GFRAA 40* 03/07/2013 0540   GFRAA 38* 03/06/2013 0604   CMP     Component Value Date/Time   NA 139 10/21/2013 1048   K 4.7 10/21/2013 1048   CL 102 10/21/2013 1048   CO2 30 10/21/2013 1048   GLUCOSE 76 10/21/2013 1048   BUN 26* 10/21/2013 1048   CREATININE 1.44* 10/21/2013 1048   CREATININE 1.34* 03/08/2013 0536   CALCIUM 10.7* 10/21/2013 1048   PROT 7.0 10/21/2013 1048   ALBUMIN 3.9 10/21/2013 1048   AST 18 10/21/2013 1048   ALT 11 10/21/2013 1048   ALKPHOS 42 10/21/2013 1048   BILITOT 1.1 10/21/2013 1048   GFRNONAA 33* 03/08/2013 0536   GFRAA 38* 03/08/2013 0536       Component Value Date/Time   WBC 4.0 10/21/2013 1048   WBC 7.6 03/07/2013 0540   WBC 6.6 03/06/2013 0604   HGB 10.8* 10/21/2013 1048   HGB 10.7* 03/07/2013 0540    HGB 10.0* 03/06/2013 0604   HCT 30.4* 10/21/2013 1048   HCT 33.7* 03/07/2013 0540   HCT 31.3* 03/06/2013 0604   MCV 92.4 10/21/2013 1048   MCV 96.6 03/07/2013 0540   MCV 96.3 03/06/2013 0604    Lipid Panel     Component Value Date/Time   CHOL 128 10/21/2013 1048   TRIG 55 10/21/2013 1048   HDL 67 10/21/2013 1048   CHOLHDL 1.9 10/21/2013 1048   VLDL 11  10/21/2013 1048   LDLCALC 50 10/21/2013 1048    ABG No results found for: PHART, PCO2ART, PO2ART, HCO3, TCO2, ACIDBASEDEF, O2SAT   Lab Results  Component Value Date   TSH 0.838 10/21/2013   BNP (last 3 results)  Recent Labs  03/04/13 1243  PROBNP 4971.0*   Cardiac Panel (last 3 results) No results for input(s): CKTOTAL, CKMB, TROPONINI, RELINDX in the last 72 hours.  Iron/TIBC/Ferritin/ %Sat    Component Value Date/Time   IRON 116 11/09/2009 0922     EKG Orders placed or performed during the hospital encounter of 03/04/13  . EKG 12-Lead  . EKG 12-Lead  . EKG     Prior Assessment and Plan Problem List as of 01/30/2014      Cardiovascular and Mediastinum   Hypertension   Last Assessment & Plan   06/20/2013 Office Visit Written 06/23/2013  4:20 PM by Kerri Perches, MD    Controlled, no change in medication     Paroxysmal atrial fibrillation   NSTEMI, initial episode of care   Moderate aortic stenosis   Last Assessment & Plan   02/06/2013 Office Visit Written 02/06/2013  2:24 PM by Jodelle Gross, NP    Significant systolic murmur but she is asymptomatic and very sedentary. Wheel chair bound due to arthritic knees.    Moderate mitral stenosis   New onset a-fib   Last Assessment & Plan   07/06/2012 Office Visit Written 07/06/2012  1:22 PM by Jodelle Gross, NP    This is become chronic for her. Heart rate is well-controlled currently on metoprolol 50 mg one half tablets twice a day. She will continue in our [place] office Coumadin clinic. No changes to current medication regimen.    Valvular  heart disease   Acute on chronic diastolic HF (heart failure)     Digestive   Diverticulosis   Last Assessment & Plan   04/25/2013 Office Visit Written 04/28/2013  4:20 PM by Kerri Perches, MD    No recent GI bleed, close monitoring of coumadin through the clinic      Nervous and Auditory   CVA (cerebral infarction)   Last Assessment & Plan   10/09/2012 Office Visit Written 10/14/2012  5:31 PM by Kerri Perches, MD    Residual hemiparesis, needs increased CAP hours    Hemiplegia, unspecified, affecting dominant side   Last Assessment & Plan   01/31/2013 Office Visit Written 02/01/2013  9:20 AM by Kerri Perches, MD    Unchanged, follwoig CVA    Hearing loss   Last Assessment & Plan   03/14/2012 Office Visit Written 04/08/2012  2:42 PM by Kerri Perches, MD    C/o deterioation in hearing and requests referral for eval and management      Musculoskeletal and Integument   ARTHRITIS, KNEES, BILATERAL   Last Assessment & Plan   04/25/2013 Office Visit Written 04/28/2013  4:21 PM by Kerri Perches, MD    Severe osteoarthritis of both knees, managed by ortho, on hydrocodone     Osteoporosis   Last Assessment & Plan   04/25/2013 Office Visit Written 04/28/2013  4:22 PM by Kerri Perches, MD    Continue fosamax and calcium    Ankle fracture, left   Last Assessment & Plan   12/07/2011 Office Visit Written 12/07/2011  7:51 PM by Kerri Perches, MD    Recent trauma to ankle referred to orthopedics    Swelling of joint of right shoulder  Cyst of hand   Last Assessment & Plan   11/20/2012 Office Visit Written 11/23/2012  9:00 AM by Kerri Perches, MD    Refer tpo ortho for eval, no intervention anticipated as being necessary, second opinion only, since reported as new, and there is surrounding bruise      Genitourinary   Chronic kidney disease   CKD (chronic kidney disease) stage 3, GFR 30-59 ml/min   Last Assessment & Plan   01/31/2013 Office Visit Written  02/01/2013  9:18 AM by Kerri Perches, MD    Pt to avoid nSAID      Other   [facility] DEFICIENCY   Last Assessment & Plan   09/05/2011 Office Visit Written 09/05/2011 12:08 PM by Kathlen Brunswick, MD    Patient has had a long-standing mild to moderate anemia, the cause of which is undiagnosed as far as I know.  CBC will be repeated.    Hyperlipidemia   Last Assessment & Plan   06/20/2013 Office Visit Written 06/23/2013  4:20 PM by Kerri Perches, MD    Controlled, no change in medication     Long term (current) use of anticoagulants   Last Assessment & Plan   06/20/2013 Office Visit Written 06/23/2013  4:22 PM by Kerri Perches, MD    deenies visible bleeding from rectum, nose or bladder. Followed by coumadin clinic, on med due to a fib    At high risk for falls   Last Assessment & Plan   06/20/2013 Office Visit Written 06/23/2013  4:21 PM by Kerri Perches, MD    No falls, despite high risk. States current w/chair is over 54 y/o , needs a new one, will refer to OT for evaluation for documentation of necessity     Gout attack   Last Assessment & Plan   03/15/2013 Office Visit Edited 03/15/2013 10:41 AM by Kerri Perches, MD    Marked symptom improvement , hydrocodone to be prescribed by ortho, d/c tramadol. Now on allopurinol also Chem 7 stat today    Lower extremity edema   Last Assessment & Plan   06/20/2013 Office Visit Written 06/23/2013  4:22 PM by Kerri Perches, MD    Resolved, doing well    Gout flare   Hyperglycemia   Encounter for therapeutic drug monitoring   Encounter for home safety review for injury prevention   Last Assessment & Plan   06/20/2013 Office Visit Written 06/23/2013  4:28 PM by Kerri Perches, MD    Reduction in fall risk and safety in the home discussed , and literature  provided on the discharge instruction sheet as well.     Medicare annual wellness visit, subsequent   Last Assessment & Plan   10/24/2013 Office Visit  Written 10/24/2013 11:39 AM by Kerri Perches, MD    Annual exam as documented. Counseling done  re healthy lifestyle involving commitment to 150 minutes exercise per week, heart healthy diet, and attaining healthy weight.The importance of adequate sleep also discussed. Regular seat belt use and sfe storage  of firearmas if patient has them, is also discussed. Changes in health habits are decided on by the patient with goals and time frames  set for achieving them. Immunization and cancer screening needs are specifically addressed at this visit.     Need for vaccination with 13-polyvalent pneumococcal conjugate vaccine   Last Assessment & Plan   10/24/2013 Office Visit Written 10/24/2013 11:41 AM by Kerri Perches, MD  Vaccine administered        Imaging: No results found.

## 2014-01-30 NOTE — Assessment & Plan Note (Signed)
Good control

## 2014-01-30 NOTE — Assessment & Plan Note (Signed)
Not a surgical candidate. ?

## 2014-01-30 NOTE — Assessment & Plan Note (Signed)
Labs pending from Dr. Lodema HongSimpson.

## 2014-01-30 NOTE — Addendum Note (Signed)
Addended by: Tenna DelaineBARKER, Gabrien Mentink T on: 01/30/2014 03:12 PM   Modules accepted: Orders

## 2014-02-03 ENCOUNTER — Inpatient Hospital Stay (HOSPITAL_COMMUNITY)
Admission: EM | Admit: 2014-02-03 | Discharge: 2014-02-05 | DRG: 309 | Disposition: A | Discharge status: Home Health | Payer: PRIVATE HEALTH INSURANCE | Attending: Internal Medicine | Admitting: Internal Medicine

## 2014-02-03 ENCOUNTER — Emergency Department (HOSPITAL_COMMUNITY): Payer: PRIVATE HEALTH INSURANCE

## 2014-02-03 ENCOUNTER — Encounter (HOSPITAL_COMMUNITY): Payer: Self-pay | Admitting: Cardiology

## 2014-02-03 DIAGNOSIS — M170 Bilateral primary osteoarthritis of knee: Secondary | ICD-10-CM | POA: Diagnosis present

## 2014-02-03 DIAGNOSIS — I69351 Hemiplegia and hemiparesis following cerebral infarction affecting right dominant side: Secondary | ICD-10-CM | POA: Diagnosis not present

## 2014-02-03 DIAGNOSIS — N390 Urinary tract infection, site not specified: Secondary | ICD-10-CM | POA: Diagnosis present

## 2014-02-03 DIAGNOSIS — E785 Hyperlipidemia, unspecified: Secondary | ICD-10-CM | POA: Diagnosis present

## 2014-02-03 DIAGNOSIS — I482 Chronic atrial fibrillation: Principal | ICD-10-CM | POA: Diagnosis present

## 2014-02-03 DIAGNOSIS — Z79899 Other long term (current) drug therapy: Secondary | ICD-10-CM | POA: Diagnosis not present

## 2014-02-03 DIAGNOSIS — Z7901 Long term (current) use of anticoagulants: Secondary | ICD-10-CM

## 2014-02-03 DIAGNOSIS — Z66 Do not resuscitate: Secondary | ICD-10-CM | POA: Diagnosis present

## 2014-02-03 DIAGNOSIS — R5381 Other malaise: Secondary | ICD-10-CM | POA: Diagnosis present

## 2014-02-03 DIAGNOSIS — N189 Chronic kidney disease, unspecified: Secondary | ICD-10-CM | POA: Diagnosis present

## 2014-02-03 DIAGNOSIS — Z833 Family history of diabetes mellitus: Secondary | ICD-10-CM | POA: Diagnosis not present

## 2014-02-03 DIAGNOSIS — I4891 Unspecified atrial fibrillation: Secondary | ICD-10-CM | POA: Diagnosis present

## 2014-02-03 DIAGNOSIS — M810 Age-related osteoporosis without current pathological fracture: Secondary | ICD-10-CM | POA: Diagnosis present

## 2014-02-03 DIAGNOSIS — J029 Acute pharyngitis, unspecified: Secondary | ICD-10-CM | POA: Diagnosis present

## 2014-02-03 DIAGNOSIS — J040 Acute laryngitis: Secondary | ICD-10-CM | POA: Diagnosis present

## 2014-02-03 DIAGNOSIS — R531 Weakness: Secondary | ICD-10-CM | POA: Diagnosis present

## 2014-02-03 DIAGNOSIS — Z96649 Presence of unspecified artificial hip joint: Secondary | ICD-10-CM | POA: Diagnosis present

## 2014-02-03 DIAGNOSIS — Z993 Dependence on wheelchair: Secondary | ICD-10-CM | POA: Diagnosis not present

## 2014-02-03 DIAGNOSIS — N183 Chronic kidney disease, stage 3 (moderate): Secondary | ICD-10-CM | POA: Diagnosis present

## 2014-02-03 DIAGNOSIS — I5042 Chronic combined systolic (congestive) and diastolic (congestive) heart failure: Secondary | ICD-10-CM | POA: Diagnosis present

## 2014-02-03 DIAGNOSIS — Z87891 Personal history of nicotine dependence: Secondary | ICD-10-CM | POA: Diagnosis not present

## 2014-02-03 DIAGNOSIS — Z7982 Long term (current) use of aspirin: Secondary | ICD-10-CM

## 2014-02-03 DIAGNOSIS — I129 Hypertensive chronic kidney disease with stage 1 through stage 4 chronic kidney disease, or unspecified chronic kidney disease: Secondary | ICD-10-CM | POA: Diagnosis present

## 2014-02-03 DIAGNOSIS — I10 Essential (primary) hypertension: Secondary | ICD-10-CM | POA: Diagnosis present

## 2014-02-03 DIAGNOSIS — G8190 Hemiplegia, unspecified affecting unspecified side: Secondary | ICD-10-CM

## 2014-02-03 DIAGNOSIS — R32 Unspecified urinary incontinence: Secondary | ICD-10-CM | POA: Diagnosis present

## 2014-02-03 DIAGNOSIS — D509 Iron deficiency anemia, unspecified: Secondary | ICD-10-CM | POA: Diagnosis present

## 2014-02-03 DIAGNOSIS — R0602 Shortness of breath: Secondary | ICD-10-CM

## 2014-02-03 LAB — URINALYSIS, ROUTINE W REFLEX MICROSCOPIC
Glucose, UA: NEGATIVE mg/dL
NITRITE: NEGATIVE
PROTEIN: 30 mg/dL — AB
UROBILINOGEN UA: 0.2 mg/dL (ref 0.0–1.0)
pH: 5 (ref 5.0–8.0)

## 2014-02-03 LAB — COMPREHENSIVE METABOLIC PANEL
ALBUMIN: 3.8 g/dL (ref 3.5–5.2)
ALT: 16 U/L (ref 0–35)
ANION GAP: 20 — AB (ref 5–15)
AST: 44 U/L — ABNORMAL HIGH (ref 0–37)
Alkaline Phosphatase: 60 U/L (ref 39–117)
BUN: 29 mg/dL — ABNORMAL HIGH (ref 6–23)
CO2: 22 mEq/L (ref 19–32)
CREATININE: 1.38 mg/dL — AB (ref 0.50–1.10)
Calcium: 10.3 mg/dL (ref 8.4–10.5)
Chloride: 93 mEq/L — ABNORMAL LOW (ref 96–112)
GFR calc Af Amer: 37 mL/min — ABNORMAL LOW (ref >90)
GFR calc non Af Amer: 32 mL/min — ABNORMAL LOW (ref >90)
Glucose, Bld: 77 mg/dL (ref 70–99)
Potassium: 4.5 mEq/L (ref 3.7–5.3)
Sodium: 135 mEq/L — ABNORMAL LOW (ref 137–147)
Total Bilirubin: 1.9 mg/dL — ABNORMAL HIGH (ref 0.3–1.2)
Total Protein: 8.1 g/dL (ref 6.0–8.3)

## 2014-02-03 LAB — CBC WITH DIFFERENTIAL/PLATELET
BASOS ABS: 0 10*3/uL (ref 0.0–0.1)
BASOS PCT: 0 % (ref 0–1)
EOS PCT: 0 % (ref 0–5)
Eosinophils Absolute: 0 10*3/uL (ref 0.0–0.7)
HEMATOCRIT: 39.1 % (ref 36.0–46.0)
Hemoglobin: 13.7 g/dL (ref 12.0–15.0)
Lymphocytes Relative: 16 % (ref 12–46)
Lymphs Abs: 1 10*3/uL (ref 0.7–4.0)
MCH: 34.3 pg — ABNORMAL HIGH (ref 26.0–34.0)
MCHC: 35 g/dL (ref 30.0–36.0)
MCV: 98 fL (ref 78.0–100.0)
MONO ABS: 0.5 10*3/uL (ref 0.1–1.0)
Monocytes Relative: 7 % (ref 3–12)
Neutro Abs: 4.7 10*3/uL (ref 1.7–7.7)
Neutrophils Relative %: 76 % (ref 43–77)
Platelets: 140 10*3/uL — ABNORMAL LOW (ref 150–400)
RBC: 3.99 MIL/uL (ref 3.87–5.11)
RDW: 13.6 % (ref 11.5–15.5)
WBC: 6.1 10*3/uL (ref 4.0–10.5)

## 2014-02-03 LAB — MRSA PCR SCREENING: MRSA by PCR: NEGATIVE

## 2014-02-03 LAB — LACTIC ACID, PLASMA: Lactic Acid, Venous: 2.4 mmol/L — ABNORMAL HIGH (ref 0.5–2.2)

## 2014-02-03 LAB — URINE MICROSCOPIC-ADD ON

## 2014-02-03 LAB — TROPONIN I: Troponin I: <0.3 ng/mL (ref <0.30)

## 2014-02-03 LAB — PROTIME-INR
INR: 1.5 — AB (ref 0.00–1.49)
PROTHROMBIN TIME: 18.3 s — AB (ref 11.6–15.2)

## 2014-02-03 LAB — RAPID STREP SCREEN (MED CTR MEBANE ONLY): STREPTOCOCCUS, GROUP A SCREEN (DIRECT): NEGATIVE

## 2014-02-03 MED ORDER — POLYETHYLENE GLYCOL 3350 17 G PO PACK
17.0000 g | PACK | Freq: Every day | ORAL | Status: DC | PRN
Start: 2014-02-03 — End: 2014-02-05

## 2014-02-03 MED ORDER — ONDANSETRON HCL 4 MG/2ML IJ SOLN: 4.0000 mg | Freq: Four times a day (QID) | INTRAMUSCULAR | Status: DC | PRN

## 2014-02-03 MED ORDER — ACETAMINOPHEN 325 MG PO TABS
650.0000 mg | ORAL_TABLET | Freq: Four times a day (QID) | ORAL | Status: DC | PRN
Start: 2014-02-03 — End: 2014-02-05

## 2014-02-03 MED ORDER — METOPROLOL TARTRATE 50 MG PO TABS: 50.0000 mg | ORAL_TABLET | Freq: Two times a day (BID) | ORAL | Status: DC

## 2014-02-03 MED ORDER — TRAZODONE HCL 50 MG PO TABS: 25.0000 mg | ORAL_TABLET | Freq: Every evening | ORAL | Status: DC | PRN

## 2014-02-03 MED ORDER — DOCUSATE SODIUM 100 MG PO CAPS: 100.0000 mg | ORAL_CAPSULE | Freq: Two times a day (BID) | ORAL | Status: DC | PRN

## 2014-02-03 MED ORDER — ACETAMINOPHEN 650 MG RE SUPP: 650.0000 mg | Freq: Four times a day (QID) | RECTAL | Status: DC | PRN

## 2014-02-03 MED ORDER — ASPIRIN EC 81 MG PO TBEC
81.0000 mg | DELAYED_RELEASE_TABLET | Freq: Every day | ORAL | Status: DC
  Administered 2014-02-03 – 2014-02-05 (×3): 81 mg via ORAL
  Filled 2014-02-03 (×3): qty 1

## 2014-02-03 MED ORDER — BISACODYL 10 MG RE SUPP: 10.0000 mg | Freq: Every day | RECTAL | Status: DC | PRN

## 2014-02-03 MED ORDER — DILTIAZEM LOAD VIA INFUSION
10.0000 mg | Freq: Once | INTRAVENOUS | Status: AC
  Administered 2014-02-03: 10 mg via INTRAVENOUS
  Filled 2014-02-03: qty 10

## 2014-02-03 MED ORDER — SODIUM CHLORIDE 0.9 % IV BOLUS (SEPSIS)
500.0000 mL | Freq: Once | INTRAVENOUS | Status: AC
  Administered 2014-02-03: 500 mL via INTRAVENOUS

## 2014-02-03 MED ORDER — DEXTROSE 5 % IV SOLN
5.0000 mg/h | INTRAVENOUS | Status: DC
Start: 2014-02-03 — End: 2014-02-03
  Administered 2014-02-03: 5 mg/h via INTRAVENOUS
  Filled 2014-02-03: qty 100

## 2014-02-03 MED ORDER — WARFARIN SODIUM 5 MG PO TABS
5.0000 mg | ORAL_TABLET | Freq: Once | ORAL | Status: AC
  Administered 2014-02-03: 5 mg via ORAL
  Filled 2014-02-03: qty 1

## 2014-02-03 MED ORDER — ALUM & MAG HYDROXIDE-SIMETH 200-200-20 MG/5ML PO SUSP: 30.0000 mL | Freq: Four times a day (QID) | ORAL | Status: DC | PRN

## 2014-02-03 MED ORDER — ONDANSETRON HCL 4 MG PO TABS: 4.0000 mg | ORAL_TABLET | Freq: Four times a day (QID) | ORAL | Status: DC | PRN

## 2014-02-03 MED ORDER — WARFARIN - PHARMACIST DOSING INPATIENT: Status: DC

## 2014-02-03 MED ORDER — HYDROCODONE-ACETAMINOPHEN 5-325 MG PO TABS: 1.0000 | ORAL_TABLET | ORAL | Status: DC | PRN

## 2014-02-03 MED ORDER — SIMVASTATIN 20 MG PO TABS: 20.0000 mg | ORAL_TABLET | Freq: Every day | ORAL | Status: DC

## 2014-02-03 MED ORDER — ATORVASTATIN CALCIUM 10 MG PO TABS
10.0000 mg | ORAL_TABLET | Freq: Every day | ORAL | Status: DC
  Administered 2014-02-03 – 2014-02-04 (×2): 10 mg via ORAL
  Filled 2014-02-03 (×2): qty 1

## 2014-02-03 MED ORDER — ENOXAPARIN SODIUM 30 MG/0.3ML ~~LOC~~ SOLN
30.0000 mg | SUBCUTANEOUS | Status: DC
  Administered 2014-02-03 – 2014-02-04 (×2): 30 mg via SUBCUTANEOUS
  Filled 2014-02-03 (×2): qty 0.3

## 2014-02-03 MED ORDER — SODIUM CHLORIDE 0.9 % IV SOLN
INTRAVENOUS | Status: AC
  Administered 2014-02-03: 22:00:00 via INTRAVENOUS

## 2014-02-03 MED ORDER — FLUTICASONE PROPIONATE 50 MCG/ACT NA SUSP
1.0000 | Freq: Every day | NASAL | Status: DC
  Administered 2014-02-03 – 2014-02-05 (×3): 1 via NASAL
  Filled 2014-02-03 (×2): qty 16

## 2014-02-03 MED ORDER — DEXTROSE 5 % IV SOLN
1.0000 g | INTRAVENOUS | Status: DC
  Administered 2014-02-03 – 2014-02-05 (×3): 1 g via INTRAVENOUS
  Filled 2014-02-03 (×3): qty 10

## 2014-02-03 MED ORDER — METOPROLOL TARTRATE 25 MG PO TABS
75.0000 mg | ORAL_TABLET | Freq: Every morning | ORAL | Status: DC
  Administered 2014-02-04 – 2014-02-05 (×2): 75 mg via ORAL
  Filled 2014-02-03 (×4): qty 1

## 2014-02-03 MED ORDER — DOCUSATE SODIUM 50 MG PO CAPS: 50.0000 mg | ORAL_CAPSULE | Freq: Two times a day (BID) | ORAL | Status: DC | PRN

## 2014-02-03 MED ORDER — ALLOPURINOL 100 MG PO TABS
100.0000 mg | ORAL_TABLET | Freq: Every day | ORAL | Status: DC
  Administered 2014-02-03 – 2014-02-05 (×3): 100 mg via ORAL
  Filled 2014-02-03 (×3): qty 1

## 2014-02-03 MED ORDER — HYDROCODONE-ACETAMINOPHEN 5-325 MG PO TABS: 1.0000 | ORAL_TABLET | Freq: Four times a day (QID) | ORAL | Status: DC | PRN

## 2014-02-03 MED ORDER — BENZONATATE 100 MG PO CAPS
100.0000 mg | ORAL_CAPSULE | Freq: Three times a day (TID) | ORAL | Status: DC | PRN
  Administered 2014-02-03 – 2014-02-05 (×4): 100 mg via ORAL
  Filled 2014-02-03 (×4): qty 1

## 2014-02-03 MED ORDER — ALLOPURINOL 100 MG PO TABS: 100.0000 mg | ORAL_TABLET | Freq: Every day | ORAL | Status: DC

## 2014-02-03 MED ORDER — SODIUM CHLORIDE 0.9 % IJ SOLN
3.0000 mL | Freq: Two times a day (BID) | INTRAMUSCULAR | Status: DC
  Administered 2014-02-03 – 2014-02-05 (×3): 3 mL via INTRAVENOUS

## 2014-02-03 MED ORDER — SODIUM CHLORIDE 0.9 % IV BOLUS (SEPSIS): 500.0000 mL | Freq: Once | INTRAVENOUS | Status: DC

## 2014-02-03 MED ORDER — DILTIAZEM HCL 100 MG IV SOLR: 5.0000 mg/h | INTRAVENOUS | Status: DC

## 2014-02-03 MED ORDER — METOPROLOL TARTRATE 50 MG PO TABS
50.0000 mg | ORAL_TABLET | Freq: Every day | ORAL | Status: DC
  Administered 2014-02-03: 50 mg via ORAL
  Filled 2014-02-03 (×2): qty 1

## 2014-02-03 NOTE — H&P (Signed)
[facility] History and Physical  Judy Grant Llamas ZOX:[id number] DOB: 1917-08-21 DOA: 02/03/2014  Referring physician:  PCP: Syliva OvermanMargaret Simpson, MD   Chief Complaint: weakness/sore throat  HPI: Judy Grant Hoshino is a 80 y.o. female with past medical history that includes A. Fib, hypertension, aortic stenosis, chronic kidney disease, anemia, stroke with right-sided weakness presents to the emergency department with chief complaint of generalized weakness. She reports 2 days ago beginning to feel gradually weak with decreased by mouth intake. Associated symptoms include sore throat with pain with swallowing. She's had decreased by mouth intake and intermittent nonproductive cough. She denies chest pain palpitations headache visual disturbances numbness tingling of extremities. She denies abdominal pain nausea vomiting diarrhea constipation melena. While in the emergency department she is found to be in A. Fib and RVR Cardizem drip was initiated  workup includes complete metabolic panel with a sodium of 135, chloride 93 creatinine 1.38, initial troponin negative, lactic acid 2.4. Complete blood counts with platelets of 140 otherwise unremarkable. INR 1.5 urinalysis inserting for UTI. chest x-ray is unremarkable EKG with A. fib  Review of Systems:  And point review of systems completed and all systems are negative except for history of present illness  Past Medical History  Diagnosis Date  . Hyperlipidemia   . Osteoporosis   . Hypertension     with severe left ventricular hypertrophy; normal ejection fraction in 04/2005  . Valvular heart disease     Mild to moderate aortic stenosis; moderate to severe mitral stenosis in 2007; mild MR  . Paroxysmal atrial fibrillation     Remote  . Diverticulosis     Pancolonic; h/o LGI bleeding and diverticulitis  . Degenerative joint disease     Knees  . Chronic kidney disease     Mild; creatinine of 1.19-1.28 in recent years  . Anemia, iron  deficiency   . Popliteal cyst     Left  . Fracture of wrist 2008    left  . Gout   . Stroke    Past Surgical History  Procedure Laterality Date  . Appendectomy  1944  . Total hip arthroplasty  1994   Social History:  reports that she quit smoking about 41 years ago. Her smoking use included Cigarettes. She smoked 0.00 packs per day. She has never used smokeless tobacco. She reports that she does not drink alcohol or use illicit drugs. 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 No Known Allergies  Family History  Problem Relation Age of Onset  . Diabetes Mother      Prior to Admission medications   Medication Sig Start Date End Date Taking? Authorizing Provider  alendronate (FOSAMAX) 70 MG tablet Take 70 mg by mouth once a week. Take with a full glass of water on an empty stomach.   Yes Historical Provider, MD  allopurinol (ZYLOPRIM) 100 MG tablet Take 100 mg by mouth daily.   Yes Historical Provider, MD  aspirin EC 81 MG tablet Take 81 mg by mouth daily.   Yes Historical Provider, MD  calcium-vitamin D (OSCAL WITH D) 500-200 MG-UNIT per tablet Take 1 tablet by mouth 3 (three) times daily.   Yes Historical Provider, MD  docusate sodium (COLACE) 50 MG capsule Take 50 mg by mouth 2 (two) times daily.   Yes Historical Provider, MD  furosemide (LASIX) 40 MG tablet Take 40 mg  by mouth daily.   Yes Historical Provider, MD  metoprolol (LOPRESSOR) 50 MG tablet Take 50-75 mg by mouth 2 (two) times daily. 75 mg each morning and 50 mg at bedtime.   Yes Historical Provider, MD  potassium chloride (K-DUR) 10 MEQ tablet Take 10 mEq by mouth daily. On Monday, Wednesday, and Friday   Yes Historical Provider, MD  simvastatin (ZOCOR) 20 MG tablet Take 20 mg by mouth at bedtime.   Yes Historical Provider, MD  valsartan (DIOVAN) 80 MG tablet Take 1 tablet by mouth daily. 01/28/14  Yes Historical  Provider, MD  warfarin (COUMADIN) 1 MG tablet Take 1 mg by mouth daily. Along with 2.5 mg daily to equal 3.5 mg.   Yes Historical Provider, MD  warfarin (COUMADIN) 2.5 MG tablet Take 2.5 mg by mouth daily. Along with 1 mg to equal 3.5 mg.   Yes Historical Provider, MD  alendronate (FOSAMAX) 70 MG tablet TAKE 1 TAB EACH WEEK 30 MIN PRIOR TO BREAKFAST WITH LARGE GLASS OF WATER. REMAIN UPRIGHT. 12/06/13   Kerri Perches, MD  allopurinol (ZYLOPRIM) 100 MG tablet TAKE ONE TABLET BY MOUTH ONCE DAILY. 10/28/13   Kerri Perches, MD  ASPIRIN LOW DOSE 81 MG EC tablet TAKE ONE TABLET BY MOUTH ONCE DAILY. 10/28/13   Kerri Perches, MD  Calcium Carb-Cholecalciferol 500-400 MG-UNIT TABS TAKE (1) TABLET BY MOUTH (3) TIMES DAILY. 07/24/13   Kerri Perches, MD  COLACE 50 MG capsule TAKE (1) CAPSULE BY MOUTH TWICE DAILY. 10/28/13   Kerri Perches, MD  fluticasone (FLONASE) 50 MCG/ACT nasal spray USE 1 SPRAY IN EACH NOSTRIL ONCE DAILY. 07/23/13   Kerri Perches, MD  furosemide (LASIX) 40 MG tablet TAKE ONE TABLET BY MOUTH ONCE DAILY. 12/24/13   Laqueta Linden, MD  HYDROcodone-acetaminophen (NORCO/VICODIN) 5-325 MG per tablet Take 1 tablet by mouth as needed.  01/21/14   Historical Provider, MD  metoprolol (LOPRESSOR) 50 MG tablet TAKE 1 & 1/2 TABLETS BY MOUTH EACH MORNING. 10/28/13   Kerri Perches, MD  polyethylene glycol powder (GLYCOLAX/MIRALAX) powder Take 17 g by mouth daily as needed (for constipation).    Historical Provider, MD  potassium chloride (K-DUR) 10 MEQ tablet TAKE 1 TABLET BY MOUTH ON MONDAY,WEDNESDAY AND FRIDAY. 10/28/13   Kerri Perches, MD  simvastatin (ZOCOR) 20 MG tablet TAKE (1) TABLET BY MOUTH AT BEDTIME FOR CHOLESTEROL. 10/28/13   Kerri Perches, MD  valsartan (DIOVAN) 160 MG tablet Take 1 tablet (160 mg total) by mouth daily. Patient not taking: Reported on 02/03/2014 02/26/13   Laqueta Linden, MD  warfarin (COUMADIN) 1 MG tablet TAKE 1 TABLET BY MOUTH ONCE A  DAY,ALONG WITH 2.5 MG DAILY TO EQUAL 3.5 MG. 10/28/13   Jodelle Gross, NP  warfarin (COUMADIN) 2.5 MG tablet TAKE 1 TABLET BY MOUTH ONCE A DAY,ALONG WITH 1 MG TO EQUAL 3.5 MG DAILY. 12/23/13   Laqueta Linden, MD  Wheat Dextrin (BENEFIBER PO) Take 1 each by mouth daily.    Historical Provider, MD   Physical Exam: Filed Vitals:   02/03/14 1130 02/03/14 1325 02/03/14 1350 02/03/14 1400  BP: 103/66 132/90 144/87 127/93  Pulse: 115 130 103 107  Temp:      TempSrc:      Resp: 21 20 22 28   Height:      Weight:      SpO2:  95% 91%     Wt Readings from Last 3 Encounters:  02/03/14 63.504 kg (140  lb)  01/30/14 65.772 kg (145 lb)  10/24/13 68.04 kg (150 lb)    General:  Appears calm and comfortable Eyes: PERRL, normal lids, EOMI ENT: grossly normal hearing, because membranes of her mouth pink slightly dry Neck: no LAD, masses or thyromegaly Cardiovascular: irregularly irregular +murmur. No LE edema. Respiratory: CTA bilaterally, no w/r/r. Normal respiratory effort. Abdomen: soft, ntnd positive bowel sounds Skin: no rash or induration seen on limited exam Musculoskeletal: grossly normal tone BUE/BLE Psychiatric: grossly normal mood and affect, speech fluent and appropriate Neurologic: grossly non-focal. Right arm with decreased range of motion and weak grip          Labs on Admission:  Basic Metabolic Panel:  Recent Labs Lab 02/03/14 1147  NA 135*  K 4.5  CL 93*  CO2 22  GLUCOSE 77  BUN 29*  CREATININE 1.38*  CALCIUM 10.3   Liver Function Tests:  Recent Labs Lab 02/03/14 1147  AST 44*  ALT 16  ALKPHOS 60  BILITOT 1.9*  PROT 8.1  ALBUMIN 3.8   No results for input(s): LIPASE, AMYLASE in the last 168 hours. No results for input(s): AMMONIA in the last 168 hours. CBC:  Recent Labs Lab 02/03/14 1147  WBC 6.1  NEUTROABS 4.7  HGB 13.7  HCT 39.1  MCV 98.0  PLT 140*   Cardiac Enzymes:  Recent Labs Lab 02/03/14 1147  TROPONINI <0.30    BNP (last  3 results)  Recent Labs  03/04/13 1243  PROBNP 4971.0*   CBG: No results for input(s): GLUCAP in the last 168 hours.  Radiological Exams on Admission: Dg Chest Portable 1 View  02/03/2014   CLINICAL DATA:  Weakness and cough. The patient is unsure of time frame.  EXAM: PORTABLE CHEST - 1 VIEW  COMPARISON:  None.  FINDINGS: The heart size and mediastinal contours are stable. The aorta is tortuous. Heart size is enlarged. There is no focal infiltrate, pulmonary edema, or pleural effusion. The lungs are hyperinflated. The visualized skeletal structures are stable.  IMPRESSION: Emphysema.  No acute cardiopulmonary disease.   Electronically Signed   By: Sherian ReinWei-Chen  Lin M.D.   On: 02/03/2014 12:34    EKG: Independently reviewed. A. fib Assessment/Plan Principal Problem:   Atrial fibrillation with rapid ventricular response: with history of same. On Coumadin.home medications include Lopressor. Admit to step down will continue Cardizem drip and titrate according to heart rate and blood pressure. Transition to by mouth as indicated.review indicates last echo December 2014 it's time she was hospitalized for 4 days with diastolic heart failure and gout flare. Can't that time revealed an EF of 35% with grade 2 diastolic dysfunction. And repeating echo if no improvement Active Problems:   Iron deficiency anemia: appears stable at baseline monitor   Hypertension: controlled. Home meds include Lasix, Lopressor, Diovan. Will hold these for now. Will monitor and resume as indicated   Chronic kidney disease: stage 3. Appears stable at baseline. Gentle IV hydration recheck in the morning      Hemiplegia, unspecified, affecting dominant side: secondary to stroke. Will request PT   Code Status: DNR DVT Prophylaxis: Family Communication: sister at bedside Disposition Plan: home   Time spent: 1365 minutes  [facility] Medical CenterBLACK,Belia Febo M [facility] Pager [phone]

## 2014-02-03 NOTE — ED Provider Notes (Signed)
CSN: [id number]     Arrival date & time 02/03/14  1044 History  This chart was scribed for non-physician practitioner Burgess AmorJulie Edie Darley, PA-C working with Audree CamelScott T Goldston, MD by Littie Deedsichard Sun, ED Scribe. This patient was seen in room APA08/APA08 and the patient's care was started at 11:11 AM.    Chief Complaint  Patient presents with  . Weakness     The history is provided by the patient. No language interpreter was used.    HPI Comments: Judy Grant is a 80 y.o. wheelchair bound female with a hx of CVA, paroxysmal atrial fibrillation, valvular heart disease and chronic renal insufficiency who presents to the Emergency Department complaining of gradual onset, constant weakness that began within the past two days. Patient has been feeling sick the last few days and also reports having loss of appetite, difficulty swallowing, sore throat, left-sided otalgia, productive cough, SOB, and intermittent wheezing. She states the last time she remembers wheezing was last night. She has taken OTC cough syrup for her symptoms. Patient denies fever, chills, CP and abdominal pain. Patient currently lives with her son, and has a home health provider help her out at home. She denies smoking.  Past Medical History  Diagnosis Date  . Hyperlipidemia   . Osteoporosis   . Hypertension     with severe left ventricular hypertrophy; normal ejection fraction in 04/2005  . Valvular heart disease     Mild to moderate aortic stenosis; moderate to severe mitral stenosis in 2007; mild MR  . Paroxysmal atrial fibrillation     Remote  . Diverticulosis     Pancolonic; h/o LGI bleeding and diverticulitis  . Degenerative joint disease     Knees  . Chronic kidney disease     Mild; creatinine of 1.19-1.28 in recent years  . Anemia, iron deficiency   . Popliteal cyst     Left  . Fracture of wrist 2008    left  . Gout   . Stroke    Past Surgical History  Procedure Laterality Date  . Appendectomy  1944  . Total hip  arthroplasty  1994   Family History  Problem Relation Age of Onset  . Diabetes Mother    History  Substance Use Topics  . Smoking status: Former Smoker    Types: Cigarettes    Quit date: 08/31/1972  . Smokeless tobacco: Never Used  . Alcohol Use: No   OB History    No data available     Review of Systems  Constitutional: Positive for appetite change. Negative for fever and chills.  HENT: Positive for ear pain, sore throat and trouble swallowing. Negative for congestion.   Eyes: Negative.   Respiratory: Positive for cough, shortness of breath and wheezing. Negative for chest tightness.   Cardiovascular: Negative for chest pain.  Gastrointestinal: Negative for nausea and abdominal pain.  Genitourinary: Negative.   Musculoskeletal: Negative for joint swelling, arthralgias and neck pain.  Skin: Negative.  Negative for rash and wound.  Neurological: Positive for weakness. Negative for dizziness, light-headedness, numbness and headaches.  Psychiatric/Behavioral: Negative.       Allergies  Review of patient's allergies indicates no known allergies.  Home Medications   Prior to Admission medications   Medication Sig Start Date End Date Taking? Authorizing Provider  alendronate (FOSAMAX) 70 MG tablet TAKE 1 TAB EACH WEEK 30 MIN PRIOR TO BREAKFAST WITH LARGE GLASS OF WATER. REMAIN UPRIGHT. 12/06/13   Kerri PerchesMargaret E Simpson, MD  allopurinol (ZYLOPRIM) 100 MG tablet  TAKE ONE TABLET BY MOUTH ONCE DAILY. 10/28/13   Kerri Perches, MD  ASPIRIN LOW DOSE 81 MG EC tablet TAKE ONE TABLET BY MOUTH ONCE DAILY. 10/28/13   Kerri Perches, MD  Calcium Carb-Cholecalciferol 500-400 MG-UNIT TABS TAKE (1) TABLET BY MOUTH (3) TIMES DAILY. 07/24/13   Kerri Perches, MD  COLACE 50 MG capsule TAKE (1) CAPSULE BY MOUTH TWICE DAILY. 10/28/13   Kerri Perches, MD  fluticasone (FLONASE) 50 MCG/ACT nasal spray USE 1 SPRAY IN EACH NOSTRIL ONCE DAILY. 07/23/13   Kerri Perches, MD  furosemide (LASIX)  40 MG tablet TAKE ONE TABLET BY MOUTH ONCE DAILY. 12/24/13   Laqueta Linden, MD  HYDROcodone-acetaminophen (NORCO/VICODIN) 5-325 MG per tablet Take 1 tablet by mouth as needed.  01/21/14   Historical Provider, MD  metoprolol (LOPRESSOR) 50 MG tablet TAKE 1 & 1/2 TABLETS BY MOUTH EACH MORNING. 10/28/13   Kerri Perches, MD  polyethylene glycol powder (GLYCOLAX/MIRALAX) powder Take 17 g by mouth daily as needed (for constipation).    Historical Provider, MD  potassium chloride (K-DUR) 10 MEQ tablet TAKE 1 TABLET BY MOUTH ON MONDAY,WEDNESDAY AND FRIDAY. 10/28/13   Kerri Perches, MD  simvastatin (ZOCOR) 20 MG tablet TAKE (1) TABLET BY MOUTH AT BEDTIME FOR CHOLESTEROL. 10/28/13   Kerri Perches, MD  valsartan (DIOVAN) 160 MG tablet Take 1 tablet (160 mg total) by mouth daily. 02/26/13   Laqueta Linden, MD  warfarin (COUMADIN) 1 MG tablet TAKE 1 TABLET BY MOUTH ONCE A DAY,ALONG WITH 2.5 MG DAILY TO EQUAL 3.5 MG. 10/28/13   Jodelle Gross, NP  warfarin (COUMADIN) 2.5 MG tablet TAKE 1 TABLET BY MOUTH ONCE A DAY,ALONG WITH 1 MG TO EQUAL 3.5 MG DAILY. 12/23/13   Laqueta Linden, MD  Wheat Dextrin (BENEFIBER PO) Take 1 each by mouth daily.    Historical Provider, MD   BP 127/93 mmHg  Pulse 107  Temp(Src) 97.8 F (36.6 C) (Oral)  Resp 28  Ht 5\' 2"  (1.575 m)  Wt 140 lb (63.504 kg)  BMI 25.60 kg/m2  SpO2 91% Physical Exam  Constitutional: She appears well-developed and well-nourished.  HENT:  Head: Normocephalic and atraumatic.  Right Ear: Tympanic membrane and ear canal normal.  Left Ear: Tympanic membrane and ear canal normal.  Mouth is dry.   Eyes: Conjunctivae are normal.  Neck: Normal range of motion.  Cardiovascular: Regular rhythm, normal heart sounds and intact distal pulses.  Tachycardia present.   Pulmonary/Chest: Effort normal and breath sounds normal. No respiratory distress. She has no wheezes.  Not wheezing. She does have some rhonchi at her right base. No  respiratory distress.   Abdominal: Soft. Bowel sounds are normal. There is no tenderness.  Musculoskeletal: Normal range of motion. She exhibits no edema or tenderness.  Neurological: She is alert.  Skin: Skin is warm and dry.  Psychiatric: She has a normal mood and affect.  Nursing note and vitals reviewed.   ED Course  Procedures  DIAGNOSTIC STUDIES: Oxygen Saturation is 100% on room air, normal by my interpretation.    COORDINATION OF CARE: 11:18 AM-Discussed treatment plan which includes labs, CXR and IV with pt at bedside and pt agreed to plan.    Labs Review Labs Reviewed  CBC WITH DIFFERENTIAL - Abnormal; Notable for the following:    MCH 34.3 (*)    Platelets 140 (*)    All other components within normal limits  COMPREHENSIVE METABOLIC PANEL - Abnormal; Notable  for the following:    Sodium 135 (*)    Chloride 93 (*)    BUN 29 (*)    Creatinine, Ser 1.38 (*)    AST 44 (*)    Total Bilirubin 1.9 (*)    GFR calc non Af Amer 32 (*)    GFR calc Af Amer 37 (*)    Anion gap 20 (*)    All other components within normal limits  PROTIME-INR - Abnormal; Notable for the following:    Prothrombin Time 18.3 (*)    INR 1.50 (*)    All other components within normal limits  LACTIC ACID, PLASMA - Abnormal; Notable for the following:    Lactic Acid, Venous 2.4 (*)    All other components within normal limits  CULTURE, BLOOD (ROUTINE X 2)  CULTURE, BLOOD (ROUTINE X 2)  TROPONIN I  URINALYSIS, ROUTINE W REFLEX MICROSCOPIC    Imaging Review Dg Chest Portable 1 View  02/03/2014   CLINICAL DATA:  Weakness and cough. The patient is unsure of time frame.  EXAM: PORTABLE CHEST - 1 VIEW  COMPARISON:  None.  FINDINGS: The heart size and mediastinal contours are stable. The aorta is tortuous. Heart size is enlarged. There is no focal infiltrate, pulmonary edema, or pleural effusion. The lungs are hyperinflated. The visualized skeletal structures are stable.  IMPRESSION: Emphysema.  No  acute cardiopulmonary disease.   Electronically Signed   By: Sherian ReinWei-Chen  Lin M.D.   On: 02/03/2014 12:34     EKG Interpretation   Date/Time:  Monday February 03 2014 11:01:02 EST Ventricular Rate:  131 PR Interval:    QRS Duration: 65 QT Interval:  337 QTC Calculation: 497 R Axis:   68 Text Interpretation:  Atrial fibrillation with RVR Borderline prolonged QT  interval borderling T wave changes Confirmed by GOLDSTON  MD, SCOTT ([id number])  on 02/03/2014 11:45:18 AM      MDM   Final diagnoses:  SOB (shortness of breath)  Weakness    Pt was also seen by Dr Criss AlvineGoldston during this visit. Pt to be admitted.  I personally performed the services described in this documentation, which was scribed in my presence. The recorded information has been reviewed and is accurate.   Burgess AmorJulie Makaila Windle, PA-C 02/03/14 1439  Audree CamelScott T Goldston, MD 02/03/14 [phone]

## 2014-02-03 NOTE — Progress Notes (Addendum)
ANTICOAGULATION CONSULT NOTE - Initial Consult  Pharmacy Consult for Coumadin Indication: atrial fibrillation  No Known Allergies  Patient Measurements: Height: 5\' 2"  (157.5 cm) Weight: 140 lb (63.504 kg) IBW/kg (Calculated) : 50.1  Vital Signs: Temp: 97.8 F (36.6 C) (11/09 1100) Temp Source: Oral (11/09 1100) BP: 127/93 mmHg (11/09 1400) Pulse Rate: 107 (11/09 1400)  Labs:  Recent Labs  02/03/14 1147  HGB 13.7  HCT 39.1  PLT 140*  LABPROT 18.3*  INR 1.50*  CREATININE 1.38*  TROPONINI <0.30    Estimated Creatinine Clearance: 21.8 mL/min (by C-G formula based on Cr of 1.38).   Medical History: Past Medical History  Diagnosis Date  . Hyperlipidemia   . Osteoporosis   . Hypertension     with severe left ventricular hypertrophy; normal ejection fraction in 04/2005  . Valvular heart disease     Mild to moderate aortic stenosis; moderate to severe mitral stenosis in 2007; mild MR  . Paroxysmal atrial fibrillation     Remote  . Diverticulosis     Pancolonic; h/o LGI bleeding and diverticulitis  . Degenerative joint disease     Knees  . Chronic kidney disease     Mild; creatinine of 1.19-1.28 in recent years  . Anemia, iron deficiency   . Popliteal cyst     Left  . Fracture of wrist 2008    left  . Gout   . Stroke     Medications:  Coumadin 3.5mg  (2.5mg  + 1mg  tablet) daily except 5mg  (2.5mg  tablet x 2 tablets) on Mondays   Assessment: 89 yoF on chronic warfarin for Afib.  [facility].  INR is sub-therapeutic on admission.  It is unclear when patient actually took last dose PTA, however she has been on current regimen since June with therapeutic INRs at clinic visits.    No bleeding noted.  She is also starting Rocephin for possible UTI.  No renal dose adjustment necessary for this medication.    Goal of Therapy:  INR 2-3   Plan:  Coumadin 5mg  po x1 now Daily INR Lovenox 30mg  sq daily until INR>2 Rocephin 1gm IV  q24h  Elson ClanLilliston, [place] Kynard Michelle 02/03/2014,4:46 PM

## 2014-02-03 NOTE — ED Notes (Addendum)
Pt states she has been feeling sick the last couple of days.  States she feels weak. C/o left ear hurting and a cough.

## 2014-02-03 NOTE — ED Notes (Signed)
Attempted report x1- secretary told me that nurse would call me back.

## 2014-02-04 LAB — COMPREHENSIVE METABOLIC PANEL
ALK PHOS: 45 U/L (ref 39–117)
ALT: 13 U/L (ref 0–35)
AST: 36 U/L (ref 0–37)
Albumin: 3.1 g/dL — ABNORMAL LOW (ref 3.5–5.2)
Anion gap: 13 (ref 5–15)
BUN: 29 mg/dL — ABNORMAL HIGH (ref 6–23)
CO2: 24 mEq/L (ref 19–32)
Calcium: 8.9 mg/dL (ref 8.4–10.5)
Chloride: 100 mEq/L (ref 96–112)
Creatinine, Ser: 1.37 mg/dL — ABNORMAL HIGH (ref 0.50–1.10)
GFR calc non Af Amer: 32 mL/min — ABNORMAL LOW (ref >90)
GFR, EST AFRICAN AMERICAN: 37 mL/min — AB (ref >90)
Glucose, Bld: 97 mg/dL (ref 70–99)
POTASSIUM: 3.9 meq/L (ref 3.7–5.3)
SODIUM: 137 meq/L (ref 137–147)
TOTAL PROTEIN: 6.4 g/dL (ref 6.0–8.3)
Total Bilirubin: 1 mg/dL (ref 0.3–1.2)

## 2014-02-04 LAB — TSH: TSH: 1.42 u[IU]/mL (ref 0.350–4.500)

## 2014-02-04 LAB — CBC
HEMATOCRIT: 33.1 % — AB (ref 36.0–46.0)
HEMOGLOBIN: 10.8 g/dL — AB (ref 12.0–15.0)
MCH: 30.5 pg (ref 26.0–34.0)
MCHC: 32.6 g/dL (ref 30.0–36.0)
MCV: 93.5 fL (ref 78.0–100.0)
PLATELETS: 134 10*3/uL — AB (ref 150–400)
RBC: 3.54 MIL/uL — AB (ref 3.87–5.11)
RDW: 13.6 % (ref 11.5–15.5)
WBC: 5.6 10*3/uL (ref 4.0–10.5)

## 2014-02-04 LAB — PROTIME-INR
INR: 1.77 — AB (ref 0.00–1.49)
PROTHROMBIN TIME: 20.8 s — AB (ref 11.6–15.2)

## 2014-02-04 LAB — TROPONIN I: Troponin I: <0.3 ng/mL (ref <0.30)

## 2014-02-04 MED ORDER — DM-GUAIFENESIN ER 30-600 MG PO TB12
1.0000 | ORAL_TABLET | Freq: Two times a day (BID) | ORAL | Status: DC
  Administered 2014-02-04 – 2014-02-05 (×3): 1 via ORAL
  Filled 2014-02-04 (×3): qty 1

## 2014-02-04 MED ORDER — MENTHOL 3 MG MT LOZG
1.0000 | LOZENGE | OROMUCOSAL | Status: DC | PRN
  Administered 2014-02-05: 3 mg via ORAL
  Filled 2014-02-04: qty 9

## 2014-02-04 MED ORDER — ENSURE COMPLETE PO LIQD
237.0000 mL | Freq: Two times a day (BID) | ORAL | Status: DC
  Administered 2014-02-05 (×2): 237 mL via ORAL

## 2014-02-04 MED ORDER — WARFARIN SODIUM 5 MG PO TABS
5.0000 mg | ORAL_TABLET | Freq: Once | ORAL | Status: AC
  Administered 2014-02-04: 5 mg via ORAL
  Filled 2014-02-04: qty 1

## 2014-02-04 NOTE — Progress Notes (Signed)
Nutrition Brief Note  Patient identified on the Malnutrition Screening Tool (MST) Report  Wt Readings from Last 15 Encounters:  02/04/14 141 lb 8.6 oz (64.2 kg)  01/30/14 145 lb (65.772 kg)  10/24/13 150 lb (68.04 kg)  06/20/13 139 lb 1.9 oz (63.104 kg)  03/07/13 144 lb 14.4 oz (65.726 kg)  03/04/13 139 lb (63.05 kg)  02/26/13 140 lb (63.504 kg)  02/06/13 139 lb (63.05 kg)  01/31/13 139 lb 6.4 oz (63.231 kg)  11/20/12 139 lb 6.4 oz (63.231 kg)  10/17/12 130 lb (58.968 kg)  10/09/12 139 lb (63.05 kg)  07/06/12 136 lb (61.689 kg)  03/14/12 129 lb 1.9 oz (58.568 kg)  01/10/12 131 lb 6.3 oz (59.6 kg)    Body mass index is 25.88 kg/(m^2). Patient meets criteria for overweight based on current BMI. Pt reports usual body weight of 140#.  Current diet order is Heart healthy. Able to feed herself. Labs and medications reviewed.   No nutrition interventions warranted at this time. If nutrition issues arise, please consult RD.   Royann ShiversLynn Twanisha Foulk MS,RD,CSG,LDN Office: [phone] Pager: [phone]

## 2014-02-04 NOTE — Progress Notes (Signed)
[facility] PROGRESS NOTE  Judy Grant WUJ:[id number] DOB: 08-26-1917 DOA: 02/03/2014 PCP: Judy OvermanMargaret Simpson, MD  Assessment/Plan: Principal Problem:  Atrial fibrillation with rapid ventricular response: with history of same. Rate controlled and diltiazem drip discontinued. Continue home BB. Last echo December 2014 revealed  EF of 35% with grade 2 diastolic dysfunction. Transfer to telemetry. Will likely need OP f/u with cardiology Active Problems: UTI: await urine culture. Rocephin day #2. She has been incontinent of urine and output adequate.  She remains afebrile and non-toxic appearing.   Chronic kidney disease: stage 3. Remains stable at baseline. Taking po fluids well.    Iron deficiency anemia: likely related to chronic disease. Current Hg at baseline. No s/sx active bleeding. Monitor   Hypertension:  Fair control.  Home meds include Lasix, Lopressor, Diovan. Lopressor resumed. Continue to hold lasix and diovan.      Hemiplegia, unspecified, affecting dominant side: secondary to stroke. Await PT evaluation. Has motorized wheelchair at home.  Generalized weakness: likely related to 1 and 2. Await PT recommendations. Spoke to nephew at length about patient and her home situation. He reports she has motorized wheelchair and "gets around very well" and is able to care for disabled son. He reports she has HH assistance and daughter, sister and himself who assist.   Code Status: DNR Family Communication: nephew Horticulturist, [employer]kenny ([employer] employee) Disposition Plan: home hopefully in am   Consultants:  none  Procedures:  none  Antibiotics:  Rocephin 02/03/14>>  HPI/Subjective: Awake, reports throat less sore. Denies pain/discomfort  Objective: Filed Vitals:   02/04/14 0811  BP:   Pulse:   Temp: 98.2 F (36.8 C)  Resp:     Intake/Output Summary (Last 24 hours) at 02/04/14 0859 Last data filed at 02/04/14 0300  Gross per 24 hour  Intake 588.33 ml  Output     17  ml  Net 571.33 ml   Filed Weights   02/03/14 1100 02/04/14 0500  Weight: 63.504 kg (140 lb) 64.2 kg (141 lb 8.6 oz)    Exam:   General:  Somewhat frail appearing but better than yesterday, voice raspy  Cardiovascular: irregularly irregular, +murmur, no LE edema  Respiratory: normal effort BS clear bilaterally no wheeze or crackles  Abdomen: soft non-distended +BS non-tender  Musculoskeletal: no clubbing or cyanosis  Data Reviewed: Basic Metabolic Panel:  Recent Labs Lab 02/03/14 1147 02/04/14 0634  NA 135* 137  K 4.5 3.9  CL 93* 100  CO2 22 24  GLUCOSE 77 97  BUN 29* 29*  CREATININE 1.38* 1.37*  CALCIUM 10.3 8.9   Liver Function Tests:  Recent Labs Lab 02/03/14 1147 02/04/14 0634  AST 44* 36  ALT 16 13  ALKPHOS 60 45  BILITOT 1.9* 1.0  PROT 8.1 6.4  ALBUMIN 3.8 3.1*   No results for input(s): LIPASE, AMYLASE in the last 168 hours. No results for input(s): AMMONIA in the last 168 hours. CBC:  Recent Labs Lab 02/03/14 1147 02/04/14 0634  WBC 6.1 5.6  NEUTROABS 4.7  --   HGB 13.7 10.8*  HCT 39.1 33.1*  MCV 98.0 93.5  PLT 140* 134*   Cardiac Enzymes:  Recent Labs Lab 02/03/14 1147 02/03/14 1807 02/04/14 0634  TROPONINI <0.30 <0.30 <0.30   BNP (last 3 results)  Recent Labs  03/04/13 1243  PROBNP 4971.0*   CBG: No results for input(s): GLUCAP in the last 168 hours.  Recent Results (from the past 240 hour(s))  MRSA PCR Screening     Status: None  Collection Time: 02/03/14  4:01 PM  Result Value Ref Range Status   MRSA by PCR NEGATIVE NEGATIVE Final    Comment:        The GeneXpert MRSA Assay (FDA approved for NASAL specimens only), is one component of a comprehensive MRSA colonization surveillance program. It is not intended to diagnose MRSA infection nor to guide or monitor treatment for MRSA infections.   Rapid strep screen     Status: None   Collection Time: 02/03/14  5:15 PM  Result Value Ref Range Status    Streptococcus, Group A Screen (Direct) NEGATIVE NEGATIVE Final    Comment: (NOTE) A Rapid Antigen test may result negative if the antigen level in the sample is below the detection level of this test. The FDA has not cleared this test as a stand-alone test therefore the rapid antigen negative result has reflexed to a Group A Strep culture.      Studies: Dg Chest Portable 1 View  02/03/2014   CLINICAL DATA:  Weakness and cough. The patient is unsure of time frame.  EXAM: PORTABLE CHEST - 1 VIEW  COMPARISON:  None.  FINDINGS: The heart size and mediastinal contours are stable. The aorta is tortuous. Heart size is enlarged. There is no focal infiltrate, pulmonary edema, or pleural effusion. The lungs are hyperinflated. The visualized skeletal structures are stable.  IMPRESSION: Emphysema.  No acute cardiopulmonary disease.   Electronically Signed   By: Sherian ReinWei-Chen  Lin Grant.D.   On: 02/03/2014 12:34    Scheduled Meds: . allopurinol  100 mg Oral Daily  . aspirin EC  81 mg Oral Daily  . atorvastatin  10 mg Oral q1800  . cefTRIAXone (ROCEPHIN)  IV  1 g Intravenous Q24H  . dextromethorphan-guaiFENesin  1 tablet Oral BID  . enoxaparin (LOVENOX) injection  30 mg Subcutaneous Q24H  . fluticasone  1 spray Each Nare Daily  . metoprolol tartrate  50 mg Oral QHS   And  . metoprolol tartrate  75 mg Oral q morning - 10a  . sodium chloride  3 mL Intravenous Q12H  . warfarin  5 mg Oral Once  . Warfarin - Pharmacist Dosing Inpatient   Does not apply Q24H   Continuous Infusions:   Principal Problem:   Atrial fibrillation with rapid ventricular response Active Problems:   Iron deficiency anemia   Hypertension   Chronic kidney disease   New onset a-fib   Hemiplegia, unspecified, affecting dominant side   UTI (urinary tract infection)   Weakness generalized    Time spent: 35 minutes    Judy Grant,Judy Grant  [facility] Pager [phone]. If 7PM-7AM, please contact night-coverage at [email or web address],  password [facility] 02/04/2014, 8:59 AM  LOS: 1 day

## 2014-02-04 NOTE — Progress Notes (Signed)
ANTICOAGULATION CONSULT NOTE  Pharmacy Consult for Coumadin Indication: atrial fibrillation  No Known Allergies  Patient Measurements: Height: 5\' 2"  (157.5 cm) Weight: 141 lb 8.6 oz (64.2 kg) IBW/kg (Calculated) : 50.1  Vital Signs: Temp: 98.2 F (36.8 C) (11/10 0811) Temp Source: Oral (11/10 0811) BP: 139/74 mmHg (11/10 0700) Pulse Rate: 75 (11/10 0700)  Labs:  Recent Labs  02/03/14 1147 02/03/14 1807 02/04/14 0634  HGB 13.7  --  10.8*  HCT 39.1  --  33.1*  PLT 140*  --  134*  LABPROT 18.3*  --  20.8*  INR 1.50*  --  1.77*  CREATININE 1.38*  --  1.37*  TROPONINI <0.30 <0.30 <0.30    Estimated Creatinine Clearance: 22.1 mL/min (by C-G formula based on Cr of 1.37).   Medical History: Past Medical History  Diagnosis Date  . Hyperlipidemia   . Osteoporosis   . Hypertension     with severe left ventricular hypertrophy; normal ejection fraction in 04/2005  . Valvular heart disease     Mild to moderate aortic stenosis; moderate to severe mitral stenosis in 2007; mild MR  . Paroxysmal atrial fibrillation     Remote  . Diverticulosis     Pancolonic; h/o LGI bleeding and diverticulitis  . Degenerative joint disease     Knees  . Chronic kidney disease     Mild; creatinine of 1.19-1.28 in recent years  . Anemia, iron deficiency   . Popliteal cyst     Left  . Fracture of wrist 2008    left  . Gout   . Stroke     Medications:  Coumadin 3.5mg  (2.5mg  + 1mg  tablet) daily except 5mg  (2.5mg  tablet x 2 tablets) on Mondays   Assessment: 89 yoF on chronic warfarin for Afib.  [facility] regimen [facility] listed above.  INR is sub-therapeutic on admission, but rising to goal.  It is unclear when patient actually took last dose PTA, however she has been on current regimen since June with therapeutic INRs at clinic visits.    No bleeding noted.    Goal of Therapy:  INR 2-3   Plan:  Coumadin 5mg  po x1 now Daily INR Lovenox 30mg  sq daily until Deere & CompanyNR>2  Judy Grant  Judy Grant 02/04/2014,8:40 AM

## 2014-02-04 NOTE — Progress Notes (Signed)
PT TRANSFERRING TO  ROOM 325. PT ALERT AND ORIENTED. HR 70'S IN ATRIAL FIB. O2 SAT 97% ON ROOM AIR. IV SITES X 2 PATENT AND NSL. PT DENIES ANY DISCOMFORT, TRANSFER REPORT GIVEN TO LISA COVINGTON RN ON 300.

## 2014-02-05 DIAGNOSIS — N183 Chronic kidney disease, stage 3 (moderate): Secondary | ICD-10-CM

## 2014-02-05 DIAGNOSIS — N390 Urinary tract infection, site not specified: Secondary | ICD-10-CM

## 2014-02-05 LAB — BASIC METABOLIC PANEL
Anion gap: 14 (ref 5–15)
BUN: 19 mg/dL (ref 6–23)
CALCIUM: 9.2 mg/dL (ref 8.4–10.5)
CO2: 25 mEq/L (ref 19–32)
Chloride: 100 mEq/L (ref 96–112)
Creatinine, Ser: 1.18 mg/dL — ABNORMAL HIGH (ref 0.50–1.10)
GFR calc Af Amer: 44 mL/min — ABNORMAL LOW (ref >90)
GFR calc non Af Amer: 38 mL/min — ABNORMAL LOW (ref >90)
GLUCOSE: 91 mg/dL (ref 70–99)
POTASSIUM: 4 meq/L (ref 3.7–5.3)
Sodium: 139 mEq/L (ref 137–147)

## 2014-02-05 LAB — CULTURE, GROUP A STREP

## 2014-02-05 LAB — PROTIME-INR
INR: 2.14 — ABNORMAL HIGH (ref 0.00–1.49)
Prothrombin Time: 24.1 seconds — ABNORMAL HIGH (ref 11.6–15.2)

## 2014-02-05 MED ORDER — BENZONATATE 100 MG PO CAPS: 100.0000 mg | ORAL_CAPSULE | Freq: Three times a day (TID) | ORAL | Status: DC | PRN

## 2014-02-05 MED ORDER — WARFARIN SODIUM 5 MG PO TABS: 3.5000 mg | ORAL_TABLET | Freq: Once | ORAL | Status: DC

## 2014-02-05 MED ORDER — MENTHOL 3 MG MT LOZG: 1.0000 | LOZENGE | OROMUCOSAL | Status: DC | PRN

## 2014-02-05 MED ORDER — ENSURE COMPLETE PO LIQD: 237.0000 mL | Freq: Two times a day (BID) | ORAL | Status: DC

## 2014-02-05 MED ORDER — CEFUROXIME AXETIL 250 MG PO TABS: 250.0000 mg | ORAL_TABLET | Freq: Two times a day (BID) | ORAL | Status: DC

## 2014-02-05 NOTE — Progress Notes (Signed)
Patient and family state understanding of discharge instructions, prescriptions given. 

## 2014-02-05 NOTE — Evaluation (Signed)
Physical Therapy Evaluation Patient Details Name: Judy Grant MRN: [id number] DOB: 12-12-1917 Today's Date: 02/05/2014   History of Present Illness  Judy Grant is a 80 y.o. female with past medical history that includes A. Fib, hypertension, aortic stenosis, chronic kidney disease, anemia, stroke with right-sided weakness presents to the emergency department with chief complaint of generalized weakness. She reports 2 days ago beginning to feel gradually weak with decreased by mouth intake. Associated symptoms include sore throat with pain with swallowing. She's had decreased by mouth intake and intermittent nonproductive cough. She denies chest pain palpitations headache visual disturbances numbness tingling of extremities. She denies abdominal pain nausea vomiting diarrhea constipation melena.  Clinical Impression  Pt is a 80 year old female who presents to PT with dx of atrial fibrillation.  Pt reports she lives with her son, who has CP, and has an aide who assists 6 days a week.  She reports she is mod (I) with bed mobility skills and squat pivot transfer/scooting from bed -> electric W/C.  Pt reports she uses her electric W/C for all mobility skills, and reports she has not been ambulatory in a while.  During evaluation, pt was mod (I) with bed mobility skills and min assist for stand pivot transfers.  Unable to complete squat pivot transfer today, as arm rail of chair does not come off to scoot from bed -> chair.  Recommend continued PT with HHPT to address transfers with personal equipment (electric W/C and BSC) to ensure safety.  Pt will be d/c from acute PT services.  No DME recommendations.     Follow Up Recommendations Home health PT    Equipment Recommendations  None recommended by PT       Precautions / Restrictions Precautions Precautions: Fall Restrictions Weight Bearing Restrictions: No      Mobility  Bed Mobility Overal bed mobility: Modified Independent                 Transfers Overall transfer level: Needs assistance Equipment used: Rolling walker (2 wheeled) Transfers: Sit to/from [facility] Sit to Stand: Min guard;Min assist Stand pivot transfers: Min assist       General transfer comment: Unable to attempt squat transfer at this time secondary to arm rail on bed unable to be removed (electric W/C is able to be slide into without standing per pt)  Ambulation/Gait             General Gait Details: Pt reports she uses her electric W/C for mobility.      Balance Overall balance assessment: Needs assistance Sitting-balance support: Feet supported;No upper extremity supported Sitting balance-Leahy Scale: Good     Standing balance support: Bilateral upper extremity supported;During functional activity Standing balance-Leahy Scale: Fair                               Pertinent Vitals/Pain Pain Assessment: No/denies pain    Home Living Family/patient expects to be discharged to:: Private residence Living Arrangements: Children (Son is disabled (per pt, son has CP)) Available Help at Discharge: Family;Personal care attendant;Available 24 hours/day (Aide M-F 8-4, S 8-11) Type of Home: Mobile home Home Access: Ramped entrance (Ramped entry to back door)     Home Layout: One level Home Equipment: Tub bench;Wheelchair - power;Walker - 2 wheels Additional Comments: Risk analyst    Prior Function Level of Independence: Needs assistance   Gait / Transfers Assistance Needed: Per pt,  she is mod (I) with bed mobility skills and squat pivot transfers/scoot transfer to W/C or BSC  ADL's / Homemaking Assistance Needed: Pt has an aide which assist with cooking, cleaning, bathing, shopping        Hand Dominance   Dominant Hand: Right    Extremity/Trunk Assessment               Lower Extremity Assessment: Generalized weakness         Communication   Communication: No difficulties   Cognition Arousal/Alertness: Awake/alert Behavior During Therapy: WFL for tasks assessed/performed Overall Cognitive Status: Within Functional Limits for tasks assessed                       Assessment/Plan    PT Assessment All further PT needs can be met in the next venue of care  PT Diagnosis Generalized weakness   PT Problem List Decreased strength;Decreased balance;Decreased mobility  PT Treatment Interventions     PT Goals (Current goals can be found in the Care Plan section) Acute Rehab PT Goals PT Goal Formulation: All assessment and education complete, DC therapy    Frequency     Barriers to discharge        Co-evaluation               End of Session Equipment Utilized During Treatment: Gait belt Activity Tolerance: Patient tolerated treatment well Patient left: in chair;with call bell/phone within reach;with chair alarm set           Time: 0812-0840 PT Time Calculation (min) (ACUTE ONLY): 28 min   Charges:   PT Evaluation $Initial PT Evaluation Tier I: 1 Procedure     Constanza Mincy 02/05/2014, 9:01 AM

## 2014-02-05 NOTE — Plan of Care (Signed)
Problem: Phase I Progression Outcomes Goal: Pain controlled with appropriate interventions Outcome: Not Applicable Date Met:  23/30/07

## 2014-02-05 NOTE — Plan of Care (Signed)
Problem: Consults Goal: Skin Care Protocol Initiated - if Braden Score 18 or less If consults are not indicated, leave blank or document N/A  Outcome: Completed/Met Date Met:  02/05/14 Goal: Tobacco Cessation referral if indicated Outcome: Not Applicable Date Met:  61/51/83 Goal: Nutrition Consult-if indicated Outcome: Completed/Met Date Met:  02/05/14 Goal: Diabetes Guidelines if Diabetic/Glucose > 140 If diabetic or lab glucose is > 140 mg/dl - Initiate Diabetes/Hyperglycemia Guidelines & Document Interventions  Outcome: Not Applicable Date Met:  43/73/57

## 2014-02-05 NOTE — Progress Notes (Signed)
ANTICOAGULATION CONSULT NOTE  Pharmacy Consult for Coumadin Indication: atrial fibrillation  No Known Allergies  Patient Measurements: Height: 5\' 2"  (157.5 cm) Weight: 141 lb (63.957 kg) IBW/kg (Calculated) : 50.1  Vital Signs: Temp: 98.5 F (36.9 C) (11/11 0700) Temp Source: Oral (11/11 0700) BP: 110/76 mmHg (11/11 1004) Pulse Rate: 84 (11/11 1004)  Labs:  Recent Labs  02/03/14 1147 02/03/14 1807 02/04/14 0634 02/05/14 0618  HGB 13.7  --  10.8*  --   HCT 39.1  --  33.1*  --   PLT 140*  --  134*  --   LABPROT 18.3*  --  20.8* 24.1*  INR 1.50*  --  1.77* 2.14*  CREATININE 1.38*  --  1.37* 1.18*  TROPONINI <0.30 <0.30 <0.30  --     Estimated Creatinine Clearance: 25.6 mL/min (by C-G formula based on Cr of 1.18).   Medical History: Past Medical History  Diagnosis Date  . Hyperlipidemia   . Osteoporosis   . Hypertension     with severe left ventricular hypertrophy; normal ejection fraction in 04/2005  . Valvular heart disease     Mild to moderate aortic stenosis; moderate to severe mitral stenosis in 2007; mild MR  . Paroxysmal atrial fibrillation     Remote  . Diverticulosis     Pancolonic; h/o LGI bleeding and diverticulitis  . Degenerative joint disease     Knees  . Chronic kidney disease     Mild; creatinine of 1.19-1.28 in recent years  . Anemia, iron deficiency   . Popliteal cyst     Left  . Fracture of wrist 2008    left  . Gout   . Stroke     Medications:  Coumadin 3.5mg  (2.5mg  + 1mg  tablet) daily except 5mg  (2.5mg  tablet x 2 tablets) on Mondays   Assessment: 89 yoF on chronic warfarin for Afib.  [facility].  INR is sub-therapeutic on admission, but now at goal.  It is unclear when patient actually took last dose PTA, however she has been on current regimen since June with therapeutic INRs at clinic visits.    No bleeding noted.    Goal of Therapy:  INR 2-3   Plan:  Coumadin 3.5mg  po x1 Daily INR D/C  Lovenox  Lamonte RicherHayes, Darcus Edds R 02/05/2014,10:22 AM

## 2014-02-05 NOTE — Discharge Summary (Signed)
Physician Discharge Summary  Judy Grant MWU:[id number] DOB: [birth year] DOA: 02/03/2014  PCP: Judy Overman, MD  Admit date: 02/03/2014 Discharge date: 02/05/2014  Time spent: 40 minutes  Recommendations for Outpatient Follow-up:  1. Has follow up appointment with cardiology 02/14/14 follow up on afib/RVR.  2. PCP 1-2 weeks for evaluation of resolution of UTI. Monitor sore throat 3. HH PT and RN for strength and endurence as well as monitoring for any worsening of chronic issues  Discharge Diagnoses:  Principal Problem:   Atrial fibrillation with rapid ventricular response Active Problems:   Iron deficiency anemia   Hypertension   Chronic kidney disease   New onset a-fib   Hemiplegia, unspecified, affecting dominant side   UTI (urinary tract infection)   Weakness generalized   Discharge Condition: stable  Diet recommendation: heart healthy  Filed Weights   02/03/14 1100 02/04/14 0500 02/05/14 0500  Weight: 63.504 kg (140 lb) 64.2 kg (141 lb 8.6 oz) 63.957 kg (141 lb)    History of present illness:  Judy Grant is a 80 y.o. female with past medical history that includes A. Fib, hypertension, aortic stenosis, chronic kidney disease, anemia, stroke with right-sided weakness presented to the emergency department on 02/03/14 with chief complaint of generalized weakness. She reported 2 days prior she began to feel gradually weak with decreased by mouth intake. Associated symptoms included sore throat with pain with swallowing. She'd had decreased by mouth intake and intermittent nonproductive cough. She denied chest pain palpitations headache visual disturbances numbness tingling of extremities. She denied abdominal pain nausea vomiting diarrhea constipation melena. While in the emergency department she was found to be in A. Fib and RVR Cardizem drip was initiated workup included complete metabolic panel with a sodium of 135, chloride 93 creatinine 1.38, initial  troponin negative, lactic acid 2.4. Complete blood counts with platelets of 140 otherwise unremarkable. INR 1.5 urinalysis sonsistent for UTI. chest x-ray was unremarkable EKG with Judy Grant Course:  Atrial fibrillation with rapid ventricular response: with history of same. May be related to medication error at home and/or missed dose due to acute illness. Admitted to [facility] provided with cardizem gtt. Rate controlled and diltiazem drip discontinued. She will be discharged on home BB dose. Lengthy discussion regarding dosage and administration times.  Last echo December 2014 revealed EF of 35% with grade 2 diastolic dysfunction. She has follow up with cardiology 02/14/14.  Active Problems: UTI: urine culture pending at discharge. Received  Rocephin for 3 days. Will be discharge with Ceftin 250mg  for 4 days to complete 7 day course.  She remained afebrile and non-toxic appearing.   Sore throat: rapid strep negative. Much improved at discharge. Oropharynx exam benign.  Chronic kidney disease: stage 3. Remained stable at baseline. Taking po fluids well.    Iron deficiency anemia: likely related to chronic disease. Current Hg at baseline. No s/sx active bleeding.    Hypertension: controlled at discharge       Hemiplegia, unspecified, affecting dominant side: secondary to stroke. Evaluated by PT who recommends HH PT. Has motorized wheelchair at home and good family support  Generalized weakness: likely related to 1 and 2. PT recommends HH. Spoke to nephew at length about patient and her home situation. He reports she has motorized wheelchair and "gets around very well" and is able to care for disabled son. He reports she has HH assistance and daughter, sister and himself who assist.   Procedures:  none  Consultations:  none  Discharge  Exam: Filed Vitals:   02/05/14 1004  BP: 110/76  Pulse: 84  Temp:   Resp:     General: somewhat frail appearing but  comfortabl Cardiovascular: irregularly irregular +MGR no LE edema Respiratory: normal effort BS slightly diminished in bases but clear i hear no wheeze no rhonche  Discharge Instructions You were cared for by a hospitalist during your hospital stay. If you have any questions about your discharge medications or the care you received while you were in the hospital after you are discharged, you can call the unit and asked to speak with the hospitalist on call if the hospitalist that took care of you is not available. Once you are discharged, your primary care physician will handle any further medical issues. Please note that NO REFILLS for any discharge medications will be authorized once you are discharged, as it is imperative that you return to your primary care physician (or establish a relationship with a primary care physician if you do not have one) for your aftercare needs so that they can reassess your need for medications and monitor your lab values.   Current Discharge Medication List    START taking these medications   Details  benzonatate (TESSALON) 100 MG capsule Take 1 capsule (100 mg total) by mouth 3 (three) times daily as needed for cough. Qty: 20 capsule, Refills: 0    cefUROXime (CEFTIN) 250 MG tablet Take 1 tablet (250 mg total) by mouth 2 (two) times daily with a meal. Qty: 8 tablet, Refills: 0    feeding supplement, ENSURE COMPLETE, (ENSURE COMPLETE) LIQD Take 237 mLs by mouth 2 (two) times daily between meals.    menthol-cetylpyridinium (CEPACOL) 3 MG lozenge Take 1 lozenge (3 mg total) by mouth as needed for sore throat. Qty: 100 tablet, Refills: 12      CONTINUE these medications which have NOT CHANGED   Details  metoprolol (LOPRESSOR) 50 MG tablet Take 50-75 mg by mouth 2 (two) times daily. 75 mg each morning and 50 mg at bedtime.    valsartan (DIOVAN) 80 MG tablet Take 1 tablet by mouth daily.    alendronate (FOSAMAX) 70 MG tablet TAKE 1 TAB EACH WEEK 30 MIN  PRIOR TO BREAKFAST WITH LARGE GLASS OF WATER. REMAIN UPRIGHT. Qty: 4 tablet, Refills: 3    allopurinol (ZYLOPRIM) 100 MG tablet TAKE ONE TABLET BY MOUTH ONCE DAILY. Qty: 30 tablet, Refills: 3    ASPIRIN LOW DOSE 81 MG EC tablet TAKE ONE TABLET BY MOUTH ONCE DAILY. Qty: 30 tablet, Refills: 3    Calcium Carb-Cholecalciferol 500-400 MG-UNIT TABS TAKE (1) TABLET BY MOUTH (3) TIMES DAILY. Qty: 90 tablet, Refills: 6    COLACE 50 MG capsule TAKE (1) CAPSULE BY MOUTH TWICE DAILY. Qty: 60 capsule, Refills: 3    fluticasone (FLONASE) 50 MCG/ACT nasal spray USE 1 SPRAY IN EACH NOSTRIL ONCE DAILY. Qty: 16 g, Refills: 2    furosemide (LASIX) 40 MG tablet TAKE ONE TABLET BY MOUTH ONCE DAILY. Qty: 30 tablet, Refills: 0    HYDROcodone-acetaminophen (NORCO/VICODIN) 5-325 MG per tablet Take 1 tablet by mouth as needed.     polyethylene glycol powder (GLYCOLAX/MIRALAX) powder Take 17 g by mouth daily as needed (for constipation).    potassium chloride (K-DUR) 10 MEQ tablet TAKE 1 TABLET BY MOUTH ON MONDAY,WEDNESDAY AND FRIDAY. Qty: 12 tablet, Refills: 3    simvastatin (ZOCOR) 20 MG tablet TAKE (1) TABLET BY MOUTH AT BEDTIME FOR CHOLESTEROL. Qty: 30 tablet, Refills: 3    !!  warfarin (COUMADIN) 1 MG tablet TAKE 1 TABLET BY MOUTH ONCE A DAY,ALONG WITH 2.5 MG DAILY TO EQUAL 3.5 MG. Qty: 30 tablet, Refills: 3    !! warfarin (COUMADIN) 2.5 MG tablet TAKE 1 TABLET BY MOUTH ONCE A DAY,ALONG WITH 1 MG TO EQUAL 3.5 MG DAILY. Qty: 30 tablet, Refills: 3    Wheat Dextrin (BENEFIBER PO) Take 1 each by mouth daily.     !! - Potential duplicate medications found. Please discuss with provider.    STOP taking these medications     calcium-vitamin D (OSCAL WITH D) 500-200 MG-UNIT per tablet        No Known Allergies Follow-up Information    Follow up with Judy Overman, MD. Schedule an appointment as soon as possible for a visit in 2 weeks.   Specialty:  Family Medicine   Why:  for evaluation of  resolution of UTI   Contact information:   [address] [place] [address] [phone]        The results of significant diagnostics from this hospitalization (including imaging, microbiology, ancillary and laboratory) are listed below for reference.    Significant Diagnostic Studies: Dg Chest 2 View  01/30/2014   CLINICAL DATA:  Cough and congestion. With history of hypertension valvular heart disease and chronic renal insufficiency  EXAM: CHEST  2 VIEW  COMPARISON:  Portable chest x-ray of March 04, 2013.  FINDINGS: The lungs are mildly hyperinflated but clear. There is hemidiaphragm flattening. The cardiac silhouette is enlarged but stable. The pulmonary vascularity is not engorged. There is tortuosity of the descending thoracic aorta. There is severe degenerative change of the right shoulder with milder degenerative change of the left shoulder.  IMPRESSION: COPD and cardiomegaly.  There is no evidence of pneumonia nor CHF.   Electronically Signed   By: David  [place]   On: 01/30/2014 16:57   Dg Chest Portable 1 View  02/03/2014   CLINICAL DATA:  Weakness and cough. The patient is unsure of time frame.  EXAM: PORTABLE CHEST - 1 VIEW  COMPARISON:  None.  FINDINGS: The heart size and mediastinal contours are stable. The aorta is tortuous. Heart size is enlarged. There is no focal infiltrate, pulmonary edema, or pleural effusion. The lungs are hyperinflated. The visualized skeletal structures are stable.  IMPRESSION: Emphysema.  No acute cardiopulmonary disease.   Electronically Signed   By: Sherian Rein M.D.   On: 02/03/2014 12:34    Microbiology: Recent Results (from the past 240 hour(s))  MRSA PCR Screening     Status: None   Collection Time: 02/03/14  4:01 PM  Result Value Ref Range Status   MRSA by PCR NEGATIVE NEGATIVE Final    Comment:        The GeneXpert MRSA Assay (FDA approved for NASAL specimens only), is one component of a comprehensive MRSA  colonization surveillance program. It is not intended to diagnose MRSA infection nor to guide or monitor treatment for MRSA infections.   Rapid strep screen     Status: None   Collection Time: 02/03/14  5:15 PM  Result Value Ref Range Status   Streptococcus, Group A Screen (Direct) NEGATIVE NEGATIVE Final    Comment: (NOTE) A Rapid Antigen test may result negative if the antigen level in the sample is below the detection level of this test. The FDA has not cleared this test as a stand-alone test therefore the rapid antigen negative result has reflexed to a Group A Strep culture.  Culture, Group A Strep     Status: None (Preliminary result)   Collection Time: 02/03/14  5:15 PM  Result Value Ref Range Status   Specimen Description THROAT  Final   Special Requests NONE  Final   Culture   Final    NO SUSPICIOUS COLONIES, CONTINUING TO HOLD Performed at [facility]    Report Status PENDING  Incomplete     Labs: Basic Metabolic Panel:  Recent Labs Lab 02/03/14 1147 02/04/14 0634 02/05/14 0618  NA 135* 137 139  K 4.5 3.9 4.0  CL 93* 100 100  CO2 22 24 25   GLUCOSE 77 97 91  BUN 29* 29* 19  CREATININE 1.38* 1.37* 1.18*  CALCIUM 10.3 8.9 9.2   Liver Function Tests:  Recent Labs Lab 02/03/14 1147 02/04/14 0634  AST 44* 36  ALT 16 13  ALKPHOS 60 45  BILITOT 1.9* 1.0  PROT 8.1 6.4  ALBUMIN 3.8 3.1*   No results for input(s): LIPASE, AMYLASE in the last 168 hours. No results for input(s): AMMONIA in the last 168 hours. CBC:  Recent Labs Lab 02/03/14 1147 02/04/14 0634  WBC 6.1 5.6  NEUTROABS 4.7  --   HGB 13.7 10.8*  HCT 39.1 33.1*  MCV 98.0 93.5  PLT 140* 134*   Cardiac Enzymes:  Recent Labs Lab 02/03/14 1147 02/03/14 1807 02/04/14 0634  TROPONINI <0.30 <0.30 <0.30   BNP: BNP (last 3 results)  Recent Labs  03/04/13 1243  PROBNP 4971.0*   CBG: No results for input(s): GLUCAP in the last 168 hours.     SignedGwenyth Bender:  Kenyatta Keidel  M  [facility] 02/05/2014, 11:20 AM

## 2014-02-05 NOTE — Care Management Note (Signed)
    Page 1 of 1   02/05/2014     3:02:38 PM CARE MANAGEMENT NOTE 02/05/2014  Patient:  Judy Grant,Judy Grant   Account Number:  [id number]  Date Initiated:  02/05/2014  Documentation initiated by:  Judy Grant,Judy Grant  Subjective/Objective Assessment:   Pt and admitted with A Fib with RVR and a UTI. She is from home. She lives with a son who has CP. They care for each other with the assistance of her daughter, other family, and a CAP aide who comes for 4 hours a day for each of them, to     Action/Plan:   total 8 hrs a day. [facility]  She has [facility] active as CAP provider, and would to continue with them   Anticipated DC Date:  02/05/2014   Anticipated DC Plan:  HOME W HOME HEALTH SERVICES      DC Planning Services  CM consult      [facility]  Resumption Of Svcs/PTA Provider   Choice offered to / List presented to:  C-1 Patient        HH arranged  HH-1 RN  HH-2 PT      [facility]  [facility]   Status of service:  Completed, signed off Medicare Important Message given?  NA - LOS <3 / Initial given by admissions (If response is "NO", the following Medicare IM given date fields will be blank) Date Medicare IM given:   Medicare IM given by:   Date Additional Medicare IM given:   Additional Medicare IM given by:    Discharge Disposition:  HOME W HOME HEALTH SERVICES  Per UR Regulation:  Reviewed for med. necessity/level of care/duration of stay  If discussed at Long Length of Stay Meetings, dates discussed:    Comments:  02/05/14 1500 Judy HendersonGeneva Shavy Beachem RN/CM

## 2014-02-07 ENCOUNTER — Telehealth: Payer: Self-pay | Admitting: Family Medicine

## 2014-02-07 LAB — URINE CULTURE: Colony Count: >=100000

## 2014-02-07 NOTE — Telephone Encounter (Signed)
Noted  

## 2014-02-08 LAB — CULTURE, BLOOD (ROUTINE X 2)
Culture: NO GROWTH
Culture: NO GROWTH

## 2014-02-11 ENCOUNTER — Emergency Department (HOSPITAL_COMMUNITY): Payer: PRIVATE HEALTH INSURANCE

## 2014-02-11 ENCOUNTER — Observation Stay (HOSPITAL_COMMUNITY)
Admission: EM | Admit: 2014-02-11 | Discharge: 2014-02-12 | Disposition: A | Discharge status: Home / Self Care | Payer: PRIVATE HEALTH INSURANCE | Attending: Internal Medicine | Admitting: Internal Medicine

## 2014-02-11 ENCOUNTER — Encounter (HOSPITAL_COMMUNITY): Payer: Self-pay | Admitting: *Deleted

## 2014-02-11 DIAGNOSIS — Z7901 Long term (current) use of anticoagulants: Secondary | ICD-10-CM

## 2014-02-11 DIAGNOSIS — Z7982 Long term (current) use of aspirin: Secondary | ICD-10-CM | POA: Diagnosis not present

## 2014-02-11 DIAGNOSIS — S0993XA Unspecified injury of face, initial encounter: Secondary | ICD-10-CM | POA: Insufficient documentation

## 2014-02-11 DIAGNOSIS — I480 Paroxysmal atrial fibrillation: Secondary | ICD-10-CM | POA: Diagnosis present

## 2014-02-11 DIAGNOSIS — K5730 Diverticulosis of large intestine without perforation or abscess without bleeding: Secondary | ICD-10-CM | POA: Diagnosis not present

## 2014-02-11 DIAGNOSIS — N189 Chronic kidney disease, unspecified: Secondary | ICD-10-CM | POA: Diagnosis present

## 2014-02-11 DIAGNOSIS — Y998 Other external cause status: Secondary | ICD-10-CM | POA: Diagnosis not present

## 2014-02-11 DIAGNOSIS — I482 Chronic atrial fibrillation, unspecified: Secondary | ICD-10-CM

## 2014-02-11 DIAGNOSIS — D509 Iron deficiency anemia, unspecified: Secondary | ICD-10-CM | POA: Insufficient documentation

## 2014-02-11 DIAGNOSIS — I5042 Chronic combined systolic (congestive) and diastolic (congestive) heart failure: Secondary | ICD-10-CM | POA: Diagnosis present

## 2014-02-11 DIAGNOSIS — N390 Urinary tract infection, site not specified: Secondary | ICD-10-CM | POA: Diagnosis present

## 2014-02-11 DIAGNOSIS — S060X0A Concussion without loss of consciousness, initial encounter: Secondary | ICD-10-CM | POA: Diagnosis not present

## 2014-02-11 DIAGNOSIS — S0990XA Unspecified injury of head, initial encounter: Secondary | ICD-10-CM | POA: Insufficient documentation

## 2014-02-11 DIAGNOSIS — Z8673 Personal history of transient ischemic attack (TIA), and cerebral infarction without residual deficits: Secondary | ICD-10-CM | POA: Diagnosis not present

## 2014-02-11 DIAGNOSIS — S4992XA Unspecified injury of left shoulder and upper arm, initial encounter: Secondary | ICD-10-CM | POA: Diagnosis not present

## 2014-02-11 DIAGNOSIS — Y9389 Activity, other specified: Secondary | ICD-10-CM | POA: Insufficient documentation

## 2014-02-11 DIAGNOSIS — M109 Gout, unspecified: Secondary | ICD-10-CM | POA: Diagnosis not present

## 2014-02-11 DIAGNOSIS — W06XXXA Fall from bed, initial encounter: Secondary | ICD-10-CM | POA: Insufficient documentation

## 2014-02-11 DIAGNOSIS — R6251 Failure to thrive (child): Secondary | ICD-10-CM | POA: Diagnosis present

## 2014-02-11 DIAGNOSIS — Z7951 Long term (current) use of inhaled steroids: Secondary | ICD-10-CM | POA: Insufficient documentation

## 2014-02-11 DIAGNOSIS — Z79899 Other long term (current) drug therapy: Secondary | ICD-10-CM | POA: Diagnosis not present

## 2014-02-11 DIAGNOSIS — S161XXA Strain of muscle, fascia and tendon at neck level, initial encounter: Secondary | ICD-10-CM | POA: Diagnosis not present

## 2014-02-11 DIAGNOSIS — W19XXXA Unspecified fall, initial encounter: Secondary | ICD-10-CM

## 2014-02-11 DIAGNOSIS — G8190 Hemiplegia, unspecified affecting unspecified side: Secondary | ICD-10-CM

## 2014-02-11 DIAGNOSIS — Y92009 Unspecified place in unspecified non-institutional (private) residence as the place of occurrence of the external cause: Secondary | ICD-10-CM | POA: Diagnosis not present

## 2014-02-11 DIAGNOSIS — E785 Hyperlipidemia, unspecified: Secondary | ICD-10-CM | POA: Insufficient documentation

## 2014-02-11 DIAGNOSIS — I10 Essential (primary) hypertension: Secondary | ICD-10-CM | POA: Diagnosis present

## 2014-02-11 DIAGNOSIS — I5040 Unspecified combined systolic (congestive) and diastolic (congestive) heart failure: Secondary | ICD-10-CM | POA: Diagnosis not present

## 2014-02-11 DIAGNOSIS — M7120 Synovial cyst of popliteal space [Baker], unspecified knee: Secondary | ICD-10-CM | POA: Insufficient documentation

## 2014-02-11 DIAGNOSIS — R531 Weakness: Secondary | ICD-10-CM | POA: Diagnosis not present

## 2014-02-11 DIAGNOSIS — I129 Hypertensive chronic kidney disease with stage 1 through stage 4 chronic kidney disease, or unspecified chronic kidney disease: Secondary | ICD-10-CM | POA: Diagnosis not present

## 2014-02-11 DIAGNOSIS — Z87891 Personal history of nicotine dependence: Secondary | ICD-10-CM | POA: Insufficient documentation

## 2014-02-11 DIAGNOSIS — R52 Pain, unspecified: Secondary | ICD-10-CM

## 2014-02-11 DIAGNOSIS — M19011 Primary osteoarthritis, right shoulder: Secondary | ICD-10-CM | POA: Diagnosis present

## 2014-02-11 DIAGNOSIS — R627 Adult failure to thrive: Secondary | ICD-10-CM | POA: Diagnosis not present

## 2014-02-11 DIAGNOSIS — S199XXA Unspecified injury of neck, initial encounter: Secondary | ICD-10-CM | POA: Diagnosis present

## 2014-02-11 HISTORY — DX: Non-ST elevation (NSTEMI) myocardial infarction: I21.4

## 2014-02-11 HISTORY — DX: Chronic combined systolic (congestive) and diastolic (congestive) heart failure: I50.42

## 2014-02-11 LAB — URINALYSIS, ROUTINE W REFLEX MICROSCOPIC
BILIRUBIN URINE: NEGATIVE
Glucose, UA: NEGATIVE mg/dL
Ketones, ur: NEGATIVE mg/dL
NITRITE: NEGATIVE
Protein, ur: 100 mg/dL — AB
Specific Gravity, Urine: 1.025 (ref 1.005–1.030)
UROBILINOGEN UA: 0.2 mg/dL (ref 0.0–1.0)
pH: 5.5 (ref 5.0–8.0)

## 2014-02-11 LAB — RAPID URINE DRUG SCREEN, HOSP PERFORMED
AMPHETAMINES: NOT DETECTED
BARBITURATES: NOT DETECTED
Benzodiazepines: NOT DETECTED
COCAINE: NOT DETECTED
Opiates: POSITIVE — AB
TETRAHYDROCANNABINOL: NOT DETECTED

## 2014-02-11 LAB — DIFFERENTIAL
BASOS PCT: 0 % (ref 0–1)
Basophils Absolute: 0 10*3/uL (ref 0.0–0.1)
Eosinophils Absolute: 0 10*3/uL (ref 0.0–0.7)
Eosinophils Relative: 0 % (ref 0–5)
Lymphocytes Relative: 6 % — ABNORMAL LOW (ref 12–46)
Lymphs Abs: 0.8 10*3/uL (ref 0.7–4.0)
MONO ABS: 1.4 10*3/uL — AB (ref 0.1–1.0)
Monocytes Relative: 11 % (ref 3–12)
NEUTROS PCT: 83 % — AB (ref 43–77)
Neutro Abs: 11.4 10*3/uL — ABNORMAL HIGH (ref 1.7–7.7)

## 2014-02-11 LAB — CBC
HEMATOCRIT: 37.1 % (ref 36.0–46.0)
Hemoglobin: 12.2 g/dL (ref 12.0–15.0)
MCH: 31.3 pg (ref 26.0–34.0)
MCHC: 32.9 g/dL (ref 30.0–36.0)
MCV: 95.1 fL (ref 78.0–100.0)
Platelets: 273 10*3/uL (ref 150–400)
RBC: 3.9 MIL/uL (ref 3.87–5.11)
RDW: 13.4 % (ref 11.5–15.5)
WBC: 13.7 10*3/uL — AB (ref 4.0–10.5)

## 2014-02-11 LAB — COMPREHENSIVE METABOLIC PANEL
ALBUMIN: 3.1 g/dL — AB (ref 3.5–5.2)
ALT: 11 U/L (ref 0–35)
AST: 18 U/L (ref 0–37)
Alkaline Phosphatase: 64 U/L (ref 39–117)
Anion gap: 13 (ref 5–15)
BUN: 17 mg/dL (ref 6–23)
CALCIUM: 10.4 mg/dL (ref 8.4–10.5)
CHLORIDE: 95 meq/L — AB (ref 96–112)
CO2: 27 mEq/L (ref 19–32)
CREATININE: 1.03 mg/dL (ref 0.50–1.10)
GFR calc Af Amer: 52 mL/min — ABNORMAL LOW (ref >90)
GFR calc non Af Amer: 45 mL/min — ABNORMAL LOW (ref >90)
Glucose, Bld: 105 mg/dL — ABNORMAL HIGH (ref 70–99)
Potassium: 4.2 mEq/L (ref 3.7–5.3)
SODIUM: 135 meq/L — AB (ref 137–147)
Total Bilirubin: 1.2 mg/dL (ref 0.3–1.2)
Total Protein: 7.7 g/dL (ref 6.0–8.3)

## 2014-02-11 LAB — TROPONIN I: Troponin I: <0.3 ng/mL (ref <0.30)

## 2014-02-11 LAB — PROTIME-INR
INR: 3.04 — AB (ref 0.00–1.49)
PROTHROMBIN TIME: 31.7 s — AB (ref 11.6–15.2)

## 2014-02-11 LAB — URINE MICROSCOPIC-ADD ON

## 2014-02-11 LAB — CK: Total CK: 136 U/L (ref 7–177)

## 2014-02-11 LAB — APTT: aPTT: 43 seconds — ABNORMAL HIGH (ref 24–37)

## 2014-02-11 LAB — ETHANOL

## 2014-02-11 MED ORDER — METOPROLOL TARTRATE 50 MG PO TABS
50.0000 mg | ORAL_TABLET | Freq: Once | ORAL | Status: AC
  Administered 2014-02-11: 50 mg via ORAL
  Filled 2014-02-11: qty 1

## 2014-02-11 MED ORDER — MORPHINE SULFATE 2 MG/ML IJ SOLN: 1.0000 mg | INTRAMUSCULAR | Status: DC | PRN

## 2014-02-11 MED ORDER — DICLOFENAC SODIUM 1 % TD GEL
2.0000 g | Freq: Three times a day (TID) | TRANSDERMAL | Status: DC
  Administered 2014-02-11 – 2014-02-12 (×2): 2 g via TOPICAL
  Filled 2014-02-11: qty 100

## 2014-02-11 MED ORDER — ALUM & MAG HYDROXIDE-SIMETH 200-200-20 MG/5ML PO SUSP: 30.0000 mL | Freq: Four times a day (QID) | ORAL | Status: DC | PRN

## 2014-02-11 MED ORDER — ACETAMINOPHEN 500 MG PO TABS
500.0000 mg | ORAL_TABLET | Freq: Three times a day (TID) | ORAL | Status: DC
  Administered 2014-02-11 – 2014-02-12 (×3): 500 mg via ORAL
  Filled 2014-02-11 (×3): qty 1

## 2014-02-11 MED ORDER — POLYETHYLENE GLYCOL 3350 17 G PO PACK: 17.0000 g | PACK | Freq: Every day | ORAL | Status: DC | PRN

## 2014-02-11 MED ORDER — METOPROLOL TARTRATE 25 MG PO TABS
75.0000 mg | ORAL_TABLET | Freq: Every day | ORAL | Status: DC
  Administered 2014-02-12: 75 mg via ORAL
  Filled 2014-02-11 (×2): qty 1

## 2014-02-11 MED ORDER — GUAIFENESIN-DM 100-10 MG/5ML PO SYRP: 5.0000 mL | ORAL_SOLUTION | ORAL | Status: DC | PRN

## 2014-02-11 MED ORDER — POLYETHYLENE GLYCOL 3350 17 GM/SCOOP PO POWD: 17.0000 g | Freq: Every day | ORAL | Status: DC | PRN

## 2014-02-11 MED ORDER — ONDANSETRON HCL 4 MG/2ML IJ SOLN: 4.0000 mg | Freq: Four times a day (QID) | INTRAMUSCULAR | Status: DC | PRN

## 2014-02-11 MED ORDER — SENNA 8.6 MG PO TABS
1.0000 | ORAL_TABLET | Freq: Two times a day (BID) | ORAL | Status: DC
  Administered 2014-02-11 – 2014-02-12 (×2): 8.6 mg via ORAL
  Filled 2014-02-11 (×2): qty 1

## 2014-02-11 MED ORDER — ONDANSETRON HCL 4 MG PO TABS: 4.0000 mg | ORAL_TABLET | Freq: Four times a day (QID) | ORAL | Status: DC | PRN

## 2014-02-11 MED ORDER — DILTIAZEM HCL 25 MG/5ML IV SOLN
5.0000 mg | Freq: Once | INTRAVENOUS | Status: AC
  Administered 2014-02-11: 5 mg via INTRAVENOUS
  Filled 2014-02-11: qty 5

## 2014-02-11 MED ORDER — WARFARIN - PHARMACIST DOSING INPATIENT: Status: DC

## 2014-02-11 MED ORDER — LEVALBUTEROL HCL 0.63 MG/3ML IN NEBU: 0.6300 mg | INHALATION_SOLUTION | Freq: Four times a day (QID) | RESPIRATORY_TRACT | Status: DC | PRN

## 2014-02-11 MED ORDER — ALLOPURINOL 100 MG PO TABS
100.0000 mg | ORAL_TABLET | Freq: Every day | ORAL | Status: DC
  Administered 2014-02-11 – 2014-02-12 (×2): 100 mg via ORAL
  Filled 2014-02-11 (×2): qty 1

## 2014-02-11 MED ORDER — FENTANYL CITRATE 0.05 MG/ML IJ SOLN
50.0000 ug | Freq: Once | INTRAMUSCULAR | Status: AC
  Administered 2014-02-11: 50 ug via INTRAVENOUS
  Filled 2014-02-11: qty 2

## 2014-02-11 MED ORDER — DICLOFENAC SODIUM 1 % TD GEL
TRANSDERMAL | Status: AC
  Filled 2014-02-11: qty 100

## 2014-02-11 MED ORDER — OXYCODONE HCL 5 MG PO TABS
5.0000 mg | ORAL_TABLET | ORAL | Status: DC | PRN
  Administered 2014-02-12 (×2): 5 mg via ORAL
  Filled 2014-02-11 (×2): qty 1

## 2014-02-11 MED ORDER — FLUTICASONE PROPIONATE 50 MCG/ACT NA SUSP
1.0000 | Freq: Every day | NASAL | Status: DC
  Administered 2014-02-11 – 2014-02-12 (×2): 1 via NASAL
  Filled 2014-02-11: qty 16

## 2014-02-11 MED ORDER — METOPROLOL TARTRATE 50 MG PO TABS
50.0000 mg | ORAL_TABLET | Freq: Every day | ORAL | Status: DC
  Administered 2014-02-11: 50 mg via ORAL
  Filled 2014-02-11: qty 1

## 2014-02-11 MED ORDER — DEXTROSE 5 % IV SOLN
1.0000 g | INTRAVENOUS | Status: DC
  Administered 2014-02-12: 1 g via INTRAVENOUS
  Filled 2014-02-11 (×2): qty 1

## 2014-02-11 MED ORDER — DILTIAZEM HCL 30 MG PO TABS
30.0000 mg | ORAL_TABLET | Freq: Four times a day (QID) | ORAL | Status: DC
  Administered 2014-02-12 (×2): 30 mg via ORAL
  Filled 2014-02-11 (×2): qty 1

## 2014-02-11 MED ORDER — ENSURE COMPLETE PO LIQD
237.0000 mL | Freq: Two times a day (BID) | ORAL | Status: DC
  Administered 2014-02-12 (×2): 237 mL via ORAL

## 2014-02-11 MED ORDER — IRBESARTAN 75 MG PO TABS
75.0000 mg | ORAL_TABLET | Freq: Every day | ORAL | Status: DC
  Administered 2014-02-11 – 2014-02-12 (×2): 75 mg via ORAL
  Filled 2014-02-11 (×2): qty 1

## 2014-02-11 MED ORDER — DEXTROSE 5 % IV SOLN
1.0000 g | Freq: Once | INTRAVENOUS | Status: AC
  Administered 2014-02-11: 1 g via INTRAVENOUS
  Filled 2014-02-11: qty 10

## 2014-02-11 MED ORDER — SODIUM CHLORIDE 0.9 % IV SOLN
INTRAVENOUS | Status: DC
  Administered 2014-02-11 – 2014-02-12 (×2): via INTRAVENOUS

## 2014-02-11 MED ORDER — POTASSIUM CHLORIDE ER 10 MEQ PO TBCR: 10.0000 meq | EXTENDED_RELEASE_TABLET | Freq: Every day | ORAL | Status: DC

## 2014-02-11 MED ORDER — POTASSIUM CHLORIDE CRYS ER 10 MEQ PO TBCR
10.0000 meq | EXTENDED_RELEASE_TABLET | ORAL | Status: DC
  Administered 2014-02-12: 10 meq via ORAL
  Filled 2014-02-11 (×2): qty 1

## 2014-02-11 MED ORDER — FENTANYL CITRATE 0.05 MG/ML IJ SOLN
100.0000 ug | Freq: Once | INTRAMUSCULAR | Status: AC
  Administered 2014-02-11: 100 ug via INTRAVENOUS
  Filled 2014-02-11: qty 2

## 2014-02-11 MED ORDER — BENZONATATE 100 MG PO CAPS: 100.0000 mg | ORAL_CAPSULE | Freq: Three times a day (TID) | ORAL | Status: DC | PRN

## 2014-02-11 MED ORDER — FUROSEMIDE 40 MG PO TABS
40.0000 mg | ORAL_TABLET | Freq: Every day | ORAL | Status: DC
  Administered 2014-02-11 – 2014-02-12 (×2): 40 mg via ORAL
  Filled 2014-02-11 (×2): qty 1

## 2014-02-11 MED ORDER — SODIUM CHLORIDE 0.9 % IV BOLUS (SEPSIS)
250.0000 mL | Freq: Once | INTRAVENOUS | Status: AC
  Administered 2014-02-11: 250 mL via INTRAVENOUS

## 2014-02-11 NOTE — ED Notes (Signed)
Patient c/o generalized weakness, left side jaw and shoulder pain x past few days

## 2014-02-11 NOTE — ED Notes (Addendum)
Patient unable to ambulate, uses scooter at home.

## 2014-02-11 NOTE — Progress Notes (Signed)
ANTICOAGULATION CONSULT NOTE - Initial Consult  Pharmacy Consult for Coumadin (chronic Rx PTA) Indication: atrial fibrillation  No Known Allergies  Patient Measurements: Height: 5\' 6"  (167.6 cm) Weight: 139 lb 5.3 oz (63.2 kg) IBW/kg (Calculated) : 59.3  Vital Signs: Temp: 99.3 F (37.4 C) (11/17 1622) Temp Source: Oral (11/17 1622) BP: 154/123 mmHg (11/17 1622) Pulse Rate: 112 (11/17 1622)  Labs:  Recent Labs  02/11/14 1041 02/11/14 1044  HGB 12.2  --   HCT 37.1  --   PLT 273  --   APTT 43*  --   LABPROT 31.7*  --   INR 3.04*  --   CREATININE 1.03  --   CKTOTAL  --  136  TROPONINI <0.30  --    Estimated Creatinine Clearance: 31.3 mL/min (by C-G formula based on Cr of 1.03).  Medical History: Past Medical History  Diagnosis Date  . Hyperlipidemia   . Osteoporosis   . Hypertension     with severe left ventricular hypertrophy; normal ejection fraction in 04/2005  . Valvular heart disease     Mild to moderate aortic stenosis; moderate to severe mitral stenosis in 2007; mild MR  . Paroxysmal atrial fibrillation     Remote  . Diverticulosis     Pancolonic; h/o LGI bleeding and diverticulitis  . Degenerative joint disease     Knees  . Chronic kidney disease     Mild; creatinine of 1.19-1.28 in recent years  . Anemia, iron deficiency   . Popliteal cyst     Left  . Fracture of wrist 2008    left  . Gout   . Stroke right side deficit  . NSTEMI, initial episode of care 01/05/2012  . Chronic combined systolic and diastolic congestive heart failure 02/2013    EF 30%; Grade 2 diastolic dys   Medications:  Prescriptions prior to admission  Medication Sig Dispense Refill Last Dose  . alendronate (FOSAMAX) 70 MG tablet TAKE 1 TAB EACH WEEK 30 MIN PRIOR TO BREAKFAST WITH LARGE GLASS OF WATER. REMAIN UPRIGHT. 4 tablet 3 Past Week at Unknown time  . allopurinol (ZYLOPRIM) 100 MG tablet TAKE ONE TABLET BY MOUTH ONCE DAILY. 30 tablet 3 02/10/2014 at Unknown time  .  ASPIRIN LOW DOSE 81 MG EC tablet TAKE ONE TABLET BY MOUTH ONCE DAILY. 30 tablet 3 02/10/2014 at Unknown time  . benzonatate (TESSALON) 100 MG capsule Take 1 capsule (100 mg total) by mouth 3 (three) times daily as needed for cough. 20 capsule 0 Past Week at Unknown time  . Calcium Carb-Cholecalciferol 500-400 MG-UNIT TABS TAKE (1) TABLET BY MOUTH (3) TIMES DAILY. 90 tablet 6 02/10/2014 at Unknown time  . COLACE 50 MG capsule TAKE (1) CAPSULE BY MOUTH TWICE DAILY. 60 capsule 3 02/10/2014 at Unknown time  . feeding supplement, ENSURE COMPLETE, (ENSURE COMPLETE) LIQD Take 237 mLs by mouth 2 (two) times daily between meals.   02/10/2014 at Unknown time  . fluticasone (FLONASE) 50 MCG/ACT nasal spray USE 1 SPRAY IN EACH NOSTRIL ONCE DAILY. 16 g 2 02/10/2014 at Unknown time  . furosemide (LASIX) 40 MG tablet TAKE ONE TABLET BY MOUTH ONCE DAILY. 30 tablet 0 02/10/2014 at Unknown time  . HYDROcodone-acetaminophen (NORCO/VICODIN) 5-325 MG per tablet Take 1 tablet by mouth every 6 (six) hours as needed (pain).    02/10/2014 at Unknown time  . menthol-cetylpyridinium (CEPACOL) 3 MG lozenge Take 1 lozenge (3 mg total) by mouth as needed for sore throat. 100 tablet 12 Past Week  at Unknown time  . metoprolol (LOPRESSOR) 50 MG tablet Take 50-75 mg by mouth 2 (two) times daily. 75 mg each morning and 50 mg at bedtime.   02/10/2014 at 800  . polyethylene glycol powder (GLYCOLAX/MIRALAX) powder Take 17 g by mouth daily as needed (for constipation).   02/10/2014 at Unknown time  . potassium chloride (K-DUR) 10 MEQ tablet TAKE 1 TABLET BY MOUTH ON MONDAY,WEDNESDAY AND FRIDAY. 12 tablet 3 02/10/2014 at Unknown time  . simvastatin (ZOCOR) 20 MG tablet TAKE (1) TABLET BY MOUTH AT BEDTIME FOR CHOLESTEROL. 30 tablet 3 02/10/2014 at Unknown time  . valsartan (DIOVAN) 80 MG tablet Take 1 tablet by mouth daily.   02/10/2014 at Unknown time  . warfarin (COUMADIN) 1 MG tablet Take 3.5-5 mg by mouth daily. Takes with 2.5mg  daily for  a total of 3.5mg  daily except on Mondays patient takes 5mg (2 of the 2.5mg )   02/10/2014 at Unknown time  . warfarin (COUMADIN) 2.5 MG tablet Take 3.5-5 mg by mouth daily. Patient takes with 1mg  for a total of 3.5mg  except on Monday patient takes 5mg (2 of the 2.5mg )   02/10/2014 at Unknown time  . Wheat Dextrin (BENEFIBER PO) Take 1 each by mouth daily.   02/10/2014 at Unknown time  . cefUROXime (CEFTIN) 250 MG tablet Take 1 tablet (250 mg total) by mouth 2 (two) times daily with a meal. (Patient not taking: Reported on 02/11/2014) 8 tablet 0 Completed Course at Unknown time  . warfarin (COUMADIN) 1 MG tablet TAKE 1 TABLET BY MOUTH ONCE A DAY,ALONG WITH 2.5 MG DAILY TO EQUAL 3.5 MG. (Patient not taking: Reported on 02/11/2014) 30 tablet 3   . warfarin (COUMADIN) 2.5 MG tablet TAKE 1 TABLET BY MOUTH ONCE A DAY,ALONG WITH 1 MG TO EQUAL 3.5 MG DAILY. (Patient not taking: Reported on 02/11/2014) 30 tablet 3    Assessment: 80yo female on chronic Coumadin PTA for h/o afib.  Home dose listed above.  INR is slightly above goal today.    Goal of Therapy:  INR 2-3 Monitor platelets by anticoagulation protocol: Yes   Plan:  No coumadin tonight INR daily  Valrie HartHall, Kerney Hopfensperger A 02/11/2014,6:44 PM

## 2014-02-11 NOTE — Clinical Social Work Note (Signed)
CSW consulted by CM for ALF questions. CSW met with pt and pt's daughter Helene Kelp at bedside in ED. Pt's sister and niece were also present. Pt states she just "hurts all over." She was d/c from hospital last week with home health. Pt lives with her son who is disabled. Helene Kelp reports they have a CAP aid from 8-6 Monday through Friday and 8-1 on Saturday. Pt has been having more difficulty managing at home since d/c and they would like to consider placing pt and her son at ALF. CSW discussed placement process and provided ALF list so they can contact facilities who may be interested in taking both of them. Helene Kelp is aware that they will need TB skin test and FL2 completed by PCP. Helene Kelp was appreciative of information. EDP and CM notified of above.   Benay Pike, [place]

## 2014-02-11 NOTE — Care Management Note (Signed)
Patient's nephew, Bernette RedbirdKenny, came by CM office and asked about placement for his aunt. Explained that CSW does placement, and that sometimes it is difficult to do this from ED,  but sometimes it can be done. His Aunt cares for a disabled son, she is an adult with CP.  Family would like for him to go also. Bernette RedbirdKenny brings another nephew and patient's granddaughter to hear the difference between an ALF, SNF and NH. This was explained briefly and  CSW was notified to come visit and give information about ALF. [facility] when pt returns home, as they need to find an ALF that can take both patient and son. Pt being admitted to [facility] overnight, so CSW  may see again in am.

## 2014-02-11 NOTE — H&P (Signed)
[facility] History and Physical  Judy Grant:[id number] DOB: 10/07/1917 DOA: 02/11/2014  Referring physician: ED physician, Dr. Bebe Shaggy PCP: [name], MD   Chief Complaint: right shoulder pain after falling out of bed; failure to thrive; generalized weakness.  HPI: Judy Grant is a 80 y.o. female with a history of chronic systolic and diastolic heart failure with an ejection fraction of 30% and grade 2 diastolic dysfunction per echo in the summer 2014; chronic atrial fibrillation-on Coumadin, stage II  chronic kidney disease, and hypertension. She was recently hospitalized and discharged on 02/05/14 for treatment of urinary tract infection and atrial fibrillation with rapid ventricular response. The history is being provided by both the patient and ED records. She was doing well up until a few days following discharge, when she apparently fell on the floor as she was trying to get up to go to the bedside commode. She fell on her right side. She says that she slid on the floor between her bed and the bedside commode. She complains of right shoulder pain. She denies losing consciousness. She denies a headache or head trauma. Since the fall, the family reported that she had not been herself. She is chronically wheelchair-bound, but got around in her wheelchair at home without too much difficulty up until recently. She has been more generally weak and unable to provide for herself and her disabled son like she had been prior to the previous hospitalization. She denies chest pain, shortness of breath, nausea, vomiting, diarrhea, or abdominal pain.  In the ED, she was tachycardic and in atrial fibrillation with a heart rate ranging from 105 up to 128 bpm. Her EKG revealed atrial fibrillation with a heart rate of 128 bpm. Her lab data were significant for WBC of 13.7, sodium of 135, negative troponin I, INR 3.04, and normal CK of 136. Her urinalysis revealed 7-10 daily CBCs  and many bacteria. Her urine drug screen was positive for opiates. Radiographic studies revealed no acute fractures of her right shoulder, cervical spine, or skull. There were severe glenohumeral joint degenerative changes of her right shoulder. She is being admitted for observation and requested placement and an assisted-living facility that can both accommodate the patient and her disabled son.     Review of Systems:  As above in history present illness. In addition, she has mild chronic swelling in her legs; arthritic pain in her shoulders and her lower back; chronic shortness of breath; chronic right-sided weakness from previous stroke; otherwise review systems is negative.  Past Medical History  Diagnosis Date  . Hyperlipidemia   . Osteoporosis   . Hypertension     with severe left ventricular hypertrophy; normal ejection fraction in 04/2005  . Valvular heart disease     Mild to moderate aortic stenosis; moderate to severe mitral stenosis in 2007; mild MR  . Paroxysmal atrial fibrillation     Remote  . Diverticulosis     Pancolonic; h/o LGI bleeding and diverticulitis  . Degenerative joint disease     Knees  . Chronic kidney disease     Mild; creatinine of 1.19-1.28 in recent years  . Anemia, iron deficiency   . Popliteal cyst     Left  . Fracture of wrist 2008    left  . Gout   . Stroke right side deficit  . NSTEMI, initial episode of care 01/05/2012  . Chronic combined systolic and diastolic congestive heart failure 02/2013    EF 30%; Grade 2 diastolic dys  Past Surgical History  Procedure Laterality Date  . Appendectomy  1944  . Total hip arthroplasty  1994   Social History: She reports that she quit smoking about 41 years ago. Her smoking use included Cigarettes. She smoked 0.00 packs per day. She has never used smokeless tobacco. She reports that she does not drink alcohol or use illicit drugs. She lives at home with her disabled son. Family members are close by to  help.  No Known Allergies  Family History  Problem Relation Age of Onset  . Diabetes Mother      Prior to Admission medications   Medication Sig Start Date End Date Taking? Authorizing Provider  alendronate (FOSAMAX) 70 MG tablet TAKE 1 TAB EACH WEEK 30 MIN PRIOR TO BREAKFAST WITH LARGE GLASS OF WATER. REMAIN UPRIGHT. 12/06/13  Yes Kerri PerchesMargaret E Simpson, MD  allopurinol (ZYLOPRIM) 100 MG tablet TAKE ONE TABLET BY MOUTH ONCE DAILY. 10/28/13  Yes Kerri PerchesMargaret E Simpson, MD  ASPIRIN LOW DOSE 81 MG EC tablet TAKE ONE TABLET BY MOUTH ONCE DAILY. 10/28/13  Yes Kerri PerchesMargaret E Simpson, MD  benzonatate (TESSALON) 100 MG capsule Take 1 capsule (100 mg total) by mouth 3 (three) times daily as needed for cough. 02/05/14  Yes Lesle ChrisKaren M Black, NP  Calcium Carb-Cholecalciferol 500-400 MG-UNIT TABS TAKE (1) TABLET BY MOUTH (3) TIMES DAILY. 07/24/13  Yes Kerri PerchesMargaret E Simpson, MD  COLACE 50 MG capsule TAKE (1) CAPSULE BY MOUTH TWICE DAILY. 10/28/13  Yes Kerri PerchesMargaret E Simpson, MD  feeding supplement, ENSURE COMPLETE, (ENSURE COMPLETE) LIQD Take 237 mLs by mouth 2 (two) times daily between meals. 02/05/14  Yes Lesle ChrisKaren M Black, NP  fluticasone (FLONASE) 50 MCG/ACT nasal spray USE 1 SPRAY IN EACH NOSTRIL ONCE DAILY. 07/23/13  Yes Kerri PerchesMargaret E Simpson, MD  furosemide (LASIX) 40 MG tablet TAKE ONE TABLET BY MOUTH ONCE DAILY. 12/24/13  Yes Laqueta LindenSuresh A Koneswaran, MD  HYDROcodone-acetaminophen (NORCO/VICODIN) 5-325 MG per tablet Take 1 tablet by mouth every 6 (six) hours as needed (pain).  01/21/14  Yes Historical Provider, MD  menthol-cetylpyridinium (CEPACOL) 3 MG lozenge Take 1 lozenge (3 mg total) by mouth as needed for sore throat. 02/05/14  Yes Lesle ChrisKaren M Black, NP  metoprolol (LOPRESSOR) 50 MG tablet Take 50-75 mg by mouth 2 (two) times daily. 75 mg each morning and 50 mg at bedtime.   Yes Historical Provider, MD  polyethylene glycol powder (GLYCOLAX/MIRALAX) powder Take 17 g by mouth daily as needed (for constipation).   Yes Historical Provider, MD   potassium chloride (K-DUR) 10 MEQ tablet TAKE 1 TABLET BY MOUTH ON MONDAY,WEDNESDAY AND FRIDAY. 10/28/13  Yes Kerri PerchesMargaret E Simpson, MD  simvastatin (ZOCOR) 20 MG tablet TAKE (1) TABLET BY MOUTH AT BEDTIME FOR CHOLESTEROL. 10/28/13  Yes Kerri PerchesMargaret E Simpson, MD  valsartan (DIOVAN) 80 MG tablet Take 1 tablet by mouth daily. 01/28/14  Yes Historical Provider, MD  warfarin (COUMADIN) 1 MG tablet Take 3.5-5 mg by mouth daily. Takes with 2.5mg  daily for a total of 3.5mg  daily except on Mondays patient takes 5mg (2 of the 2.5mg )   Yes Historical Provider, MD  warfarin (COUMADIN) 2.5 MG tablet Take 3.5-5 mg by mouth daily. Patient takes with 1mg  for a total of 3.5mg  except on Monday patient takes 5mg (2 of the 2.5mg )   Yes Historical Provider, MD  Wheat Dextrin (BENEFIBER PO) Take 1 each by mouth daily.   Yes Historical Provider, MD  cefUROXime (CEFTIN) 250 MG tablet Take 1 tablet (250 mg total) by mouth 2 (two) times daily  with a meal. Patient not taking: Reported on 02/11/2014 02/05/14   Gwenyth Bender, NP  warfarin (COUMADIN) 1 MG tablet TAKE 1 TABLET BY MOUTH ONCE A DAY,ALONG WITH 2.5 MG DAILY TO EQUAL 3.5 MG. Patient not taking: Reported on 02/11/2014 10/28/13   Jodelle Gross, NP  warfarin (COUMADIN) 2.5 MG tablet TAKE 1 TABLET BY MOUTH ONCE A DAY,ALONG WITH 1 MG TO EQUAL 3.5 MG DAILY. Patient not taking: Reported on 02/11/2014 12/23/13   Laqueta Linden, MD   Physical Exam: Filed Vitals:   02/11/14 1411 02/11/14 1435 02/11/14 1530 02/11/14 1622  BP: 146/94  167/93 154/123  Pulse: 108  111 112  Temp:  98.1 F (36.7 C)  99.3 F (37.4 C)  TempSrc:    Oral  Resp: 20  25 20   Height:    5\' 6"  (1.676 m)  Weight:    63.2 kg (139 lb 5.3 oz)  SpO2: 96%  97% 100%    Wt Readings from Last 3 Encounters:  02/11/14 63.2 kg (139 lb 5.3 oz)  02/05/14 63.957 kg (141 lb)  01/30/14 65.772 kg (145 lb)    General:  Appears calm and comfortable; elderly pleasant 80 year old African-American woman who is frail,  but appears to be in no acute distress. Eyes: PERRL, normal lids, irises & conjunctiva ENT: oropharynx reveals missing teeth; mucous membranes mildly dry. Neck: no LAD, masses or thyromegaly; mild tenderness over the posterior and right cervical muscles. Cardiovascular: irregular, irregular, with mild tachycardia and 2/6 systolic murmur. Telemetry: atrial fibrillation  Respiratory: CTA bilaterally. Breathing nonlabored. Abdomen: positive bowel sounds, soft, nontender, nondistended.  Skin: no rash or induration seen on limited exam Musculoskeletal: mild diffuse arthritic changes noted in her hands and her knees without acute effusion. She has some discomfort when she tries to flex and extend her neck. She is moderately tender over her right shoulder joint, but is not warm or edematous. She is unable to raise her right arm above her head, due to pain and previous stroke per her account. Good range of motion of her left upper extremity and left lower extremity with extension and flexion. Psychiatric: she is asleep, but became arousable and answered questions appropriately. Neurologic: she has a mild right facial droop and mild chronic dysarthria which is chronic. Strength of her right upper/lower extremity is 3+- 4 minus over 5; strength of her left upper and left lower extremity is 5 over 5.          Labs on Admission:  Basic Metabolic Panel:  Recent Labs Lab 02/05/14 0618 02/11/14 1041  NA 139 135*  K 4.0 4.2  CL 100 95*  CO2 25 27  GLUCOSE 91 105*  BUN 19 17  CREATININE 1.18* 1.03  CALCIUM 9.2 10.4   Liver Function Tests:  Recent Labs Lab 02/11/14 1041  AST 18  ALT 11  ALKPHOS 64  BILITOT 1.2  PROT 7.7  ALBUMIN 3.1*   No results for input(s): LIPASE, AMYLASE in the last 168 hours. No results for input(s): AMMONIA in the last 168 hours. CBC:  Recent Labs Lab 02/11/14 1041  WBC 13.7*  NEUTROABS 11.4*  HGB 12.2  HCT 37.1  MCV 95.1  PLT 273   Cardiac  Enzymes:  Recent Labs Lab 02/11/14 1041 02/11/14 1044  CKTOTAL  --  136  TROPONINI <0.30  --     BNP (last 3 results)  Recent Labs  03/04/13 1243  PROBNP 4971.0*   CBG: No results for input(s): GLUCAP in  the last 168 hours.  Radiological Exams on Admission: Dg Chest 1 View  02/11/2014   CLINICAL DATA:  Larey Seat 3 days ago. Weakness for 1-2 weeks. Chest and right shoulder pain.  EXAM: CHEST - 1 VIEW; RIGHT SHOULDER - 2+ VIEW  COMPARISON:  Chest x-ray 02/03/2014  FINDINGS: Chest x-ray:  The heart is mildly enlarged but stable. There is tortuosity and calcification of the thoracic aorta. Chronic lung changes but no acute pulmonary findings. The bony thorax is grossly intact.  Right shoulder:  Severe glenohumeral joint degenerative changes. No acute fracture. The visualized right lung apex is clear.  IMPRESSION: No acute cardiopulmonary findings.  Severe glenohumeral joint degenerative changes. No obvious acute fracture.   Electronically Signed   By: Loralie Champagne M.D.   On: 02/11/2014 12:01   Dg Shoulder Right  02/11/2014   CLINICAL DATA:  Larey Seat 3 days ago. Weakness for 1-2 weeks. Chest and right shoulder pain.  EXAM: CHEST - 1 VIEW; RIGHT SHOULDER - 2+ VIEW  COMPARISON:  Chest x-ray 02/03/2014  FINDINGS: Chest x-ray:  The heart is mildly enlarged but stable. There is tortuosity and calcification of the thoracic aorta. Chronic lung changes but no acute pulmonary findings. The bony thorax is grossly intact.  Right shoulder:  Severe glenohumeral joint degenerative changes. No acute fracture. The visualized right lung apex is clear.  IMPRESSION: No acute cardiopulmonary findings.  Severe glenohumeral joint degenerative changes. No obvious acute fracture.   Electronically Signed   By: Loralie Champagne M.D.   On: 02/11/2014 12:01   Ct Head Wo Contrast  02/11/2014   CLINICAL DATA:  80 year old female with weakness for 1-2 weeks. Fall from bed 3 days ago. Initial encounter.  EXAM: CT HEAD WITHOUT  CONTRAST  CT CERVICAL SPINE WITHOUT CONTRAST  TECHNIQUE: Multidetector CT imaging of the head and cervical spine was performed following the standard protocol without intravenous contrast. Multiplanar CT image reconstructions of the cervical spine were also generated.  COMPARISON:  Head CT without contrast 01/05/2012 and brain MRI from that same day.  FINDINGS: CT HEAD FINDINGS  Bubbly opacity in the right maxillary sinus. The right orbital floor appears stable. Postoperative changes to both globes with otherwise negative orbits soft tissues. Other Visualized paranasal sinuses and mastoids are clear.  No scalp hematoma identified.  Calvarium intact.  Calcified atherosclerosis at the skull base. Mild further generalized cerebral volume loss since 2013. No ventriculomegaly. No midline shift, mass effect, or evidence of intracranial mass lesion. Chronic patchy white matter hypodensity has mildly progressed in both hemispheres. No evidence of cortically based acute infarction identified. No acute intracranial hemorrhage identified. No suspicious intracranial vascular hyperdensity.  CT CERVICAL SPINE FINDINGS  Study is intermittently degraded by motion artifact despite repeated imaging attempts.  Visualized skull base is intact. No atlanto-occipital dissociation. Trace anterolisthesis at C7-T1 with associated moderate severe facet hypertrophy. 2-3 mm of anterolisthesis of C5 on C6 with moderate to severe right side facet hypertrophy. 2 mm of anterolisthesis of C4 on C5 with moderate to severe facet hypertrophy greater on the left. C1-C2 alignment preserved, severe left side C1-C2 joint space loss with subchondral sclerosis and cysts. Odontoid appears intact. No acute cervical spine fracture identified.  Widespread degenerative cervical spinal stenosis is suboptimally evaluated due to motion artifact, but appears to be moderate to severe it C3-C4.  Grossly intact visualized upper thoracic levels. Negative visible lung  apices. Calcified atherosclerosis in the neck. Otherwise negative noncontrast paraspinal soft tissues.  IMPRESSION: 1. No acute intracranial abnormality.  Chronic cerebral volume loss and white matter disease. 2. No acute fracture or listhesis identified in the cervical spine. Ligamentous injury is not excluded. 3. Widespread cervical spine degeneration with multilevel spondylolisthesis and moderate to severe degenerative spinal stenosis at C3-C4. 4. Bubbly opacity in the right maxillary sinus, favor inflammatory such as due to acute sinusitis.   Electronically Signed   By: Augusto Gamble M.D.   On: 02/11/2014 12:02   Ct Cervical Spine Wo Contrast  02/11/2014   CLINICAL DATA:  80 year old female with weakness for 1-2 weeks. Fall from bed 3 days ago. Initial encounter.  EXAM: CT HEAD WITHOUT CONTRAST  CT CERVICAL SPINE WITHOUT CONTRAST  TECHNIQUE: Multidetector CT imaging of the head and cervical spine was performed following the standard protocol without intravenous contrast. Multiplanar CT image reconstructions of the cervical spine were also generated.  COMPARISON:  Head CT without contrast 01/05/2012 and brain MRI from that same day.  FINDINGS: CT HEAD FINDINGS  Bubbly opacity in the right maxillary sinus. The right orbital floor appears stable. Postoperative changes to both globes with otherwise negative orbits soft tissues. Other Visualized paranasal sinuses and mastoids are clear.  No scalp hematoma identified.  Calvarium intact.  Calcified atherosclerosis at the skull base. Mild further generalized cerebral volume loss since 2013. No ventriculomegaly. No midline shift, mass effect, or evidence of intracranial mass lesion. Chronic patchy white matter hypodensity has mildly progressed in both hemispheres. No evidence of cortically based acute infarction identified. No acute intracranial hemorrhage identified. No suspicious intracranial vascular hyperdensity.  CT CERVICAL SPINE FINDINGS  Study is intermittently  degraded by motion artifact despite repeated imaging attempts.  Visualized skull base is intact. No atlanto-occipital dissociation. Trace anterolisthesis at C7-T1 with associated moderate severe facet hypertrophy. 2-3 mm of anterolisthesis of C5 on C6 with moderate to severe right side facet hypertrophy. 2 mm of anterolisthesis of C4 on C5 with moderate to severe facet hypertrophy greater on the left. C1-C2 alignment preserved, severe left side C1-C2 joint space loss with subchondral sclerosis and cysts. Odontoid appears intact. No acute cervical spine fracture identified.  Widespread degenerative cervical spinal stenosis is suboptimally evaluated due to motion artifact, but appears to be moderate to severe it C3-C4.  Grossly intact visualized upper thoracic levels. Negative visible lung apices. Calcified atherosclerosis in the neck. Otherwise negative noncontrast paraspinal soft tissues.  IMPRESSION: 1. No acute intracranial abnormality. Chronic cerebral volume loss and white matter disease. 2. No acute fracture or listhesis identified in the cervical spine. Ligamentous injury is not excluded. 3. Widespread cervical spine degeneration with multilevel spondylolisthesis and moderate to severe degenerative spinal stenosis at C3-C4. 4. Bubbly opacity in the right maxillary sinus, favor inflammatory such as due to acute sinusitis.   Electronically Signed   By: Augusto Gamble M.D.   On: 02/11/2014 12:02    EKG: Independently reviewed. Atrial fibrillation, heart rate 1 28 bpm.  Assessment/Plan Principal Problem:   Weakness generalized Active Problems:   Failure to thrive (0-17)   UTI (urinary tract infection)   Fall in home   Osteoarthritis of right shoulder   Essential hypertension   Paroxysmal atrial fibrillation   Chronic kidney disease   Hemiplegia, unspecified, affecting dominant side   Long term current use of anticoagulant therapy   Chronic combined systolic and diastolic congestive heart  failure   1. Generalized weakness/failure to thrive and in the setting of a recent nontraumatic fall at home. The patient is wheelchair-bound, but had been managing relatively well with some assistance  from family and home health services. She had also been taking care of her disabled son. I believe she is failing to thrive because of senile decline from her age and  comorbid conditions. Her children have left, but I discussed the patient briefly with her nephew, Lovie MacadamiaKenney (patient advocate at [facility]). He expressed that the family had been trying to arrange for an assisted living facility that could accommodate both the patient and her disabled son. The social workers had already discussed this with the family in the ED. The patient is being admitted for observation to treat her pain and urinary tract infection and to try to assist the family with acquiring placement. 2. Severe osteoarthritis of the right shoulder/shoulder pain and severe degenerative changes of cervical spine. We'll treat her pain with scheduled 3 times a day Tylenol. We'll provide when necessary oxycodone and when necessary IV morphine. Will add topical Voltaren gel for the right shoulder. 3. History of stroke with chronic right-sided hemiparesis. We'll continue Coumadin per pharmacy cautiously given recent fall. Will discontinue aspirin. 4. Paroxysmal atrial fibrillation with mild RVR. We'll restart metoprolol. Will give 1 dose of Cardizem IV 1. Will start oral diltiazem at 30 mg every 6 hours-the dose can be adjusted and/or discontinued if needed. Will restart Coumadin per pharmacy, cautiously in the setting of a nontraumatic fall. 5. Malignant hypertension. We'll continue metoprolol and ARB. As above, every 6 hours dosing of diltiazem started. 6. Chronic combined systolic and chronic diastolic heart failure. Currently stable and compensated. Continue beta blocker, Lasix, ARB. 7. Stage II chronic kidney disease. Currently stable. Giving very  gentle IV fluids at 50 cc an hour 24 hours. 8. Urinary tract infection. The patient was discharged home on Ceftin last week. She is not sure if she's been taking it. The urine culture was pending at the time of discharge, but per review, it revealed Escherichia coli, sensitive to Rocephin. Will restart Rocephin and reculture urine.      Code Status: full code per my discussion with the patient. DVT Prophylaxis:on Coumadin. Family Communication: discussed briefly with Bernette RedbirdKenny; children and grandchildren not available currently. Disposition Plan: discharge when clinically appropriate; tentative plan is for discharge to assisted living facility if possible.  Time spent: one hour.  [facility],Whitlee Sluder [facility] Pager [phone]

## 2014-02-11 NOTE — ED Provider Notes (Signed)
CSN: [id number]     Arrival date & time 02/11/14  4098 History  This chart was scribed for Joya Gaskins, MD by Tonye Royalty, ED Scribe. This patient was seen in room APA04/APA04 and the patient's care was started at 10:20 AM.    Chief Complaint  Patient presents with  . Jaw Pain   Patient is a 80 y.o. female presenting with weakness and fall. The history is provided by the patient and a relative. No language interpreter was used.  Weakness This is a new problem. The current episode started more than 1 week ago. The problem occurs constantly. The problem has not changed since onset.Associated symptoms include headaches. Pertinent negatives include no chest pain and no abdominal pain. Nothing aggravates the symptoms. Nothing relieves the symptoms. The treatment provided no relief.  Fall This is a new problem. The current episode started more than 2 days ago. Episode frequency: once. The problem has not changed since onset.Associated symptoms include headaches. Pertinent negatives include no chest pain and no abdominal pain. Nothing aggravates the symptoms. Nothing relieves the symptoms. She has tried nothing for the symptoms.    HPI Comments: WALESKA BUTTERY is a 80 y.o. female who presents to the Emergency Department complaining of generalized weakness with onset 1.5 weeks ago and pain to her left jaw and shoulder after falling 3 days ago. She reports associated headache and neck pain. She was evaluated here last week for weakness but has not improved. Per nephew, she fell 3 days ago and could not get up, after which she called EMS using [facility] but refused transportation to the ED. She denies striking her head. Per nephew, she was hospitalized for 2 days 2-3 weeks ago for a cold and "has not been herself" since then. He states she is normally much more active. She denies chest pain, abdominal pain, leg pain, vomiting, or diarrhea.  Past Medical History  Diagnosis Date  . Hyperlipidemia    . Osteoporosis   . Hypertension     with severe left ventricular hypertrophy; normal ejection fraction in 04/2005  . Valvular heart disease     Mild to moderate aortic stenosis; moderate to severe mitral stenosis in 2007; mild MR  . Paroxysmal atrial fibrillation     Remote  . Diverticulosis     Pancolonic; h/o LGI bleeding and diverticulitis  . Degenerative joint disease     Knees  . Chronic kidney disease     Mild; creatinine of 1.19-1.28 in recent years  . Anemia, iron deficiency   . Popliteal cyst     Left  . Fracture of wrist 2008    left  . Gout   . CHF (congestive heart failure)   . Stroke right side deficit   Past Surgical History  Procedure Laterality Date  . Appendectomy  1944  . Total hip arthroplasty  1994   Family History  Problem Relation Age of Onset  . Diabetes Mother    History  Substance Use Topics  . Smoking status: Former Smoker    Types: Cigarettes    Quit date: 08/31/1972  . Smokeless tobacco: Never Used  . Alcohol Use: No   OB History    No data available     Review of Systems  Cardiovascular: Negative for chest pain.  Gastrointestinal: Negative for nausea, vomiting, abdominal pain and diarrhea.  Musculoskeletal: Positive for neck pain.       Left jaw pain, left shoulder pain  Neurological: Positive for weakness and headaches.  All other systems reviewed and are negative.     Allergies  Review of patient's allergies indicates no known allergies.  Home Medications   Prior to Admission medications   Medication Sig Start Date End Date Taking? Authorizing Provider  alendronate (FOSAMAX) 70 MG tablet TAKE 1 TAB EACH WEEK 30 MIN PRIOR TO BREAKFAST WITH LARGE GLASS OF WATER. REMAIN UPRIGHT. 12/06/13   Kerri PerchesMargaret E Simpson, MD  allopurinol (ZYLOPRIM) 100 MG tablet TAKE ONE TABLET BY MOUTH ONCE DAILY. 10/28/13   Kerri PerchesMargaret E Simpson, MD  ASPIRIN LOW DOSE 81 MG EC tablet TAKE ONE TABLET BY MOUTH ONCE DAILY. 10/28/13   Kerri PerchesMargaret E Simpson, MD   benzonatate (TESSALON) 100 MG capsule Take 1 capsule (100 mg total) by mouth 3 (three) times daily as needed for cough. 02/05/14   Gwenyth BenderKaren M Black, NP  Calcium Carb-Cholecalciferol 500-400 MG-UNIT TABS TAKE (1) TABLET BY MOUTH (3) TIMES DAILY. 07/24/13   Kerri PerchesMargaret E Simpson, MD  cefUROXime (CEFTIN) 250 MG tablet Take 1 tablet (250 mg total) by mouth 2 (two) times daily with a meal. 02/05/14   Gwenyth BenderKaren M Black, NP  COLACE 50 MG capsule TAKE (1) CAPSULE BY MOUTH TWICE DAILY. 10/28/13   Kerri PerchesMargaret E Simpson, MD  feeding supplement, ENSURE COMPLETE, (ENSURE COMPLETE) LIQD Take 237 mLs by mouth 2 (two) times daily between meals. 02/05/14   Gwenyth BenderKaren M Black, NP  fluticasone (FLONASE) 50 MCG/ACT nasal spray USE 1 SPRAY IN EACH NOSTRIL ONCE DAILY. 07/23/13   Kerri PerchesMargaret E Simpson, MD  furosemide (LASIX) 40 MG tablet TAKE ONE TABLET BY MOUTH ONCE DAILY. 12/24/13   Laqueta LindenSuresh A Koneswaran, MD  HYDROcodone-acetaminophen (NORCO/VICODIN) 5-325 MG per tablet Take 1 tablet by mouth as needed.  01/21/14   Historical Provider, MD  menthol-cetylpyridinium (CEPACOL) 3 MG lozenge Take 1 lozenge (3 mg total) by mouth as needed for sore throat. 02/05/14   Gwenyth BenderKaren M Black, NP  metoprolol (LOPRESSOR) 50 MG tablet Take 50-75 mg by mouth 2 (two) times daily. 75 mg each morning and 50 mg at bedtime.    Historical Provider, MD  polyethylene glycol powder (GLYCOLAX/MIRALAX) powder Take 17 g by mouth daily as needed (for constipation).    Historical Provider, MD  potassium chloride (K-DUR) 10 MEQ tablet TAKE 1 TABLET BY MOUTH ON MONDAY,WEDNESDAY AND FRIDAY. 10/28/13   Kerri PerchesMargaret E Simpson, MD  simvastatin (ZOCOR) 20 MG tablet TAKE (1) TABLET BY MOUTH AT BEDTIME FOR CHOLESTEROL. 10/28/13   Kerri PerchesMargaret E Simpson, MD  valsartan (DIOVAN) 80 MG tablet Take 1 tablet by mouth daily. 01/28/14   Historical Provider, MD  warfarin (COUMADIN) 1 MG tablet TAKE 1 TABLET BY MOUTH ONCE A DAY,ALONG WITH 2.5 MG DAILY TO EQUAL 3.5 MG. 10/28/13   Jodelle GrossKathryn M Lawrence, NP  warfarin  (COUMADIN) 2.5 MG tablet TAKE 1 TABLET BY MOUTH ONCE A DAY,ALONG WITH 1 MG TO EQUAL 3.5 MG DAILY. 12/23/13   Laqueta LindenSuresh A Koneswaran, MD  Wheat Dextrin (BENEFIBER PO) Take 1 each by mouth daily.    Historical Provider, MD   BP 176/105 mmHg  Pulse 123  Temp(Src) 98.1 F (36.7 C) (Oral)  Resp 18  Ht 5\' 6"  (1.676 m)  Wt 155 lb (70.308 kg)  BMI 25.03 kg/m2  SpO2 97% Physical Exam  Nursing note and vitals reviewed.  CONSTITUTIONAL: elderly, frail HEAD: Normocephalic/atraumatic EYES: EOMI ENMT: Mucous membranes moist, poor dentition, no signs of trauma SPINE/BACK: C-spine tenderness, no thoracic or lumbar tenderness, Patient maintained in spinal precautions/logroll utilized, No bruising/crepitance/stepoffs noted to spine CV:  irregular and systolic murmur noted LUNGS: Lungs are clear to auscultation bilaterally, no apparent distress ABDOMEN: soft, nontender, no rebound or guarding, bowel sounds noted throughout abdomen GU:no cva tenderness NEURO: Pt is awake/alert/appropriate, moves all extremitiesx4.  No facial droop.   EXTREMITIES: pulses normal/equal, full ROM, tender to palpation in left shoulder SKIN: warm, color normal PSYCH: no abnormalities of mood noted, alert and oriented to situation  ED Course  Procedures   DIAGNOSTIC STUDIES: Oxygen Saturation is 97% on room air, normal by my interpretation.    COORDINATION OF CARE: 10:30 AM Discussed treatment plan with patient at beside, the patient agrees with the plan and has no further questions at this time.   2:51 PM Pt still with HA and neck pain - negative CT imaging Family reports increasing fatigue and difficulty moving around at home She also has another UTI She is still in afib with rate up to 120 She will be admitted for pain control, treatment of UTI  Family is working on nursing home placement for patient and her son (son is handicapped and patient cares for him at home   Labs Review Labs Reviewed  PROTIME-INR -  Abnormal; Notable for the following:    Prothrombin Time 31.7 (*)    INR 3.04 (*)    All other components within normal limits  APTT - Abnormal; Notable for the following:    aPTT 43 (*)    All other components within normal limits  CBC - Abnormal; Notable for the following:    WBC 13.7 (*)    All other components within normal limits  DIFFERENTIAL - Abnormal; Notable for the following:    Neutrophils Relative % 83 (*)    Neutro Abs 11.4 (*)    Lymphocytes Relative 6 (*)    Monocytes Absolute 1.4 (*)    All other components within normal limits  COMPREHENSIVE METABOLIC PANEL - Abnormal; Notable for the following:    Sodium 135 (*)    Chloride 95 (*)    Glucose, Bld 105 (*)    Albumin 3.1 (*)    GFR calc non Af Amer 45 (*)    GFR calc Af Amer 52 (*)    All other components within normal limits  URINE RAPID DRUG SCREEN (HOSP PERFORMED) - Abnormal; Notable for the following:    Opiates POSITIVE (*)    All other components within normal limits  URINALYSIS, ROUTINE W REFLEX MICROSCOPIC - Abnormal; Notable for the following:    APPearance HAZY (*)    Hgb urine dipstick MODERATE (*)    Protein, ur 100 (*)    Leukocytes, UA TRACE (*)    All other components within normal limits  URINE MICROSCOPIC-ADD ON - Abnormal; Notable for the following:    Squamous Epithelial / LPF FEW (*)    Bacteria, UA MANY (*)    All other components within normal limits  URINE CULTURE  ETHANOL  TROPONIN I  CK    Imaging Review Dg Chest 1 View  02/11/2014   CLINICAL DATA:  Larey SeatFell 3 days ago. Weakness for 1-2 weeks. Chest and right shoulder pain.  EXAM: CHEST - 1 VIEW; RIGHT SHOULDER - 2+ VIEW  COMPARISON:  Chest x-ray 02/03/2014  FINDINGS: Chest x-ray:  The heart is mildly enlarged but stable. There is tortuosity and calcification of the thoracic aorta. Chronic lung changes but no acute pulmonary findings. The bony thorax is grossly intact.  Right shoulder:  Severe glenohumeral joint degenerative changes.  No acute fracture. The  visualized right lung apex is clear.  IMPRESSION: No acute cardiopulmonary findings.  Severe glenohumeral joint degenerative changes. No obvious acute fracture.   Electronically Signed   By: Loralie Champagne M.D.   On: 02/11/2014 12:01   Dg Shoulder Right  02/11/2014   CLINICAL DATA:  Larey Seat 3 days ago. Weakness for 1-2 weeks. Chest and right shoulder pain.  EXAM: CHEST - 1 VIEW; RIGHT SHOULDER - 2+ VIEW  COMPARISON:  Chest x-ray 02/03/2014  FINDINGS: Chest x-ray:  The heart is mildly enlarged but stable. There is tortuosity and calcification of the thoracic aorta. Chronic lung changes but no acute pulmonary findings. The bony thorax is grossly intact.  Right shoulder:  Severe glenohumeral joint degenerative changes. No acute fracture. The visualized right lung apex is clear.  IMPRESSION: No acute cardiopulmonary findings.  Severe glenohumeral joint degenerative changes. No obvious acute fracture.   Electronically Signed   By: Loralie Champagne M.D.   On: 02/11/2014 12:01   Ct Head Wo Contrast  02/11/2014   CLINICAL DATA:  80 year old female with weakness for 1-2 weeks. Fall from bed 3 days ago. Initial encounter.  EXAM: CT HEAD WITHOUT CONTRAST  CT CERVICAL SPINE WITHOUT CONTRAST  TECHNIQUE: Multidetector CT imaging of the head and cervical spine was performed following the standard protocol without intravenous contrast. Multiplanar CT image reconstructions of the cervical spine were also generated.  COMPARISON:  Head CT without contrast 01/05/2012 and brain MRI from that same day.  FINDINGS: CT HEAD FINDINGS  Bubbly opacity in the right maxillary sinus. The right orbital floor appears stable. Postoperative changes to both globes with otherwise negative orbits soft tissues. Other Visualized paranasal sinuses and mastoids are clear.  No scalp hematoma identified.  Calvarium intact.  Calcified atherosclerosis at the skull base. Mild further generalized cerebral volume loss since 2013. No  ventriculomegaly. No midline shift, mass effect, or evidence of intracranial mass lesion. Chronic patchy white matter hypodensity has mildly progressed in both hemispheres. No evidence of cortically based acute infarction identified. No acute intracranial hemorrhage identified. No suspicious intracranial vascular hyperdensity.  CT CERVICAL SPINE FINDINGS  Study is intermittently degraded by motion artifact despite repeated imaging attempts.  Visualized skull base is intact. No atlanto-occipital dissociation. Trace anterolisthesis at C7-T1 with associated moderate severe facet hypertrophy. 2-3 mm of anterolisthesis of C5 on C6 with moderate to severe right side facet hypertrophy. 2 mm of anterolisthesis of C4 on C5 with moderate to severe facet hypertrophy greater on the left. C1-C2 alignment preserved, severe left side C1-C2 joint space loss with subchondral sclerosis and cysts. Odontoid appears intact. No acute cervical spine fracture identified.  Widespread degenerative cervical spinal stenosis is suboptimally evaluated due to motion artifact, but appears to be moderate to severe it C3-C4.  Grossly intact visualized upper thoracic levels. Negative visible lung apices. Calcified atherosclerosis in the neck. Otherwise negative noncontrast paraspinal soft tissues.  IMPRESSION: 1. No acute intracranial abnormality. Chronic cerebral volume loss and white matter disease. 2. No acute fracture or listhesis identified in the cervical spine. Ligamentous injury is not excluded. 3. Widespread cervical spine degeneration with multilevel spondylolisthesis and moderate to severe degenerative spinal stenosis at C3-C4. 4. Bubbly opacity in the right maxillary sinus, favor inflammatory such as due to acute sinusitis.   Electronically Signed   By: Augusto Gamble M.D.   On: 02/11/2014 12:02   Ct Cervical Spine Wo Contrast  02/11/2014   CLINICAL DATA:  80 year old female with weakness for 1-2 weeks. Fall from bed 3 days  ago. Initial  encounter.  EXAM: CT HEAD WITHOUT CONTRAST  CT CERVICAL SPINE WITHOUT CONTRAST  TECHNIQUE: Multidetector CT imaging of the head and cervical spine was performed following the standard protocol without intravenous contrast. Multiplanar CT image reconstructions of the cervical spine were also generated.  COMPARISON:  Head CT without contrast 01/05/2012 and brain MRI from that same day.  FINDINGS: CT HEAD FINDINGS  Bubbly opacity in the right maxillary sinus. The right orbital floor appears stable. Postoperative changes to both globes with otherwise negative orbits soft tissues. Other Visualized paranasal sinuses and mastoids are clear.  No scalp hematoma identified.  Calvarium intact.  Calcified atherosclerosis at the skull base. Mild further generalized cerebral volume loss since 2013. No ventriculomegaly. No midline shift, mass effect, or evidence of intracranial mass lesion. Chronic patchy white matter hypodensity has mildly progressed in both hemispheres. No evidence of cortically based acute infarction identified. No acute intracranial hemorrhage identified. No suspicious intracranial vascular hyperdensity.  CT CERVICAL SPINE FINDINGS  Study is intermittently degraded by motion artifact despite repeated imaging attempts.  Visualized skull base is intact. No atlanto-occipital dissociation. Trace anterolisthesis at C7-T1 with associated moderate severe facet hypertrophy. 2-3 mm of anterolisthesis of C5 on C6 with moderate to severe right side facet hypertrophy. 2 mm of anterolisthesis of C4 on C5 with moderate to severe facet hypertrophy greater on the left. C1-C2 alignment preserved, severe left side C1-C2 joint space loss with subchondral sclerosis and cysts. Odontoid appears intact. No acute cervical spine fracture identified.  Widespread degenerative cervical spinal stenosis is suboptimally evaluated due to motion artifact, but appears to be moderate to severe it C3-C4.  Grossly intact visualized upper thoracic  levels. Negative visible lung apices. Calcified atherosclerosis in the neck. Otherwise negative noncontrast paraspinal soft tissues.  IMPRESSION: 1. No acute intracranial abnormality. Chronic cerebral volume loss and white matter disease. 2. No acute fracture or listhesis identified in the cervical spine. Ligamentous injury is not excluded. 3. Widespread cervical spine degeneration with multilevel spondylolisthesis and moderate to severe degenerative spinal stenosis at C3-C4. 4. Bubbly opacity in the right maxillary sinus, favor inflammatory such as due to acute sinusitis.   Electronically Signed   By: Augusto Gamble M.D.   On: 02/11/2014 12:02     EKG Interpretation   Date/Time:  Tuesday February 11 2014 10:16:35 EST Ventricular Rate:  128 PR Interval:    QRS Duration: 67 QT Interval:  309 QTC Calculation: 451 R Axis:   58 Text Interpretation:  Atrial fibrillation Probable anteroseptal infarct,  old No significant change since last tracing Confirmed by Bebe Shaggy  MD,  Prabhnoor Ellenberger ([id number]) on 02/11/2014 10:43:48 AM       Medications  sodium chloride 0.9 % bolus 250 mL (not administered)  fentaNYL (SUBLIMAZE) injection 50 mcg (50 mcg Intravenous Given 02/11/14 1035)  fentaNYL (SUBLIMAZE) injection 100 mcg (100 mcg Intravenous Given 02/11/14 1334)  cefTRIAXone (ROCEPHIN) 1 g in dextrose 5 % 50 mL IVPB (0 g Intravenous Stopped 02/11/14 1528)    MDM   Final diagnoses:  Pain  Cervical strain, acute, initial encounter  Concussion, without loss of consciousness, initial encounter  Chronic atrial fibrillation  UTI (lower urinary tract infection)  Failure to thrive (0-17)    Nursing notes including past medical history and social history reviewed and considered in documentation xrays/imaging reviewed by myself and considered during evaluation Labs/vital reviewed myself and considered during evaluation Previous records reviewed and considered    I personally performed the services described in  this documentation,  which was scribed in my presence. The recorded information has been reviewed and is accurate.      Joya Gaskins, MD 02/11/14 [phone]

## 2014-02-12 DIAGNOSIS — S161XXA Strain of muscle, fascia and tendon at neck level, initial encounter: Secondary | ICD-10-CM | POA: Diagnosis not present

## 2014-02-12 LAB — URINE CULTURE
COLONY COUNT: NO GROWTH
Culture: NO GROWTH

## 2014-02-12 LAB — CBC
HCT: 34.6 % — ABNORMAL LOW (ref 36.0–46.0)
Hemoglobin: 11.3 g/dL — ABNORMAL LOW (ref 12.0–15.0)
MCH: 31.1 pg (ref 26.0–34.0)
MCHC: 32.7 g/dL (ref 30.0–36.0)
MCV: 95.3 fL (ref 78.0–100.0)
Platelets: 283 10*3/uL (ref 150–400)
RBC: 3.63 MIL/uL — ABNORMAL LOW (ref 3.87–5.11)
RDW: 13.6 % (ref 11.5–15.5)
WBC: 14.8 10*3/uL — ABNORMAL HIGH (ref 4.0–10.5)

## 2014-02-12 LAB — BASIC METABOLIC PANEL
Anion gap: 11 (ref 5–15)
BUN: 15 mg/dL (ref 6–23)
CALCIUM: 9.5 mg/dL (ref 8.4–10.5)
CO2: 26 mEq/L (ref 19–32)
Chloride: 98 mEq/L (ref 96–112)
Creatinine, Ser: 1 mg/dL (ref 0.50–1.10)
GFR calc Af Amer: 54 mL/min — ABNORMAL LOW (ref >90)
GFR calc non Af Amer: 47 mL/min — ABNORMAL LOW (ref >90)
Glucose, Bld: 115 mg/dL — ABNORMAL HIGH (ref 70–99)
Potassium: 4.4 mEq/L (ref 3.7–5.3)
Sodium: 135 mEq/L — ABNORMAL LOW (ref 137–147)

## 2014-02-12 LAB — PROTIME-INR
INR: 2.78 — AB (ref 0.00–1.49)
Prothrombin Time: 29.6 seconds — ABNORMAL HIGH (ref 11.6–15.2)

## 2014-02-12 MED ORDER — CEFUROXIME AXETIL 250 MG PO TABS: 250.0000 mg | ORAL_TABLET | Freq: Two times a day (BID) | ORAL | Status: DC

## 2014-02-12 MED ORDER — DICLOFENAC SODIUM 1 % TD GEL
2.0000 g | Freq: Three times a day (TID) | TRANSDERMAL | Status: DC
Start: 2014-02-12 — End: 2014-12-10

## 2014-02-12 MED ORDER — HYDROCODONE-ACETAMINOPHEN 5-325 MG PO TABS: 1.0000 | ORAL_TABLET | Freq: Four times a day (QID) | ORAL | Status: DC | PRN

## 2014-02-12 MED ORDER — TUBERCULIN PPD 5 UNIT/0.1ML ID SOLN
5.0000 [IU] | Freq: Once | INTRADERMAL | Status: DC
  Administered 2014-02-12: 5 [IU] via INTRADERMAL
  Filled 2014-02-12: qty 0.1

## 2014-02-12 MED ORDER — ACETAMINOPHEN 500 MG PO TABS: 500.0000 mg | ORAL_TABLET | Freq: Three times a day (TID) | ORAL | Status: DC

## 2014-02-12 MED ORDER — WARFARIN SODIUM 2.5 MG PO TABS: 3.5000 mg | ORAL_TABLET | Freq: Once | ORAL | Status: DC

## 2014-02-12 MED ORDER — CIPROFLOXACIN HCL 250 MG PO TABS: 250.0000 mg | ORAL_TABLET | Freq: Two times a day (BID) | ORAL | Status: DC

## 2014-02-12 NOTE — Plan of Care (Signed)
Problem: Phase I Progression Outcomes Goal: Voiding-avoid urinary catheter unless indicated Outcome: Completed/Met Date Met:  02/12/14     

## 2014-02-12 NOTE — Progress Notes (Signed)
Patient has a previous order for a [place] cervical collar. Indication not clear. Contacted hospitalist who says it is okay to remove the cervical collar.

## 2014-02-12 NOTE — Progress Notes (Signed)
INITIAL NUTRITION ASSESSMENT  DOCUMENTATION CODES Per approved criteria  -Severe malnutrition in the context of acute illness or injury  Pt meets criteria for severe MALNUTRITION in the context of acute illness as evidenced by </= 50% energy intake for >/= 5 days and >2% weight loss in 1 week.  INTERVENTION: Ensure Complete po BID, each supplement provides 350 kcal and 13 grams of protein   NUTRITION DIAGNOSIS: Inadequate oral intake related to jaw pain, decreased appetite as evidenced by unplanned  weight loss of 3% in 7 days and  6% in < 30 days which is severe.  Goal: Pt to meet >/= 90% of their estimated nutrition needs    Monitor:  Po intake, labs and wt trends   Reason for Assessment: Malnutrition Screen  80 y.o. female  Admitting Dx: Weakness generalized  ASSESSMENT: Pt is a 80 y.o. female with a history of chronic systolic and diastolic heart failure with an ejection fraction of 30% and grade 2 diastolic dysfunction per echo in the summer 2014; chronic atrial fibrillation-on Coumadin, stage II chronic kidney disease, and hypertension. She was recently hospitalized and discharged on 02/05/14 for treatment of urinary tract infection and atrial fibrillation with rapid ventricular response.   She presents with generalized weakness and Failure to Thrive. She has acute weight loss of  3% in 7 days and  6% in < 30 days which is severe. Pt is going to be discharged To SNF today. She meets criteria for severe malnutrition.Decreased oral intake since recent illness. She has moderate wasting to clavicles and mild wasting to temporal region.  Height: Ht Readings from Last 1 Encounters:  02/12/14 5\' 6"  (1.676 m)    Weight: Wt Readings from Last 1 Encounters:  02/12/14 136 lb 11 oz (62 kg)    Ideal Body Weight: 135#  % Ideal Body Weight: 101%  Wt Readings from Last 10 Encounters:  02/12/14 136 lb 11 oz (62 kg)  02/05/14 141 lb (63.957 kg)  01/30/14 145 lb (65.772 kg)   10/24/13 150 lb (68.04 kg)  06/20/13 139 lb 1.9 oz (63.104 kg)  03/07/13 144 lb 14.4 oz (65.726 kg)  03/04/13 139 lb (63.05 kg)  02/26/13 140 lb (63.504 kg)  02/06/13 139 lb (63.05 kg)  01/31/13 139 lb 6.4 oz (63.231 kg)    Usual Body Weight: 140-145#  % Usual Body Weight: 97%  BMI:  Body mass index is 22.07 kg/(m^2). normal range  Estimated Nutritional Needs: Kcal: 1300-1500  Protein: 74-85 gr Fluid: >1500 ml daily  Skin: intact  Diet Order: DIET DYS 3  EDUCATION NEEDS: -No education needs identified at this time   Intake/Output Summary (Last 24 hours) at 02/12/14 1019 Last data filed at 02/12/14 0926  Gross per 24 hour  Intake 1336.67 ml  Output      0 ml  Net 1336.67 ml    Last BM: 11/16  Labs:   Recent Labs Lab 02/11/14 1041 02/12/14 0623  NA 135* 135*  K 4.2 4.4  CL 95* 98  CO2 27 26  BUN 17 15  CREATININE 1.03 1.00  CALCIUM 10.4 9.5  GLUCOSE 105* 115*    CBG (last 3)  No results for input(s): GLUCAP in the last 72 hours.  Scheduled Meds: . acetaminophen  500 mg Oral 3 times per day  . allopurinol  100 mg Oral Daily  . cefTRIAXone (ROCEPHIN)  IV  1 g Intravenous Q24H  . diclofenac sodium  2 g Topical TID  . diltiazem  30  mg Oral 4 times per day  . feeding supplement (ENSURE COMPLETE)  237 mL Oral BID BM  . fluticasone  1 spray Each Nare Daily  . furosemide  40 mg Oral Daily  . irbesartan  75 mg Oral Daily  . metoprolol  50 mg Oral QHS  . metoprolol tartrate  75 mg Oral Daily  . potassium chloride  10 mEq Oral QODAY  . senna  1 tablet Oral BID  . tuberculin  5 Units Intradermal Once  . Warfarin - Pharmacist Dosing Inpatient   Does not apply Q24H    Continuous Infusions: . sodium chloride 50 mL/hr at 02/11/14 1830    Past Medical History  Diagnosis Date  . Hyperlipidemia   . Osteoporosis   . Hypertension     with severe left ventricular hypertrophy; normal ejection fraction in 04/2005  . Valvular heart disease     Mild to  moderate aortic stenosis; moderate to severe mitral stenosis in 2007; mild MR  . Paroxysmal atrial fibrillation     Remote  . Diverticulosis     Pancolonic; h/o LGI bleeding and diverticulitis  . Degenerative joint disease     Knees  . Chronic kidney disease     Mild; creatinine of 1.19-1.28 in recent years  . Anemia, iron deficiency   . Popliteal cyst     Left  . Fracture of wrist 2008    left  . Gout   . Stroke right side deficit  . NSTEMI, initial episode of care 01/05/2012  . Chronic combined systolic and diastolic congestive heart failure 02/2013    EF 30%; Grade 2 diastolic dys    Past Surgical History  Procedure Laterality Date  . Appendectomy  1944  . Total hip arthroplasty  1994    Judy ShiversLynn Desirie Minteer MS,RD,CSG,LDN Office: #[phone] Pager: [phone]

## 2014-02-12 NOTE — Progress Notes (Signed)
Per central telemetry, patient's heart rate has been dropping into 40's. Patient is asymptomatic.  Notified MD.  Will continue to monitor patient.

## 2014-02-12 NOTE — Progress Notes (Signed)
TB skin test administered per MD order on 02/12/2014 at 10:50 into patient's right lateral forearm.  Lot number [id number].  Site marked.  Will need to be rechecked on 02/14/2014 after 10:50.  Patient tolerated well.  Will continue to monitor site.

## 2014-02-12 NOTE — Evaluation (Signed)
Physical Therapy Evaluation Patient Details Name: Judy Grant MRN: [id number] DOB: 12-09-1917 Today's Date: 02/12/2014   History of Present Illness  80 y.o. female with a history of chronic systolic and diastolic heart failure, chronic atrial fibrillation-on Coumadin, stage II  chronic kidney disease, and hypertension. She was recently hospitalized and discharged on 02/05/14 for treatment of urinary tract infection and atrial fibrillation with rapid ventricular response. The history is being provided by both the patient and ED records. She was doing well up until a few days following discharge, when she apparently fell on the floor as she was trying to get up to go to the bedside commode. She fell on her right side. She says that she slid on the floor between her bed and the bedside commode. She complains of right shoulder pain. She denies losing consciousness. She denies a headache or head trauma. Since the fall, the family reported that she had not been herself. She is chronically wheelchair-bound, but got around in her wheelchair at home without too much difficulty up until recently. She has been more generally weak and unable to provide for herself and her disabled son like she had been prior to the previous hospitalization.   Clinical Impression  Pt presents with significant dependencies in mobility affecting her Independence. Pt lives with her handicapped son and has an aide during the day. Pt reported she does not ambulate other than take a few steps into the bathroom. Pt is limited by neck/cervial pain, generalized weakness and decreased cognition. Pt refused to transfer out of bed. Pt was min/mod assist with bed mobility and mod assist to laterally scoot over in the bed. Pt had voided on herself and was unaware. Pt returned supine in bed with nursing to assist with self-care. I do not recommend d/c home. Pt needs 24 hour assistance with mobility and care. Recommend d/c to SNF. Pt would  benefit from continued acute PT to maximize mobility and Independence for next venue of care.    Follow Up Recommendations SNF    Equipment Recommendations  None recommended by PT    Recommendations for Other Services       Precautions / Restrictions Precautions Precautions: Fall Precaution Comments: right sided neck pain, has a cervical brace, but per nursing she does not need to wear it. x-rays neg for cervical fxs. Restrictions Weight Bearing Restrictions: No      Mobility  Bed Mobility Overal bed mobility: Needs Assistance Bed Mobility: Sit to Supine;Supine to Sit     Supine to sit: Min assist;HOB elevated Sit to supine: Mod assist   General bed mobility comments: verbal and tactile cues for movement initiation, trunk support and assistance moving LEs.  Transfers Overall transfer level:  (pt refused)                  Ambulation/Gait                Stairs            Wheelchair Mobility    Modified Rankin (Stroke Patients Only)       Balance Overall balance assessment: Needs assistance Sitting-balance support: Bilateral upper extremity supported;Feet supported Sitting balance-Leahy Scale: Poor Sitting balance - Comments: supervision/min assist for static sitting. Postural control: Posterior lean;Right lateral lean                                   Pertinent Vitals/Pain Pain Assessment:  Faces Pain Score: 6  Pain Location: right shoulder and neck Pain Descriptors / Indicators: Discomfort Pain Intervention(s): Limited activity within patient's tolerance;Premedicated before session;Repositioned    Home Living Family/patient expects to be discharged to:: Private residence Living Arrangements: Children Available Help at Discharge: Personal care attendant Type of Home: Mobile home Home Access: Ramped entrance     Home Layout: One level Home Equipment: Tub bench;Wheelchair - power;Walker - 2 wheels Additional Comments:  gets around the house in her w/c, only walks a few steps into the bathroom holding onto the counter.    Prior Function Level of Independence: Needs assistance   Gait / Transfers Assistance Needed: Per pt, she does not walk, otherthan a few steps into the bathroom           Hand Dominance        Extremity/Trunk Assessment   Upper Extremity Assessment: Defer to OT evaluation           Lower Extremity Assessment: RLE deficits/detail;LLE deficits/detail RLE Deficits / Details: unable to fully assess due to pain and cognition, appears grossly 2/5 LLE Deficits / Details: unable to fully assess due to pain and cognition, appears grossly 2/5  Cervical / Trunk Assessment: Other exceptions  Communication   Communication: Other (comment);No difficulties  Cognition Arousal/Alertness: Awake/alert Behavior During Therapy: Flat affect Overall Cognitive Status: No family/caregiver present to determine baseline cognitive functioning (decreased problem solving/processing)                      General Comments General comments (skin integrity, edema, etc.): coughing noted during sitting EOB, RA in hands and joint pain limiting movement.    Exercises        Assessment/Plan    PT Assessment Patient needs continued PT services  PT Diagnosis Generalized weakness;Difficulty walking   PT Problem List Decreased strength;Decreased range of motion;Decreased activity tolerance;Decreased balance;Decreased mobility;Decreased knowledge of use of DME;Decreased safety awareness;Pain  PT Treatment Interventions DME instruction;Gait training;Functional mobility training;Therapeutic activities;Therapeutic exercise;Balance training;Patient/family education   PT Goals (Current goals can be found in the Care Plan section) Acute Rehab PT Goals Patient Stated Goal: non stated PT Goal Formulation: With patient Time For Goal Achievement: 02/26/14 Potential to Achieve Goals: Fair    Frequency  Min 2X/week   Barriers to discharge Decreased caregiver support pt has an aide during the day, but does not have 24 hour care.    Co-evaluation               End of Session   Activity Tolerance: Patient limited by pain Patient left: in bed;with call bell/phone within reach;with nursing/sitter in room Nurse Communication: Mobility status    Functional Assessment Tool Used: clinical observation Functional Limitation: Mobility: Walking and moving around Mobility: Walking and Moving Around Current Status ([id number]([id number]): At least 40 percent but less than 60 percent impaired, limited or restricted Mobility: Walking and Moving Around Goal Status [phone]([id number]): At least 1 percent but less than 20 percent impaired, limited or restricted    Time: 1020-1057 PT Time Calculation (min) (ACUTE ONLY): 37 min   Charges:   PT Evaluation $Initial PT Evaluation Tier I: 1 Procedure PT Treatments $Therapeutic Activity: 8-22 mins   PT G Codes:   Functional Assessment Tool Used: clinical observation Functional Limitation: Mobility: Walking and moving around    [place], Hale DroneHeidi Kerstine 02/12/2014, 10:58 AM

## 2014-02-12 NOTE — Care Management Note (Addendum)
    Page 1 of 1   02/12/2014     2:40:15 PM CARE MANAGEMENT NOTE 02/12/2014  Patient:  Judy Grant,Judy Grant   Account Number:  [id number]  Date Initiated:  02/12/2014  Documentation initiated by:  Sharrie RothmanBLACKWELL,Clariece Roesler C  Subjective/Objective Assessment:   Pt admitted from home with UTI and shoulder pain. Pt has been living at home and taking care of disabled son. Pt has fallen and is unable to care of her self and her son. Pt is esentially wheelchair bound.     Action/Plan:   PT has recommended SNF. CSW is aware and will arrange discharge to facility.   Anticipated DC Date:  02/12/2014   Anticipated DC Plan:  SKILLED NURSING FACILITY  In-house referral  Clinical Social Worker      DC Planning Services  CM consult      Choice offered to / List presented to:             Status of service:  Completed, signed off Medicare Important Message given?   (If response is "NO", the following Medicare IM given date fields will be blank) Date Medicare IM given:   Medicare IM given by:   Date Additional Medicare IM given:   Additional Medicare IM given by:    Discharge Disposition:  SKILLED NURSING FACILITY  Per UR Regulation:    If discussed at Long Length of Stay Meetings, dates discussed:    Comments:  02/12/14 1440 Arlyss Queenammy Lenorris Karger, RN BSN CM Pt discharging to [facility] today. CSW to arrange discharge to facility.  02/12/14 1130 Arlyss Queenammy Kwesi Sangha, RN BSN CM

## 2014-02-12 NOTE — Progress Notes (Signed)
Patient's IV was removed and was clean, dry, and intact at removal.  Patient was in stable condition at discharge.  Report called to [facility] and given to Dahlia ClientHannah, RN who was able to verbalize understanding.  Patient was escorted to [facility] by [facility] transportation in wheelchair provided by [facility].

## 2014-02-12 NOTE — Clinical Social Work Placement (Signed)
Clinical Social Work Department CLINICAL SOCIAL WORK PLACEMENT NOTE 02/12/2014  Patient:  Judy Grant,Judy Grant  Account Number:  [id number] Admit date:  02/11/2014  Clinical Social Worker:  Derenda FennelKARA Naiara Lombardozzi, LCSW  Date/time:  02/12/2014 02:04 PM  Clinical Social Work is seeking post-discharge placement for this patient at the following level of care:   SKILLED NURSING   (*CSW will update this form in Epic as items are completed)   02/12/2014  [facility]family provided with [facility]'s list of facilities offering this level of care within the geographic area requested by the patient (or if unable, by the patient's family).  02/12/2014  Patient/family informed of their freedom to choose among providers that offer the needed level of care, that participate in Medicare, Medicaid or managed care program needed by the patient, have an available bed and are willing to accept the patient.  02/12/2014  Patient/family informed of [facility]' ownership interest in [facility], as well as of the fact that they are under no obligation to receive care at this facility.  PASARR submitted to EDS on 02/12/2014 PASARR number received on 02/12/2014  FL2 transmitted to all facilities in geographic area requested by pt/family on  02/12/2014 FL2 transmitted to all facilities within larger geographic area on   Patient informed that his/her managed care company has contracts with or will negotiate with  certain facilities, including the following:     Patient/family informed of bed offers received:  02[facility]12/2014 [facility] [place] [facility] [place]  [facility] [facility]  02/12/2014 Patient to be transferred to facility by [facility] Patient and family notified of transfer on 02/12/2014 Name of family member notified:  Rosey Batheresa- daughter  The following physician  request were entered in Epic:   Additional Comments:  Derenda FennelKara Yonas Bunda, LCSW [phone]

## 2014-02-12 NOTE — Discharge Summary (Signed)
Physician Discharge Summary  Judy Grant ZOX:[id number] DOB: Mar 05, 1918 DOA: 02/11/2014  PCP: [name], MD  Admit date: 02/11/2014 Discharge date: 02/12/2014  Time spent: 40 minutes  Recommendations for Outpatient Follow-up:  1. PCP 1 week for evaluation of symptoms. Recommend urinalysis to ensure resolution of UTI.  2. INR 11/20 for coumadin dosing 3. Discharged to Nursing facility  Discharge Diagnoses:  Principal Problem:   Weakness generalized Active Problems:   Paroxysmal atrial fibrillation   Chronic kidney disease   Hemiplegia, unspecified, affecting dominant side   Long term current use of anticoagulant therapy   UTI (urinary tract infection)   Failure to thrive (0-17)   Fall in home   Chronic combined systolic and diastolic congestive heart failure   Osteoarthritis of right shoulder   Essential hypertension   Discharge Condition: stable  Diet recommendation: Diet dysfunction 3  Filed Weights   02/11/14 1001 02/11/14 1622 02/12/14 0700  Weight: 70.308 kg (155 lb) 63.2 kg (139 lb 5.3 oz) 62 kg (136 lb 11 oz)    History of present illness:  Judy Grant is a 80 y.o. female with a history of chronic systolic and diastolic heart failure with an ejection fraction of 30% and grade 2 diastolic dysfunction per echo in the summer 2014; chronic atrial fibrillation-on Coumadin, stage II chronic kidney disease, and hypertension. She was recently hospitalized and discharged on 02/05/14 for treatment of urinary tract infection and atrial fibrillation with rapid ventricular response. She was doing well up until a few days following discharge, when she apparently fell on the floor as she was trying to get up to go to the bedside commode. She fell on her right side. She said that she slid on the floor between her bed and the bedside commode. She complained of right shoulder pain. She denied losing consciousness. She denied a headache or head trauma. Since the  fall, the family reported that she had not been herself. She is chronically wheelchair-bound, but got around in her wheelchair at home without too much difficulty up until recently. She had been more generally weak and unable to provide for herself and her disabled son like she had been prior to the previous hospitalization. She denied chest pain, shortness of breath, nausea, vomiting, diarrhea, or abdominal pain.  In the ED on 02/11/14, she was tachycardic and in atrial fibrillation with a heart rate ranging from 105 up to 128 bpm. Her EKG revealed atrial fibrillation with a heart rate of 128 bpm. Her lab data were significant for WBC of 13.7, sodium of 135, negative troponin I, INR 3.04, and normal CK of 136. Her urinalysis revealed 7-10 daily CBCs and many bacteria. Her urine drug screen was positive for opiates. Radiographic studies revealed no acute fractures of her right shoulder, cervical spine, or skull. There were severe glenohumeral joint degenerative changes of her right shoulder. She was admitted for observation and requested placement and an assisted-living facility that can both accommodate the patient and her disabled son.  Hospital Course:  1. Generalized weakness/failure to thrive and in the setting of a recent nontraumatic fall at home. The patient is wheelchair-bound, but had been managing relatively well with some assistance from family and home health services. She had also been taking care of her disabled son. She is failing to thrive because of senile decline from her age and comorbid conditions. Her nephew, Lovie Macadamia (patient advocate at [facility]) expressed that the family had been trying to arrange for an assisted living facility that could  accommodate both the patient and her disabled son. The social workers had already discussed this with the family in the ED. The patient was admitted for observation to treat her pain and urinary tract infection and to try to assist the family with  acquiring placement. 2. Severe osteoarthritis of the right shoulder/shoulder pain and severe degenerative changes of cervical spine.  No acute injury. Pain management with scheduled tylenol and prn oxycodone as well as topical Voltaren gel for the right shoulder. 3. History of stroke with chronic right-sided hemiparesis. Continue Coumadin. INR 2.7 at discharge. Recommend INR check 02/14/14  4. Paroxysmal atrial fibrillation with mild RVR. Given 1 dose cardizem. Home BB resumed. At discharge rate controlled. INR 2.7 at discharge. Coumadin as above 5. Malignant hypertension. We'll continue metoprolol and ARB. Given cardizem on presentation. At discharge fair control.  6. Chronic combined systolic and chronic diastolic heart failure. Remained stable and compensated. Continue beta blocker, Lasix, ARB. 7. Stage II chronic kidney disease. Remained stable.  8. Urinary tract infection. The patient was discharged home on Ceftin last week. She was not sure if she'd been taking it. The urine culture revealed Escherichia sensitive to cipro. Will discharge with 4 more days ceftin.  Procedures:  none  Consultations:  none  Discharge Exam: Filed Vitals:   02/12/14 1230  BP: 109/54  Pulse: 66  Temp:   Resp:     General: frail, appears comfortable Cardiovascular: irregularly irregular  +murmur no LE edema Respiratory: normal effort BS clear bilaterally no wheeze  Discharge Instructions You were cared for by a hospitalist during your hospital stay. If you have any questions about your discharge medications or the care you received while you were in the hospital after you are discharged, you can call the unit and asked to speak with the hospitalist on call if the hospitalist that took care of you is not available. Once you are discharged, your primary care physician will handle any further medical issues. Please note that NO REFILLS for any discharge medications will be authorized once you are  discharged, as it is imperative that you return to your primary care physician (or establish a relationship with a primary care physician if you do not have one) for your aftercare needs so that they can reassess your need for medications and monitor your lab values.   Current Discharge Medication List    START taking these medications   Details  acetaminophen (TYLENOL) 500 MG tablet Take 1 tablet (500 mg total) by mouth every 8 (eight) hours. Qty: 30 tablet, Refills: 0    diclofenac sodium (VOLTAREN) 1 % GEL Apply 2 g topically 3 (three) times daily. Qty: 1 Tube, Refills: 0      CONTINUE these medications which have CHANGED   Details  cefUROXime (CEFTIN) 250 MG tablet Take 1 tablet (250 mg total) by mouth 2 (two) times daily with a meal. Qty: 8 tablet, Refills: 0      CONTINUE these medications which have NOT CHANGED   Details  alendronate (FOSAMAX) 70 MG tablet TAKE 1 TAB EACH WEEK 30 MIN PRIOR TO BREAKFAST WITH LARGE GLASS OF WATER. REMAIN UPRIGHT. Qty: 4 tablet, Refills: 3    allopurinol (ZYLOPRIM) 100 MG tablet TAKE ONE TABLET BY MOUTH ONCE DAILY. Qty: 30 tablet, Refills: 3    ASPIRIN LOW DOSE 81 MG EC tablet TAKE ONE TABLET BY MOUTH ONCE DAILY. Qty: 30 tablet, Refills: 3    benzonatate (TESSALON) 100 MG capsule Take 1 capsule (100 mg total) by  mouth 3 (three) times daily as needed for cough. Qty: 20 capsule, Refills: 0    Calcium Carb-Cholecalciferol 500-400 MG-UNIT TABS TAKE (1) TABLET BY MOUTH (3) TIMES DAILY. Qty: 90 tablet, Refills: 6    COLACE 50 MG capsule TAKE (1) CAPSULE BY MOUTH TWICE DAILY. Qty: 60 capsule, Refills: 3    feeding supplement, ENSURE COMPLETE, (ENSURE COMPLETE) LIQD Take 237 mLs by mouth 2 (two) times daily between meals.    fluticasone (FLONASE) 50 MCG/ACT nasal spray USE 1 SPRAY IN EACH NOSTRIL ONCE DAILY. Qty: 16 g, Refills: 2    furosemide (LASIX) 40 MG tablet TAKE ONE TABLET BY MOUTH ONCE DAILY. Qty: 30 tablet, Refills: 0     HYDROcodone-acetaminophen (NORCO/VICODIN) 5-325 MG per tablet Take 1 tablet by mouth every 6 (six) hours as needed (pain).     menthol-cetylpyridinium (CEPACOL) 3 MG lozenge Take 1 lozenge (3 mg total) by mouth as needed for sore throat. Qty: 100 tablet, Refills: 12    metoprolol (LOPRESSOR) 50 MG tablet Take 50-75 mg by mouth 2 (two) times daily. 75 mg each morning and 50 mg at bedtime.    polyethylene glycol powder (GLYCOLAX/MIRALAX) powder Take 17 g by mouth daily as needed (for constipation).    potassium chloride (K-DUR) 10 MEQ tablet TAKE 1 TABLET BY MOUTH ON MONDAY,WEDNESDAY AND FRIDAY. Qty: 12 tablet, Refills: 3    simvastatin (ZOCOR) 20 MG tablet TAKE (1) TABLET BY MOUTH AT BEDTIME FOR CHOLESTEROL. Qty: 30 tablet, Refills: 3    valsartan (DIOVAN) 80 MG tablet Take 1 tablet by mouth daily.    !! warfarin (COUMADIN) 1 MG tablet Take 3.5-5 mg by mouth daily. Takes with 2.5mg  daily for a total of 3.5mg  daily except on Mondays patient takes 5mg (2 of the 2.5mg )    !! warfarin (COUMADIN) 2.5 MG tablet Take 3.5-5 mg by mouth daily. Patient takes with 1mg  for a total of 3.5mg  except on Monday patient takes 5mg (2 of the 2.5mg )    Wheat Dextrin (BENEFIBER PO) Take 1 each by mouth daily.    !! warfarin (COUMADIN) 1 MG tablet TAKE 1 TABLET BY MOUTH ONCE A DAY,ALONG WITH 2.5 MG DAILY TO EQUAL 3.5 MG. Qty: 30 tablet, Refills: 3    !! warfarin (COUMADIN) 2.5 MG tablet TAKE 1 TABLET BY MOUTH ONCE A DAY,ALONG WITH 1 MG TO EQUAL 3.5 MG DAILY. Qty: 30 tablet, Refills: 3     !! - Potential duplicate medications found. Please discuss with provider.     No Known Allergies Follow-up Information    Follow up with [name], MD. Schedule an appointment as soon as possible for a visit in 1 week.   Specialty:  Family Medicine   Why:  for evaluation of symptoms   Contact information:   [address] [place] [address] [phone]        The results of significant  diagnostics from this hospitalization (including imaging, microbiology, ancillary and laboratory) are listed below for reference.    Significant Diagnostic Studies: Dg Chest 1 View  02/11/2014   CLINICAL DATA:  Larey Seat 3 days ago. Weakness for 1-2 weeks. Chest and right shoulder pain.  EXAM: CHEST - 1 VIEW; RIGHT SHOULDER - 2+ VIEW  COMPARISON:  Chest x-ray 02/03/2014  FINDINGS: Chest x-ray:  The heart is mildly enlarged but stable. There is tortuosity and calcification of the thoracic aorta. Chronic lung changes but no acute pulmonary findings. The bony thorax is grossly intact.  Right shoulder:  Severe glenohumeral joint  degenerative changes. No acute fracture. The visualized right lung apex is clear.  IMPRESSION: No acute cardiopulmonary findings.  Severe glenohumeral joint degenerative changes. No obvious acute fracture.   Electronically Signed   By: Loralie Champagne M.D.   On: 02/11/2014 12:01   Dg Chest 2 View  01/30/2014   CLINICAL DATA:  Cough and congestion. With history of hypertension valvular heart disease and chronic renal insufficiency  EXAM: CHEST  2 VIEW  COMPARISON:  Portable chest x-ray of March 04, 2013.  FINDINGS: The lungs are mildly hyperinflated but clear. There is hemidiaphragm flattening. The cardiac silhouette is enlarged but stable. The pulmonary vascularity is not engorged. There is tortuosity of the descending thoracic aorta. There is severe degenerative change of the right shoulder with milder degenerative change of the left shoulder.  IMPRESSION: COPD and cardiomegaly.  There is no evidence of pneumonia nor CHF.   Electronically Signed   By: David  [place]   On: 01/30/2014 16:57   Dg Shoulder Right  02/11/2014   CLINICAL DATA:  Larey Seat 3 days ago. Weakness for 1-2 weeks. Chest and right shoulder pain.  EXAM: CHEST - 1 VIEW; RIGHT SHOULDER - 2+ VIEW  COMPARISON:  Chest x-ray 02/03/2014  FINDINGS: Chest x-ray:  The heart is mildly enlarged but stable. There is tortuosity and  calcification of the thoracic aorta. Chronic lung changes but no acute pulmonary findings. The bony thorax is grossly intact.  Right shoulder:  Severe glenohumeral joint degenerative changes. No acute fracture. The visualized right lung apex is clear.  IMPRESSION: No acute cardiopulmonary findings.  Severe glenohumeral joint degenerative changes. No obvious acute fracture.   Electronically Signed   By: Loralie Champagne M.D.   On: 02/11/2014 12:01   Ct Head Wo Contrast  02/11/2014   CLINICAL DATA:  80 year old female with weakness for 1-2 weeks. Fall from bed 3 days ago. Initial encounter.  EXAM: CT HEAD WITHOUT CONTRAST  CT CERVICAL SPINE WITHOUT CONTRAST  TECHNIQUE: Multidetector CT imaging of the head and cervical spine was performed following the standard protocol without intravenous contrast. Multiplanar CT image reconstructions of the cervical spine were also generated.  COMPARISON:  Head CT without contrast 01/05/2012 and brain MRI from that same day.  FINDINGS: CT HEAD FINDINGS  Bubbly opacity in the right maxillary sinus. The right orbital floor appears stable. Postoperative changes to both globes with otherwise negative orbits soft tissues. Other Visualized paranasal sinuses and mastoids are clear.  No scalp hematoma identified.  Calvarium intact.  Calcified atherosclerosis at the skull base. Mild further generalized cerebral volume loss since 2013. No ventriculomegaly. No midline shift, mass effect, or evidence of intracranial mass lesion. Chronic patchy white matter hypodensity has mildly progressed in both hemispheres. No evidence of cortically based acute infarction identified. No acute intracranial hemorrhage identified. No suspicious intracranial vascular hyperdensity.  CT CERVICAL SPINE FINDINGS  Study is intermittently degraded by motion artifact despite repeated imaging attempts.  Visualized skull base is intact. No atlanto-occipital dissociation. Trace anterolisthesis at C7-T1 with associated  moderate severe facet hypertrophy. 2-3 mm of anterolisthesis of C5 on C6 with moderate to severe right side facet hypertrophy. 2 mm of anterolisthesis of C4 on C5 with moderate to severe facet hypertrophy greater on the left. C1-C2 alignment preserved, severe left side C1-C2 joint space loss with subchondral sclerosis and cysts. Odontoid appears intact. No acute cervical spine fracture identified.  Widespread degenerative cervical spinal stenosis is suboptimally evaluated due to motion artifact, but appears to be moderate to severe  it C3-C4.  Grossly intact visualized upper thoracic levels. Negative visible lung apices. Calcified atherosclerosis in the neck. Otherwise negative noncontrast paraspinal soft tissues.  IMPRESSION: 1. No acute intracranial abnormality. Chronic cerebral volume loss and white matter disease. 2. No acute fracture or listhesis identified in the cervical spine. Ligamentous injury is not excluded. 3. Widespread cervical spine degeneration with multilevel spondylolisthesis and moderate to severe degenerative spinal stenosis at C3-C4. 4. Bubbly opacity in the right maxillary sinus, favor inflammatory such as due to acute sinusitis.   Electronically Signed   By: Augusto GambleLee  Hall M.D.   On: 02/11/2014 12:02   Ct Cervical Spine Wo Contrast  02/11/2014   CLINICAL DATA:  80 year old female with weakness for 1-2 weeks. Fall from bed 3 days ago. Initial encounter.  EXAM: CT HEAD WITHOUT CONTRAST  CT CERVICAL SPINE WITHOUT CONTRAST  TECHNIQUE: Multidetector CT imaging of the head and cervical spine was performed following the standard protocol without intravenous contrast. Multiplanar CT image reconstructions of the cervical spine were also generated.  COMPARISON:  Head CT without contrast 01/05/2012 and brain MRI from that same day.  FINDINGS: CT HEAD FINDINGS  Bubbly opacity in the right maxillary sinus. The right orbital floor appears stable. Postoperative changes to both globes with otherwise negative  orbits soft tissues. Other Visualized paranasal sinuses and mastoids are clear.  No scalp hematoma identified.  Calvarium intact.  Calcified atherosclerosis at the skull base. Mild further generalized cerebral volume loss since 2013. No ventriculomegaly. No midline shift, mass effect, or evidence of intracranial mass lesion. Chronic patchy white matter hypodensity has mildly progressed in both hemispheres. No evidence of cortically based acute infarction identified. No acute intracranial hemorrhage identified. No suspicious intracranial vascular hyperdensity.  CT CERVICAL SPINE FINDINGS  Study is intermittently degraded by motion artifact despite repeated imaging attempts.  Visualized skull base is intact. No atlanto-occipital dissociation. Trace anterolisthesis at C7-T1 with associated moderate severe facet hypertrophy. 2-3 mm of anterolisthesis of C5 on C6 with moderate to severe right side facet hypertrophy. 2 mm of anterolisthesis of C4 on C5 with moderate to severe facet hypertrophy greater on the left. C1-C2 alignment preserved, severe left side C1-C2 joint space loss with subchondral sclerosis and cysts. Odontoid appears intact. No acute cervical spine fracture identified.  Widespread degenerative cervical spinal stenosis is suboptimally evaluated due to motion artifact, but appears to be moderate to severe it C3-C4.  Grossly intact visualized upper thoracic levels. Negative visible lung apices. Calcified atherosclerosis in the neck. Otherwise negative noncontrast paraspinal soft tissues.  IMPRESSION: 1. No acute intracranial abnormality. Chronic cerebral volume loss and white matter disease. 2. No acute fracture or listhesis identified in the cervical spine. Ligamentous injury is not excluded. 3. Widespread cervical spine degeneration with multilevel spondylolisthesis and moderate to severe degenerative spinal stenosis at C3-C4. 4. Bubbly opacity in the right maxillary sinus, favor inflammatory such as due  to acute sinusitis.   Electronically Signed   By: Augusto GambleLee  Hall M.D.   On: 02/11/2014 12:02   Dg Chest Portable 1 View  02/03/2014   CLINICAL DATA:  Weakness and cough. The patient is unsure of time frame.  EXAM: PORTABLE CHEST - 1 VIEW  COMPARISON:  None.  FINDINGS: The heart size and mediastinal contours are stable. The aorta is tortuous. Heart size is enlarged. There is no focal infiltrate, pulmonary edema, or pleural effusion. The lungs are hyperinflated. The visualized skeletal structures are stable.  IMPRESSION: Emphysema.  No acute cardiopulmonary disease.   Electronically Signed  By: Sherian Rein M.D.   On: 02/03/2014 12:34    Microbiology: Recent Results (from the past 240 hour(s))  Culture, blood (routine x 2)     Status: None   Collection Time: 02/03/14 11:47 AM  Result Value Ref Range Status   Specimen Description BLOOD LEFT ARM  Final   Special Requests BOTTLES DRAWN AEROBIC AND ANAEROBIC 6CC  Final   Culture NO GROWTH 5 DAYS  Final   Report Status 02/08/2014 FINAL  Final  Culture, blood (routine x 2)     Status: None   Collection Time: 02/03/14 12:09 PM  Result Value Ref Range Status   Specimen Description BLOOD LEFT HAND DRAWN BY RN  Final   Special Requests BOTTLES DRAWN AEROBIC ONLY 4CC  Final   Culture NO GROWTH 5 DAYS  Final   Report Status 02/08/2014 FINAL  Final  MRSA PCR Screening     Status: None   Collection Time: 02/03/14  4:01 PM  Result Value Ref Range Status   MRSA by PCR NEGATIVE NEGATIVE Final    Comment:        The GeneXpert MRSA Assay (FDA approved for NASAL specimens only), is one component of a comprehensive MRSA colonization surveillance program. It is not intended to diagnose MRSA infection nor to guide or monitor treatment for MRSA infections.   Rapid strep screen     Status: None   Collection Time: 02/03/14  5:15 PM  Result Value Ref Range Status   Streptococcus, Group A Screen (Direct) NEGATIVE NEGATIVE Final    Comment: (NOTE) A Rapid  Antigen test may result negative if the antigen level in the sample is below the detection level of this test. The FDA has not cleared this test as a stand-alone test therefore the rapid antigen negative result has reflexed to a Group A Strep culture.   Culture, Group A Strep     Status: None   Collection Time: 02/03/14  5:15 PM  Result Value Ref Range Status   Specimen Description THROAT  Final   Special Requests NONE  Final   Culture   Final    No Beta Hemolytic Streptococci Isolated Performed at [facility]    Report Status 02/05/2014 FINAL  Final  Culture, Urine     Status: None   Collection Time: 02/04/14  9:30 AM  Result Value Ref Range Status   Specimen Description URINE, CATHETERIZED  Final   Special Requests NONE  Final   Culture  Setup Time   Final    02/05/2014 13:38 Performed at [facility] Count   Final    >=100,000 COLONIES/ML Performed at [facility]    Culture   Final    ESCHERICHIA COLI Performed at [facility]    Report Status 02/07/2014 FINAL  Final   Organism ID, Bacteria ESCHERICHIA COLI  Final      Susceptibility   Escherichia coli - MIC*    AMPICILLIN <=2 SENSITIVE Sensitive     CEFAZOLIN <=4 SENSITIVE Sensitive     CEFTRIAXONE <=1 SENSITIVE Sensitive     CIPROFLOXACIN <=0.25 SENSITIVE Sensitive     GENTAMICIN <=1 SENSITIVE Sensitive     LEVOFLOXACIN <=0.12 SENSITIVE Sensitive     NITROFURANTOIN <=16 SENSITIVE Sensitive     TOBRAMYCIN <=1 SENSITIVE Sensitive     TRIMETH/SULFA <=20 SENSITIVE Sensitive     PIP/TAZO <=4 SENSITIVE Sensitive     * ESCHERICHIA COLI     Labs: Basic Metabolic  Panel:  Recent Labs Lab 02/11/14 1041 02/12/14 0623  NA 135* 135*  K 4.2 4.4  CL 95* 98  CO2 27 26  GLUCOSE 105* 115*  BUN 17 15  CREATININE 1.03 1.00  CALCIUM 10.4 9.5   Liver Function Tests:  Recent Labs Lab 02/11/14 1041  AST 18  ALT 11  ALKPHOS 64  BILITOT 1.2  PROT 7.7  ALBUMIN 3.1*   No  results for input(s): LIPASE, AMYLASE in the last 168 hours. No results for input(s): AMMONIA in the last 168 hours. CBC:  Recent Labs Lab 02/11/14 1041 02/12/14 0623  WBC 13.7* 14.8*  NEUTROABS 11.4*  --   HGB 12.2 11.3*  HCT 37.1 34.6*  MCV 95.1 95.3  PLT 273 283   Cardiac Enzymes:  Recent Labs Lab 02/11/14 1041 02/11/14 1044  CKTOTAL  --  136  TROPONINI <0.30  --    BNP: BNP (last 3 results)  Recent Labs  03/04/13 1243  PROBNP 4971.0*   CBG: No results for input(s): GLUCAP in the last 168 hours.     SignedGwenyth Bender  [facility] 02/12/2014, 1:48 PM

## 2014-02-12 NOTE — Progress Notes (Signed)
ANTICOAGULATION CONSULT NOTE - follow up  Pharmacy Consult for Coumadin (chronic Rx PTA) Indication: atrial fibrillation  No Known Allergies  Patient Measurements: Height: 5\' 6"  (167.6 cm) Weight: 136 lb 11 oz (62 kg) IBW/kg (Calculated) : 59.3  Vital Signs: Temp: 98.1 F (36.7 C) (11/18 0643) Temp Source: Oral (11/18 0643) BP: 109/54 mmHg (11/18 1230) Pulse Rate: 66 (11/18 1230)  Labs:  Recent Labs  02/11/14 1041 02/11/14 1044 02/12/14 0623  HGB 12.2  --  11.3*  HCT 37.1  --  34.6*  PLT 273  --  283  APTT 43*  --   --   LABPROT 31.7*  --  29.6*  INR 3.04*  --  2.78*  CREATININE 1.03  --  1.00  CKTOTAL  --  136  --   TROPONINI <0.30  --   --    Estimated Creatinine Clearance: 32.2 mL/min (by C-G formula based on Cr of 1).  Medical History: Past Medical History  Diagnosis Date  . Hyperlipidemia   . Osteoporosis   . Hypertension     with severe left ventricular hypertrophy; normal ejection fraction in 04/2005  . Valvular heart disease     Mild to moderate aortic stenosis; moderate to severe mitral stenosis in 2007; mild MR  . Paroxysmal atrial fibrillation     Remote  . Diverticulosis     Pancolonic; h/o LGI bleeding and diverticulitis  . Degenerative joint disease     Knees  . Chronic kidney disease     Mild; creatinine of 1.19-1.28 in recent years  . Anemia, iron deficiency   . Popliteal cyst     Left  . Fracture of wrist 2008    left  . Gout   . Stroke right side deficit  . NSTEMI, initial episode of care 01/05/2012  . Chronic combined systolic and diastolic congestive heart failure 02/2013    EF 30%; Grade 2 diastolic dys   Medications:  Prescriptions prior to admission  Medication Sig Dispense Refill Last Dose  . alendronate (FOSAMAX) 70 MG tablet TAKE 1 TAB EACH WEEK 30 MIN PRIOR TO BREAKFAST WITH LARGE GLASS OF WATER. REMAIN UPRIGHT. 4 tablet 3 Past Week at Unknown time  . allopurinol (ZYLOPRIM) 100 MG tablet TAKE ONE TABLET BY MOUTH ONCE  DAILY. 30 tablet 3 02/10/2014 at Unknown time  . ASPIRIN LOW DOSE 81 MG EC tablet TAKE ONE TABLET BY MOUTH ONCE DAILY. 30 tablet 3 02/10/2014 at Unknown time  . benzonatate (TESSALON) 100 MG capsule Take 1 capsule (100 mg total) by mouth 3 (three) times daily as needed for cough. 20 capsule 0 Past Week at Unknown time  . Calcium Carb-Cholecalciferol 500-400 MG-UNIT TABS TAKE (1) TABLET BY MOUTH (3) TIMES DAILY. 90 tablet 6 02/10/2014 at Unknown time  . COLACE 50 MG capsule TAKE (1) CAPSULE BY MOUTH TWICE DAILY. 60 capsule 3 02/10/2014 at Unknown time  . feeding supplement, ENSURE COMPLETE, (ENSURE COMPLETE) LIQD Take 237 mLs by mouth 2 (two) times daily between meals.   02/10/2014 at Unknown time  . fluticasone (FLONASE) 50 MCG/ACT nasal spray USE 1 SPRAY IN EACH NOSTRIL ONCE DAILY. 16 g 2 02/10/2014 at Unknown time  . furosemide (LASIX) 40 MG tablet TAKE ONE TABLET BY MOUTH ONCE DAILY. 30 tablet 0 02/10/2014 at Unknown time  . HYDROcodone-acetaminophen (NORCO/VICODIN) 5-325 MG per tablet Take 1 tablet by mouth every 6 (six) hours as needed (pain).    02/10/2014 at Unknown time  . menthol-cetylpyridinium (CEPACOL) 3 MG lozenge Take  1 lozenge (3 mg total) by mouth as needed for sore throat. 100 tablet 12 Past Week at Unknown time  . metoprolol (LOPRESSOR) 50 MG tablet Take 50-75 mg by mouth 2 (two) times daily. 75 mg each morning and 50 mg at bedtime.   02/10/2014 at 800  . polyethylene glycol powder (GLYCOLAX/MIRALAX) powder Take 17 g by mouth daily as needed (for constipation).   02/10/2014 at Unknown time  . potassium chloride (K-DUR) 10 MEQ tablet TAKE 1 TABLET BY MOUTH ON MONDAY,WEDNESDAY AND FRIDAY. 12 tablet 3 02/10/2014 at Unknown time  . simvastatin (ZOCOR) 20 MG tablet TAKE (1) TABLET BY MOUTH AT BEDTIME FOR CHOLESTEROL. 30 tablet 3 02/10/2014 at Unknown time  . valsartan (DIOVAN) 80 MG tablet Take 1 tablet by mouth daily.   02/10/2014 at Unknown time  . warfarin (COUMADIN) 1 MG tablet Take  3.5-5 mg by mouth daily. Takes with 2.5mg  daily for a total of 3.5mg  daily except on Mondays patient takes 5mg (2 of the 2.5mg )   02/10/2014 at Unknown time  . warfarin (COUMADIN) 2.5 MG tablet Take 3.5-5 mg by mouth daily. Patient takes with 1mg  for a total of 3.5mg  except on Monday patient takes 5mg (2 of the 2.5mg )   02/10/2014 at Unknown time  . Wheat Dextrin (BENEFIBER PO) Take 1 each by mouth daily.   02/10/2014 at Unknown time  . cefUROXime (CEFTIN) 250 MG tablet Take 1 tablet (250 mg total) by mouth 2 (two) times daily with a meal. (Patient not taking: Reported on 02/11/2014) 8 tablet 0 Completed Course at Unknown time  . warfarin (COUMADIN) 1 MG tablet TAKE 1 TABLET BY MOUTH ONCE A DAY,ALONG WITH 2.5 MG DAILY TO EQUAL 3.5 MG. (Patient not taking: Reported on 02/11/2014) 30 tablet 3   . warfarin (COUMADIN) 2.5 MG tablet TAKE 1 TABLET BY MOUTH ONCE A DAY,ALONG WITH 1 MG TO EQUAL 3.5 MG DAILY. (Patient not taking: Reported on 02/11/2014) 30 tablet 3    Assessment: 80yo female on chronic Coumadin PTA for h/o afib.  Home dose listed above.  INR is therapeutic today.   Goal of Therapy:  INR 2-3 Monitor platelets by anticoagulation protocol: Yes   Plan:  Coumadin 3.5mg  today x 1 (home dose) INR daily  Valrie HartHall, [place] A 02/12/2014,12:59 PM

## 2014-02-12 NOTE — Clinical Social Work Psychosocial (Signed)
Clinical Social Work Department BRIEF PSYCHOSOCIAL ASSESSMENT 02/12/2014  Patient:  Judy Grant, Judy Grant     Account Number:  [id number]     Admit date:  02/11/2014  Clinical Social Worker:  Wyatt Haste  Date/Time:  02/12/2014 02:11 PM  Referred by:  Physician  Date Referred:  02/12/2014 Referred for  SNF Placement   Other Referral:   Interview type:  Patient Other interview type:   daughter- Helene Kelp    PSYCHOSOCIAL DATA Living Status:  FAMILY Admitted from facility:   Level of care:   Primary support name:  Helene Kelp Primary support relationship to patient:  CHILD, ADULT Degree of support available:   supportive    CURRENT CONCERNS Current Concerns  Post-Acute Placement   Other Concerns:    SOCIAL WORK ASSESSMENT / PLAN CSW met with pt and family in ED yesterday. Pt in observation on floor due to failure to thrive/UTI. Pt lives with her son who is disabled. They have a CAP aid for most of the day, but things have been more difficult recently. CSW followed up on plan for placement and pt and family agreeable. PT then assessed pt and felt she would need SNF. CSW provided SNF list and discussed placement process. Referral faxed and bed offers given. Pt and family accept [facility]. Facility notified. Pt to d/c today and daughter arranging transport via [facility]. D/C summary faxed.   Assessment/plan status:  Referral to [employer] Other assessment/ plan:   Information/referral to community resources:   SNF list    PATIENT'S/FAMILY'S RESPONSE TO PLAN OF CARE: Pt and family agreeable to transfer to [facility] today.       Benay Pike, [place]

## 2014-02-14 ENCOUNTER — Ambulatory Visit: Payer: PRIVATE HEALTH INSURANCE | Admitting: Adult Health

## 2014-02-25 ENCOUNTER — Ambulatory Visit: Payer: PRIVATE HEALTH INSURANCE | Admitting: Family Medicine

## 2014-03-25 ENCOUNTER — Encounter: Payer: Self-pay | Admitting: *Deleted

## 2014-03-28 DIAGNOSIS — M6281 Muscle weakness (generalized): Secondary | ICD-10-CM | POA: Diagnosis not present

## 2014-03-28 DIAGNOSIS — I5040 Unspecified combined systolic (congestive) and diastolic (congestive) heart failure: Secondary | ICD-10-CM | POA: Diagnosis not present

## 2014-03-28 DIAGNOSIS — R278 Other lack of coordination: Secondary | ICD-10-CM | POA: Diagnosis not present

## 2014-03-28 DIAGNOSIS — R2681 Unsteadiness on feet: Secondary | ICD-10-CM | POA: Diagnosis not present

## 2014-03-28 DIAGNOSIS — R41841 Cognitive communication deficit: Secondary | ICD-10-CM | POA: Diagnosis not present

## 2014-03-28 DIAGNOSIS — R293 Abnormal posture: Secondary | ICD-10-CM | POA: Diagnosis not present

## 2014-03-29 DIAGNOSIS — M6281 Muscle weakness (generalized): Secondary | ICD-10-CM | POA: Diagnosis not present

## 2014-03-29 DIAGNOSIS — R41841 Cognitive communication deficit: Secondary | ICD-10-CM | POA: Diagnosis not present

## 2014-03-29 DIAGNOSIS — R278 Other lack of coordination: Secondary | ICD-10-CM | POA: Diagnosis not present

## 2014-03-29 DIAGNOSIS — Z7901 Long term (current) use of anticoagulants: Secondary | ICD-10-CM | POA: Diagnosis not present

## 2014-03-29 DIAGNOSIS — I5040 Unspecified combined systolic (congestive) and diastolic (congestive) heart failure: Secondary | ICD-10-CM | POA: Diagnosis not present

## 2014-03-29 DIAGNOSIS — R293 Abnormal posture: Secondary | ICD-10-CM | POA: Diagnosis not present

## 2014-03-29 DIAGNOSIS — R2681 Unsteadiness on feet: Secondary | ICD-10-CM | POA: Diagnosis not present

## 2014-03-30 DIAGNOSIS — M6281 Muscle weakness (generalized): Secondary | ICD-10-CM | POA: Diagnosis not present

## 2014-03-30 DIAGNOSIS — I480 Paroxysmal atrial fibrillation: Secondary | ICD-10-CM | POA: Diagnosis not present

## 2014-03-30 DIAGNOSIS — R2681 Unsteadiness on feet: Secondary | ICD-10-CM | POA: Diagnosis not present

## 2014-03-30 DIAGNOSIS — R278 Other lack of coordination: Secondary | ICD-10-CM | POA: Diagnosis not present

## 2014-03-30 DIAGNOSIS — R293 Abnormal posture: Secondary | ICD-10-CM | POA: Diagnosis not present

## 2014-03-30 DIAGNOSIS — I5040 Unspecified combined systolic (congestive) and diastolic (congestive) heart failure: Secondary | ICD-10-CM | POA: Diagnosis not present

## 2014-03-30 DIAGNOSIS — R41841 Cognitive communication deficit: Secondary | ICD-10-CM | POA: Diagnosis not present

## 2014-03-31 DIAGNOSIS — R41841 Cognitive communication deficit: Secondary | ICD-10-CM | POA: Diagnosis not present

## 2014-03-31 DIAGNOSIS — I5040 Unspecified combined systolic (congestive) and diastolic (congestive) heart failure: Secondary | ICD-10-CM | POA: Diagnosis not present

## 2014-03-31 DIAGNOSIS — M6281 Muscle weakness (generalized): Secondary | ICD-10-CM | POA: Diagnosis not present

## 2014-03-31 DIAGNOSIS — R293 Abnormal posture: Secondary | ICD-10-CM | POA: Diagnosis not present

## 2014-03-31 DIAGNOSIS — R2681 Unsteadiness on feet: Secondary | ICD-10-CM | POA: Diagnosis not present

## 2014-03-31 DIAGNOSIS — R278 Other lack of coordination: Secondary | ICD-10-CM | POA: Diagnosis not present

## 2014-04-01 DIAGNOSIS — R41841 Cognitive communication deficit: Secondary | ICD-10-CM | POA: Diagnosis not present

## 2014-04-01 DIAGNOSIS — R278 Other lack of coordination: Secondary | ICD-10-CM | POA: Diagnosis not present

## 2014-04-01 DIAGNOSIS — R2681 Unsteadiness on feet: Secondary | ICD-10-CM | POA: Diagnosis not present

## 2014-04-01 DIAGNOSIS — I480 Paroxysmal atrial fibrillation: Secondary | ICD-10-CM | POA: Diagnosis not present

## 2014-04-01 DIAGNOSIS — I5040 Unspecified combined systolic (congestive) and diastolic (congestive) heart failure: Secondary | ICD-10-CM | POA: Diagnosis not present

## 2014-04-01 DIAGNOSIS — E875 Hyperkalemia: Secondary | ICD-10-CM | POA: Diagnosis not present

## 2014-04-01 DIAGNOSIS — R040 Epistaxis: Secondary | ICD-10-CM | POA: Diagnosis not present

## 2014-04-01 DIAGNOSIS — N182 Chronic kidney disease, stage 2 (mild): Secondary | ICD-10-CM | POA: Diagnosis not present

## 2014-04-01 DIAGNOSIS — I482 Chronic atrial fibrillation: Secondary | ICD-10-CM | POA: Diagnosis not present

## 2014-04-01 DIAGNOSIS — R293 Abnormal posture: Secondary | ICD-10-CM | POA: Diagnosis not present

## 2014-04-01 DIAGNOSIS — D509 Iron deficiency anemia, unspecified: Secondary | ICD-10-CM | POA: Diagnosis not present

## 2014-04-01 DIAGNOSIS — Z79899 Other long term (current) drug therapy: Secondary | ICD-10-CM | POA: Diagnosis not present

## 2014-04-01 DIAGNOSIS — D649 Anemia, unspecified: Secondary | ICD-10-CM | POA: Diagnosis not present

## 2014-04-01 DIAGNOSIS — M6281 Muscle weakness (generalized): Secondary | ICD-10-CM | POA: Diagnosis not present

## 2014-04-02 DIAGNOSIS — R41841 Cognitive communication deficit: Secondary | ICD-10-CM | POA: Diagnosis not present

## 2014-04-02 DIAGNOSIS — R278 Other lack of coordination: Secondary | ICD-10-CM | POA: Diagnosis not present

## 2014-04-02 DIAGNOSIS — M6281 Muscle weakness (generalized): Secondary | ICD-10-CM | POA: Diagnosis not present

## 2014-04-02 DIAGNOSIS — R293 Abnormal posture: Secondary | ICD-10-CM | POA: Diagnosis not present

## 2014-04-02 DIAGNOSIS — I5040 Unspecified combined systolic (congestive) and diastolic (congestive) heart failure: Secondary | ICD-10-CM | POA: Diagnosis not present

## 2014-04-02 DIAGNOSIS — I480 Paroxysmal atrial fibrillation: Secondary | ICD-10-CM | POA: Diagnosis not present

## 2014-04-02 DIAGNOSIS — Z7901 Long term (current) use of anticoagulants: Secondary | ICD-10-CM | POA: Diagnosis not present

## 2014-04-02 DIAGNOSIS — I482 Chronic atrial fibrillation: Secondary | ICD-10-CM | POA: Diagnosis not present

## 2014-04-02 DIAGNOSIS — R2681 Unsteadiness on feet: Secondary | ICD-10-CM | POA: Diagnosis not present

## 2014-04-03 DIAGNOSIS — R278 Other lack of coordination: Secondary | ICD-10-CM | POA: Diagnosis not present

## 2014-04-03 DIAGNOSIS — I5040 Unspecified combined systolic (congestive) and diastolic (congestive) heart failure: Secondary | ICD-10-CM | POA: Diagnosis not present

## 2014-04-03 DIAGNOSIS — M6281 Muscle weakness (generalized): Secondary | ICD-10-CM | POA: Diagnosis not present

## 2014-04-03 DIAGNOSIS — R2681 Unsteadiness on feet: Secondary | ICD-10-CM | POA: Diagnosis not present

## 2014-04-03 DIAGNOSIS — R293 Abnormal posture: Secondary | ICD-10-CM | POA: Diagnosis not present

## 2014-04-03 DIAGNOSIS — R41841 Cognitive communication deficit: Secondary | ICD-10-CM | POA: Diagnosis not present

## 2014-04-04 DIAGNOSIS — R293 Abnormal posture: Secondary | ICD-10-CM | POA: Diagnosis not present

## 2014-04-04 DIAGNOSIS — D649 Anemia, unspecified: Secondary | ICD-10-CM | POA: Diagnosis not present

## 2014-04-04 DIAGNOSIS — M6281 Muscle weakness (generalized): Secondary | ICD-10-CM | POA: Diagnosis not present

## 2014-04-04 DIAGNOSIS — D509 Iron deficiency anemia, unspecified: Secondary | ICD-10-CM | POA: Diagnosis not present

## 2014-04-04 DIAGNOSIS — R278 Other lack of coordination: Secondary | ICD-10-CM | POA: Diagnosis not present

## 2014-04-04 DIAGNOSIS — N182 Chronic kidney disease, stage 2 (mild): Secondary | ICD-10-CM | POA: Diagnosis not present

## 2014-04-04 DIAGNOSIS — Z79899 Other long term (current) drug therapy: Secondary | ICD-10-CM | POA: Diagnosis not present

## 2014-04-04 DIAGNOSIS — R2681 Unsteadiness on feet: Secondary | ICD-10-CM | POA: Diagnosis not present

## 2014-04-04 DIAGNOSIS — I482 Chronic atrial fibrillation: Secondary | ICD-10-CM | POA: Diagnosis not present

## 2014-04-04 DIAGNOSIS — I5040 Unspecified combined systolic (congestive) and diastolic (congestive) heart failure: Secondary | ICD-10-CM | POA: Diagnosis not present

## 2014-04-04 DIAGNOSIS — R41841 Cognitive communication deficit: Secondary | ICD-10-CM | POA: Diagnosis not present

## 2014-04-05 DIAGNOSIS — R2681 Unsteadiness on feet: Secondary | ICD-10-CM | POA: Diagnosis not present

## 2014-04-05 DIAGNOSIS — R293 Abnormal posture: Secondary | ICD-10-CM | POA: Diagnosis not present

## 2014-04-05 DIAGNOSIS — I482 Chronic atrial fibrillation: Secondary | ICD-10-CM | POA: Diagnosis not present

## 2014-04-05 DIAGNOSIS — I480 Paroxysmal atrial fibrillation: Secondary | ICD-10-CM | POA: Diagnosis not present

## 2014-04-05 DIAGNOSIS — M6281 Muscle weakness (generalized): Secondary | ICD-10-CM | POA: Diagnosis not present

## 2014-04-05 DIAGNOSIS — I5040 Unspecified combined systolic (congestive) and diastolic (congestive) heart failure: Secondary | ICD-10-CM | POA: Diagnosis not present

## 2014-04-05 DIAGNOSIS — R278 Other lack of coordination: Secondary | ICD-10-CM | POA: Diagnosis not present

## 2014-04-05 DIAGNOSIS — R41841 Cognitive communication deficit: Secondary | ICD-10-CM | POA: Diagnosis not present

## 2014-04-05 DIAGNOSIS — Z7901 Long term (current) use of anticoagulants: Secondary | ICD-10-CM | POA: Diagnosis not present

## 2014-04-06 DIAGNOSIS — R2681 Unsteadiness on feet: Secondary | ICD-10-CM | POA: Diagnosis not present

## 2014-04-06 DIAGNOSIS — R278 Other lack of coordination: Secondary | ICD-10-CM | POA: Diagnosis not present

## 2014-04-06 DIAGNOSIS — R293 Abnormal posture: Secondary | ICD-10-CM | POA: Diagnosis not present

## 2014-04-06 DIAGNOSIS — I5040 Unspecified combined systolic (congestive) and diastolic (congestive) heart failure: Secondary | ICD-10-CM | POA: Diagnosis not present

## 2014-04-06 DIAGNOSIS — R41841 Cognitive communication deficit: Secondary | ICD-10-CM | POA: Diagnosis not present

## 2014-04-06 DIAGNOSIS — M6281 Muscle weakness (generalized): Secondary | ICD-10-CM | POA: Diagnosis not present

## 2014-04-07 DIAGNOSIS — Z79899 Other long term (current) drug therapy: Secondary | ICD-10-CM | POA: Diagnosis not present

## 2014-04-07 DIAGNOSIS — N189 Chronic kidney disease, unspecified: Secondary | ICD-10-CM | POA: Diagnosis not present

## 2014-04-07 DIAGNOSIS — D649 Anemia, unspecified: Secondary | ICD-10-CM | POA: Diagnosis not present

## 2014-04-07 DIAGNOSIS — E875 Hyperkalemia: Secondary | ICD-10-CM | POA: Diagnosis not present

## 2014-04-07 DIAGNOSIS — R2681 Unsteadiness on feet: Secondary | ICD-10-CM | POA: Diagnosis not present

## 2014-04-07 DIAGNOSIS — M6281 Muscle weakness (generalized): Secondary | ICD-10-CM | POA: Diagnosis not present

## 2014-04-07 DIAGNOSIS — I5040 Unspecified combined systolic (congestive) and diastolic (congestive) heart failure: Secondary | ICD-10-CM | POA: Diagnosis not present

## 2014-04-07 DIAGNOSIS — I480 Paroxysmal atrial fibrillation: Secondary | ICD-10-CM | POA: Diagnosis not present

## 2014-04-07 DIAGNOSIS — I482 Chronic atrial fibrillation: Secondary | ICD-10-CM | POA: Diagnosis not present

## 2014-04-07 DIAGNOSIS — R278 Other lack of coordination: Secondary | ICD-10-CM | POA: Diagnosis not present

## 2014-04-07 DIAGNOSIS — R293 Abnormal posture: Secondary | ICD-10-CM | POA: Diagnosis not present

## 2014-04-07 DIAGNOSIS — R41841 Cognitive communication deficit: Secondary | ICD-10-CM | POA: Diagnosis not present

## 2014-04-07 DIAGNOSIS — N182 Chronic kidney disease, stage 2 (mild): Secondary | ICD-10-CM | POA: Diagnosis not present

## 2014-04-07 DIAGNOSIS — D509 Iron deficiency anemia, unspecified: Secondary | ICD-10-CM | POA: Diagnosis not present

## 2014-04-08 DIAGNOSIS — R293 Abnormal posture: Secondary | ICD-10-CM | POA: Diagnosis not present

## 2014-04-08 DIAGNOSIS — R278 Other lack of coordination: Secondary | ICD-10-CM | POA: Diagnosis not present

## 2014-04-08 DIAGNOSIS — M6281 Muscle weakness (generalized): Secondary | ICD-10-CM | POA: Diagnosis not present

## 2014-04-08 DIAGNOSIS — I5040 Unspecified combined systolic (congestive) and diastolic (congestive) heart failure: Secondary | ICD-10-CM | POA: Diagnosis not present

## 2014-04-08 DIAGNOSIS — R41841 Cognitive communication deficit: Secondary | ICD-10-CM | POA: Diagnosis not present

## 2014-04-08 DIAGNOSIS — R2681 Unsteadiness on feet: Secondary | ICD-10-CM | POA: Diagnosis not present

## 2014-04-09 DIAGNOSIS — R293 Abnormal posture: Secondary | ICD-10-CM | POA: Diagnosis not present

## 2014-04-09 DIAGNOSIS — I5040 Unspecified combined systolic (congestive) and diastolic (congestive) heart failure: Secondary | ICD-10-CM | POA: Diagnosis not present

## 2014-04-09 DIAGNOSIS — R41841 Cognitive communication deficit: Secondary | ICD-10-CM | POA: Diagnosis not present

## 2014-04-09 DIAGNOSIS — R2681 Unsteadiness on feet: Secondary | ICD-10-CM | POA: Diagnosis not present

## 2014-04-09 DIAGNOSIS — Z7901 Long term (current) use of anticoagulants: Secondary | ICD-10-CM | POA: Diagnosis not present

## 2014-04-09 DIAGNOSIS — R278 Other lack of coordination: Secondary | ICD-10-CM | POA: Diagnosis not present

## 2014-04-09 DIAGNOSIS — M6281 Muscle weakness (generalized): Secondary | ICD-10-CM | POA: Diagnosis not present

## 2014-04-10 DIAGNOSIS — R41841 Cognitive communication deficit: Secondary | ICD-10-CM | POA: Diagnosis not present

## 2014-04-10 DIAGNOSIS — I5040 Unspecified combined systolic (congestive) and diastolic (congestive) heart failure: Secondary | ICD-10-CM | POA: Diagnosis not present

## 2014-04-10 DIAGNOSIS — R2681 Unsteadiness on feet: Secondary | ICD-10-CM | POA: Diagnosis not present

## 2014-04-10 DIAGNOSIS — M6281 Muscle weakness (generalized): Secondary | ICD-10-CM | POA: Diagnosis not present

## 2014-04-10 DIAGNOSIS — R293 Abnormal posture: Secondary | ICD-10-CM | POA: Diagnosis not present

## 2014-04-10 DIAGNOSIS — R278 Other lack of coordination: Secondary | ICD-10-CM | POA: Diagnosis not present

## 2014-04-10 DIAGNOSIS — I480 Paroxysmal atrial fibrillation: Secondary | ICD-10-CM | POA: Diagnosis not present

## 2014-04-11 DIAGNOSIS — R278 Other lack of coordination: Secondary | ICD-10-CM | POA: Diagnosis not present

## 2014-04-11 DIAGNOSIS — M6281 Muscle weakness (generalized): Secondary | ICD-10-CM | POA: Diagnosis not present

## 2014-04-11 DIAGNOSIS — R41841 Cognitive communication deficit: Secondary | ICD-10-CM | POA: Diagnosis not present

## 2014-04-11 DIAGNOSIS — R293 Abnormal posture: Secondary | ICD-10-CM | POA: Diagnosis not present

## 2014-04-11 DIAGNOSIS — R2681 Unsteadiness on feet: Secondary | ICD-10-CM | POA: Diagnosis not present

## 2014-04-11 DIAGNOSIS — I5040 Unspecified combined systolic (congestive) and diastolic (congestive) heart failure: Secondary | ICD-10-CM | POA: Diagnosis not present

## 2014-04-12 DIAGNOSIS — I5040 Unspecified combined systolic (congestive) and diastolic (congestive) heart failure: Secondary | ICD-10-CM | POA: Diagnosis not present

## 2014-04-12 DIAGNOSIS — R293 Abnormal posture: Secondary | ICD-10-CM | POA: Diagnosis not present

## 2014-04-12 DIAGNOSIS — R2681 Unsteadiness on feet: Secondary | ICD-10-CM | POA: Diagnosis not present

## 2014-04-12 DIAGNOSIS — R278 Other lack of coordination: Secondary | ICD-10-CM | POA: Diagnosis not present

## 2014-04-12 DIAGNOSIS — R41841 Cognitive communication deficit: Secondary | ICD-10-CM | POA: Diagnosis not present

## 2014-04-12 DIAGNOSIS — M6281 Muscle weakness (generalized): Secondary | ICD-10-CM | POA: Diagnosis not present

## 2014-04-13 DIAGNOSIS — R293 Abnormal posture: Secondary | ICD-10-CM | POA: Diagnosis not present

## 2014-04-13 DIAGNOSIS — R278 Other lack of coordination: Secondary | ICD-10-CM | POA: Diagnosis not present

## 2014-04-13 DIAGNOSIS — R41841 Cognitive communication deficit: Secondary | ICD-10-CM | POA: Diagnosis not present

## 2014-04-13 DIAGNOSIS — I5040 Unspecified combined systolic (congestive) and diastolic (congestive) heart failure: Secondary | ICD-10-CM | POA: Diagnosis not present

## 2014-04-13 DIAGNOSIS — R2681 Unsteadiness on feet: Secondary | ICD-10-CM | POA: Diagnosis not present

## 2014-04-13 DIAGNOSIS — M6281 Muscle weakness (generalized): Secondary | ICD-10-CM | POA: Diagnosis not present

## 2014-04-14 DIAGNOSIS — I5040 Unspecified combined systolic (congestive) and diastolic (congestive) heart failure: Secondary | ICD-10-CM | POA: Diagnosis not present

## 2014-04-14 DIAGNOSIS — N189 Chronic kidney disease, unspecified: Secondary | ICD-10-CM | POA: Diagnosis not present

## 2014-04-14 DIAGNOSIS — N182 Chronic kidney disease, stage 2 (mild): Secondary | ICD-10-CM | POA: Diagnosis not present

## 2014-04-14 DIAGNOSIS — R41841 Cognitive communication deficit: Secondary | ICD-10-CM | POA: Diagnosis not present

## 2014-04-14 DIAGNOSIS — I482 Chronic atrial fibrillation: Secondary | ICD-10-CM | POA: Diagnosis not present

## 2014-04-14 DIAGNOSIS — Z79899 Other long term (current) drug therapy: Secondary | ICD-10-CM | POA: Diagnosis not present

## 2014-04-14 DIAGNOSIS — R293 Abnormal posture: Secondary | ICD-10-CM | POA: Diagnosis not present

## 2014-04-14 DIAGNOSIS — Z7901 Long term (current) use of anticoagulants: Secondary | ICD-10-CM | POA: Diagnosis not present

## 2014-04-14 DIAGNOSIS — R2681 Unsteadiness on feet: Secondary | ICD-10-CM | POA: Diagnosis not present

## 2014-04-14 DIAGNOSIS — D649 Anemia, unspecified: Secondary | ICD-10-CM | POA: Diagnosis not present

## 2014-04-14 DIAGNOSIS — M6281 Muscle weakness (generalized): Secondary | ICD-10-CM | POA: Diagnosis not present

## 2014-04-14 DIAGNOSIS — E875 Hyperkalemia: Secondary | ICD-10-CM | POA: Diagnosis not present

## 2014-04-14 DIAGNOSIS — D509 Iron deficiency anemia, unspecified: Secondary | ICD-10-CM | POA: Diagnosis not present

## 2014-04-14 DIAGNOSIS — R278 Other lack of coordination: Secondary | ICD-10-CM | POA: Diagnosis not present

## 2014-04-14 DIAGNOSIS — I480 Paroxysmal atrial fibrillation: Secondary | ICD-10-CM | POA: Diagnosis not present

## 2014-04-15 DIAGNOSIS — I5040 Unspecified combined systolic (congestive) and diastolic (congestive) heart failure: Secondary | ICD-10-CM | POA: Diagnosis not present

## 2014-04-15 DIAGNOSIS — M6281 Muscle weakness (generalized): Secondary | ICD-10-CM | POA: Diagnosis not present

## 2014-04-15 DIAGNOSIS — R293 Abnormal posture: Secondary | ICD-10-CM | POA: Diagnosis not present

## 2014-04-15 DIAGNOSIS — R2681 Unsteadiness on feet: Secondary | ICD-10-CM | POA: Diagnosis not present

## 2014-04-15 DIAGNOSIS — R278 Other lack of coordination: Secondary | ICD-10-CM | POA: Diagnosis not present

## 2014-04-15 DIAGNOSIS — R41841 Cognitive communication deficit: Secondary | ICD-10-CM | POA: Diagnosis not present

## 2014-04-16 DIAGNOSIS — I5040 Unspecified combined systolic (congestive) and diastolic (congestive) heart failure: Secondary | ICD-10-CM | POA: Diagnosis not present

## 2014-04-16 DIAGNOSIS — R278 Other lack of coordination: Secondary | ICD-10-CM | POA: Diagnosis not present

## 2014-04-16 DIAGNOSIS — R41841 Cognitive communication deficit: Secondary | ICD-10-CM | POA: Diagnosis not present

## 2014-04-16 DIAGNOSIS — M6281 Muscle weakness (generalized): Secondary | ICD-10-CM | POA: Diagnosis not present

## 2014-04-16 DIAGNOSIS — R293 Abnormal posture: Secondary | ICD-10-CM | POA: Diagnosis not present

## 2014-04-16 DIAGNOSIS — R2681 Unsteadiness on feet: Secondary | ICD-10-CM | POA: Diagnosis not present

## 2014-04-17 DIAGNOSIS — M6281 Muscle weakness (generalized): Secondary | ICD-10-CM | POA: Diagnosis not present

## 2014-04-17 DIAGNOSIS — R278 Other lack of coordination: Secondary | ICD-10-CM | POA: Diagnosis not present

## 2014-04-17 DIAGNOSIS — I5040 Unspecified combined systolic (congestive) and diastolic (congestive) heart failure: Secondary | ICD-10-CM | POA: Diagnosis not present

## 2014-04-17 DIAGNOSIS — Z7901 Long term (current) use of anticoagulants: Secondary | ICD-10-CM | POA: Diagnosis not present

## 2014-04-17 DIAGNOSIS — R293 Abnormal posture: Secondary | ICD-10-CM | POA: Diagnosis not present

## 2014-04-17 DIAGNOSIS — R2681 Unsteadiness on feet: Secondary | ICD-10-CM | POA: Diagnosis not present

## 2014-04-17 DIAGNOSIS — R41841 Cognitive communication deficit: Secondary | ICD-10-CM | POA: Diagnosis not present

## 2014-04-17 DIAGNOSIS — I480 Paroxysmal atrial fibrillation: Secondary | ICD-10-CM | POA: Diagnosis not present

## 2014-04-18 DIAGNOSIS — I482 Chronic atrial fibrillation: Secondary | ICD-10-CM | POA: Diagnosis not present

## 2014-04-18 DIAGNOSIS — M6281 Muscle weakness (generalized): Secondary | ICD-10-CM | POA: Diagnosis not present

## 2014-04-18 DIAGNOSIS — R278 Other lack of coordination: Secondary | ICD-10-CM | POA: Diagnosis not present

## 2014-04-18 DIAGNOSIS — R293 Abnormal posture: Secondary | ICD-10-CM | POA: Diagnosis not present

## 2014-04-18 DIAGNOSIS — I5040 Unspecified combined systolic (congestive) and diastolic (congestive) heart failure: Secondary | ICD-10-CM | POA: Diagnosis not present

## 2014-04-18 DIAGNOSIS — N182 Chronic kidney disease, stage 2 (mild): Secondary | ICD-10-CM | POA: Diagnosis not present

## 2014-04-18 DIAGNOSIS — R2681 Unsteadiness on feet: Secondary | ICD-10-CM | POA: Diagnosis not present

## 2014-04-18 DIAGNOSIS — D649 Anemia, unspecified: Secondary | ICD-10-CM | POA: Diagnosis not present

## 2014-04-18 DIAGNOSIS — D509 Iron deficiency anemia, unspecified: Secondary | ICD-10-CM | POA: Diagnosis not present

## 2014-04-18 DIAGNOSIS — Z79899 Other long term (current) drug therapy: Secondary | ICD-10-CM | POA: Diagnosis not present

## 2014-04-18 DIAGNOSIS — R41841 Cognitive communication deficit: Secondary | ICD-10-CM | POA: Diagnosis not present

## 2014-04-20 DIAGNOSIS — I480 Paroxysmal atrial fibrillation: Secondary | ICD-10-CM | POA: Diagnosis not present

## 2014-04-20 DIAGNOSIS — Z7901 Long term (current) use of anticoagulants: Secondary | ICD-10-CM | POA: Diagnosis not present

## 2014-04-21 DIAGNOSIS — R293 Abnormal posture: Secondary | ICD-10-CM | POA: Diagnosis not present

## 2014-04-21 DIAGNOSIS — I5040 Unspecified combined systolic (congestive) and diastolic (congestive) heart failure: Secondary | ICD-10-CM | POA: Diagnosis not present

## 2014-04-21 DIAGNOSIS — R41841 Cognitive communication deficit: Secondary | ICD-10-CM | POA: Diagnosis not present

## 2014-04-21 DIAGNOSIS — R278 Other lack of coordination: Secondary | ICD-10-CM | POA: Diagnosis not present

## 2014-04-21 DIAGNOSIS — R2681 Unsteadiness on feet: Secondary | ICD-10-CM | POA: Diagnosis not present

## 2014-04-21 DIAGNOSIS — M6281 Muscle weakness (generalized): Secondary | ICD-10-CM | POA: Diagnosis not present

## 2014-04-22 DIAGNOSIS — R278 Other lack of coordination: Secondary | ICD-10-CM | POA: Diagnosis not present

## 2014-04-22 DIAGNOSIS — M6281 Muscle weakness (generalized): Secondary | ICD-10-CM | POA: Diagnosis not present

## 2014-04-22 DIAGNOSIS — I5040 Unspecified combined systolic (congestive) and diastolic (congestive) heart failure: Secondary | ICD-10-CM | POA: Diagnosis not present

## 2014-04-22 DIAGNOSIS — R41841 Cognitive communication deficit: Secondary | ICD-10-CM | POA: Diagnosis not present

## 2014-04-22 DIAGNOSIS — R2681 Unsteadiness on feet: Secondary | ICD-10-CM | POA: Diagnosis not present

## 2014-04-22 DIAGNOSIS — R293 Abnormal posture: Secondary | ICD-10-CM | POA: Diagnosis not present

## 2014-04-23 DIAGNOSIS — R278 Other lack of coordination: Secondary | ICD-10-CM | POA: Diagnosis not present

## 2014-04-23 DIAGNOSIS — N182 Chronic kidney disease, stage 2 (mild): Secondary | ICD-10-CM | POA: Diagnosis not present

## 2014-04-23 DIAGNOSIS — D649 Anemia, unspecified: Secondary | ICD-10-CM | POA: Diagnosis not present

## 2014-04-23 DIAGNOSIS — R41841 Cognitive communication deficit: Secondary | ICD-10-CM | POA: Diagnosis not present

## 2014-04-23 DIAGNOSIS — D509 Iron deficiency anemia, unspecified: Secondary | ICD-10-CM | POA: Diagnosis not present

## 2014-04-23 DIAGNOSIS — Z79899 Other long term (current) drug therapy: Secondary | ICD-10-CM | POA: Diagnosis not present

## 2014-04-23 DIAGNOSIS — R2681 Unsteadiness on feet: Secondary | ICD-10-CM | POA: Diagnosis not present

## 2014-04-23 DIAGNOSIS — I5040 Unspecified combined systolic (congestive) and diastolic (congestive) heart failure: Secondary | ICD-10-CM | POA: Diagnosis not present

## 2014-04-23 DIAGNOSIS — R293 Abnormal posture: Secondary | ICD-10-CM | POA: Diagnosis not present

## 2014-04-23 DIAGNOSIS — Z7901 Long term (current) use of anticoagulants: Secondary | ICD-10-CM | POA: Diagnosis not present

## 2014-04-23 DIAGNOSIS — M6281 Muscle weakness (generalized): Secondary | ICD-10-CM | POA: Diagnosis not present

## 2014-04-23 DIAGNOSIS — I482 Chronic atrial fibrillation: Secondary | ICD-10-CM | POA: Diagnosis not present

## 2014-04-24 DIAGNOSIS — M6281 Muscle weakness (generalized): Secondary | ICD-10-CM | POA: Diagnosis not present

## 2014-04-24 DIAGNOSIS — R41841 Cognitive communication deficit: Secondary | ICD-10-CM | POA: Diagnosis not present

## 2014-04-24 DIAGNOSIS — I5040 Unspecified combined systolic (congestive) and diastolic (congestive) heart failure: Secondary | ICD-10-CM | POA: Diagnosis not present

## 2014-04-24 DIAGNOSIS — R278 Other lack of coordination: Secondary | ICD-10-CM | POA: Diagnosis not present

## 2014-04-24 DIAGNOSIS — R2681 Unsteadiness on feet: Secondary | ICD-10-CM | POA: Diagnosis not present

## 2014-04-24 DIAGNOSIS — R293 Abnormal posture: Secondary | ICD-10-CM | POA: Diagnosis not present

## 2014-04-25 DIAGNOSIS — M6281 Muscle weakness (generalized): Secondary | ICD-10-CM | POA: Diagnosis not present

## 2014-04-25 DIAGNOSIS — R278 Other lack of coordination: Secondary | ICD-10-CM | POA: Diagnosis not present

## 2014-04-25 DIAGNOSIS — R293 Abnormal posture: Secondary | ICD-10-CM | POA: Diagnosis not present

## 2014-04-25 DIAGNOSIS — R41841 Cognitive communication deficit: Secondary | ICD-10-CM | POA: Diagnosis not present

## 2014-04-25 DIAGNOSIS — I5040 Unspecified combined systolic (congestive) and diastolic (congestive) heart failure: Secondary | ICD-10-CM | POA: Diagnosis not present

## 2014-04-25 DIAGNOSIS — R2681 Unsteadiness on feet: Secondary | ICD-10-CM | POA: Diagnosis not present

## 2014-04-26 DIAGNOSIS — I480 Paroxysmal atrial fibrillation: Secondary | ICD-10-CM | POA: Diagnosis not present

## 2014-04-26 DIAGNOSIS — I5040 Unspecified combined systolic (congestive) and diastolic (congestive) heart failure: Secondary | ICD-10-CM | POA: Diagnosis not present

## 2014-04-26 DIAGNOSIS — Z7901 Long term (current) use of anticoagulants: Secondary | ICD-10-CM | POA: Diagnosis not present

## 2014-04-26 DIAGNOSIS — I69959 Hemiplegia and hemiparesis following unspecified cerebrovascular disease affecting unspecified side: Secondary | ICD-10-CM | POA: Diagnosis not present

## 2014-04-27 DIAGNOSIS — R41841 Cognitive communication deficit: Secondary | ICD-10-CM | POA: Diagnosis not present

## 2014-04-27 DIAGNOSIS — M6281 Muscle weakness (generalized): Secondary | ICD-10-CM | POA: Diagnosis not present

## 2014-04-27 DIAGNOSIS — R293 Abnormal posture: Secondary | ICD-10-CM | POA: Diagnosis not present

## 2014-04-27 DIAGNOSIS — I5040 Unspecified combined systolic (congestive) and diastolic (congestive) heart failure: Secondary | ICD-10-CM | POA: Diagnosis not present

## 2014-04-27 DIAGNOSIS — R278 Other lack of coordination: Secondary | ICD-10-CM | POA: Diagnosis not present

## 2014-04-27 DIAGNOSIS — R2681 Unsteadiness on feet: Secondary | ICD-10-CM | POA: Diagnosis not present

## 2014-04-28 DIAGNOSIS — R293 Abnormal posture: Secondary | ICD-10-CM | POA: Diagnosis not present

## 2014-04-28 DIAGNOSIS — I5040 Unspecified combined systolic (congestive) and diastolic (congestive) heart failure: Secondary | ICD-10-CM | POA: Diagnosis not present

## 2014-04-28 DIAGNOSIS — M6281 Muscle weakness (generalized): Secondary | ICD-10-CM | POA: Diagnosis not present

## 2014-04-28 DIAGNOSIS — R2681 Unsteadiness on feet: Secondary | ICD-10-CM | POA: Diagnosis not present

## 2014-04-28 DIAGNOSIS — R41841 Cognitive communication deficit: Secondary | ICD-10-CM | POA: Diagnosis not present

## 2014-04-28 DIAGNOSIS — R278 Other lack of coordination: Secondary | ICD-10-CM | POA: Diagnosis not present

## 2014-04-29 DIAGNOSIS — Z79899 Other long term (current) drug therapy: Secondary | ICD-10-CM | POA: Diagnosis not present

## 2014-04-29 DIAGNOSIS — I480 Paroxysmal atrial fibrillation: Secondary | ICD-10-CM | POA: Diagnosis not present

## 2014-04-29 DIAGNOSIS — Z7901 Long term (current) use of anticoagulants: Secondary | ICD-10-CM | POA: Diagnosis not present

## 2014-04-29 DIAGNOSIS — I482 Chronic atrial fibrillation: Secondary | ICD-10-CM | POA: Diagnosis not present

## 2014-04-29 DIAGNOSIS — M6281 Muscle weakness (generalized): Secondary | ICD-10-CM | POA: Diagnosis not present

## 2014-04-29 DIAGNOSIS — N182 Chronic kidney disease, stage 2 (mild): Secondary | ICD-10-CM | POA: Diagnosis not present

## 2014-04-29 DIAGNOSIS — R2681 Unsteadiness on feet: Secondary | ICD-10-CM | POA: Diagnosis not present

## 2014-04-29 DIAGNOSIS — D509 Iron deficiency anemia, unspecified: Secondary | ICD-10-CM | POA: Diagnosis not present

## 2014-04-29 DIAGNOSIS — N189 Chronic kidney disease, unspecified: Secondary | ICD-10-CM | POA: Diagnosis not present

## 2014-04-29 DIAGNOSIS — E875 Hyperkalemia: Secondary | ICD-10-CM | POA: Diagnosis not present

## 2014-04-29 DIAGNOSIS — D649 Anemia, unspecified: Secondary | ICD-10-CM | POA: Diagnosis not present

## 2014-04-29 DIAGNOSIS — R41841 Cognitive communication deficit: Secondary | ICD-10-CM | POA: Diagnosis not present

## 2014-04-29 DIAGNOSIS — R293 Abnormal posture: Secondary | ICD-10-CM | POA: Diagnosis not present

## 2014-04-29 DIAGNOSIS — I5040 Unspecified combined systolic (congestive) and diastolic (congestive) heart failure: Secondary | ICD-10-CM | POA: Diagnosis not present

## 2014-04-29 DIAGNOSIS — R278 Other lack of coordination: Secondary | ICD-10-CM | POA: Diagnosis not present

## 2014-04-30 DIAGNOSIS — M6281 Muscle weakness (generalized): Secondary | ICD-10-CM | POA: Diagnosis not present

## 2014-04-30 DIAGNOSIS — R278 Other lack of coordination: Secondary | ICD-10-CM | POA: Diagnosis not present

## 2014-04-30 DIAGNOSIS — R293 Abnormal posture: Secondary | ICD-10-CM | POA: Diagnosis not present

## 2014-04-30 DIAGNOSIS — R41841 Cognitive communication deficit: Secondary | ICD-10-CM | POA: Diagnosis not present

## 2014-04-30 DIAGNOSIS — L89620 Pressure ulcer of left heel, unstageable: Secondary | ICD-10-CM | POA: Diagnosis not present

## 2014-04-30 DIAGNOSIS — R2681 Unsteadiness on feet: Secondary | ICD-10-CM | POA: Diagnosis not present

## 2014-04-30 DIAGNOSIS — I5040 Unspecified combined systolic (congestive) and diastolic (congestive) heart failure: Secondary | ICD-10-CM | POA: Diagnosis not present

## 2014-05-01 DIAGNOSIS — I5040 Unspecified combined systolic (congestive) and diastolic (congestive) heart failure: Secondary | ICD-10-CM | POA: Diagnosis not present

## 2014-05-01 DIAGNOSIS — R278 Other lack of coordination: Secondary | ICD-10-CM | POA: Diagnosis not present

## 2014-05-01 DIAGNOSIS — R293 Abnormal posture: Secondary | ICD-10-CM | POA: Diagnosis not present

## 2014-05-01 DIAGNOSIS — R2681 Unsteadiness on feet: Secondary | ICD-10-CM | POA: Diagnosis not present

## 2014-05-01 DIAGNOSIS — R41841 Cognitive communication deficit: Secondary | ICD-10-CM | POA: Diagnosis not present

## 2014-05-01 DIAGNOSIS — M6281 Muscle weakness (generalized): Secondary | ICD-10-CM | POA: Diagnosis not present

## 2014-05-02 DIAGNOSIS — R41841 Cognitive communication deficit: Secondary | ICD-10-CM | POA: Diagnosis not present

## 2014-05-02 DIAGNOSIS — N189 Chronic kidney disease, unspecified: Secondary | ICD-10-CM | POA: Diagnosis not present

## 2014-05-02 DIAGNOSIS — I5040 Unspecified combined systolic (congestive) and diastolic (congestive) heart failure: Secondary | ICD-10-CM | POA: Diagnosis not present

## 2014-05-02 DIAGNOSIS — D509 Iron deficiency anemia, unspecified: Secondary | ICD-10-CM | POA: Diagnosis not present

## 2014-05-02 DIAGNOSIS — D649 Anemia, unspecified: Secondary | ICD-10-CM | POA: Diagnosis not present

## 2014-05-02 DIAGNOSIS — E875 Hyperkalemia: Secondary | ICD-10-CM | POA: Diagnosis not present

## 2014-05-02 DIAGNOSIS — Z79899 Other long term (current) drug therapy: Secondary | ICD-10-CM | POA: Diagnosis not present

## 2014-05-02 DIAGNOSIS — Z7901 Long term (current) use of anticoagulants: Secondary | ICD-10-CM | POA: Diagnosis not present

## 2014-05-02 DIAGNOSIS — I482 Chronic atrial fibrillation: Secondary | ICD-10-CM | POA: Diagnosis not present

## 2014-05-02 DIAGNOSIS — I480 Paroxysmal atrial fibrillation: Secondary | ICD-10-CM | POA: Diagnosis not present

## 2014-05-02 DIAGNOSIS — N182 Chronic kidney disease, stage 2 (mild): Secondary | ICD-10-CM | POA: Diagnosis not present

## 2014-05-02 DIAGNOSIS — M6281 Muscle weakness (generalized): Secondary | ICD-10-CM | POA: Diagnosis not present

## 2014-05-02 DIAGNOSIS — I69959 Hemiplegia and hemiparesis following unspecified cerebrovascular disease affecting unspecified side: Secondary | ICD-10-CM | POA: Diagnosis not present

## 2014-05-02 DIAGNOSIS — R293 Abnormal posture: Secondary | ICD-10-CM | POA: Diagnosis not present

## 2014-05-02 DIAGNOSIS — R2681 Unsteadiness on feet: Secondary | ICD-10-CM | POA: Diagnosis not present

## 2014-05-02 DIAGNOSIS — R278 Other lack of coordination: Secondary | ICD-10-CM | POA: Diagnosis not present

## 2014-05-03 DIAGNOSIS — M6281 Muscle weakness (generalized): Secondary | ICD-10-CM | POA: Diagnosis not present

## 2014-05-03 DIAGNOSIS — I5040 Unspecified combined systolic (congestive) and diastolic (congestive) heart failure: Secondary | ICD-10-CM | POA: Diagnosis not present

## 2014-05-03 DIAGNOSIS — R2681 Unsteadiness on feet: Secondary | ICD-10-CM | POA: Diagnosis not present

## 2014-05-03 DIAGNOSIS — R41841 Cognitive communication deficit: Secondary | ICD-10-CM | POA: Diagnosis not present

## 2014-05-03 DIAGNOSIS — R293 Abnormal posture: Secondary | ICD-10-CM | POA: Diagnosis not present

## 2014-05-03 DIAGNOSIS — R278 Other lack of coordination: Secondary | ICD-10-CM | POA: Diagnosis not present

## 2014-05-04 DIAGNOSIS — R41841 Cognitive communication deficit: Secondary | ICD-10-CM | POA: Diagnosis not present

## 2014-05-04 DIAGNOSIS — I5040 Unspecified combined systolic (congestive) and diastolic (congestive) heart failure: Secondary | ICD-10-CM | POA: Diagnosis not present

## 2014-05-04 DIAGNOSIS — R293 Abnormal posture: Secondary | ICD-10-CM | POA: Diagnosis not present

## 2014-05-04 DIAGNOSIS — R278 Other lack of coordination: Secondary | ICD-10-CM | POA: Diagnosis not present

## 2014-05-04 DIAGNOSIS — M6281 Muscle weakness (generalized): Secondary | ICD-10-CM | POA: Diagnosis not present

## 2014-05-04 DIAGNOSIS — R2681 Unsteadiness on feet: Secondary | ICD-10-CM | POA: Diagnosis not present

## 2014-05-05 DIAGNOSIS — Z7901 Long term (current) use of anticoagulants: Secondary | ICD-10-CM | POA: Diagnosis not present

## 2014-05-05 DIAGNOSIS — M6281 Muscle weakness (generalized): Secondary | ICD-10-CM | POA: Diagnosis not present

## 2014-05-05 DIAGNOSIS — R2681 Unsteadiness on feet: Secondary | ICD-10-CM | POA: Diagnosis not present

## 2014-05-05 DIAGNOSIS — R41841 Cognitive communication deficit: Secondary | ICD-10-CM | POA: Diagnosis not present

## 2014-05-05 DIAGNOSIS — I480 Paroxysmal atrial fibrillation: Secondary | ICD-10-CM | POA: Diagnosis not present

## 2014-05-05 DIAGNOSIS — I5040 Unspecified combined systolic (congestive) and diastolic (congestive) heart failure: Secondary | ICD-10-CM | POA: Diagnosis not present

## 2014-05-05 DIAGNOSIS — R278 Other lack of coordination: Secondary | ICD-10-CM | POA: Diagnosis not present

## 2014-05-05 DIAGNOSIS — I69959 Hemiplegia and hemiparesis following unspecified cerebrovascular disease affecting unspecified side: Secondary | ICD-10-CM | POA: Diagnosis not present

## 2014-05-05 DIAGNOSIS — R293 Abnormal posture: Secondary | ICD-10-CM | POA: Diagnosis not present

## 2014-05-06 DIAGNOSIS — R293 Abnormal posture: Secondary | ICD-10-CM | POA: Diagnosis not present

## 2014-05-06 DIAGNOSIS — N182 Chronic kidney disease, stage 2 (mild): Secondary | ICD-10-CM | POA: Diagnosis not present

## 2014-05-06 DIAGNOSIS — D649 Anemia, unspecified: Secondary | ICD-10-CM | POA: Diagnosis not present

## 2014-05-06 DIAGNOSIS — M6281 Muscle weakness (generalized): Secondary | ICD-10-CM | POA: Diagnosis not present

## 2014-05-06 DIAGNOSIS — E875 Hyperkalemia: Secondary | ICD-10-CM | POA: Diagnosis not present

## 2014-05-06 DIAGNOSIS — R41841 Cognitive communication deficit: Secondary | ICD-10-CM | POA: Diagnosis not present

## 2014-05-06 DIAGNOSIS — I5040 Unspecified combined systolic (congestive) and diastolic (congestive) heart failure: Secondary | ICD-10-CM | POA: Diagnosis not present

## 2014-05-06 DIAGNOSIS — R2681 Unsteadiness on feet: Secondary | ICD-10-CM | POA: Diagnosis not present

## 2014-05-06 DIAGNOSIS — R278 Other lack of coordination: Secondary | ICD-10-CM | POA: Diagnosis not present

## 2014-05-06 DIAGNOSIS — D509 Iron deficiency anemia, unspecified: Secondary | ICD-10-CM | POA: Diagnosis not present

## 2014-05-06 DIAGNOSIS — Z79899 Other long term (current) drug therapy: Secondary | ICD-10-CM | POA: Diagnosis not present

## 2014-05-06 DIAGNOSIS — I480 Paroxysmal atrial fibrillation: Secondary | ICD-10-CM | POA: Diagnosis not present

## 2014-05-06 DIAGNOSIS — I5022 Chronic systolic (congestive) heart failure: Secondary | ICD-10-CM | POA: Diagnosis not present

## 2014-05-06 DIAGNOSIS — I69959 Hemiplegia and hemiparesis following unspecified cerebrovascular disease affecting unspecified side: Secondary | ICD-10-CM | POA: Diagnosis not present

## 2014-05-06 DIAGNOSIS — N189 Chronic kidney disease, unspecified: Secondary | ICD-10-CM | POA: Diagnosis not present

## 2014-05-07 DIAGNOSIS — R2681 Unsteadiness on feet: Secondary | ICD-10-CM | POA: Diagnosis not present

## 2014-05-07 DIAGNOSIS — I5040 Unspecified combined systolic (congestive) and diastolic (congestive) heart failure: Secondary | ICD-10-CM | POA: Diagnosis not present

## 2014-05-07 DIAGNOSIS — R41841 Cognitive communication deficit: Secondary | ICD-10-CM | POA: Diagnosis not present

## 2014-05-07 DIAGNOSIS — M6281 Muscle weakness (generalized): Secondary | ICD-10-CM | POA: Diagnosis not present

## 2014-05-07 DIAGNOSIS — L853 Xerosis cutis: Secondary | ICD-10-CM | POA: Diagnosis not present

## 2014-05-07 DIAGNOSIS — R278 Other lack of coordination: Secondary | ICD-10-CM | POA: Diagnosis not present

## 2014-05-07 DIAGNOSIS — R293 Abnormal posture: Secondary | ICD-10-CM | POA: Diagnosis not present

## 2014-05-08 DIAGNOSIS — R293 Abnormal posture: Secondary | ICD-10-CM | POA: Diagnosis not present

## 2014-05-08 DIAGNOSIS — I5040 Unspecified combined systolic (congestive) and diastolic (congestive) heart failure: Secondary | ICD-10-CM | POA: Diagnosis not present

## 2014-05-08 DIAGNOSIS — R278 Other lack of coordination: Secondary | ICD-10-CM | POA: Diagnosis not present

## 2014-05-08 DIAGNOSIS — R2681 Unsteadiness on feet: Secondary | ICD-10-CM | POA: Diagnosis not present

## 2014-05-08 DIAGNOSIS — R41841 Cognitive communication deficit: Secondary | ICD-10-CM | POA: Diagnosis not present

## 2014-05-08 DIAGNOSIS — M6281 Muscle weakness (generalized): Secondary | ICD-10-CM | POA: Diagnosis not present

## 2014-05-09 DIAGNOSIS — I4891 Unspecified atrial fibrillation: Secondary | ICD-10-CM | POA: Diagnosis not present

## 2014-05-09 DIAGNOSIS — I480 Paroxysmal atrial fibrillation: Secondary | ICD-10-CM | POA: Diagnosis not present

## 2014-05-09 DIAGNOSIS — Z7901 Long term (current) use of anticoagulants: Secondary | ICD-10-CM | POA: Diagnosis not present

## 2014-05-09 DIAGNOSIS — N182 Chronic kidney disease, stage 2 (mild): Secondary | ICD-10-CM | POA: Diagnosis not present

## 2014-05-09 DIAGNOSIS — E875 Hyperkalemia: Secondary | ICD-10-CM | POA: Diagnosis not present

## 2014-05-09 DIAGNOSIS — M6281 Muscle weakness (generalized): Secondary | ICD-10-CM | POA: Diagnosis not present

## 2014-05-09 DIAGNOSIS — I69959 Hemiplegia and hemiparesis following unspecified cerebrovascular disease affecting unspecified side: Secondary | ICD-10-CM | POA: Diagnosis not present

## 2014-05-09 DIAGNOSIS — D509 Iron deficiency anemia, unspecified: Secondary | ICD-10-CM | POA: Diagnosis not present

## 2014-05-09 DIAGNOSIS — I5040 Unspecified combined systolic (congestive) and diastolic (congestive) heart failure: Secondary | ICD-10-CM | POA: Diagnosis not present

## 2014-05-09 DIAGNOSIS — R278 Other lack of coordination: Secondary | ICD-10-CM | POA: Diagnosis not present

## 2014-05-09 DIAGNOSIS — N189 Chronic kidney disease, unspecified: Secondary | ICD-10-CM | POA: Diagnosis not present

## 2014-05-09 DIAGNOSIS — R2681 Unsteadiness on feet: Secondary | ICD-10-CM | POA: Diagnosis not present

## 2014-05-09 DIAGNOSIS — R41841 Cognitive communication deficit: Secondary | ICD-10-CM | POA: Diagnosis not present

## 2014-05-09 DIAGNOSIS — D649 Anemia, unspecified: Secondary | ICD-10-CM | POA: Diagnosis not present

## 2014-05-09 DIAGNOSIS — Z79899 Other long term (current) drug therapy: Secondary | ICD-10-CM | POA: Diagnosis not present

## 2014-05-09 DIAGNOSIS — R293 Abnormal posture: Secondary | ICD-10-CM | POA: Diagnosis not present

## 2014-05-09 DIAGNOSIS — I482 Chronic atrial fibrillation: Secondary | ICD-10-CM | POA: Diagnosis not present

## 2014-05-10 DIAGNOSIS — I5040 Unspecified combined systolic (congestive) and diastolic (congestive) heart failure: Secondary | ICD-10-CM | POA: Diagnosis not present

## 2014-05-10 DIAGNOSIS — M6281 Muscle weakness (generalized): Secondary | ICD-10-CM | POA: Diagnosis not present

## 2014-05-10 DIAGNOSIS — R278 Other lack of coordination: Secondary | ICD-10-CM | POA: Diagnosis not present

## 2014-05-10 DIAGNOSIS — R41841 Cognitive communication deficit: Secondary | ICD-10-CM | POA: Diagnosis not present

## 2014-05-10 DIAGNOSIS — R2681 Unsteadiness on feet: Secondary | ICD-10-CM | POA: Diagnosis not present

## 2014-05-10 DIAGNOSIS — R293 Abnormal posture: Secondary | ICD-10-CM | POA: Diagnosis not present

## 2014-05-11 DIAGNOSIS — I5040 Unspecified combined systolic (congestive) and diastolic (congestive) heart failure: Secondary | ICD-10-CM | POA: Diagnosis not present

## 2014-05-11 DIAGNOSIS — M6281 Muscle weakness (generalized): Secondary | ICD-10-CM | POA: Diagnosis not present

## 2014-05-11 DIAGNOSIS — R278 Other lack of coordination: Secondary | ICD-10-CM | POA: Diagnosis not present

## 2014-05-11 DIAGNOSIS — R293 Abnormal posture: Secondary | ICD-10-CM | POA: Diagnosis not present

## 2014-05-11 DIAGNOSIS — R41841 Cognitive communication deficit: Secondary | ICD-10-CM | POA: Diagnosis not present

## 2014-05-11 DIAGNOSIS — R2681 Unsteadiness on feet: Secondary | ICD-10-CM | POA: Diagnosis not present

## 2014-05-12 DIAGNOSIS — Z7901 Long term (current) use of anticoagulants: Secondary | ICD-10-CM | POA: Diagnosis not present

## 2014-05-12 DIAGNOSIS — R278 Other lack of coordination: Secondary | ICD-10-CM | POA: Diagnosis not present

## 2014-05-12 DIAGNOSIS — I480 Paroxysmal atrial fibrillation: Secondary | ICD-10-CM | POA: Diagnosis not present

## 2014-05-12 DIAGNOSIS — R41841 Cognitive communication deficit: Secondary | ICD-10-CM | POA: Diagnosis not present

## 2014-05-12 DIAGNOSIS — R2681 Unsteadiness on feet: Secondary | ICD-10-CM | POA: Diagnosis not present

## 2014-05-12 DIAGNOSIS — E875 Hyperkalemia: Secondary | ICD-10-CM | POA: Diagnosis not present

## 2014-05-12 DIAGNOSIS — R293 Abnormal posture: Secondary | ICD-10-CM | POA: Diagnosis not present

## 2014-05-12 DIAGNOSIS — I69959 Hemiplegia and hemiparesis following unspecified cerebrovascular disease affecting unspecified side: Secondary | ICD-10-CM | POA: Diagnosis not present

## 2014-05-12 DIAGNOSIS — I5040 Unspecified combined systolic (congestive) and diastolic (congestive) heart failure: Secondary | ICD-10-CM | POA: Diagnosis not present

## 2014-05-12 DIAGNOSIS — I482 Chronic atrial fibrillation: Secondary | ICD-10-CM | POA: Diagnosis not present

## 2014-05-12 DIAGNOSIS — M6281 Muscle weakness (generalized): Secondary | ICD-10-CM | POA: Diagnosis not present

## 2014-05-13 DIAGNOSIS — R278 Other lack of coordination: Secondary | ICD-10-CM | POA: Diagnosis not present

## 2014-05-13 DIAGNOSIS — M6281 Muscle weakness (generalized): Secondary | ICD-10-CM | POA: Diagnosis not present

## 2014-05-13 DIAGNOSIS — R293 Abnormal posture: Secondary | ICD-10-CM | POA: Diagnosis not present

## 2014-05-13 DIAGNOSIS — R41841 Cognitive communication deficit: Secondary | ICD-10-CM | POA: Diagnosis not present

## 2014-05-13 DIAGNOSIS — I5040 Unspecified combined systolic (congestive) and diastolic (congestive) heart failure: Secondary | ICD-10-CM | POA: Diagnosis not present

## 2014-05-13 DIAGNOSIS — R2681 Unsteadiness on feet: Secondary | ICD-10-CM | POA: Diagnosis not present

## 2014-05-14 DIAGNOSIS — R41841 Cognitive communication deficit: Secondary | ICD-10-CM | POA: Diagnosis not present

## 2014-05-14 DIAGNOSIS — L89623 Pressure ulcer of left heel, stage 3: Secondary | ICD-10-CM | POA: Diagnosis not present

## 2014-05-14 DIAGNOSIS — D509 Iron deficiency anemia, unspecified: Secondary | ICD-10-CM | POA: Diagnosis not present

## 2014-05-14 DIAGNOSIS — N182 Chronic kidney disease, stage 2 (mild): Secondary | ICD-10-CM | POA: Diagnosis not present

## 2014-05-14 DIAGNOSIS — R293 Abnormal posture: Secondary | ICD-10-CM | POA: Diagnosis not present

## 2014-05-14 DIAGNOSIS — M6281 Muscle weakness (generalized): Secondary | ICD-10-CM | POA: Diagnosis not present

## 2014-05-14 DIAGNOSIS — I5040 Unspecified combined systolic (congestive) and diastolic (congestive) heart failure: Secondary | ICD-10-CM | POA: Diagnosis not present

## 2014-05-14 DIAGNOSIS — R2681 Unsteadiness on feet: Secondary | ICD-10-CM | POA: Diagnosis not present

## 2014-05-14 DIAGNOSIS — R278 Other lack of coordination: Secondary | ICD-10-CM | POA: Diagnosis not present

## 2014-05-15 DIAGNOSIS — Z7901 Long term (current) use of anticoagulants: Secondary | ICD-10-CM | POA: Diagnosis not present

## 2014-05-15 DIAGNOSIS — M6281 Muscle weakness (generalized): Secondary | ICD-10-CM | POA: Diagnosis not present

## 2014-05-15 DIAGNOSIS — R293 Abnormal posture: Secondary | ICD-10-CM | POA: Diagnosis not present

## 2014-05-15 DIAGNOSIS — I5040 Unspecified combined systolic (congestive) and diastolic (congestive) heart failure: Secondary | ICD-10-CM | POA: Diagnosis not present

## 2014-05-15 DIAGNOSIS — R2681 Unsteadiness on feet: Secondary | ICD-10-CM | POA: Diagnosis not present

## 2014-05-15 DIAGNOSIS — R278 Other lack of coordination: Secondary | ICD-10-CM | POA: Diagnosis not present

## 2014-05-15 DIAGNOSIS — R41841 Cognitive communication deficit: Secondary | ICD-10-CM | POA: Diagnosis not present

## 2014-05-15 DIAGNOSIS — I480 Paroxysmal atrial fibrillation: Secondary | ICD-10-CM | POA: Diagnosis not present

## 2014-05-15 DIAGNOSIS — I69959 Hemiplegia and hemiparesis following unspecified cerebrovascular disease affecting unspecified side: Secondary | ICD-10-CM | POA: Diagnosis not present

## 2014-05-16 DIAGNOSIS — M6281 Muscle weakness (generalized): Secondary | ICD-10-CM | POA: Diagnosis not present

## 2014-05-16 DIAGNOSIS — R41841 Cognitive communication deficit: Secondary | ICD-10-CM | POA: Diagnosis not present

## 2014-05-16 DIAGNOSIS — R2681 Unsteadiness on feet: Secondary | ICD-10-CM | POA: Diagnosis not present

## 2014-05-16 DIAGNOSIS — R293 Abnormal posture: Secondary | ICD-10-CM | POA: Diagnosis not present

## 2014-05-16 DIAGNOSIS — I5040 Unspecified combined systolic (congestive) and diastolic (congestive) heart failure: Secondary | ICD-10-CM | POA: Diagnosis not present

## 2014-05-16 DIAGNOSIS — R278 Other lack of coordination: Secondary | ICD-10-CM | POA: Diagnosis not present

## 2014-05-19 DIAGNOSIS — Z79899 Other long term (current) drug therapy: Secondary | ICD-10-CM | POA: Diagnosis not present

## 2014-05-19 DIAGNOSIS — I5040 Unspecified combined systolic (congestive) and diastolic (congestive) heart failure: Secondary | ICD-10-CM | POA: Diagnosis not present

## 2014-05-19 DIAGNOSIS — R2681 Unsteadiness on feet: Secondary | ICD-10-CM | POA: Diagnosis not present

## 2014-05-19 DIAGNOSIS — I480 Paroxysmal atrial fibrillation: Secondary | ICD-10-CM | POA: Diagnosis not present

## 2014-05-19 DIAGNOSIS — I482 Chronic atrial fibrillation: Secondary | ICD-10-CM | POA: Diagnosis not present

## 2014-05-19 DIAGNOSIS — I69959 Hemiplegia and hemiparesis following unspecified cerebrovascular disease affecting unspecified side: Secondary | ICD-10-CM | POA: Diagnosis not present

## 2014-05-19 DIAGNOSIS — D509 Iron deficiency anemia, unspecified: Secondary | ICD-10-CM | POA: Diagnosis not present

## 2014-05-19 DIAGNOSIS — R293 Abnormal posture: Secondary | ICD-10-CM | POA: Diagnosis not present

## 2014-05-19 DIAGNOSIS — Z7901 Long term (current) use of anticoagulants: Secondary | ICD-10-CM | POA: Diagnosis not present

## 2014-05-19 DIAGNOSIS — M6281 Muscle weakness (generalized): Secondary | ICD-10-CM | POA: Diagnosis not present

## 2014-05-19 DIAGNOSIS — R41841 Cognitive communication deficit: Secondary | ICD-10-CM | POA: Diagnosis not present

## 2014-05-19 DIAGNOSIS — D649 Anemia, unspecified: Secondary | ICD-10-CM | POA: Diagnosis not present

## 2014-05-19 DIAGNOSIS — R278 Other lack of coordination: Secondary | ICD-10-CM | POA: Diagnosis not present

## 2014-05-20 DIAGNOSIS — R278 Other lack of coordination: Secondary | ICD-10-CM | POA: Diagnosis not present

## 2014-05-20 DIAGNOSIS — I5040 Unspecified combined systolic (congestive) and diastolic (congestive) heart failure: Secondary | ICD-10-CM | POA: Diagnosis not present

## 2014-05-20 DIAGNOSIS — M6281 Muscle weakness (generalized): Secondary | ICD-10-CM | POA: Diagnosis not present

## 2014-05-20 DIAGNOSIS — R293 Abnormal posture: Secondary | ICD-10-CM | POA: Diagnosis not present

## 2014-05-20 DIAGNOSIS — R41841 Cognitive communication deficit: Secondary | ICD-10-CM | POA: Diagnosis not present

## 2014-05-20 DIAGNOSIS — R2681 Unsteadiness on feet: Secondary | ICD-10-CM | POA: Diagnosis not present

## 2014-05-21 DIAGNOSIS — R278 Other lack of coordination: Secondary | ICD-10-CM | POA: Diagnosis not present

## 2014-05-21 DIAGNOSIS — L89623 Pressure ulcer of left heel, stage 3: Secondary | ICD-10-CM | POA: Diagnosis not present

## 2014-05-21 DIAGNOSIS — R2681 Unsteadiness on feet: Secondary | ICD-10-CM | POA: Diagnosis not present

## 2014-05-21 DIAGNOSIS — M6281 Muscle weakness (generalized): Secondary | ICD-10-CM | POA: Diagnosis not present

## 2014-05-21 DIAGNOSIS — R41841 Cognitive communication deficit: Secondary | ICD-10-CM | POA: Diagnosis not present

## 2014-05-21 DIAGNOSIS — I5040 Unspecified combined systolic (congestive) and diastolic (congestive) heart failure: Secondary | ICD-10-CM | POA: Diagnosis not present

## 2014-05-21 DIAGNOSIS — R293 Abnormal posture: Secondary | ICD-10-CM | POA: Diagnosis not present

## 2014-05-22 DIAGNOSIS — Z7901 Long term (current) use of anticoagulants: Secondary | ICD-10-CM | POA: Diagnosis not present

## 2014-05-22 DIAGNOSIS — R278 Other lack of coordination: Secondary | ICD-10-CM | POA: Diagnosis not present

## 2014-05-22 DIAGNOSIS — I482 Chronic atrial fibrillation: Secondary | ICD-10-CM | POA: Diagnosis not present

## 2014-05-22 DIAGNOSIS — I480 Paroxysmal atrial fibrillation: Secondary | ICD-10-CM | POA: Diagnosis not present

## 2014-05-22 DIAGNOSIS — I5040 Unspecified combined systolic (congestive) and diastolic (congestive) heart failure: Secondary | ICD-10-CM | POA: Diagnosis not present

## 2014-05-22 DIAGNOSIS — R41841 Cognitive communication deficit: Secondary | ICD-10-CM | POA: Diagnosis not present

## 2014-05-22 DIAGNOSIS — R2681 Unsteadiness on feet: Secondary | ICD-10-CM | POA: Diagnosis not present

## 2014-05-22 DIAGNOSIS — I69959 Hemiplegia and hemiparesis following unspecified cerebrovascular disease affecting unspecified side: Secondary | ICD-10-CM | POA: Diagnosis not present

## 2014-05-22 DIAGNOSIS — M6281 Muscle weakness (generalized): Secondary | ICD-10-CM | POA: Diagnosis not present

## 2014-05-22 DIAGNOSIS — R293 Abnormal posture: Secondary | ICD-10-CM | POA: Diagnosis not present

## 2014-05-23 DIAGNOSIS — R278 Other lack of coordination: Secondary | ICD-10-CM | POA: Diagnosis not present

## 2014-05-23 DIAGNOSIS — R2681 Unsteadiness on feet: Secondary | ICD-10-CM | POA: Diagnosis not present

## 2014-05-23 DIAGNOSIS — M6281 Muscle weakness (generalized): Secondary | ICD-10-CM | POA: Diagnosis not present

## 2014-05-23 DIAGNOSIS — R41841 Cognitive communication deficit: Secondary | ICD-10-CM | POA: Diagnosis not present

## 2014-05-23 DIAGNOSIS — R293 Abnormal posture: Secondary | ICD-10-CM | POA: Diagnosis not present

## 2014-05-23 DIAGNOSIS — I5040 Unspecified combined systolic (congestive) and diastolic (congestive) heart failure: Secondary | ICD-10-CM | POA: Diagnosis not present

## 2014-05-27 DIAGNOSIS — Z7901 Long term (current) use of anticoagulants: Secondary | ICD-10-CM | POA: Diagnosis not present

## 2014-05-27 DIAGNOSIS — I4891 Unspecified atrial fibrillation: Secondary | ICD-10-CM | POA: Diagnosis not present

## 2014-05-27 DIAGNOSIS — N189 Chronic kidney disease, unspecified: Secondary | ICD-10-CM | POA: Diagnosis not present

## 2014-05-27 DIAGNOSIS — D649 Anemia, unspecified: Secondary | ICD-10-CM | POA: Diagnosis not present

## 2014-05-27 DIAGNOSIS — I480 Paroxysmal atrial fibrillation: Secondary | ICD-10-CM | POA: Diagnosis not present

## 2014-05-27 DIAGNOSIS — Z79899 Other long term (current) drug therapy: Secondary | ICD-10-CM | POA: Diagnosis not present

## 2014-05-27 DIAGNOSIS — I5040 Unspecified combined systolic (congestive) and diastolic (congestive) heart failure: Secondary | ICD-10-CM | POA: Diagnosis not present

## 2014-05-27 DIAGNOSIS — I69959 Hemiplegia and hemiparesis following unspecified cerebrovascular disease affecting unspecified side: Secondary | ICD-10-CM | POA: Diagnosis not present

## 2014-05-28 DIAGNOSIS — L89623 Pressure ulcer of left heel, stage 3: Secondary | ICD-10-CM | POA: Diagnosis not present

## 2014-05-30 DIAGNOSIS — I4891 Unspecified atrial fibrillation: Secondary | ICD-10-CM | POA: Diagnosis not present

## 2014-05-30 DIAGNOSIS — N189 Chronic kidney disease, unspecified: Secondary | ICD-10-CM | POA: Diagnosis not present

## 2014-05-30 DIAGNOSIS — D649 Anemia, unspecified: Secondary | ICD-10-CM | POA: Diagnosis not present

## 2014-05-30 DIAGNOSIS — Z7901 Long term (current) use of anticoagulants: Secondary | ICD-10-CM | POA: Diagnosis not present

## 2014-05-30 DIAGNOSIS — I480 Paroxysmal atrial fibrillation: Secondary | ICD-10-CM | POA: Diagnosis not present

## 2014-05-30 DIAGNOSIS — I69959 Hemiplegia and hemiparesis following unspecified cerebrovascular disease affecting unspecified side: Secondary | ICD-10-CM | POA: Diagnosis not present

## 2014-06-03 DIAGNOSIS — D649 Anemia, unspecified: Secondary | ICD-10-CM | POA: Diagnosis not present

## 2014-06-03 DIAGNOSIS — E875 Hyperkalemia: Secondary | ICD-10-CM | POA: Diagnosis not present

## 2014-06-03 DIAGNOSIS — I5040 Unspecified combined systolic (congestive) and diastolic (congestive) heart failure: Secondary | ICD-10-CM | POA: Diagnosis not present

## 2014-06-03 DIAGNOSIS — D509 Iron deficiency anemia, unspecified: Secondary | ICD-10-CM | POA: Diagnosis not present

## 2014-06-03 DIAGNOSIS — L89609 Pressure ulcer of unspecified heel, unspecified stage: Secondary | ICD-10-CM | POA: Diagnosis not present

## 2014-06-03 DIAGNOSIS — N189 Chronic kidney disease, unspecified: Secondary | ICD-10-CM | POA: Diagnosis not present

## 2014-06-03 DIAGNOSIS — N182 Chronic kidney disease, stage 2 (mild): Secondary | ICD-10-CM | POA: Diagnosis not present

## 2014-06-03 DIAGNOSIS — Z79899 Other long term (current) drug therapy: Secondary | ICD-10-CM | POA: Diagnosis not present

## 2014-06-04 DIAGNOSIS — L89623 Pressure ulcer of left heel, stage 3: Secondary | ICD-10-CM | POA: Diagnosis not present

## 2014-06-06 DIAGNOSIS — Z7901 Long term (current) use of anticoagulants: Secondary | ICD-10-CM | POA: Diagnosis not present

## 2014-06-06 DIAGNOSIS — I480 Paroxysmal atrial fibrillation: Secondary | ICD-10-CM | POA: Diagnosis not present

## 2014-06-06 DIAGNOSIS — D649 Anemia, unspecified: Secondary | ICD-10-CM | POA: Diagnosis not present

## 2014-06-06 DIAGNOSIS — I4891 Unspecified atrial fibrillation: Secondary | ICD-10-CM | POA: Diagnosis not present

## 2014-06-06 DIAGNOSIS — I69959 Hemiplegia and hemiparesis following unspecified cerebrovascular disease affecting unspecified side: Secondary | ICD-10-CM | POA: Diagnosis not present

## 2014-06-10 DIAGNOSIS — D649 Anemia, unspecified: Secondary | ICD-10-CM | POA: Diagnosis not present

## 2014-06-10 DIAGNOSIS — N182 Chronic kidney disease, stage 2 (mild): Secondary | ICD-10-CM | POA: Diagnosis not present

## 2014-06-10 DIAGNOSIS — I69959 Hemiplegia and hemiparesis following unspecified cerebrovascular disease affecting unspecified side: Secondary | ICD-10-CM | POA: Diagnosis not present

## 2014-06-10 DIAGNOSIS — Z79899 Other long term (current) drug therapy: Secondary | ICD-10-CM | POA: Diagnosis not present

## 2014-06-10 DIAGNOSIS — M79609 Pain in unspecified limb: Secondary | ICD-10-CM | POA: Diagnosis not present

## 2014-06-10 DIAGNOSIS — D509 Iron deficiency anemia, unspecified: Secondary | ICD-10-CM | POA: Diagnosis not present

## 2014-06-10 DIAGNOSIS — I480 Paroxysmal atrial fibrillation: Secondary | ICD-10-CM | POA: Diagnosis not present

## 2014-06-11 DIAGNOSIS — L89623 Pressure ulcer of left heel, stage 3: Secondary | ICD-10-CM | POA: Diagnosis not present

## 2014-06-12 DIAGNOSIS — B351 Tinea unguium: Secondary | ICD-10-CM | POA: Diagnosis not present

## 2014-06-12 DIAGNOSIS — M79672 Pain in left foot: Secondary | ICD-10-CM | POA: Diagnosis not present

## 2014-06-12 DIAGNOSIS — M79674 Pain in right toe(s): Secondary | ICD-10-CM | POA: Diagnosis not present

## 2014-06-12 DIAGNOSIS — M79675 Pain in left toe(s): Secondary | ICD-10-CM | POA: Diagnosis not present

## 2014-06-13 DIAGNOSIS — I5040 Unspecified combined systolic (congestive) and diastolic (congestive) heart failure: Secondary | ICD-10-CM | POA: Diagnosis not present

## 2014-06-13 DIAGNOSIS — Z7901 Long term (current) use of anticoagulants: Secondary | ICD-10-CM | POA: Diagnosis not present

## 2014-06-13 DIAGNOSIS — I480 Paroxysmal atrial fibrillation: Secondary | ICD-10-CM | POA: Diagnosis not present

## 2014-06-13 DIAGNOSIS — M79609 Pain in unspecified limb: Secondary | ICD-10-CM | POA: Diagnosis not present

## 2014-06-13 DIAGNOSIS — I69959 Hemiplegia and hemiparesis following unspecified cerebrovascular disease affecting unspecified side: Secondary | ICD-10-CM | POA: Diagnosis not present

## 2014-06-13 DIAGNOSIS — D649 Anemia, unspecified: Secondary | ICD-10-CM | POA: Diagnosis not present

## 2014-06-13 DIAGNOSIS — I482 Chronic atrial fibrillation: Secondary | ICD-10-CM | POA: Diagnosis not present

## 2014-06-15 DIAGNOSIS — M1A9XX0 Chronic gout, unspecified, without tophus (tophi): Secondary | ICD-10-CM | POA: Diagnosis not present

## 2014-06-15 DIAGNOSIS — M79609 Pain in unspecified limb: Secondary | ICD-10-CM | POA: Diagnosis not present

## 2014-06-16 DIAGNOSIS — M79609 Pain in unspecified limb: Secondary | ICD-10-CM | POA: Diagnosis not present

## 2014-06-16 DIAGNOSIS — L89609 Pressure ulcer of unspecified heel, unspecified stage: Secondary | ICD-10-CM | POA: Diagnosis not present

## 2014-06-16 DIAGNOSIS — B351 Tinea unguium: Secondary | ICD-10-CM | POA: Diagnosis not present

## 2014-06-16 DIAGNOSIS — M79672 Pain in left foot: Secondary | ICD-10-CM | POA: Diagnosis not present

## 2014-06-17 DIAGNOSIS — D509 Iron deficiency anemia, unspecified: Secondary | ICD-10-CM | POA: Diagnosis not present

## 2014-06-17 DIAGNOSIS — D649 Anemia, unspecified: Secondary | ICD-10-CM | POA: Diagnosis not present

## 2014-06-17 DIAGNOSIS — I5040 Unspecified combined systolic (congestive) and diastolic (congestive) heart failure: Secondary | ICD-10-CM | POA: Diagnosis not present

## 2014-06-18 DIAGNOSIS — L89623 Pressure ulcer of left heel, stage 3: Secondary | ICD-10-CM | POA: Diagnosis not present

## 2014-06-19 DIAGNOSIS — I482 Chronic atrial fibrillation: Secondary | ICD-10-CM | POA: Diagnosis not present

## 2014-06-19 DIAGNOSIS — Z7901 Long term (current) use of anticoagulants: Secondary | ICD-10-CM | POA: Diagnosis not present

## 2014-06-24 DIAGNOSIS — M79672 Pain in left foot: Secondary | ICD-10-CM | POA: Diagnosis not present

## 2014-06-24 DIAGNOSIS — Z79899 Other long term (current) drug therapy: Secondary | ICD-10-CM | POA: Diagnosis not present

## 2014-06-24 DIAGNOSIS — D509 Iron deficiency anemia, unspecified: Secondary | ICD-10-CM | POA: Diagnosis not present

## 2014-06-24 DIAGNOSIS — L89609 Pressure ulcer of unspecified heel, unspecified stage: Secondary | ICD-10-CM | POA: Diagnosis not present

## 2014-06-24 DIAGNOSIS — I5040 Unspecified combined systolic (congestive) and diastolic (congestive) heart failure: Secondary | ICD-10-CM | POA: Diagnosis not present

## 2014-06-24 DIAGNOSIS — E875 Hyperkalemia: Secondary | ICD-10-CM | POA: Diagnosis not present

## 2014-06-24 DIAGNOSIS — I482 Chronic atrial fibrillation: Secondary | ICD-10-CM | POA: Diagnosis not present

## 2014-06-24 DIAGNOSIS — M1990 Unspecified osteoarthritis, unspecified site: Secondary | ICD-10-CM | POA: Diagnosis not present

## 2014-06-24 DIAGNOSIS — D649 Anemia, unspecified: Secondary | ICD-10-CM | POA: Diagnosis not present

## 2014-06-25 DIAGNOSIS — L89623 Pressure ulcer of left heel, stage 3: Secondary | ICD-10-CM | POA: Diagnosis not present

## 2014-06-26 DIAGNOSIS — I69959 Hemiplegia and hemiparesis following unspecified cerebrovascular disease affecting unspecified side: Secondary | ICD-10-CM | POA: Diagnosis not present

## 2014-06-26 DIAGNOSIS — I480 Paroxysmal atrial fibrillation: Secondary | ICD-10-CM | POA: Diagnosis not present

## 2014-06-26 DIAGNOSIS — D649 Anemia, unspecified: Secondary | ICD-10-CM | POA: Diagnosis not present

## 2014-06-26 DIAGNOSIS — Z7901 Long term (current) use of anticoagulants: Secondary | ICD-10-CM | POA: Diagnosis not present

## 2014-06-26 DIAGNOSIS — I482 Chronic atrial fibrillation: Secondary | ICD-10-CM | POA: Diagnosis not present

## 2014-07-01 DIAGNOSIS — I10 Essential (primary) hypertension: Secondary | ICD-10-CM | POA: Diagnosis not present

## 2014-07-01 DIAGNOSIS — N189 Chronic kidney disease, unspecified: Secondary | ICD-10-CM | POA: Diagnosis not present

## 2014-07-01 DIAGNOSIS — N390 Urinary tract infection, site not specified: Secondary | ICD-10-CM | POA: Diagnosis not present

## 2014-07-01 DIAGNOSIS — M1990 Unspecified osteoarthritis, unspecified site: Secondary | ICD-10-CM | POA: Diagnosis not present

## 2014-07-01 DIAGNOSIS — L89609 Pressure ulcer of unspecified heel, unspecified stage: Secondary | ICD-10-CM | POA: Diagnosis not present

## 2014-07-01 DIAGNOSIS — D509 Iron deficiency anemia, unspecified: Secondary | ICD-10-CM | POA: Diagnosis not present

## 2014-07-01 DIAGNOSIS — D649 Anemia, unspecified: Secondary | ICD-10-CM | POA: Diagnosis not present

## 2014-07-01 DIAGNOSIS — Z79899 Other long term (current) drug therapy: Secondary | ICD-10-CM | POA: Diagnosis not present

## 2014-07-01 DIAGNOSIS — N182 Chronic kidney disease, stage 2 (mild): Secondary | ICD-10-CM | POA: Diagnosis not present

## 2014-07-02 DIAGNOSIS — I10 Essential (primary) hypertension: Secondary | ICD-10-CM | POA: Diagnosis not present

## 2014-07-02 DIAGNOSIS — L89609 Pressure ulcer of unspecified heel, unspecified stage: Secondary | ICD-10-CM | POA: Diagnosis not present

## 2014-07-02 DIAGNOSIS — N390 Urinary tract infection, site not specified: Secondary | ICD-10-CM | POA: Diagnosis not present

## 2014-07-02 DIAGNOSIS — R319 Hematuria, unspecified: Secondary | ICD-10-CM | POA: Diagnosis not present

## 2014-07-02 DIAGNOSIS — L89623 Pressure ulcer of left heel, stage 3: Secondary | ICD-10-CM | POA: Diagnosis not present

## 2014-07-02 DIAGNOSIS — N182 Chronic kidney disease, stage 2 (mild): Secondary | ICD-10-CM | POA: Diagnosis not present

## 2014-07-03 DIAGNOSIS — Z7901 Long term (current) use of anticoagulants: Secondary | ICD-10-CM | POA: Diagnosis not present

## 2014-07-03 DIAGNOSIS — I10 Essential (primary) hypertension: Secondary | ICD-10-CM | POA: Diagnosis not present

## 2014-07-03 DIAGNOSIS — I4891 Unspecified atrial fibrillation: Secondary | ICD-10-CM | POA: Diagnosis not present

## 2014-07-03 DIAGNOSIS — L89609 Pressure ulcer of unspecified heel, unspecified stage: Secondary | ICD-10-CM | POA: Diagnosis not present

## 2014-07-03 DIAGNOSIS — I482 Chronic atrial fibrillation: Secondary | ICD-10-CM | POA: Diagnosis not present

## 2014-07-03 DIAGNOSIS — N390 Urinary tract infection, site not specified: Secondary | ICD-10-CM | POA: Diagnosis not present

## 2014-07-03 DIAGNOSIS — I5040 Unspecified combined systolic (congestive) and diastolic (congestive) heart failure: Secondary | ICD-10-CM | POA: Diagnosis not present

## 2014-07-05 DIAGNOSIS — Z7901 Long term (current) use of anticoagulants: Secondary | ICD-10-CM | POA: Diagnosis not present

## 2014-07-05 DIAGNOSIS — N390 Urinary tract infection, site not specified: Secondary | ICD-10-CM | POA: Diagnosis not present

## 2014-07-05 DIAGNOSIS — I480 Paroxysmal atrial fibrillation: Secondary | ICD-10-CM | POA: Diagnosis not present

## 2014-07-08 DIAGNOSIS — Z7901 Long term (current) use of anticoagulants: Secondary | ICD-10-CM | POA: Diagnosis not present

## 2014-07-08 DIAGNOSIS — E46 Unspecified protein-calorie malnutrition: Secondary | ICD-10-CM | POA: Diagnosis not present

## 2014-07-08 DIAGNOSIS — L89609 Pressure ulcer of unspecified heel, unspecified stage: Secondary | ICD-10-CM | POA: Diagnosis not present

## 2014-07-08 DIAGNOSIS — M1990 Unspecified osteoarthritis, unspecified site: Secondary | ICD-10-CM | POA: Diagnosis not present

## 2014-07-08 DIAGNOSIS — N390 Urinary tract infection, site not specified: Secondary | ICD-10-CM | POA: Diagnosis not present

## 2014-07-08 DIAGNOSIS — I482 Chronic atrial fibrillation: Secondary | ICD-10-CM | POA: Diagnosis not present

## 2014-07-09 DIAGNOSIS — L89623 Pressure ulcer of left heel, stage 3: Secondary | ICD-10-CM | POA: Diagnosis not present

## 2014-07-14 DIAGNOSIS — I480 Paroxysmal atrial fibrillation: Secondary | ICD-10-CM | POA: Diagnosis not present

## 2014-07-14 DIAGNOSIS — L89609 Pressure ulcer of unspecified heel, unspecified stage: Secondary | ICD-10-CM | POA: Diagnosis not present

## 2014-07-14 DIAGNOSIS — Z7901 Long term (current) use of anticoagulants: Secondary | ICD-10-CM | POA: Diagnosis not present

## 2014-07-14 DIAGNOSIS — E46 Unspecified protein-calorie malnutrition: Secondary | ICD-10-CM | POA: Diagnosis not present

## 2014-07-16 DIAGNOSIS — L89623 Pressure ulcer of left heel, stage 3: Secondary | ICD-10-CM | POA: Diagnosis not present

## 2014-07-21 DIAGNOSIS — K219 Gastro-esophageal reflux disease without esophagitis: Secondary | ICD-10-CM | POA: Diagnosis not present

## 2014-07-21 DIAGNOSIS — D649 Anemia, unspecified: Secondary | ICD-10-CM | POA: Diagnosis not present

## 2014-07-21 DIAGNOSIS — Z7901 Long term (current) use of anticoagulants: Secondary | ICD-10-CM | POA: Diagnosis not present

## 2014-07-21 DIAGNOSIS — I480 Paroxysmal atrial fibrillation: Secondary | ICD-10-CM | POA: Diagnosis not present

## 2014-07-21 DIAGNOSIS — K5900 Constipation, unspecified: Secondary | ICD-10-CM | POA: Diagnosis not present

## 2014-07-22 DIAGNOSIS — M6281 Muscle weakness (generalized): Secondary | ICD-10-CM | POA: Diagnosis not present

## 2014-07-22 DIAGNOSIS — I480 Paroxysmal atrial fibrillation: Secondary | ICD-10-CM | POA: Diagnosis not present

## 2014-07-22 DIAGNOSIS — I5040 Unspecified combined systolic (congestive) and diastolic (congestive) heart failure: Secondary | ICD-10-CM | POA: Diagnosis not present

## 2014-07-22 DIAGNOSIS — D649 Anemia, unspecified: Secondary | ICD-10-CM | POA: Diagnosis not present

## 2014-07-22 DIAGNOSIS — D509 Iron deficiency anemia, unspecified: Secondary | ICD-10-CM | POA: Diagnosis not present

## 2014-07-22 DIAGNOSIS — N182 Chronic kidney disease, stage 2 (mild): Secondary | ICD-10-CM | POA: Diagnosis not present

## 2014-07-22 DIAGNOSIS — R2681 Unsteadiness on feet: Secondary | ICD-10-CM | POA: Diagnosis not present

## 2014-07-22 DIAGNOSIS — Z79899 Other long term (current) drug therapy: Secondary | ICD-10-CM | POA: Diagnosis not present

## 2014-07-22 DIAGNOSIS — K5900 Constipation, unspecified: Secondary | ICD-10-CM | POA: Diagnosis not present

## 2014-07-22 DIAGNOSIS — K219 Gastro-esophageal reflux disease without esophagitis: Secondary | ICD-10-CM | POA: Diagnosis not present

## 2014-07-23 ENCOUNTER — Ambulatory Visit: Payer: Self-pay | Admitting: *Deleted

## 2014-07-23 DIAGNOSIS — I480 Paroxysmal atrial fibrillation: Secondary | ICD-10-CM

## 2014-07-23 DIAGNOSIS — I5040 Unspecified combined systolic (congestive) and diastolic (congestive) heart failure: Secondary | ICD-10-CM | POA: Diagnosis not present

## 2014-07-23 DIAGNOSIS — I639 Cerebral infarction, unspecified: Secondary | ICD-10-CM

## 2014-07-23 DIAGNOSIS — Z5181 Encounter for therapeutic drug level monitoring: Secondary | ICD-10-CM

## 2014-07-23 DIAGNOSIS — R2681 Unsteadiness on feet: Secondary | ICD-10-CM | POA: Diagnosis not present

## 2014-07-23 DIAGNOSIS — M6281 Muscle weakness (generalized): Secondary | ICD-10-CM | POA: Diagnosis not present

## 2014-07-24 DIAGNOSIS — I5040 Unspecified combined systolic (congestive) and diastolic (congestive) heart failure: Secondary | ICD-10-CM | POA: Diagnosis not present

## 2014-07-24 DIAGNOSIS — M6281 Muscle weakness (generalized): Secondary | ICD-10-CM | POA: Diagnosis not present

## 2014-07-24 DIAGNOSIS — R2681 Unsteadiness on feet: Secondary | ICD-10-CM | POA: Diagnosis not present

## 2014-07-25 DIAGNOSIS — I5040 Unspecified combined systolic (congestive) and diastolic (congestive) heart failure: Secondary | ICD-10-CM | POA: Diagnosis not present

## 2014-07-25 DIAGNOSIS — M6281 Muscle weakness (generalized): Secondary | ICD-10-CM | POA: Diagnosis not present

## 2014-07-25 DIAGNOSIS — R2681 Unsteadiness on feet: Secondary | ICD-10-CM | POA: Diagnosis not present

## 2014-07-28 DIAGNOSIS — E875 Hyperkalemia: Secondary | ICD-10-CM | POA: Diagnosis not present

## 2014-07-28 DIAGNOSIS — N189 Chronic kidney disease, unspecified: Secondary | ICD-10-CM | POA: Diagnosis not present

## 2014-07-28 DIAGNOSIS — M6281 Muscle weakness (generalized): Secondary | ICD-10-CM | POA: Diagnosis not present

## 2014-07-28 DIAGNOSIS — I480 Paroxysmal atrial fibrillation: Secondary | ICD-10-CM | POA: Diagnosis not present

## 2014-07-28 DIAGNOSIS — R2681 Unsteadiness on feet: Secondary | ICD-10-CM | POA: Diagnosis not present

## 2014-07-28 DIAGNOSIS — Z79899 Other long term (current) drug therapy: Secondary | ICD-10-CM | POA: Diagnosis not present

## 2014-07-28 DIAGNOSIS — I5040 Unspecified combined systolic (congestive) and diastolic (congestive) heart failure: Secondary | ICD-10-CM | POA: Diagnosis not present

## 2014-07-28 DIAGNOSIS — D649 Anemia, unspecified: Secondary | ICD-10-CM | POA: Diagnosis not present

## 2014-07-28 DIAGNOSIS — N182 Chronic kidney disease, stage 2 (mild): Secondary | ICD-10-CM | POA: Diagnosis not present

## 2014-07-28 DIAGNOSIS — D509 Iron deficiency anemia, unspecified: Secondary | ICD-10-CM | POA: Diagnosis not present

## 2014-07-28 DIAGNOSIS — Z7901 Long term (current) use of anticoagulants: Secondary | ICD-10-CM | POA: Diagnosis not present

## 2014-07-29 DIAGNOSIS — M6281 Muscle weakness (generalized): Secondary | ICD-10-CM | POA: Diagnosis not present

## 2014-07-29 DIAGNOSIS — I5040 Unspecified combined systolic (congestive) and diastolic (congestive) heart failure: Secondary | ICD-10-CM | POA: Diagnosis not present

## 2014-07-29 DIAGNOSIS — R2681 Unsteadiness on feet: Secondary | ICD-10-CM | POA: Diagnosis not present

## 2014-07-30 DIAGNOSIS — M6281 Muscle weakness (generalized): Secondary | ICD-10-CM | POA: Diagnosis not present

## 2014-07-30 DIAGNOSIS — I5040 Unspecified combined systolic (congestive) and diastolic (congestive) heart failure: Secondary | ICD-10-CM | POA: Diagnosis not present

## 2014-07-30 DIAGNOSIS — R2681 Unsteadiness on feet: Secondary | ICD-10-CM | POA: Diagnosis not present

## 2014-07-31 DIAGNOSIS — M6281 Muscle weakness (generalized): Secondary | ICD-10-CM | POA: Diagnosis not present

## 2014-07-31 DIAGNOSIS — I5040 Unspecified combined systolic (congestive) and diastolic (congestive) heart failure: Secondary | ICD-10-CM | POA: Diagnosis not present

## 2014-07-31 DIAGNOSIS — R2681 Unsteadiness on feet: Secondary | ICD-10-CM | POA: Diagnosis not present

## 2014-08-01 DIAGNOSIS — M6281 Muscle weakness (generalized): Secondary | ICD-10-CM | POA: Diagnosis not present

## 2014-08-01 DIAGNOSIS — I5040 Unspecified combined systolic (congestive) and diastolic (congestive) heart failure: Secondary | ICD-10-CM | POA: Diagnosis not present

## 2014-08-01 DIAGNOSIS — R2681 Unsteadiness on feet: Secondary | ICD-10-CM | POA: Diagnosis not present

## 2014-08-04 DIAGNOSIS — R2681 Unsteadiness on feet: Secondary | ICD-10-CM | POA: Diagnosis not present

## 2014-08-04 DIAGNOSIS — Z79899 Other long term (current) drug therapy: Secondary | ICD-10-CM | POA: Diagnosis not present

## 2014-08-04 DIAGNOSIS — D649 Anemia, unspecified: Secondary | ICD-10-CM | POA: Diagnosis not present

## 2014-08-04 DIAGNOSIS — I480 Paroxysmal atrial fibrillation: Secondary | ICD-10-CM | POA: Diagnosis not present

## 2014-08-04 DIAGNOSIS — D509 Iron deficiency anemia, unspecified: Secondary | ICD-10-CM | POA: Diagnosis not present

## 2014-08-04 DIAGNOSIS — N189 Chronic kidney disease, unspecified: Secondary | ICD-10-CM | POA: Diagnosis not present

## 2014-08-04 DIAGNOSIS — M6281 Muscle weakness (generalized): Secondary | ICD-10-CM | POA: Diagnosis not present

## 2014-08-04 DIAGNOSIS — Z7901 Long term (current) use of anticoagulants: Secondary | ICD-10-CM | POA: Diagnosis not present

## 2014-08-04 DIAGNOSIS — N182 Chronic kidney disease, stage 2 (mild): Secondary | ICD-10-CM | POA: Diagnosis not present

## 2014-08-04 DIAGNOSIS — E875 Hyperkalemia: Secondary | ICD-10-CM | POA: Diagnosis not present

## 2014-08-04 DIAGNOSIS — I5040 Unspecified combined systolic (congestive) and diastolic (congestive) heart failure: Secondary | ICD-10-CM | POA: Diagnosis not present

## 2014-08-05 DIAGNOSIS — M6281 Muscle weakness (generalized): Secondary | ICD-10-CM | POA: Diagnosis not present

## 2014-08-05 DIAGNOSIS — R2681 Unsteadiness on feet: Secondary | ICD-10-CM | POA: Diagnosis not present

## 2014-08-05 DIAGNOSIS — I5040 Unspecified combined systolic (congestive) and diastolic (congestive) heart failure: Secondary | ICD-10-CM | POA: Diagnosis not present

## 2014-08-06 DIAGNOSIS — I5040 Unspecified combined systolic (congestive) and diastolic (congestive) heart failure: Secondary | ICD-10-CM | POA: Diagnosis not present

## 2014-08-06 DIAGNOSIS — R2681 Unsteadiness on feet: Secondary | ICD-10-CM | POA: Diagnosis not present

## 2014-08-06 DIAGNOSIS — M6281 Muscle weakness (generalized): Secondary | ICD-10-CM | POA: Diagnosis not present

## 2014-08-06 DIAGNOSIS — Z7901 Long term (current) use of anticoagulants: Secondary | ICD-10-CM | POA: Diagnosis not present

## 2014-08-07 DIAGNOSIS — I480 Paroxysmal atrial fibrillation: Secondary | ICD-10-CM | POA: Diagnosis not present

## 2014-08-07 DIAGNOSIS — M6281 Muscle weakness (generalized): Secondary | ICD-10-CM | POA: Diagnosis not present

## 2014-08-07 DIAGNOSIS — R2681 Unsteadiness on feet: Secondary | ICD-10-CM | POA: Diagnosis not present

## 2014-08-07 DIAGNOSIS — I5040 Unspecified combined systolic (congestive) and diastolic (congestive) heart failure: Secondary | ICD-10-CM | POA: Diagnosis not present

## 2014-08-08 DIAGNOSIS — I5040 Unspecified combined systolic (congestive) and diastolic (congestive) heart failure: Secondary | ICD-10-CM | POA: Diagnosis not present

## 2014-08-08 DIAGNOSIS — M6281 Muscle weakness (generalized): Secondary | ICD-10-CM | POA: Diagnosis not present

## 2014-08-08 DIAGNOSIS — R2681 Unsteadiness on feet: Secondary | ICD-10-CM | POA: Diagnosis not present

## 2014-08-10 DIAGNOSIS — I5040 Unspecified combined systolic (congestive) and diastolic (congestive) heart failure: Secondary | ICD-10-CM | POA: Diagnosis not present

## 2014-08-10 DIAGNOSIS — M6281 Muscle weakness (generalized): Secondary | ICD-10-CM | POA: Diagnosis not present

## 2014-08-10 DIAGNOSIS — R2681 Unsteadiness on feet: Secondary | ICD-10-CM | POA: Diagnosis not present

## 2014-08-11 DIAGNOSIS — I5040 Unspecified combined systolic (congestive) and diastolic (congestive) heart failure: Secondary | ICD-10-CM | POA: Diagnosis not present

## 2014-08-11 DIAGNOSIS — M6281 Muscle weakness (generalized): Secondary | ICD-10-CM | POA: Diagnosis not present

## 2014-08-11 DIAGNOSIS — R2681 Unsteadiness on feet: Secondary | ICD-10-CM | POA: Diagnosis not present

## 2014-08-12 DIAGNOSIS — D649 Anemia, unspecified: Secondary | ICD-10-CM | POA: Diagnosis not present

## 2014-08-12 DIAGNOSIS — Z7901 Long term (current) use of anticoagulants: Secondary | ICD-10-CM | POA: Diagnosis not present

## 2014-08-12 DIAGNOSIS — M79609 Pain in unspecified limb: Secondary | ICD-10-CM | POA: Diagnosis not present

## 2014-08-12 DIAGNOSIS — I480 Paroxysmal atrial fibrillation: Secondary | ICD-10-CM | POA: Diagnosis not present

## 2014-08-12 DIAGNOSIS — N189 Chronic kidney disease, unspecified: Secondary | ICD-10-CM | POA: Diagnosis not present

## 2014-08-13 DIAGNOSIS — I480 Paroxysmal atrial fibrillation: Secondary | ICD-10-CM | POA: Diagnosis not present

## 2014-08-13 DIAGNOSIS — M6281 Muscle weakness (generalized): Secondary | ICD-10-CM | POA: Diagnosis not present

## 2014-08-13 DIAGNOSIS — R2681 Unsteadiness on feet: Secondary | ICD-10-CM | POA: Diagnosis not present

## 2014-08-13 DIAGNOSIS — I5040 Unspecified combined systolic (congestive) and diastolic (congestive) heart failure: Secondary | ICD-10-CM | POA: Diagnosis not present

## 2014-08-13 DIAGNOSIS — N189 Chronic kidney disease, unspecified: Secondary | ICD-10-CM | POA: Diagnosis not present

## 2014-08-13 DIAGNOSIS — I4891 Unspecified atrial fibrillation: Secondary | ICD-10-CM | POA: Diagnosis not present

## 2014-08-13 DIAGNOSIS — D509 Iron deficiency anemia, unspecified: Secondary | ICD-10-CM | POA: Diagnosis not present

## 2014-08-13 DIAGNOSIS — M79609 Pain in unspecified limb: Secondary | ICD-10-CM | POA: Diagnosis not present

## 2014-08-13 DIAGNOSIS — D649 Anemia, unspecified: Secondary | ICD-10-CM | POA: Diagnosis not present

## 2014-08-13 DIAGNOSIS — Z79899 Other long term (current) drug therapy: Secondary | ICD-10-CM | POA: Diagnosis not present

## 2014-08-13 DIAGNOSIS — I482 Chronic atrial fibrillation: Secondary | ICD-10-CM | POA: Diagnosis not present

## 2014-08-15 DIAGNOSIS — I5040 Unspecified combined systolic (congestive) and diastolic (congestive) heart failure: Secondary | ICD-10-CM | POA: Diagnosis not present

## 2014-08-15 DIAGNOSIS — R2681 Unsteadiness on feet: Secondary | ICD-10-CM | POA: Diagnosis not present

## 2014-08-15 DIAGNOSIS — M6281 Muscle weakness (generalized): Secondary | ICD-10-CM | POA: Diagnosis not present

## 2014-08-16 DIAGNOSIS — M6281 Muscle weakness (generalized): Secondary | ICD-10-CM | POA: Diagnosis not present

## 2014-08-16 DIAGNOSIS — R2681 Unsteadiness on feet: Secondary | ICD-10-CM | POA: Diagnosis not present

## 2014-08-16 DIAGNOSIS — I5040 Unspecified combined systolic (congestive) and diastolic (congestive) heart failure: Secondary | ICD-10-CM | POA: Diagnosis not present

## 2014-08-18 DIAGNOSIS — M6281 Muscle weakness (generalized): Secondary | ICD-10-CM | POA: Diagnosis not present

## 2014-08-18 DIAGNOSIS — I5040 Unspecified combined systolic (congestive) and diastolic (congestive) heart failure: Secondary | ICD-10-CM | POA: Diagnosis not present

## 2014-08-18 DIAGNOSIS — R2681 Unsteadiness on feet: Secondary | ICD-10-CM | POA: Diagnosis not present

## 2014-08-19 DIAGNOSIS — R2681 Unsteadiness on feet: Secondary | ICD-10-CM | POA: Diagnosis not present

## 2014-08-19 DIAGNOSIS — N189 Chronic kidney disease, unspecified: Secondary | ICD-10-CM | POA: Diagnosis not present

## 2014-08-19 DIAGNOSIS — I480 Paroxysmal atrial fibrillation: Secondary | ICD-10-CM | POA: Diagnosis not present

## 2014-08-19 DIAGNOSIS — N182 Chronic kidney disease, stage 2 (mild): Secondary | ICD-10-CM | POA: Diagnosis not present

## 2014-08-19 DIAGNOSIS — Z7901 Long term (current) use of anticoagulants: Secondary | ICD-10-CM | POA: Diagnosis not present

## 2014-08-19 DIAGNOSIS — D509 Iron deficiency anemia, unspecified: Secondary | ICD-10-CM | POA: Diagnosis not present

## 2014-08-19 DIAGNOSIS — Z79899 Other long term (current) drug therapy: Secondary | ICD-10-CM | POA: Diagnosis not present

## 2014-08-19 DIAGNOSIS — I5040 Unspecified combined systolic (congestive) and diastolic (congestive) heart failure: Secondary | ICD-10-CM | POA: Diagnosis not present

## 2014-08-19 DIAGNOSIS — M6281 Muscle weakness (generalized): Secondary | ICD-10-CM | POA: Diagnosis not present

## 2014-08-19 DIAGNOSIS — D649 Anemia, unspecified: Secondary | ICD-10-CM | POA: Diagnosis not present

## 2014-08-20 DIAGNOSIS — M6281 Muscle weakness (generalized): Secondary | ICD-10-CM | POA: Diagnosis not present

## 2014-08-20 DIAGNOSIS — R2681 Unsteadiness on feet: Secondary | ICD-10-CM | POA: Diagnosis not present

## 2014-08-20 DIAGNOSIS — B351 Tinea unguium: Secondary | ICD-10-CM | POA: Diagnosis not present

## 2014-08-20 DIAGNOSIS — M79675 Pain in left toe(s): Secondary | ICD-10-CM | POA: Diagnosis not present

## 2014-08-20 DIAGNOSIS — M79674 Pain in right toe(s): Secondary | ICD-10-CM | POA: Diagnosis not present

## 2014-08-20 DIAGNOSIS — I5040 Unspecified combined systolic (congestive) and diastolic (congestive) heart failure: Secondary | ICD-10-CM | POA: Diagnosis not present

## 2014-08-21 DIAGNOSIS — M6281 Muscle weakness (generalized): Secondary | ICD-10-CM | POA: Diagnosis not present

## 2014-08-21 DIAGNOSIS — I5040 Unspecified combined systolic (congestive) and diastolic (congestive) heart failure: Secondary | ICD-10-CM | POA: Diagnosis not present

## 2014-08-21 DIAGNOSIS — R2681 Unsteadiness on feet: Secondary | ICD-10-CM | POA: Diagnosis not present

## 2014-08-22 DIAGNOSIS — M6281 Muscle weakness (generalized): Secondary | ICD-10-CM | POA: Diagnosis not present

## 2014-08-22 DIAGNOSIS — R2681 Unsteadiness on feet: Secondary | ICD-10-CM | POA: Diagnosis not present

## 2014-08-22 DIAGNOSIS — I480 Paroxysmal atrial fibrillation: Secondary | ICD-10-CM | POA: Diagnosis not present

## 2014-08-22 DIAGNOSIS — I5040 Unspecified combined systolic (congestive) and diastolic (congestive) heart failure: Secondary | ICD-10-CM | POA: Diagnosis not present

## 2014-08-22 DIAGNOSIS — Z7901 Long term (current) use of anticoagulants: Secondary | ICD-10-CM | POA: Diagnosis not present

## 2014-08-25 DIAGNOSIS — Z79899 Other long term (current) drug therapy: Secondary | ICD-10-CM | POA: Diagnosis not present

## 2014-08-25 DIAGNOSIS — I5040 Unspecified combined systolic (congestive) and diastolic (congestive) heart failure: Secondary | ICD-10-CM | POA: Diagnosis not present

## 2014-08-25 DIAGNOSIS — D649 Anemia, unspecified: Secondary | ICD-10-CM | POA: Diagnosis not present

## 2014-08-25 DIAGNOSIS — R2681 Unsteadiness on feet: Secondary | ICD-10-CM | POA: Diagnosis not present

## 2014-08-25 DIAGNOSIS — N182 Chronic kidney disease, stage 2 (mild): Secondary | ICD-10-CM | POA: Diagnosis not present

## 2014-08-25 DIAGNOSIS — D509 Iron deficiency anemia, unspecified: Secondary | ICD-10-CM | POA: Diagnosis not present

## 2014-08-25 DIAGNOSIS — M6281 Muscle weakness (generalized): Secondary | ICD-10-CM | POA: Diagnosis not present

## 2014-08-27 DIAGNOSIS — M129 Arthropathy, unspecified: Secondary | ICD-10-CM | POA: Diagnosis not present

## 2014-08-27 DIAGNOSIS — M1990 Unspecified osteoarthritis, unspecified site: Secondary | ICD-10-CM | POA: Diagnosis not present

## 2014-08-27 DIAGNOSIS — M79609 Pain in unspecified limb: Secondary | ICD-10-CM | POA: Diagnosis not present

## 2014-08-27 DIAGNOSIS — I5040 Unspecified combined systolic (congestive) and diastolic (congestive) heart failure: Secondary | ICD-10-CM | POA: Diagnosis not present

## 2014-08-27 DIAGNOSIS — R2681 Unsteadiness on feet: Secondary | ICD-10-CM | POA: Diagnosis not present

## 2014-08-27 DIAGNOSIS — M6281 Muscle weakness (generalized): Secondary | ICD-10-CM | POA: Diagnosis not present

## 2014-08-27 DIAGNOSIS — K5900 Constipation, unspecified: Secondary | ICD-10-CM | POA: Diagnosis not present

## 2014-08-28 DIAGNOSIS — I5040 Unspecified combined systolic (congestive) and diastolic (congestive) heart failure: Secondary | ICD-10-CM | POA: Diagnosis not present

## 2014-08-28 DIAGNOSIS — M6281 Muscle weakness (generalized): Secondary | ICD-10-CM | POA: Diagnosis not present

## 2014-08-28 DIAGNOSIS — R2681 Unsteadiness on feet: Secondary | ICD-10-CM | POA: Diagnosis not present

## 2014-08-29 DIAGNOSIS — M6281 Muscle weakness (generalized): Secondary | ICD-10-CM | POA: Diagnosis not present

## 2014-08-29 DIAGNOSIS — N189 Chronic kidney disease, unspecified: Secondary | ICD-10-CM | POA: Diagnosis not present

## 2014-08-29 DIAGNOSIS — I480 Paroxysmal atrial fibrillation: Secondary | ICD-10-CM | POA: Diagnosis not present

## 2014-08-29 DIAGNOSIS — Z7901 Long term (current) use of anticoagulants: Secondary | ICD-10-CM | POA: Diagnosis not present

## 2014-08-29 DIAGNOSIS — I10 Essential (primary) hypertension: Secondary | ICD-10-CM | POA: Diagnosis not present

## 2014-08-29 DIAGNOSIS — D649 Anemia, unspecified: Secondary | ICD-10-CM | POA: Diagnosis not present

## 2014-08-29 DIAGNOSIS — I5040 Unspecified combined systolic (congestive) and diastolic (congestive) heart failure: Secondary | ICD-10-CM | POA: Diagnosis not present

## 2014-08-29 DIAGNOSIS — R2681 Unsteadiness on feet: Secondary | ICD-10-CM | POA: Diagnosis not present

## 2014-08-30 DIAGNOSIS — R2681 Unsteadiness on feet: Secondary | ICD-10-CM | POA: Diagnosis not present

## 2014-08-30 DIAGNOSIS — M6281 Muscle weakness (generalized): Secondary | ICD-10-CM | POA: Diagnosis not present

## 2014-08-30 DIAGNOSIS — I5040 Unspecified combined systolic (congestive) and diastolic (congestive) heart failure: Secondary | ICD-10-CM | POA: Diagnosis not present

## 2014-08-31 DIAGNOSIS — M6281 Muscle weakness (generalized): Secondary | ICD-10-CM | POA: Diagnosis not present

## 2014-08-31 DIAGNOSIS — I5040 Unspecified combined systolic (congestive) and diastolic (congestive) heart failure: Secondary | ICD-10-CM | POA: Diagnosis not present

## 2014-08-31 DIAGNOSIS — R2681 Unsteadiness on feet: Secondary | ICD-10-CM | POA: Diagnosis not present

## 2014-09-01 DIAGNOSIS — I480 Paroxysmal atrial fibrillation: Secondary | ICD-10-CM | POA: Diagnosis not present

## 2014-09-01 DIAGNOSIS — I10 Essential (primary) hypertension: Secondary | ICD-10-CM | POA: Diagnosis not present

## 2014-09-01 DIAGNOSIS — N182 Chronic kidney disease, stage 2 (mild): Secondary | ICD-10-CM | POA: Diagnosis not present

## 2014-09-01 DIAGNOSIS — N189 Chronic kidney disease, unspecified: Secondary | ICD-10-CM | POA: Diagnosis not present

## 2014-09-01 DIAGNOSIS — D649 Anemia, unspecified: Secondary | ICD-10-CM | POA: Diagnosis not present

## 2014-09-01 DIAGNOSIS — Z79899 Other long term (current) drug therapy: Secondary | ICD-10-CM | POA: Diagnosis not present

## 2014-09-01 DIAGNOSIS — D509 Iron deficiency anemia, unspecified: Secondary | ICD-10-CM | POA: Diagnosis not present

## 2014-09-02 DIAGNOSIS — M6281 Muscle weakness (generalized): Secondary | ICD-10-CM | POA: Diagnosis not present

## 2014-09-02 DIAGNOSIS — I5040 Unspecified combined systolic (congestive) and diastolic (congestive) heart failure: Secondary | ICD-10-CM | POA: Diagnosis not present

## 2014-09-02 DIAGNOSIS — R2681 Unsteadiness on feet: Secondary | ICD-10-CM | POA: Diagnosis not present

## 2014-09-03 DIAGNOSIS — R2681 Unsteadiness on feet: Secondary | ICD-10-CM | POA: Diagnosis not present

## 2014-09-03 DIAGNOSIS — I5040 Unspecified combined systolic (congestive) and diastolic (congestive) heart failure: Secondary | ICD-10-CM | POA: Diagnosis not present

## 2014-09-03 DIAGNOSIS — M6281 Muscle weakness (generalized): Secondary | ICD-10-CM | POA: Diagnosis not present

## 2014-09-03 DIAGNOSIS — I480 Paroxysmal atrial fibrillation: Secondary | ICD-10-CM | POA: Diagnosis not present

## 2014-09-04 DIAGNOSIS — M6281 Muscle weakness (generalized): Secondary | ICD-10-CM | POA: Diagnosis not present

## 2014-09-04 DIAGNOSIS — I5040 Unspecified combined systolic (congestive) and diastolic (congestive) heart failure: Secondary | ICD-10-CM | POA: Diagnosis not present

## 2014-09-04 DIAGNOSIS — R2681 Unsteadiness on feet: Secondary | ICD-10-CM | POA: Diagnosis not present

## 2014-09-05 DIAGNOSIS — R2681 Unsteadiness on feet: Secondary | ICD-10-CM | POA: Diagnosis not present

## 2014-09-05 DIAGNOSIS — I5040 Unspecified combined systolic (congestive) and diastolic (congestive) heart failure: Secondary | ICD-10-CM | POA: Diagnosis not present

## 2014-09-05 DIAGNOSIS — I480 Paroxysmal atrial fibrillation: Secondary | ICD-10-CM | POA: Diagnosis not present

## 2014-09-05 DIAGNOSIS — Z7901 Long term (current) use of anticoagulants: Secondary | ICD-10-CM | POA: Diagnosis not present

## 2014-09-05 DIAGNOSIS — M6281 Muscle weakness (generalized): Secondary | ICD-10-CM | POA: Diagnosis not present

## 2014-09-08 DIAGNOSIS — I10 Essential (primary) hypertension: Secondary | ICD-10-CM | POA: Diagnosis not present

## 2014-09-08 DIAGNOSIS — N182 Chronic kidney disease, stage 2 (mild): Secondary | ICD-10-CM | POA: Diagnosis not present

## 2014-09-08 DIAGNOSIS — R6889 Other general symptoms and signs: Secondary | ICD-10-CM | POA: Diagnosis not present

## 2014-09-08 DIAGNOSIS — D649 Anemia, unspecified: Secondary | ICD-10-CM | POA: Diagnosis not present

## 2014-09-08 DIAGNOSIS — R2681 Unsteadiness on feet: Secondary | ICD-10-CM | POA: Diagnosis not present

## 2014-09-08 DIAGNOSIS — M6281 Muscle weakness (generalized): Secondary | ICD-10-CM | POA: Diagnosis not present

## 2014-09-08 DIAGNOSIS — I5040 Unspecified combined systolic (congestive) and diastolic (congestive) heart failure: Secondary | ICD-10-CM | POA: Diagnosis not present

## 2014-09-08 DIAGNOSIS — D509 Iron deficiency anemia, unspecified: Secondary | ICD-10-CM | POA: Diagnosis not present

## 2014-09-08 DIAGNOSIS — I480 Paroxysmal atrial fibrillation: Secondary | ICD-10-CM | POA: Diagnosis not present

## 2014-09-08 DIAGNOSIS — Z79899 Other long term (current) drug therapy: Secondary | ICD-10-CM | POA: Diagnosis not present

## 2014-09-09 DIAGNOSIS — M6281 Muscle weakness (generalized): Secondary | ICD-10-CM | POA: Diagnosis not present

## 2014-09-09 DIAGNOSIS — I5040 Unspecified combined systolic (congestive) and diastolic (congestive) heart failure: Secondary | ICD-10-CM | POA: Diagnosis not present

## 2014-09-09 DIAGNOSIS — R2681 Unsteadiness on feet: Secondary | ICD-10-CM | POA: Diagnosis not present

## 2014-09-10 DIAGNOSIS — R2681 Unsteadiness on feet: Secondary | ICD-10-CM | POA: Diagnosis not present

## 2014-09-10 DIAGNOSIS — I5040 Unspecified combined systolic (congestive) and diastolic (congestive) heart failure: Secondary | ICD-10-CM | POA: Diagnosis not present

## 2014-09-10 DIAGNOSIS — M6281 Muscle weakness (generalized): Secondary | ICD-10-CM | POA: Diagnosis not present

## 2014-09-11 DIAGNOSIS — R2681 Unsteadiness on feet: Secondary | ICD-10-CM | POA: Diagnosis not present

## 2014-09-11 DIAGNOSIS — M6281 Muscle weakness (generalized): Secondary | ICD-10-CM | POA: Diagnosis not present

## 2014-09-11 DIAGNOSIS — I5040 Unspecified combined systolic (congestive) and diastolic (congestive) heart failure: Secondary | ICD-10-CM | POA: Diagnosis not present

## 2014-09-12 DIAGNOSIS — M6281 Muscle weakness (generalized): Secondary | ICD-10-CM | POA: Diagnosis not present

## 2014-09-12 DIAGNOSIS — I5040 Unspecified combined systolic (congestive) and diastolic (congestive) heart failure: Secondary | ICD-10-CM | POA: Diagnosis not present

## 2014-09-12 DIAGNOSIS — R2681 Unsteadiness on feet: Secondary | ICD-10-CM | POA: Diagnosis not present

## 2014-09-15 DIAGNOSIS — Z79899 Other long term (current) drug therapy: Secondary | ICD-10-CM | POA: Diagnosis not present

## 2014-09-15 DIAGNOSIS — N182 Chronic kidney disease, stage 2 (mild): Secondary | ICD-10-CM | POA: Diagnosis not present

## 2014-09-15 DIAGNOSIS — R2681 Unsteadiness on feet: Secondary | ICD-10-CM | POA: Diagnosis not present

## 2014-09-15 DIAGNOSIS — R6889 Other general symptoms and signs: Secondary | ICD-10-CM | POA: Diagnosis not present

## 2014-09-15 DIAGNOSIS — M129 Arthropathy, unspecified: Secondary | ICD-10-CM | POA: Diagnosis not present

## 2014-09-15 DIAGNOSIS — M79671 Pain in right foot: Secondary | ICD-10-CM | POA: Diagnosis not present

## 2014-09-15 DIAGNOSIS — D509 Iron deficiency anemia, unspecified: Secondary | ICD-10-CM | POA: Diagnosis not present

## 2014-09-15 DIAGNOSIS — M6281 Muscle weakness (generalized): Secondary | ICD-10-CM | POA: Diagnosis not present

## 2014-09-15 DIAGNOSIS — I10 Essential (primary) hypertension: Secondary | ICD-10-CM | POA: Diagnosis not present

## 2014-09-15 DIAGNOSIS — D649 Anemia, unspecified: Secondary | ICD-10-CM | POA: Diagnosis not present

## 2014-09-15 DIAGNOSIS — I5040 Unspecified combined systolic (congestive) and diastolic (congestive) heart failure: Secondary | ICD-10-CM | POA: Diagnosis not present

## 2014-09-16 DIAGNOSIS — R2681 Unsteadiness on feet: Secondary | ICD-10-CM | POA: Diagnosis not present

## 2014-09-16 DIAGNOSIS — D649 Anemia, unspecified: Secondary | ICD-10-CM | POA: Diagnosis not present

## 2014-09-16 DIAGNOSIS — M19071 Primary osteoarthritis, right ankle and foot: Secondary | ICD-10-CM | POA: Diagnosis not present

## 2014-09-16 DIAGNOSIS — I5040 Unspecified combined systolic (congestive) and diastolic (congestive) heart failure: Secondary | ICD-10-CM | POA: Diagnosis not present

## 2014-09-16 DIAGNOSIS — M79671 Pain in right foot: Secondary | ICD-10-CM | POA: Diagnosis not present

## 2014-09-16 DIAGNOSIS — M129 Arthropathy, unspecified: Secondary | ICD-10-CM | POA: Diagnosis not present

## 2014-09-16 DIAGNOSIS — M6281 Muscle weakness (generalized): Secondary | ICD-10-CM | POA: Diagnosis not present

## 2014-09-17 DIAGNOSIS — L03031 Cellulitis of right toe: Secondary | ICD-10-CM | POA: Diagnosis not present

## 2014-09-17 DIAGNOSIS — I5040 Unspecified combined systolic (congestive) and diastolic (congestive) heart failure: Secondary | ICD-10-CM | POA: Diagnosis not present

## 2014-09-17 DIAGNOSIS — N182 Chronic kidney disease, stage 2 (mild): Secondary | ICD-10-CM | POA: Diagnosis not present

## 2014-09-17 DIAGNOSIS — I480 Paroxysmal atrial fibrillation: Secondary | ICD-10-CM | POA: Diagnosis not present

## 2014-09-17 DIAGNOSIS — I482 Chronic atrial fibrillation: Secondary | ICD-10-CM | POA: Diagnosis not present

## 2014-09-17 DIAGNOSIS — M7989 Other specified soft tissue disorders: Secondary | ICD-10-CM | POA: Diagnosis not present

## 2014-09-17 DIAGNOSIS — M109 Gout, unspecified: Secondary | ICD-10-CM | POA: Diagnosis not present

## 2014-09-17 DIAGNOSIS — M1A9XX0 Chronic gout, unspecified, without tophus (tophi): Secondary | ICD-10-CM | POA: Diagnosis not present

## 2014-09-17 DIAGNOSIS — M129 Arthropathy, unspecified: Secondary | ICD-10-CM | POA: Diagnosis not present

## 2014-09-17 DIAGNOSIS — M6281 Muscle weakness (generalized): Secondary | ICD-10-CM | POA: Diagnosis not present

## 2014-09-17 DIAGNOSIS — I4891 Unspecified atrial fibrillation: Secondary | ICD-10-CM | POA: Diagnosis not present

## 2014-09-17 DIAGNOSIS — I5022 Chronic systolic (congestive) heart failure: Secondary | ICD-10-CM | POA: Diagnosis not present

## 2014-09-17 DIAGNOSIS — D509 Iron deficiency anemia, unspecified: Secondary | ICD-10-CM | POA: Diagnosis not present

## 2014-09-17 DIAGNOSIS — R2681 Unsteadiness on feet: Secondary | ICD-10-CM | POA: Diagnosis not present

## 2014-09-17 DIAGNOSIS — M79671 Pain in right foot: Secondary | ICD-10-CM | POA: Diagnosis not present

## 2014-09-18 DIAGNOSIS — M6281 Muscle weakness (generalized): Secondary | ICD-10-CM | POA: Diagnosis not present

## 2014-09-18 DIAGNOSIS — I5040 Unspecified combined systolic (congestive) and diastolic (congestive) heart failure: Secondary | ICD-10-CM | POA: Diagnosis not present

## 2014-09-18 DIAGNOSIS — R2681 Unsteadiness on feet: Secondary | ICD-10-CM | POA: Diagnosis not present

## 2014-09-19 DIAGNOSIS — Z7901 Long term (current) use of anticoagulants: Secondary | ICD-10-CM | POA: Diagnosis not present

## 2014-09-19 DIAGNOSIS — M129 Arthropathy, unspecified: Secondary | ICD-10-CM | POA: Diagnosis not present

## 2014-09-19 DIAGNOSIS — M79671 Pain in right foot: Secondary | ICD-10-CM | POA: Diagnosis not present

## 2014-09-19 DIAGNOSIS — I480 Paroxysmal atrial fibrillation: Secondary | ICD-10-CM | POA: Diagnosis not present

## 2014-09-19 DIAGNOSIS — L03031 Cellulitis of right toe: Secondary | ICD-10-CM | POA: Diagnosis not present

## 2014-09-21 DIAGNOSIS — M6281 Muscle weakness (generalized): Secondary | ICD-10-CM | POA: Diagnosis not present

## 2014-09-21 DIAGNOSIS — I5040 Unspecified combined systolic (congestive) and diastolic (congestive) heart failure: Secondary | ICD-10-CM | POA: Diagnosis not present

## 2014-09-21 DIAGNOSIS — R2681 Unsteadiness on feet: Secondary | ICD-10-CM | POA: Diagnosis not present

## 2014-09-22 DIAGNOSIS — M79609 Pain in unspecified limb: Secondary | ICD-10-CM | POA: Diagnosis not present

## 2014-09-22 DIAGNOSIS — N182 Chronic kidney disease, stage 2 (mild): Secondary | ICD-10-CM | POA: Diagnosis not present

## 2014-09-22 DIAGNOSIS — I5040 Unspecified combined systolic (congestive) and diastolic (congestive) heart failure: Secondary | ICD-10-CM | POA: Diagnosis not present

## 2014-09-22 DIAGNOSIS — Z7901 Long term (current) use of anticoagulants: Secondary | ICD-10-CM | POA: Diagnosis not present

## 2014-09-22 DIAGNOSIS — M129 Arthropathy, unspecified: Secondary | ICD-10-CM | POA: Diagnosis not present

## 2014-09-22 DIAGNOSIS — R2681 Unsteadiness on feet: Secondary | ICD-10-CM | POA: Diagnosis not present

## 2014-09-22 DIAGNOSIS — L03031 Cellulitis of right toe: Secondary | ICD-10-CM | POA: Diagnosis not present

## 2014-09-22 DIAGNOSIS — M79671 Pain in right foot: Secondary | ICD-10-CM | POA: Diagnosis not present

## 2014-09-22 DIAGNOSIS — D649 Anemia, unspecified: Secondary | ICD-10-CM | POA: Diagnosis not present

## 2014-09-22 DIAGNOSIS — Z79899 Other long term (current) drug therapy: Secondary | ICD-10-CM | POA: Diagnosis not present

## 2014-09-22 DIAGNOSIS — M6281 Muscle weakness (generalized): Secondary | ICD-10-CM | POA: Diagnosis not present

## 2014-09-22 DIAGNOSIS — D509 Iron deficiency anemia, unspecified: Secondary | ICD-10-CM | POA: Diagnosis not present

## 2014-09-23 DIAGNOSIS — M6281 Muscle weakness (generalized): Secondary | ICD-10-CM | POA: Diagnosis not present

## 2014-09-23 DIAGNOSIS — R2681 Unsteadiness on feet: Secondary | ICD-10-CM | POA: Diagnosis not present

## 2014-09-23 DIAGNOSIS — I5040 Unspecified combined systolic (congestive) and diastolic (congestive) heart failure: Secondary | ICD-10-CM | POA: Diagnosis not present

## 2014-09-24 DIAGNOSIS — Z7901 Long term (current) use of anticoagulants: Secondary | ICD-10-CM | POA: Diagnosis not present

## 2014-09-24 DIAGNOSIS — M79671 Pain in right foot: Secondary | ICD-10-CM | POA: Diagnosis not present

## 2014-09-24 DIAGNOSIS — Z79899 Other long term (current) drug therapy: Secondary | ICD-10-CM | POA: Diagnosis not present

## 2014-09-24 DIAGNOSIS — M79609 Pain in unspecified limb: Secondary | ICD-10-CM | POA: Diagnosis not present

## 2014-09-24 DIAGNOSIS — N182 Chronic kidney disease, stage 2 (mild): Secondary | ICD-10-CM | POA: Diagnosis not present

## 2014-09-24 DIAGNOSIS — I5040 Unspecified combined systolic (congestive) and diastolic (congestive) heart failure: Secondary | ICD-10-CM | POA: Diagnosis not present

## 2014-09-24 DIAGNOSIS — L03031 Cellulitis of right toe: Secondary | ICD-10-CM | POA: Diagnosis not present

## 2014-09-24 DIAGNOSIS — M6281 Muscle weakness (generalized): Secondary | ICD-10-CM | POA: Diagnosis not present

## 2014-09-24 DIAGNOSIS — R2681 Unsteadiness on feet: Secondary | ICD-10-CM | POA: Diagnosis not present

## 2014-09-24 DIAGNOSIS — D649 Anemia, unspecified: Secondary | ICD-10-CM | POA: Diagnosis not present

## 2014-09-24 DIAGNOSIS — M129 Arthropathy, unspecified: Secondary | ICD-10-CM | POA: Diagnosis not present

## 2014-09-25 DIAGNOSIS — M6281 Muscle weakness (generalized): Secondary | ICD-10-CM | POA: Diagnosis not present

## 2014-09-25 DIAGNOSIS — I5040 Unspecified combined systolic (congestive) and diastolic (congestive) heart failure: Secondary | ICD-10-CM | POA: Diagnosis not present

## 2014-09-25 DIAGNOSIS — R2681 Unsteadiness on feet: Secondary | ICD-10-CM | POA: Diagnosis not present

## 2014-09-27 DIAGNOSIS — I480 Paroxysmal atrial fibrillation: Secondary | ICD-10-CM | POA: Diagnosis not present

## 2014-09-27 DIAGNOSIS — Z7901 Long term (current) use of anticoagulants: Secondary | ICD-10-CM | POA: Diagnosis not present

## 2014-09-27 DIAGNOSIS — I4891 Unspecified atrial fibrillation: Secondary | ICD-10-CM | POA: Diagnosis not present

## 2014-09-29 DIAGNOSIS — I5040 Unspecified combined systolic (congestive) and diastolic (congestive) heart failure: Secondary | ICD-10-CM | POA: Diagnosis not present

## 2014-09-29 DIAGNOSIS — M6281 Muscle weakness (generalized): Secondary | ICD-10-CM | POA: Diagnosis not present

## 2014-09-29 DIAGNOSIS — R2681 Unsteadiness on feet: Secondary | ICD-10-CM | POA: Diagnosis not present

## 2014-09-29 DIAGNOSIS — D649 Anemia, unspecified: Secondary | ICD-10-CM | POA: Diagnosis not present

## 2014-09-29 DIAGNOSIS — D509 Iron deficiency anemia, unspecified: Secondary | ICD-10-CM | POA: Diagnosis not present

## 2014-09-29 DIAGNOSIS — N182 Chronic kidney disease, stage 2 (mild): Secondary | ICD-10-CM | POA: Diagnosis not present

## 2014-09-29 DIAGNOSIS — Z79899 Other long term (current) drug therapy: Secondary | ICD-10-CM | POA: Diagnosis not present

## 2014-09-30 DIAGNOSIS — R2681 Unsteadiness on feet: Secondary | ICD-10-CM | POA: Diagnosis not present

## 2014-09-30 DIAGNOSIS — I5040 Unspecified combined systolic (congestive) and diastolic (congestive) heart failure: Secondary | ICD-10-CM | POA: Diagnosis not present

## 2014-09-30 DIAGNOSIS — I480 Paroxysmal atrial fibrillation: Secondary | ICD-10-CM | POA: Diagnosis not present

## 2014-09-30 DIAGNOSIS — Z7901 Long term (current) use of anticoagulants: Secondary | ICD-10-CM | POA: Diagnosis not present

## 2014-09-30 DIAGNOSIS — M6281 Muscle weakness (generalized): Secondary | ICD-10-CM | POA: Diagnosis not present

## 2014-10-01 DIAGNOSIS — L97529 Non-pressure chronic ulcer of other part of left foot with unspecified severity: Secondary | ICD-10-CM | POA: Diagnosis not present

## 2014-10-01 DIAGNOSIS — M6281 Muscle weakness (generalized): Secondary | ICD-10-CM | POA: Diagnosis not present

## 2014-10-01 DIAGNOSIS — I5040 Unspecified combined systolic (congestive) and diastolic (congestive) heart failure: Secondary | ICD-10-CM | POA: Diagnosis not present

## 2014-10-01 DIAGNOSIS — R2681 Unsteadiness on feet: Secondary | ICD-10-CM | POA: Diagnosis not present

## 2014-10-02 DIAGNOSIS — I5040 Unspecified combined systolic (congestive) and diastolic (congestive) heart failure: Secondary | ICD-10-CM | POA: Diagnosis not present

## 2014-10-02 DIAGNOSIS — R2681 Unsteadiness on feet: Secondary | ICD-10-CM | POA: Diagnosis not present

## 2014-10-02 DIAGNOSIS — M6281 Muscle weakness (generalized): Secondary | ICD-10-CM | POA: Diagnosis not present

## 2014-10-03 DIAGNOSIS — I5040 Unspecified combined systolic (congestive) and diastolic (congestive) heart failure: Secondary | ICD-10-CM | POA: Diagnosis not present

## 2014-10-03 DIAGNOSIS — R2681 Unsteadiness on feet: Secondary | ICD-10-CM | POA: Diagnosis not present

## 2014-10-03 DIAGNOSIS — Z7901 Long term (current) use of anticoagulants: Secondary | ICD-10-CM | POA: Diagnosis not present

## 2014-10-03 DIAGNOSIS — M6281 Muscle weakness (generalized): Secondary | ICD-10-CM | POA: Diagnosis not present

## 2014-10-06 DIAGNOSIS — R2681 Unsteadiness on feet: Secondary | ICD-10-CM | POA: Diagnosis not present

## 2014-10-06 DIAGNOSIS — I5040 Unspecified combined systolic (congestive) and diastolic (congestive) heart failure: Secondary | ICD-10-CM | POA: Diagnosis not present

## 2014-10-06 DIAGNOSIS — I480 Paroxysmal atrial fibrillation: Secondary | ICD-10-CM | POA: Diagnosis not present

## 2014-10-06 DIAGNOSIS — Z7901 Long term (current) use of anticoagulants: Secondary | ICD-10-CM | POA: Diagnosis not present

## 2014-10-06 DIAGNOSIS — D509 Iron deficiency anemia, unspecified: Secondary | ICD-10-CM | POA: Diagnosis not present

## 2014-10-06 DIAGNOSIS — M6281 Muscle weakness (generalized): Secondary | ICD-10-CM | POA: Diagnosis not present

## 2014-10-06 DIAGNOSIS — Z79899 Other long term (current) drug therapy: Secondary | ICD-10-CM | POA: Diagnosis not present

## 2014-10-06 DIAGNOSIS — N182 Chronic kidney disease, stage 2 (mild): Secondary | ICD-10-CM | POA: Diagnosis not present

## 2014-10-06 DIAGNOSIS — D649 Anemia, unspecified: Secondary | ICD-10-CM | POA: Diagnosis not present

## 2014-10-07 DIAGNOSIS — R2681 Unsteadiness on feet: Secondary | ICD-10-CM | POA: Diagnosis not present

## 2014-10-07 DIAGNOSIS — D649 Anemia, unspecified: Secondary | ICD-10-CM | POA: Diagnosis not present

## 2014-10-07 DIAGNOSIS — K5900 Constipation, unspecified: Secondary | ICD-10-CM | POA: Diagnosis not present

## 2014-10-07 DIAGNOSIS — M6281 Muscle weakness (generalized): Secondary | ICD-10-CM | POA: Diagnosis not present

## 2014-10-07 DIAGNOSIS — I5040 Unspecified combined systolic (congestive) and diastolic (congestive) heart failure: Secondary | ICD-10-CM | POA: Diagnosis not present

## 2014-10-07 DIAGNOSIS — E875 Hyperkalemia: Secondary | ICD-10-CM | POA: Diagnosis not present

## 2014-10-07 DIAGNOSIS — K219 Gastro-esophageal reflux disease without esophagitis: Secondary | ICD-10-CM | POA: Diagnosis not present

## 2014-10-08 DIAGNOSIS — L97529 Non-pressure chronic ulcer of other part of left foot with unspecified severity: Secondary | ICD-10-CM | POA: Diagnosis not present

## 2014-10-08 DIAGNOSIS — M6281 Muscle weakness (generalized): Secondary | ICD-10-CM | POA: Diagnosis not present

## 2014-10-08 DIAGNOSIS — R2681 Unsteadiness on feet: Secondary | ICD-10-CM | POA: Diagnosis not present

## 2014-10-08 DIAGNOSIS — I5040 Unspecified combined systolic (congestive) and diastolic (congestive) heart failure: Secondary | ICD-10-CM | POA: Diagnosis not present

## 2014-10-09 DIAGNOSIS — I5040 Unspecified combined systolic (congestive) and diastolic (congestive) heart failure: Secondary | ICD-10-CM | POA: Diagnosis not present

## 2014-10-09 DIAGNOSIS — R2681 Unsteadiness on feet: Secondary | ICD-10-CM | POA: Diagnosis not present

## 2014-10-09 DIAGNOSIS — M6281 Muscle weakness (generalized): Secondary | ICD-10-CM | POA: Diagnosis not present

## 2014-10-10 DIAGNOSIS — I480 Paroxysmal atrial fibrillation: Secondary | ICD-10-CM | POA: Diagnosis not present

## 2014-10-10 DIAGNOSIS — A047 Enterocolitis due to Clostridium difficile: Secondary | ICD-10-CM | POA: Diagnosis not present

## 2014-10-10 DIAGNOSIS — R2681 Unsteadiness on feet: Secondary | ICD-10-CM | POA: Diagnosis not present

## 2014-10-10 DIAGNOSIS — M6281 Muscle weakness (generalized): Secondary | ICD-10-CM | POA: Diagnosis not present

## 2014-10-10 DIAGNOSIS — Z7901 Long term (current) use of anticoagulants: Secondary | ICD-10-CM | POA: Diagnosis not present

## 2014-10-10 DIAGNOSIS — I4891 Unspecified atrial fibrillation: Secondary | ICD-10-CM | POA: Diagnosis not present

## 2014-10-10 DIAGNOSIS — I5040 Unspecified combined systolic (congestive) and diastolic (congestive) heart failure: Secondary | ICD-10-CM | POA: Diagnosis not present

## 2014-10-13 DIAGNOSIS — D649 Anemia, unspecified: Secondary | ICD-10-CM | POA: Diagnosis not present

## 2014-10-13 DIAGNOSIS — N189 Chronic kidney disease, unspecified: Secondary | ICD-10-CM | POA: Diagnosis not present

## 2014-10-13 DIAGNOSIS — D509 Iron deficiency anemia, unspecified: Secondary | ICD-10-CM | POA: Diagnosis not present

## 2014-10-13 DIAGNOSIS — N182 Chronic kidney disease, stage 2 (mild): Secondary | ICD-10-CM | POA: Diagnosis not present

## 2014-10-13 DIAGNOSIS — I5040 Unspecified combined systolic (congestive) and diastolic (congestive) heart failure: Secondary | ICD-10-CM | POA: Diagnosis not present

## 2014-10-13 DIAGNOSIS — R2681 Unsteadiness on feet: Secondary | ICD-10-CM | POA: Diagnosis not present

## 2014-10-13 DIAGNOSIS — Z79899 Other long term (current) drug therapy: Secondary | ICD-10-CM | POA: Diagnosis not present

## 2014-10-13 DIAGNOSIS — M6281 Muscle weakness (generalized): Secondary | ICD-10-CM | POA: Diagnosis not present

## 2014-10-13 DIAGNOSIS — I480 Paroxysmal atrial fibrillation: Secondary | ICD-10-CM | POA: Diagnosis not present

## 2014-10-13 DIAGNOSIS — E875 Hyperkalemia: Secondary | ICD-10-CM | POA: Diagnosis not present

## 2014-10-14 DIAGNOSIS — M6281 Muscle weakness (generalized): Secondary | ICD-10-CM | POA: Diagnosis not present

## 2014-10-14 DIAGNOSIS — Z79899 Other long term (current) drug therapy: Secondary | ICD-10-CM | POA: Diagnosis not present

## 2014-10-14 DIAGNOSIS — D649 Anemia, unspecified: Secondary | ICD-10-CM | POA: Diagnosis not present

## 2014-10-14 DIAGNOSIS — I5040 Unspecified combined systolic (congestive) and diastolic (congestive) heart failure: Secondary | ICD-10-CM | POA: Diagnosis not present

## 2014-10-14 DIAGNOSIS — E875 Hyperkalemia: Secondary | ICD-10-CM | POA: Diagnosis not present

## 2014-10-14 DIAGNOSIS — N182 Chronic kidney disease, stage 2 (mild): Secondary | ICD-10-CM | POA: Diagnosis not present

## 2014-10-14 DIAGNOSIS — R2681 Unsteadiness on feet: Secondary | ICD-10-CM | POA: Diagnosis not present

## 2014-10-15 DIAGNOSIS — R2681 Unsteadiness on feet: Secondary | ICD-10-CM | POA: Diagnosis not present

## 2014-10-15 DIAGNOSIS — M6281 Muscle weakness (generalized): Secondary | ICD-10-CM | POA: Diagnosis not present

## 2014-10-15 DIAGNOSIS — I5040 Unspecified combined systolic (congestive) and diastolic (congestive) heart failure: Secondary | ICD-10-CM | POA: Diagnosis not present

## 2014-10-15 DIAGNOSIS — L97529 Non-pressure chronic ulcer of other part of left foot with unspecified severity: Secondary | ICD-10-CM | POA: Diagnosis not present

## 2014-10-16 DIAGNOSIS — I5040 Unspecified combined systolic (congestive) and diastolic (congestive) heart failure: Secondary | ICD-10-CM | POA: Diagnosis not present

## 2014-10-16 DIAGNOSIS — R2681 Unsteadiness on feet: Secondary | ICD-10-CM | POA: Diagnosis not present

## 2014-10-16 DIAGNOSIS — M6281 Muscle weakness (generalized): Secondary | ICD-10-CM | POA: Diagnosis not present

## 2014-10-17 DIAGNOSIS — I5040 Unspecified combined systolic (congestive) and diastolic (congestive) heart failure: Secondary | ICD-10-CM | POA: Diagnosis not present

## 2014-10-17 DIAGNOSIS — M6281 Muscle weakness (generalized): Secondary | ICD-10-CM | POA: Diagnosis not present

## 2014-10-17 DIAGNOSIS — R2681 Unsteadiness on feet: Secondary | ICD-10-CM | POA: Diagnosis not present

## 2014-10-17 DIAGNOSIS — I480 Paroxysmal atrial fibrillation: Secondary | ICD-10-CM | POA: Diagnosis not present

## 2014-10-17 DIAGNOSIS — Z7901 Long term (current) use of anticoagulants: Secondary | ICD-10-CM | POA: Diagnosis not present

## 2014-10-20 DIAGNOSIS — N189 Chronic kidney disease, unspecified: Secondary | ICD-10-CM | POA: Diagnosis not present

## 2014-10-20 DIAGNOSIS — D509 Iron deficiency anemia, unspecified: Secondary | ICD-10-CM | POA: Diagnosis not present

## 2014-10-20 DIAGNOSIS — I5040 Unspecified combined systolic (congestive) and diastolic (congestive) heart failure: Secondary | ICD-10-CM | POA: Diagnosis not present

## 2014-10-20 DIAGNOSIS — M129 Arthropathy, unspecified: Secondary | ICD-10-CM | POA: Diagnosis not present

## 2014-10-20 DIAGNOSIS — M6281 Muscle weakness (generalized): Secondary | ICD-10-CM | POA: Diagnosis not present

## 2014-10-20 DIAGNOSIS — N182 Chronic kidney disease, stage 2 (mild): Secondary | ICD-10-CM | POA: Diagnosis not present

## 2014-10-20 DIAGNOSIS — D649 Anemia, unspecified: Secondary | ICD-10-CM | POA: Diagnosis not present

## 2014-10-20 DIAGNOSIS — R6889 Other general symptoms and signs: Secondary | ICD-10-CM | POA: Diagnosis not present

## 2014-10-20 DIAGNOSIS — Z79899 Other long term (current) drug therapy: Secondary | ICD-10-CM | POA: Diagnosis not present

## 2014-10-20 DIAGNOSIS — I480 Paroxysmal atrial fibrillation: Secondary | ICD-10-CM | POA: Diagnosis not present

## 2014-10-20 DIAGNOSIS — R2681 Unsteadiness on feet: Secondary | ICD-10-CM | POA: Diagnosis not present

## 2014-10-21 DIAGNOSIS — R2681 Unsteadiness on feet: Secondary | ICD-10-CM | POA: Diagnosis not present

## 2014-10-21 DIAGNOSIS — M6281 Muscle weakness (generalized): Secondary | ICD-10-CM | POA: Diagnosis not present

## 2014-10-21 DIAGNOSIS — D649 Anemia, unspecified: Secondary | ICD-10-CM | POA: Diagnosis not present

## 2014-10-21 DIAGNOSIS — I480 Paroxysmal atrial fibrillation: Secondary | ICD-10-CM | POA: Diagnosis not present

## 2014-10-21 DIAGNOSIS — Z7901 Long term (current) use of anticoagulants: Secondary | ICD-10-CM | POA: Diagnosis not present

## 2014-10-21 DIAGNOSIS — I5040 Unspecified combined systolic (congestive) and diastolic (congestive) heart failure: Secondary | ICD-10-CM | POA: Diagnosis not present

## 2014-10-22 DIAGNOSIS — L97519 Non-pressure chronic ulcer of other part of right foot with unspecified severity: Secondary | ICD-10-CM | POA: Diagnosis not present

## 2014-10-22 DIAGNOSIS — I5040 Unspecified combined systolic (congestive) and diastolic (congestive) heart failure: Secondary | ICD-10-CM | POA: Diagnosis not present

## 2014-10-22 DIAGNOSIS — M6281 Muscle weakness (generalized): Secondary | ICD-10-CM | POA: Diagnosis not present

## 2014-10-22 DIAGNOSIS — R2681 Unsteadiness on feet: Secondary | ICD-10-CM | POA: Diagnosis not present

## 2014-10-23 DIAGNOSIS — I5040 Unspecified combined systolic (congestive) and diastolic (congestive) heart failure: Secondary | ICD-10-CM | POA: Diagnosis not present

## 2014-10-23 DIAGNOSIS — R2681 Unsteadiness on feet: Secondary | ICD-10-CM | POA: Diagnosis not present

## 2014-10-23 DIAGNOSIS — M6281 Muscle weakness (generalized): Secondary | ICD-10-CM | POA: Diagnosis not present

## 2014-10-24 DIAGNOSIS — D649 Anemia, unspecified: Secondary | ICD-10-CM | POA: Diagnosis not present

## 2014-10-24 DIAGNOSIS — I482 Chronic atrial fibrillation: Secondary | ICD-10-CM | POA: Diagnosis not present

## 2014-10-24 DIAGNOSIS — I480 Paroxysmal atrial fibrillation: Secondary | ICD-10-CM | POA: Diagnosis not present

## 2014-10-24 DIAGNOSIS — R2681 Unsteadiness on feet: Secondary | ICD-10-CM | POA: Diagnosis not present

## 2014-10-24 DIAGNOSIS — Z7901 Long term (current) use of anticoagulants: Secondary | ICD-10-CM | POA: Diagnosis not present

## 2014-10-24 DIAGNOSIS — M6281 Muscle weakness (generalized): Secondary | ICD-10-CM | POA: Diagnosis not present

## 2014-10-24 DIAGNOSIS — I5040 Unspecified combined systolic (congestive) and diastolic (congestive) heart failure: Secondary | ICD-10-CM | POA: Diagnosis not present

## 2014-10-25 ENCOUNTER — Encounter (HOSPITAL_COMMUNITY): Payer: Self-pay | Admitting: Emergency Medicine

## 2014-10-25 ENCOUNTER — Emergency Department (HOSPITAL_COMMUNITY)
Admission: EM | Admit: 2014-10-25 | Discharge: 2014-10-25 | Disposition: A | Discharge status: Other Acute Inpatient Hospital | Payer: Medicare Other | Attending: Emergency Medicine | Admitting: Emergency Medicine

## 2014-10-25 ENCOUNTER — Emergency Department (HOSPITAL_COMMUNITY): Payer: Medicare Other

## 2014-10-25 DIAGNOSIS — I5042 Chronic combined systolic (congestive) and diastolic (congestive) heart failure: Secondary | ICD-10-CM | POA: Insufficient documentation

## 2014-10-25 DIAGNOSIS — I129 Hypertensive chronic kidney disease with stage 1 through stage 4 chronic kidney disease, or unspecified chronic kidney disease: Secondary | ICD-10-CM | POA: Insufficient documentation

## 2014-10-25 DIAGNOSIS — Z7901 Long term (current) use of anticoagulants: Secondary | ICD-10-CM | POA: Insufficient documentation

## 2014-10-25 DIAGNOSIS — Z79899 Other long term (current) drug therapy: Secondary | ICD-10-CM | POA: Insufficient documentation

## 2014-10-25 DIAGNOSIS — I252 Old myocardial infarction: Secondary | ICD-10-CM | POA: Insufficient documentation

## 2014-10-25 DIAGNOSIS — N182 Chronic kidney disease, stage 2 (mild): Secondary | ICD-10-CM | POA: Insufficient documentation

## 2014-10-25 DIAGNOSIS — Z7982 Long term (current) use of aspirin: Secondary | ICD-10-CM | POA: Insufficient documentation

## 2014-10-25 DIAGNOSIS — E785 Hyperlipidemia, unspecified: Secondary | ICD-10-CM | POA: Insufficient documentation

## 2014-10-25 DIAGNOSIS — Z792 Long term (current) use of antibiotics: Secondary | ICD-10-CM | POA: Insufficient documentation

## 2014-10-25 DIAGNOSIS — M109 Gout, unspecified: Secondary | ICD-10-CM | POA: Diagnosis not present

## 2014-10-25 DIAGNOSIS — I77810 Thoracic aortic ectasia: Secondary | ICD-10-CM | POA: Diagnosis not present

## 2014-10-25 DIAGNOSIS — Z87891 Personal history of nicotine dependence: Secondary | ICD-10-CM | POA: Insufficient documentation

## 2014-10-25 DIAGNOSIS — Z791 Long term (current) use of non-steroidal anti-inflammatories (NSAID): Secondary | ICD-10-CM | POA: Diagnosis not present

## 2014-10-25 DIAGNOSIS — Z8719 Personal history of other diseases of the digestive system: Secondary | ICD-10-CM | POA: Insufficient documentation

## 2014-10-25 DIAGNOSIS — R404 Transient alteration of awareness: Secondary | ICD-10-CM | POA: Diagnosis not present

## 2014-10-25 DIAGNOSIS — Z7951 Long term (current) use of inhaled steroids: Secondary | ICD-10-CM | POA: Insufficient documentation

## 2014-10-25 DIAGNOSIS — Z8673 Personal history of transient ischemic attack (TIA), and cerebral infarction without residual deficits: Secondary | ICD-10-CM | POA: Diagnosis not present

## 2014-10-25 DIAGNOSIS — R0902 Hypoxemia: Secondary | ICD-10-CM | POA: Diagnosis not present

## 2014-10-25 DIAGNOSIS — Z862 Personal history of diseases of the blood and blood-forming organs and certain disorders involving the immune mechanism: Secondary | ICD-10-CM | POA: Insufficient documentation

## 2014-10-25 DIAGNOSIS — R279 Unspecified lack of coordination: Secondary | ICD-10-CM | POA: Diagnosis not present

## 2014-10-25 DIAGNOSIS — I517 Cardiomegaly: Secondary | ICD-10-CM | POA: Diagnosis not present

## 2014-10-25 DIAGNOSIS — Z743 Need for continuous supervision: Secondary | ICD-10-CM | POA: Diagnosis not present

## 2014-10-25 DIAGNOSIS — R55 Syncope and collapse: Secondary | ICD-10-CM | POA: Diagnosis present

## 2014-10-25 LAB — URINALYSIS, ROUTINE W REFLEX MICROSCOPIC
Bilirubin Urine: NEGATIVE
GLUCOSE, UA: NEGATIVE mg/dL
HGB URINE DIPSTICK: NEGATIVE
KETONES UR: NEGATIVE mg/dL
Leukocytes, UA: NEGATIVE
Nitrite: NEGATIVE
PROTEIN: NEGATIVE mg/dL
Specific Gravity, Urine: 1.01 (ref 1.005–1.030)
UROBILINOGEN UA: 0.2 mg/dL (ref 0.0–1.0)
pH: 6 (ref 5.0–8.0)

## 2014-10-25 LAB — CBC WITH DIFFERENTIAL/PLATELET
BASOS ABS: 0 10*3/uL (ref 0.0–0.1)
BASOS PCT: 1 % (ref 0–1)
EOS ABS: 0.1 10*3/uL (ref 0.0–0.7)
EOS PCT: 2 % (ref 0–5)
HEMATOCRIT: 41.7 % (ref 36.0–46.0)
Hemoglobin: 13.4 g/dL (ref 12.0–15.0)
LYMPHS PCT: 23 % (ref 12–46)
Lymphs Abs: 1.2 10*3/uL (ref 0.7–4.0)
MCH: 32.2 pg (ref 26.0–34.0)
MCHC: 32.1 g/dL (ref 30.0–36.0)
MCV: 100.2 fL — AB (ref 78.0–100.0)
MONO ABS: 0.4 10*3/uL (ref 0.1–1.0)
Monocytes Relative: 9 % (ref 3–12)
Neutro Abs: 3.2 10*3/uL (ref 1.7–7.7)
Neutrophils Relative %: 65 % (ref 43–77)
PLATELETS: 187 10*3/uL (ref 150–400)
RBC: 4.16 MIL/uL (ref 3.87–5.11)
RDW: 12.2 % (ref 11.5–15.5)
WBC: 4.9 10*3/uL (ref 4.0–10.5)

## 2014-10-25 LAB — BASIC METABOLIC PANEL
ANION GAP: 12 (ref 5–15)
BUN: 25 mg/dL — ABNORMAL HIGH (ref 6–20)
CALCIUM: 9.9 mg/dL (ref 8.9–10.3)
CO2: 26 mmol/L (ref 22–32)
CREATININE: 1.33 mg/dL — AB (ref 0.44–1.00)
Chloride: 98 mmol/L — ABNORMAL LOW (ref 101–111)
GFR calc Af Amer: 38 mL/min — ABNORMAL LOW (ref >60)
GFR, EST NON AFRICAN AMERICAN: 33 mL/min — AB (ref >60)
Glucose, Bld: 108 mg/dL — ABNORMAL HIGH (ref 65–99)
Potassium: 4.5 mmol/L (ref 3.5–5.1)
SODIUM: 136 mmol/L (ref 135–145)

## 2014-10-25 LAB — MAGNESIUM: Magnesium: 2 mg/dL (ref 1.7–2.4)

## 2014-10-25 MED ORDER — SODIUM CHLORIDE 0.9 % IV BOLUS (SEPSIS)
500.0000 mL | Freq: Once | INTRAVENOUS | Status: AC
  Administered 2014-10-25: 500 mL via INTRAVENOUS

## 2014-10-25 NOTE — ED Notes (Signed)
Per EMS, pt from [facility]. EMS originally called out for cardiac arrest. RN at facility stated pt was asleep with HR of 22. Per nurse, she was unable to awake pt so she did a precordial thump. Pt then awoke. Upon EMS arrival, pt was AOx4. HR is 80s. Pt has not complaints. Denies CP, SOB. Denies any pain. Pt has no other complaints.

## 2014-10-25 NOTE — ED Notes (Addendum)
Offered to re-dress pt's wound on RT great toe. Pt's family refused stating that [facility] will re-dress it when patient returns. RN Marijean Niemann at [facility] made aware.

## 2014-10-25 NOTE — ED Provider Notes (Signed)
CSN: [id number]     Arrival date & time 10/25/14  1504 History   First MD Initiated Contact with Patient 10/25/14 1515     Chief Complaint  Patient presents with  . Bradycardia     (Consider location/radiation/quality/duration/timing/severity/associated sxs/prior Treatment) Patient is a 81 y.o. female presenting with general illness. The history is provided by the patient, a relative and the EMS personnel.  Illness Severity:  Severe Onset quality:  Sudden Duration:  1 day Timing:  Rare Progression:  Resolved Chronicity:  Recurrent Associated symptoms: no abdominal pain, no chest pain, no congestion, no cough, no fever, no headaches, no myalgias, no nausea, no rhinorrhea, no shortness of breath, no vomiting and no wheezing    81 yo F with a chief complaint of syncope. Per EMS patient was initially called out as CPR in progress. When arrived on the scene was told that the patient was bradycardic determined by auscultation. She was given 3 precordial thump back to back. After which she regained consciousness and was back to her baseline. Per family she has had a couple of events similar to this when she was given pain medicine for her right foot with chronic wound. Family denies cough congestion fevers dysuria abdominal pain chest pain headache.  Past Medical History  Diagnosis Date  . Hyperlipidemia   . Osteoporosis   . Hypertension     with severe left ventricular hypertrophy; normal ejection fraction in 04/2005  . Valvular heart disease     Mild to moderate aortic stenosis; moderate to severe mitral stenosis in 2007; mild MR  . Paroxysmal atrial fibrillation     Remote  . Diverticulosis     Pancolonic; h/o LGI bleeding and diverticulitis  . Degenerative joint disease     Knees  . Chronic kidney disease     Mild; creatinine of 1.19-1.28 in recent years  . Anemia, iron deficiency   . Popliteal cyst     Left  . Fracture of wrist 2008    left  . Gout   . Stroke right side  deficit  . NSTEMI, initial episode of care 01/05/2012  . Chronic combined systolic and diastolic congestive heart failure 02/2013    EF 30%; Grade 2 diastolic dys   Past Surgical History  Procedure Laterality Date  . Appendectomy  1944  . Total hip arthroplasty  1994   Family History  Problem Relation Age of Onset  . Diabetes Mother    History  Substance Use Topics  . Smoking status: Former Smoker    Types: Cigarettes    Quit date: 08/31/1972  . Smokeless tobacco: Never Used  . Alcohol Use: No   OB History    No data available     Review of Systems  Constitutional: Negative for fever and chills.  HENT: Negative for congestion and rhinorrhea.   Eyes: Negative for redness and visual disturbance.  Respiratory: Negative for cough, shortness of breath and wheezing.   Cardiovascular: Negative for chest pain and palpitations.  Gastrointestinal: Negative for nausea, vomiting and abdominal pain.  Genitourinary: Negative for dysuria and urgency.  Musculoskeletal: Negative for myalgias and arthralgias.  Skin: Negative for pallor and wound.  Neurological: Negative for dizziness and headaches.      Allergies  Review of patient's allergies indicates no known allergies.  Home Medications   Prior to Admission medications   Medication Sig Start Date End Date Taking? Authorizing Provider  acetaminophen (TYLENOL) 500 MG tablet Take 1 tablet (500 mg total) by mouth every  8 (eight) hours. 02/12/14  Yes Lesle Chris Black, NP  alendronate (FOSAMAX) 70 MG tablet TAKE 1 TAB EACH WEEK 30 MIN PRIOR TO BREAKFAST WITH LARGE GLASS OF WATER. REMAIN UPRIGHT. Patient taking differently: TAKE 1 TAB EACH WEEK 30 MIN PRIOR TO BREAKFAST WITH LARGE GLASS OF WATER. REMAIN UPRIGHT. Takes on Friday 12/06/13  Yes Kerri Perches, MD  allopurinol (ZYLOPRIM) 100 MG tablet TAKE ONE TABLET BY MOUTH ONCE DAILY. 10/28/13  Yes Kerri Perches, MD  Amino Acids-Protein Hydrolys (FEEDING SUPPLEMENT, PRO-STAT SUGAR  FREE 64,) LIQD Take 30 mLs by mouth 3 (three) times daily with meals.   Yes Historical Provider, MD  atorvastatin (LIPITOR) 10 MG tablet Take 10 mg by mouth daily.   Yes Historical Provider, MD  feeding supplement, ENSURE COMPLETE, (ENSURE COMPLETE) LIQD Take 237 mLs by mouth 2 (two) times daily between meals. 02/05/14  Yes Lesle Chris Black, NP  fluticasone (FLONASE) 50 MCG/ACT nasal spray USE 1 SPRAY IN EACH NOSTRIL ONCE DAILY. 07/23/13  Yes Kerri Perches, MD  furosemide (LASIX) 40 MG tablet TAKE ONE TABLET BY MOUTH ONCE DAILY. 12/24/13  Yes Laqueta Linden, MD  losartan (COZAAR) 50 MG tablet Take 50 mg by mouth daily.   Yes Historical Provider, MD  metoprolol (LOPRESSOR) 50 MG tablet Take 50-75 mg by mouth 2 (two) times daily. 75 mg each morning and 50 mg at bedtime.   Yes Historical Provider, MD  pantoprazole (PROTONIX) 20 MG tablet Take 20 mg by mouth 2 (two) times daily.   Yes Historical Provider, MD  senna-docusate (SENOKOT-S) 8.6-50 MG per tablet Take 1 tablet by mouth 2 (two) times daily.   Yes Historical Provider, MD  traMADol (ULTRAM) 50 MG tablet Take 50 mg by mouth every 6 (six) hours.   Yes Historical Provider, MD  vitamin C (ASCORBIC ACID) 500 MG tablet Take 500 mg by mouth daily.   Yes Historical Provider, MD  warfarin (COUMADIN) 2.5 MG tablet Take 2.5 mg by mouth daily.   Yes Historical Provider, MD  Wheat Dextrin (BENEFIBER PO) Take 1 each by mouth daily.   Yes Historical Provider, MD  ASPIRIN LOW DOSE 81 MG EC tablet TAKE ONE TABLET BY MOUTH ONCE DAILY. 10/28/13   Kerri Perches, MD  benzonatate (TESSALON) 100 MG capsule Take 1 capsule (100 mg total) by mouth 3 (three) times daily as needed for cough. 02/05/14   Gwenyth Bender, NP  Calcium Carb-Cholecalciferol 500-400 MG-UNIT TABS TAKE (1) TABLET BY MOUTH (3) TIMES DAILY. 07/24/13   Kerri Perches, MD  cefUROXime (CEFTIN) 250 MG tablet Take 1 tablet (250 mg total) by mouth 2 (two) times daily with a meal. 02/12/14   Gwenyth Bender, NP  COLACE 50 MG capsule TAKE (1) CAPSULE BY MOUTH TWICE DAILY. 10/28/13   Kerri Perches, MD  diclofenac sodium (VOLTAREN) 1 % GEL Apply 2 g topically 3 (three) times daily. 02/12/14   Gwenyth Bender, NP  HYDROcodone-acetaminophen (NORCO/VICODIN) 5-325 MG per tablet Take 1 tablet by mouth every 6 (six) hours as needed (pain). 02/12/14   Gwenyth Bender, NP  menthol-cetylpyridinium (CEPACOL) 3 MG lozenge Take 1 lozenge (3 mg total) by mouth as needed for sore throat. 02/05/14   Gwenyth Bender, NP  potassium chloride (K-DUR) 10 MEQ tablet TAKE 1 TABLET BY MOUTH ON MONDAY,WEDNESDAY AND FRIDAY. 10/28/13   Kerri Perches, MD  simvastatin (ZOCOR) 20 MG tablet TAKE (1) TABLET BY MOUTH AT BEDTIME FOR CHOLESTEROL. 10/28/13  Kerri Perches, MD  valsartan (DIOVAN) 80 MG tablet Take 1 tablet by mouth daily. 01/28/14   Historical Provider, MD  warfarin (COUMADIN) 1 MG tablet TAKE 1 TABLET BY MOUTH ONCE A DAY,ALONG WITH 2.5 MG DAILY TO EQUAL 3.5 MG. Patient not taking: Reported on 02/11/2014 10/28/13   Jodelle Gross, NP  warfarin (COUMADIN) 2.5 MG tablet TAKE 1 TABLET BY MOUTH ONCE A DAY,ALONG WITH 1 MG TO EQUAL 3.5 MG DAILY. Patient not taking: Reported on 02/11/2014 12/23/13   Laqueta Linden, MD   BP 156/97 mmHg  Pulse 92  Temp(Src) 97.6 F (36.4 C) (Oral)  Resp 17  SpO2 95% Physical Exam  Constitutional: She is oriented to person, place, and time. She appears well-developed and well-nourished. No distress.  HENT:  Head: Normocephalic and atraumatic.  Eyes: EOM are normal. Pupils are equal, round, and reactive to light.  Neck: Normal range of motion. Neck supple.  Cardiovascular: Normal rate and regular rhythm.  Exam reveals no gallop and no friction rub.   No murmur heard. Pulmonary/Chest: Effort normal. She has no wheezes. She has no rales.  Abdominal: Soft. She exhibits no distension. There is no tenderness.  Musculoskeletal: She exhibits no edema or tenderness.  Neurological:  She is alert and oriented to person, place, and time.  Skin: Skin is warm and dry. She is not diaphoretic.  Chronic appearing wound to the right first and second digit of the foot. Improved per family.  Psychiatric: She has a normal mood and affect. Her behavior is normal.    ED Course  Procedures (including critical care time) Labs Review Labs Reviewed  CBC WITH DIFFERENTIAL/PLATELET - Abnormal; Notable for the following:    MCV 100.2 (*)    All other components within normal limits  BASIC METABOLIC PANEL - Abnormal; Notable for the following:    Chloride 98 (*)    Glucose, Bld 108 (*)    BUN 25 (*)    Creatinine, Ser 1.33 (*)    GFR calc non Af Amer 33 (*)    GFR calc Af Amer 38 (*)    All other components within normal limits  URINALYSIS, ROUTINE W REFLEX MICROSCOPIC (NOT AT [facility])  MAGNESIUM    Imaging Review Dg Chest 1 View  10/25/2014   CLINICAL DATA:  Syncope.  EXAM: CHEST  1 VIEW  COMPARISON:  02/11/2014  FINDINGS: Moderate cardiomegaly and ectasia of the thoracic aorta are stable. Both lungs are clear. No evidence of pneumothorax or pleural effusion.  IMPRESSION: Stable cardiomegaly.  No active lung disease.   Electronically Signed   By: Myles Rosenthal M.D.   On: 10/25/2014 15:55     EKG Interpretation   Date/Time:  Saturday October 25 2014 16:37:28 EDT Ventricular Rate:  79 PR Interval:    QRS Duration: 82 QT Interval:  382 QTC Calculation: 438 R Axis:   48 Text Interpretation:  Atrial fibrillation Probable anteroseptal infarct,  recent No significant change was found Confirmed by Amorette Charrette MD, Reuel Boom  ([id number]) on 10/25/2014 5:20:35 PM      MDM   Final diagnoses:  Transient alteration of awareness    81 yo F with chief complaints of possible syncope. Only recordable rhythm was in the 80s in sinus. ECG here unchanged from prior. Patient with mild bump in creatinine. Given small fluid bolus.  Chest x-ray urine unremarkable for infection. CBC without acute abnormality.  BMP with mild bump in creatinine. Discussed results with family risk benefits of admission weighed patient  will go back to the nursing home.  6:58 PM:  I have discussed the diagnosis/risks/treatment options with the patient and family and believe the pt to be eligible for discharge home to follow-up with PCP. We also discussed returning to the ED immediately if new or worsening sx occur. We discussed the sx which are most concerning (e.g., repeat episode) that necessitate immediate return. Medications administered to the patient during their visit and any new prescriptions provided to the patient are listed below.  Medications given during this visit Medications  sodium chloride 0.9 % bolus 500 mL (0 mLs Intravenous Stopped 10/25/14 1746)    New Prescriptions   No medications on file     The patient appears reasonably screen and/or stabilized for discharge and I doubt any other medical condition or other [facility] requiring further screening, evaluation, or treatment in the ED at this time prior to discharge.    Melene Plan, DO 10/25/14 1859

## 2014-10-25 NOTE — Discharge Instructions (Signed)

## 2014-10-27 DIAGNOSIS — D509 Iron deficiency anemia, unspecified: Secondary | ICD-10-CM | POA: Diagnosis not present

## 2014-10-27 DIAGNOSIS — D649 Anemia, unspecified: Secondary | ICD-10-CM | POA: Diagnosis not present

## 2014-10-27 DIAGNOSIS — I480 Paroxysmal atrial fibrillation: Secondary | ICD-10-CM | POA: Diagnosis not present

## 2014-10-27 DIAGNOSIS — R001 Bradycardia, unspecified: Secondary | ICD-10-CM | POA: Diagnosis not present

## 2014-10-27 DIAGNOSIS — L89609 Pressure ulcer of unspecified heel, unspecified stage: Secondary | ICD-10-CM | POA: Diagnosis not present

## 2014-10-27 DIAGNOSIS — I5040 Unspecified combined systolic (congestive) and diastolic (congestive) heart failure: Secondary | ICD-10-CM | POA: Diagnosis not present

## 2014-10-27 DIAGNOSIS — Z79899 Other long term (current) drug therapy: Secondary | ICD-10-CM | POA: Diagnosis not present

## 2014-10-27 DIAGNOSIS — M6281 Muscle weakness (generalized): Secondary | ICD-10-CM | POA: Diagnosis not present

## 2014-10-27 DIAGNOSIS — Z7901 Long term (current) use of anticoagulants: Secondary | ICD-10-CM | POA: Diagnosis not present

## 2014-10-27 DIAGNOSIS — N182 Chronic kidney disease, stage 2 (mild): Secondary | ICD-10-CM | POA: Diagnosis not present

## 2014-10-28 DIAGNOSIS — I959 Hypotension, unspecified: Secondary | ICD-10-CM | POA: Diagnosis not present

## 2014-10-28 DIAGNOSIS — M6281 Muscle weakness (generalized): Secondary | ICD-10-CM | POA: Diagnosis not present

## 2014-10-28 DIAGNOSIS — I4891 Unspecified atrial fibrillation: Secondary | ICD-10-CM | POA: Diagnosis not present

## 2014-10-28 DIAGNOSIS — R6889 Other general symptoms and signs: Secondary | ICD-10-CM | POA: Diagnosis not present

## 2014-10-28 DIAGNOSIS — I5030 Unspecified diastolic (congestive) heart failure: Secondary | ICD-10-CM | POA: Diagnosis not present

## 2014-10-28 DIAGNOSIS — I482 Chronic atrial fibrillation: Secondary | ICD-10-CM | POA: Diagnosis not present

## 2014-10-28 DIAGNOSIS — L03031 Cellulitis of right toe: Secondary | ICD-10-CM | POA: Diagnosis not present

## 2014-10-28 DIAGNOSIS — I5040 Unspecified combined systolic (congestive) and diastolic (congestive) heart failure: Secondary | ICD-10-CM | POA: Diagnosis not present

## 2014-10-28 DIAGNOSIS — M25571 Pain in right ankle and joints of right foot: Secondary | ICD-10-CM | POA: Diagnosis not present

## 2014-10-29 DIAGNOSIS — I5040 Unspecified combined systolic (congestive) and diastolic (congestive) heart failure: Secondary | ICD-10-CM | POA: Diagnosis not present

## 2014-10-29 DIAGNOSIS — L97529 Non-pressure chronic ulcer of other part of left foot with unspecified severity: Secondary | ICD-10-CM | POA: Diagnosis not present

## 2014-10-29 DIAGNOSIS — M6281 Muscle weakness (generalized): Secondary | ICD-10-CM | POA: Diagnosis not present

## 2014-10-30 DIAGNOSIS — I5040 Unspecified combined systolic (congestive) and diastolic (congestive) heart failure: Secondary | ICD-10-CM | POA: Diagnosis not present

## 2014-10-30 DIAGNOSIS — M6281 Muscle weakness (generalized): Secondary | ICD-10-CM | POA: Diagnosis not present

## 2014-10-31 DIAGNOSIS — L03031 Cellulitis of right toe: Secondary | ICD-10-CM | POA: Diagnosis not present

## 2014-10-31 DIAGNOSIS — I480 Paroxysmal atrial fibrillation: Secondary | ICD-10-CM | POA: Diagnosis not present

## 2014-10-31 DIAGNOSIS — Z7901 Long term (current) use of anticoagulants: Secondary | ICD-10-CM | POA: Diagnosis not present

## 2014-10-31 DIAGNOSIS — I959 Hypotension, unspecified: Secondary | ICD-10-CM | POA: Diagnosis not present

## 2014-10-31 DIAGNOSIS — I5022 Chronic systolic (congestive) heart failure: Secondary | ICD-10-CM | POA: Diagnosis not present

## 2014-10-31 DIAGNOSIS — I482 Chronic atrial fibrillation: Secondary | ICD-10-CM | POA: Diagnosis not present

## 2014-10-31 DIAGNOSIS — M6281 Muscle weakness (generalized): Secondary | ICD-10-CM | POA: Diagnosis not present

## 2014-10-31 DIAGNOSIS — I5040 Unspecified combined systolic (congestive) and diastolic (congestive) heart failure: Secondary | ICD-10-CM | POA: Diagnosis not present

## 2014-11-02 ENCOUNTER — Emergency Department (HOSPITAL_COMMUNITY): Payer: Medicare Other

## 2014-11-02 ENCOUNTER — Encounter (HOSPITAL_COMMUNITY): Payer: Self-pay | Admitting: Emergency Medicine

## 2014-11-02 ENCOUNTER — Emergency Department (HOSPITAL_COMMUNITY)
Admission: EM | Admit: 2014-11-02 | Discharge: 2014-11-02 | Disposition: A | Discharge status: Home / Self Care | Payer: Medicare Other | Attending: Emergency Medicine | Admitting: Emergency Medicine

## 2014-11-02 DIAGNOSIS — E785 Hyperlipidemia, unspecified: Secondary | ICD-10-CM | POA: Insufficient documentation

## 2014-11-02 DIAGNOSIS — M25531 Pain in right wrist: Secondary | ICD-10-CM | POA: Diagnosis not present

## 2014-11-02 DIAGNOSIS — Z743 Need for continuous supervision: Secondary | ICD-10-CM | POA: Diagnosis not present

## 2014-11-02 DIAGNOSIS — N189 Chronic kidney disease, unspecified: Secondary | ICD-10-CM | POA: Insufficient documentation

## 2014-11-02 DIAGNOSIS — Y998 Other external cause status: Secondary | ICD-10-CM | POA: Insufficient documentation

## 2014-11-02 DIAGNOSIS — Z79899 Other long term (current) drug therapy: Secondary | ICD-10-CM | POA: Diagnosis not present

## 2014-11-02 DIAGNOSIS — I252 Old myocardial infarction: Secondary | ICD-10-CM | POA: Insufficient documentation

## 2014-11-02 DIAGNOSIS — Z862 Personal history of diseases of the blood and blood-forming organs and certain disorders involving the immune mechanism: Secondary | ICD-10-CM | POA: Insufficient documentation

## 2014-11-02 DIAGNOSIS — I129 Hypertensive chronic kidney disease with stage 1 through stage 4 chronic kidney disease, or unspecified chronic kidney disease: Secondary | ICD-10-CM | POA: Diagnosis not present

## 2014-11-02 DIAGNOSIS — M810 Age-related osteoporosis without current pathological fracture: Secondary | ICD-10-CM | POA: Insufficient documentation

## 2014-11-02 DIAGNOSIS — Z8781 Personal history of (healed) traumatic fracture: Secondary | ICD-10-CM | POA: Insufficient documentation

## 2014-11-02 DIAGNOSIS — Z7901 Long term (current) use of anticoagulants: Secondary | ICD-10-CM | POA: Insufficient documentation

## 2014-11-02 DIAGNOSIS — L97529 Non-pressure chronic ulcer of other part of left foot with unspecified severity: Secondary | ICD-10-CM | POA: Insufficient documentation

## 2014-11-02 DIAGNOSIS — I5042 Chronic combined systolic (congestive) and diastolic (congestive) heart failure: Secondary | ICD-10-CM | POA: Insufficient documentation

## 2014-11-02 DIAGNOSIS — L97519 Non-pressure chronic ulcer of other part of right foot with unspecified severity: Secondary | ICD-10-CM | POA: Insufficient documentation

## 2014-11-02 DIAGNOSIS — S0083XA Contusion of other part of head, initial encounter: Secondary | ICD-10-CM | POA: Insufficient documentation

## 2014-11-02 DIAGNOSIS — Z8673 Personal history of transient ischemic attack (TIA), and cerebral infarction without residual deficits: Secondary | ICD-10-CM | POA: Diagnosis not present

## 2014-11-02 DIAGNOSIS — Z791 Long term (current) use of non-steroidal anti-inflammatories (NSAID): Secondary | ICD-10-CM | POA: Insufficient documentation

## 2014-11-02 DIAGNOSIS — M109 Gout, unspecified: Secondary | ICD-10-CM | POA: Diagnosis not present

## 2014-11-02 DIAGNOSIS — Y9289 Other specified places as the place of occurrence of the external cause: Secondary | ICD-10-CM | POA: Insufficient documentation

## 2014-11-02 DIAGNOSIS — M170 Bilateral primary osteoarthritis of knee: Secondary | ICD-10-CM | POA: Diagnosis not present

## 2014-11-02 DIAGNOSIS — Z7951 Long term (current) use of inhaled steroids: Secondary | ICD-10-CM | POA: Diagnosis not present

## 2014-11-02 DIAGNOSIS — S52131A Displaced fracture of neck of right radius, initial encounter for closed fracture: Secondary | ICD-10-CM | POA: Diagnosis not present

## 2014-11-02 DIAGNOSIS — R279 Unspecified lack of coordination: Secondary | ICD-10-CM | POA: Diagnosis not present

## 2014-11-02 DIAGNOSIS — S6992XA Unspecified injury of left wrist, hand and finger(s), initial encounter: Secondary | ICD-10-CM | POA: Diagnosis not present

## 2014-11-02 DIAGNOSIS — Y9389 Activity, other specified: Secondary | ICD-10-CM | POA: Insufficient documentation

## 2014-11-02 DIAGNOSIS — S098XXA Other specified injuries of head, initial encounter: Secondary | ICD-10-CM | POA: Diagnosis not present

## 2014-11-02 DIAGNOSIS — I480 Paroxysmal atrial fibrillation: Secondary | ICD-10-CM | POA: Insufficient documentation

## 2014-11-02 DIAGNOSIS — S59901A Unspecified injury of right elbow, initial encounter: Secondary | ICD-10-CM | POA: Diagnosis not present

## 2014-11-02 DIAGNOSIS — S199XXA Unspecified injury of neck, initial encounter: Secondary | ICD-10-CM | POA: Diagnosis not present

## 2014-11-02 DIAGNOSIS — S4991XA Unspecified injury of right shoulder and upper arm, initial encounter: Secondary | ICD-10-CM | POA: Insufficient documentation

## 2014-11-02 DIAGNOSIS — S6991XA Unspecified injury of right wrist, hand and finger(s), initial encounter: Secondary | ICD-10-CM | POA: Diagnosis present

## 2014-11-02 DIAGNOSIS — M25511 Pain in right shoulder: Secondary | ICD-10-CM | POA: Diagnosis not present

## 2014-11-02 DIAGNOSIS — Z8719 Personal history of other diseases of the digestive system: Secondary | ICD-10-CM | POA: Diagnosis not present

## 2014-11-02 DIAGNOSIS — S0181XA Laceration without foreign body of other part of head, initial encounter: Secondary | ICD-10-CM | POA: Diagnosis not present

## 2014-11-02 DIAGNOSIS — Z87891 Personal history of nicotine dependence: Secondary | ICD-10-CM | POA: Diagnosis not present

## 2014-11-02 DIAGNOSIS — M25521 Pain in right elbow: Secondary | ICD-10-CM | POA: Diagnosis not present

## 2014-11-02 DIAGNOSIS — W050XXA Fall from non-moving wheelchair, initial encounter: Secondary | ICD-10-CM | POA: Insufficient documentation

## 2014-11-02 LAB — PROTIME-INR
INR: 2.8 — AB (ref 0.00–1.49)
Prothrombin Time: 29.1 seconds — ABNORMAL HIGH (ref 11.6–15.2)

## 2014-11-02 NOTE — Discharge Instructions (Signed)
°Emergency Department Resource Guide °1) Find a Doctor and Pay Out of Pocket °Although you won't have to find out who is covered by your insurance plan, it is a good idea to ask around and get recommendations. You will then need to call the office and see if the doctor you have chosen will accept you as a new patient and what types of options they offer for patients who are self-pay. Some doctors offer discounts or will set up payment plans for their patients who do not have insurance, but you will need to ask so you aren't surprised when you get to your appointment. ° °2) Contact Your Local Health Department °Not all health departments have doctors that can see patients for sick visits, but many do, so it is worth a call to see if yours does. If you don't know where your local health department is, you can check in your phone book. The CDC also has a tool to help you locate your state's health department, and many state websites also have listings of all of their local health departments. ° °3) Find a Walk-in Clinic °If your illness is not likely to be very severe or complicated, you may want to try a walk in clinic. These are popping up all over the country in pharmacies, drugstores, and shopping centers. They're usually staffed by nurse practitioners or physician assistants that have been trained to treat common illnesses and complaints. They're usually fairly quick and inexpensive. However, if you have serious medical issues or chronic medical problems, these are probably not your best option. ° °No Primary Care Doctor: °- Call [facility] at  832-8000 - they can help you locate a primary care doctor that  accepts your insurance, provides certain services, etc. °- Physician Referral Service- 1-800-533-3463 ° °Chronic Pain Problems: °Organization         Address  Phone   Notes  °[facility]  (336) 297-2271 Patients need to be referred by their primary care doctor.  ° °Medication  Assistance: °Organization         Address  Phone   Notes  °[place] [facility] [address] °[place], [place] [address] (336) 641-8030 --Must be a resident of [place] °-- Must have NO insurance coverage whatsoever (no Medicaid/ Medicare, etc.) °-- The pt. MUST have a primary care doctor that directs their care regularly and follows them in the community °  °[facility]  (866) 331-1348   °[employer]  (888) 892-1162   ° °Agencies that provide inexpensive medical care: °Organization         Address  Phone   Notes  °[facility]  (336) 832-8035   °[facility]    (336) 832-7272   °[facility] [address] °[place], [place] [address] (336) 832-4777   °[facility] [address], °[place] (336) 271-4999   °[facility]    (336) 373-0678   °[facility]    (336) 272-1050   °[facility] ° [address], [place] Phone:  (336) 832-4444, Fax:  (336) 832-4440 Hours of Operation:  9 am - 6 pm, M-F.  Also accepts Medicaid/Medicare and self-pay.  °[facility] ° [address], [place] Phone: (336) 832-3150, Fax: (336) [phone]. Hours of Operation:  8:30 am - 5:30 pm, M-F.  Also accepts Medicaid and self-pay.  °[facility] [place] [address], [place] Phone: (336) 878-6027   °[facility] [address], [place], [place] (336)723-1848, Ext. 123 Mondays & Thursdays: 7-9 AM.  First 15 patients are seen on a first come, first serve basis. °  ° °Medicaid-accepting [place] Providers: ° °Organization         Address  Phone   Notes  °[facility] [address], [place] (336) 641-2100 Also accepts self-pay patients.  °[facility] [address], [place] ° (336) 856-9996   °[facility] [address], [place]  (336) 288-8857   °[facility] [address] [place] [address], [place] (336) 299-7000   °Veita Bland [address], [place]  ° (336) 373-1557 Only accepts [place] Access Medicaid patients after they have their name applied to their card.  ° °Self-Pay (no insurance) in [place]: ° °Organization         Address  Phone   Notes  °Sickle Cell Patients, [facility] [address], [place] (336) 832-1970   °[facility] [address], [place] (336) 832-4400   °[facility] ° [address], [place] (336) 992-4800   °[facility]/Dr. Osei-Bonsu ° [address] [place] [address], [place] or [address], [place] (336) 841-8500 Phone number for both [place] and [place] locations is the same.  °[facility] [address], [place] (336) 299-0000   °[facility] [address] [place] [address], [place] or [address] (336) 852-7530 °(336) 878-2260   °[facility] [address], [place] (336) 350-1642, phone; (336) 294-5005, fax Sees patients 1st and 3rd Saturday of every month.  Must not qualify for public or private insurance (i.e. Medicaid, Medicare, [place] Health Choice, Veterans' Benefits) • Household income should be no more than 200% of the poverty level •The clinic cannot treat you if you are pregnant or think you are pregnant • Sexually transmitted diseases are not treated at the clinic.  ° ° °Dental Care: °Organization         Address  Phone  Notes  °[place] [facility] [address], [place] (336) 641-6152 Accepts children up to age 21 who are enrolled in Medicaid or [place] Health Choice; pregnant women with a Medicaid card; and children who have applied for Medicaid or [place] Health Choice, but were declined, whose parents can pay a reduced fee at time of service.  °[place]  [facility] [place]  [address], [place] (336) 641-7733 Accepts children up to age 21 who are enrolled in Medicaid or [place] Health Choice; pregnant women with a Medicaid card; and children who have applied for Medicaid or [place] Health Choice, but were declined, whose parents can pay a reduced fee at time of service.  °[facility] ° [address], [place] (336) 641-4533 Patients are seen by appointment only. Walk-ins are not accepted. [facility] will see patients 18 years of age and older. °Monday - Tuesday (8am-5pm) °Most Wednesdays (8:30-5pm) °$30 per visit, cash only  °[facility] ° [address], [place] (336) 641-4533 Patients are seen by appointment only. Walk-ins are not accepted. [facility] will see patients 18 years of age and older. °One   Wednesday Evening (Monthly: Volunteer Based).  $30 per visit, cash only  °[facility]  (919) 537-3737 for adults; Children under age 4, call Graduate Pediatric Dentistry at ([phone]) [phone]. Children aged 4-14, please call ([phone]) [phone] to request a pediatric application. ° Dental services are provided in all areas of dental care including fillings, crowns and bridges, complete and partial dentures, implants, gum treatment, root canals, and extractions. Preventive care is also provided. Treatment is provided to both adults and children. °Patients are selected via a lottery and there is often a waiting list. °  °[facility] [address], °[place] ° (336) 763-8833 [email or web address] °  °[facility] [address], [place], [place] (336)723-1848, Ext. 123 Second and Fourth Thursday of each month, opens at 6:30 AM; Clinic ends at 9 AM.  Patients are seen on a first-come first-served basis, and a limited number are seen during each clinic.  ° °[facility] ° [address], [place], [place] (336) 723-7904    Eligibility Requirements °You must have lived in [place], [place], or [place] counties for at least the last three months. °  You cannot be eligible for state or federal sponsored healthcare insurance, including Veterans Administration, Medicaid, or Medicare. °  You generally cannot be eligible for healthcare insurance through your employer.  °  How to apply: °Eligibility screenings are held every Tuesday and Wednesday afternoon from 1:00 pm until 4:00 pm. You do not need an appointment for the interview!  °[facility] [address], [place], [place] 336-631-2330   °[facility]  336-342-8273   °[facility]  336-703-3100   °[facility]  336-570-6415   ° °Behavioral Health Resources in the Community: °Intensive Outpatient Programs °Organization         Address  Phone  Notes  °[facility] [address], [place], [place] 336-878-6098   °[facility] [address], [place], [place] 336-832-9800   °[facility] [address], [place], [place] ° 336-882-2125   °[facility] [address],  °[place], [place] 1-800-853-5163 or 336-641-4981   °Substance Abuse Resources °Organization         Address  Phone  Notes  °[facility]  336-882-2125   °[facility]  336-784-9470   °[facility]  336-285-9073   °[facility]  336-845-3988   °Residential & Outpatient Substance Abuse Program  1-800-659-3381   °Psychological Services °Organization         Address  Phone  Notes  °[facility]  336- 832-9600   °[facility]  336- 378-7881   °[facility] [address], [place] 1-800-853-5163 or 336-641-4981   ° °Mobile Crisis Teams °Organization         Address  Phone  Notes  °[facility], Mobile Crisis Care Unit  1-877-626-1772   °Assertive °[facility] ° [address]  [place], [place] 336-834-9664   °Sharon DeEsch [address] °[place] 336-554-5454   ° °Self-Help/Support Groups °Organization         Address  Phone             Notes  °[facility]  - variety of support groups  336- 373-1402 Call for more information  °Narcotics Anonymous (NA), [facility] [address], °[place]  2 meetings at this location  ° °  Residential Treatment Programs Organization         Address  Phone  Notes  [facility] [address],    [place]  5-409-811-9147   [facility]  [address] [place] [address], [place], [place] 130-865-7846   [facility] [address] [place], [place] [phone] Admissions: 8am-3pm M-F  [facility] [address]    [place], [place] 244-010-2725   [facility] [address] [place], [place], [place] 366-440-3474   [facility] [address],  [place], [place] 259-563-8756   [facility] Intensive Outpatient [address], [place], [place] 433-295-1884   [facility] ([facility] [address] [place]  [place], [place] 1-660-630-1601 or 603-479-2934   [facility] ([facility] [address], [place], [place] 202-542-7062 Accepts Medicaid  [facility] [address]  [place] 3-762-831-5176 Substance Abuse/Addiction Treatment   [facility]         Address  Phone  Notes  [facility]  (343)568-8070   Angie Fava, PhD [address] [place], [place]   5757759540 or 660-420-3776   [facility]   [address] [place], [place] 434-373-9931   [facility] [address] [place], [place] 440-405-3435 Insurance/Medicaid/sponsorship through [facility] [address]                                    [place], [place] ([phone] Therapy/tele-psych/case    [facility] [address], [place] ([phone])[phone]    Dr. Lolly Mustache  501-391-4958   [facility]  [employer] [facility] Dept. 1) [address], [place] 2) [address] [place] 3)  [address], [place] [phone]  956-055-9463   [facility] Child Abuse Hotline 3171858371 or (573)278-7469 (After Hours)      Take over the counter tylenol, as directed on packaging, as needed for discomfort.  Apply moist heat or ice to the area(s) of discomfort, for 15 minutes at a time, several times per day for the next few days.  Do not fall asleep on a heating or ice pack. Wear the splint and sling until you are seen in follow up.  Call your regular medical doctor on Monday to schedule a follow up appointment in the next 2 days. Call the Orthopedic doctor tomorrow to schedule a follow up appointment within the week.  Return to the Emergency Department immediately if worsening.

## 2014-11-02 NOTE — ED Notes (Signed)
Lab at bedside

## 2014-11-02 NOTE — ED Notes (Addendum)
Per ems: Pt from [facility]  bending down to pick something up from wheelchair and fell out and injured right forehead.  Pt on blood thinner (uknownn).  Pt c/o right shoulder pain and right wrist.  Pt has large hematoma to right forehead.  Pt alert and oriented. Pt alert and oriented.

## 2014-11-02 NOTE — ED Provider Notes (Signed)
CSN: [id number]     Arrival date & time 11/02/14  1602 History   First MD Initiated Contact with Patient 11/02/14 1612     Chief Complaint  Patient presents with  . Fall      HPI  Pt was seen at 1625. Per EMS, NH report and pt: c/o sudden onset and resolution of one episode of slip and fall out of her wheelchair PTA. Pt was leaning forward from her wheelchair to pick something up and fell to the floor. Pt c/o right forehead hematoma, right shoulder pain, right wrist pain. Denies syncope/LOC, no neck or back pain, no CP/SOB, no abd pain, no N/V/D, no focal motor weakness, no tingling/numbness in extremities.    Past Medical History  Diagnosis Date  . Hyperlipidemia   . Osteoporosis   . Hypertension     with severe left ventricular hypertrophy; normal ejection fraction in 04/2005  . Valvular heart disease     Mild to moderate aortic stenosis; moderate to severe mitral stenosis in 2007; mild MR  . Paroxysmal atrial fibrillation     Remote  . Diverticulosis     Pancolonic; h/o LGI bleeding and diverticulitis  . Degenerative joint disease     Knees  . Chronic kidney disease     Mild; creatinine of 1.19-1.28 in recent years  . Anemia, iron deficiency   . Popliteal cyst     Left  . Fracture of wrist 2008    left  . Gout   . Stroke right side deficit  . NSTEMI, initial episode of care 01/05/2012  . Chronic combined systolic and diastolic congestive heart failure 02/2013    EF 30%; Grade 2 diastolic dys   Past Surgical History  Procedure Laterality Date  . Appendectomy  1944  . Total hip arthroplasty  1994   Family History  Problem Relation Age of Onset  . Diabetes Mother    History  Substance Use Topics  . Smoking status: Former Smoker    Types: Cigarettes    Quit date: 08/31/1972  . Smokeless tobacco: Never Used  . Alcohol Use: No    Review of Systems ROS: Statement: All systems negative except as marked or noted in the HPI; Constitutional: Negative for fever and  chills. ; ; Eyes: Negative for eye pain, redness and discharge. ; ; ENMT: Negative for ear pain, hoarseness, nasal congestion, sinus pressure and sore throat. ; ; Cardiovascular: Negative for chest pain, palpitations, diaphoresis, dyspnea and peripheral edema. ; ; Respiratory: Negative for cough, wheezing and stridor. ; ; Gastrointestinal: Negative for nausea, vomiting, diarrhea, abdominal pain, blood in stool, hematemesis, jaundice and rectal bleeding. . ; ; Genitourinary: Negative for dysuria, flank pain and hematuria. ; ; Musculoskeletal: +head injury, right arm pain, right wrist pain. Negative for back pain and neck pain. Negative for swelling and deformity..; ; Skin: +hematoma right forehead. Negative for pruritus, rash, abrasions, blisters, and skin lesion.; ; Neuro: Negative for headache, lightheadedness and neck stiffness. Negative for weakness, altered level of consciousness , altered mental status, extremity weakness, paresthesias, involuntary movement, seizure and syncope.      Allergies  Review of patient's allergies indicates no known allergies.  Home Medications   Prior to Admission medications   Medication Sig Start Date End Date Taking? Authorizing Provider  acetaminophen (TYLENOL) 500 MG tablet Take 1 tablet (500 mg total) by mouth every 8 (eight) hours. 02/12/14   Gwenyth Bender, NP  alendronate (FOSAMAX) 70 MG tablet TAKE 1 TAB EACH WEEK 30  MIN PRIOR TO BREAKFAST WITH LARGE GLASS OF WATER. REMAIN UPRIGHT. Patient taking differently: TAKE 1 TAB EACH WEEK 30 MIN PRIOR TO BREAKFAST WITH LARGE GLASS OF WATER. REMAIN UPRIGHT. Takes on Friday 12/06/13   Kerri Perches, MD  allopurinol (ZYLOPRIM) 100 MG tablet TAKE ONE TABLET BY MOUTH ONCE DAILY. 10/28/13   Kerri Perches, MD  Amino Acids-Protein Hydrolys (FEEDING SUPPLEMENT, PRO-STAT SUGAR FREE 64,) LIQD Take 30 mLs by mouth 3 (three) times daily with meals.    Historical Provider, MD  ASPIRIN LOW DOSE 81 MG EC tablet TAKE ONE TABLET  BY MOUTH ONCE DAILY. 10/28/13   Kerri Perches, MD  atorvastatin (LIPITOR) 10 MG tablet Take 10 mg by mouth daily.    Historical Provider, MD  benzonatate (TESSALON) 100 MG capsule Take 1 capsule (100 mg total) by mouth 3 (three) times daily as needed for cough. 02/05/14   Gwenyth Bender, NP  Calcium Carb-Cholecalciferol 500-400 MG-UNIT TABS TAKE (1) TABLET BY MOUTH (3) TIMES DAILY. 07/24/13   Kerri Perches, MD  cefUROXime (CEFTIN) 250 MG tablet Take 1 tablet (250 mg total) by mouth 2 (two) times daily with a meal. 02/12/14   Gwenyth Bender, NP  COLACE 50 MG capsule TAKE (1) CAPSULE BY MOUTH TWICE DAILY. 10/28/13   Kerri Perches, MD  diclofenac sodium (VOLTAREN) 1 % GEL Apply 2 g topically 3 (three) times daily. 02/12/14   Gwenyth Bender, NP  feeding supplement, ENSURE COMPLETE, (ENSURE COMPLETE) LIQD Take 237 mLs by mouth 2 (two) times daily between meals. 02/05/14   Gwenyth Bender, NP  fluticasone (FLONASE) 50 MCG/ACT nasal spray USE 1 SPRAY IN EACH NOSTRIL ONCE DAILY. 07/23/13   Kerri Perches, MD  furosemide (LASIX) 40 MG tablet TAKE ONE TABLET BY MOUTH ONCE DAILY. 12/24/13   Laqueta Linden, MD  HYDROcodone-acetaminophen (NORCO/VICODIN) 5-325 MG per tablet Take 1 tablet by mouth every 6 (six) hours as needed (pain). 02/12/14   Gwenyth Bender, NP  losartan (COZAAR) 50 MG tablet Take 50 mg by mouth daily.    Historical Provider, MD  menthol-cetylpyridinium (CEPACOL) 3 MG lozenge Take 1 lozenge (3 mg total) by mouth as needed for sore throat. 02/05/14   Gwenyth Bender, NP  metoprolol (LOPRESSOR) 50 MG tablet Take 50-75 mg by mouth 2 (two) times daily. 75 mg each morning and 50 mg at bedtime.    Historical Provider, MD  pantoprazole (PROTONIX) 20 MG tablet Take 20 mg by mouth 2 (two) times daily.    Historical Provider, MD  potassium chloride (K-DUR) 10 MEQ tablet TAKE 1 TABLET BY MOUTH ON MONDAY,WEDNESDAY AND FRIDAY. 10/28/13   Kerri Perches, MD  senna-docusate (SENOKOT-S) 8.6-50 MG per  tablet Take 1 tablet by mouth 2 (two) times daily.    Historical Provider, MD  simvastatin (ZOCOR) 20 MG tablet TAKE (1) TABLET BY MOUTH AT BEDTIME FOR CHOLESTEROL. 10/28/13   Kerri Perches, MD  traMADol (ULTRAM) 50 MG tablet Take 50 mg by mouth every 6 (six) hours.    Historical Provider, MD  valsartan (DIOVAN) 80 MG tablet Take 1 tablet by mouth daily. 01/28/14   Historical Provider, MD  vitamin C (ASCORBIC ACID) 500 MG tablet Take 500 mg by mouth daily.    Historical Provider, MD  warfarin (COUMADIN) 1 MG tablet TAKE 1 TABLET BY MOUTH ONCE A DAY,ALONG WITH 2.5 MG DAILY TO EQUAL 3.5 MG. Patient not taking: Reported on 02/11/2014 10/28/13   Jodelle Gross, NP  warfarin (COUMADIN) 2.5 MG tablet TAKE 1 TABLET BY MOUTH ONCE A DAY,ALONG WITH 1 MG TO EQUAL 3.5 MG DAILY. Patient not taking: Reported on 02/11/2014 12/23/13   Laqueta Linden, MD  warfarin (COUMADIN) 2.5 MG tablet Take 2.5 mg by mouth daily.    Historical Provider, MD  Wheat Dextrin (BENEFIBER PO) Take 1 each by mouth daily.    Historical Provider, MD   BP 136/101 mmHg  Pulse 89  Temp(Src) 98.2 F (36.8 C) (Oral)  Resp 16  Ht 5\' 8"  (1.727 m)  Wt 136 lb (61.689 kg)  BMI 20.68 kg/m2  SpO2 100% Physical Exam  1630: Physical examination: Vital signs and O2 SAT: Reviewed; Constitutional: Well developed, Well nourished, Well hydrated, In no acute distress; Head and Face: Normocephalic, +right forehead hematoma. No open wounds.;; Eyes: EOMI, PERRL, No scleral icterus; ENMT: Mouth and pharynx normal, Left TM normal, Right TM normal, Mucous membranes moist; Neck: Supple, Trachea midline; Spine: No midline CS, TS, LS tenderness.; Cardiovascular: Regular rate and rhythm, No gallop; Respiratory: Breath sounds clear & equal bilaterally, No rales, rhonchi, wheezes, Normal respiratory effort/excursion; Chest: Nontender, No deformity, Movement normal, No crepitus, No abrasions or ecchymosis.; Abdomen: Soft, Nontender, Nondistended, Normal  bowel sounds, No abrasions or ecchymosis.; Genitourinary: No CVA tenderness;; Extremities: +mild chronic bilat hand and wrist joints deformity, Pt will slowly perform full range of motion major/large joints of bilat UE's and LE's without pain or tenderness to palp, Neurovascularly intact, Pulses normal, No edema, Pelvis stable; Neuro: AA&Ox3, Major CN grossly intact. Speech clear. No gross focal motor or sensory deficits in extremities.; Skin: Color normal, Warm, Dry. +shallow chronic ulcerations bilat great toes.    ED Course  Procedures      EKG Interpretation None      MDM  MDM Reviewed: previous chart, nursing note and vitals Reviewed previous: labs Interpretation: x-ray, CT scan and labs     Results for orders placed or performed during the hospital encounter of 11/02/14  Protime-INR  Result Value Ref Range   Prothrombin Time 29.1 (H) 11.6 - 15.2 seconds   INR 2.80 (H) 0.00 - 1.49   Dg Chest 1 View 10/25/2014   CLINICAL DATA:  Syncope.  EXAM: CHEST  1 VIEW  COMPARISON:  02/11/2014  FINDINGS: Moderate cardiomegaly and ectasia of the thoracic aorta are stable. Both lungs are clear. No evidence of pneumothorax or pleural effusion.  IMPRESSION: Stable cardiomegaly.  No active lung disease.   Electronically Signed   By: Myles Rosenthal M.D.   On: 10/25/2014 15:55   Dg Shoulder Right 11/02/2014   CLINICAL DATA:  Larey Seat today.  Shoulder, elbow and wrist pain.  EXAM: RIGHT SHOULDER - 2+ VIEW; RIGHT ELBOW - COMPLETE 3+ VIEW; RIGHT WRIST - COMPLETE 3+ VIEW  COMPARISON:  None.  FINDINGS: Right shoulder:  Severe chronic degenerative changes. Probable remote AVN and probable remote fracture. Probable rotator cuff disease with narrowing of the humeroacromial space. The [facility] joint is intact. The visualize right ribs are intact.  Right elbow:  Limited examination due to positioning. Suspect a radial neck fracture. Probable small joint effusion.  Right wrist:  Severe degenerative changes with carpal  crowding and probable erosions. Findings could be due to rheumatoid arthritis. No acute wrist fracture is identified. There are extensive vascular changes.  IMPRESSION: 1. Severe chronic changes involving the right shoulder possibly due to rheumatoid arthritis. Suspect remote AVN. No definite acute shoulder fracture. 2. Suspect radial neck fracture at the elbow. 3. Advanced degenerative changes  involving the wrist possibly due to rheumatoid arthritis. No definite acute wrist fracture.   Electronically Signed   By: Rudie Meyer M.D.   On: 11/02/2014 17:26   Dg Elbow Complete Right 11/02/2014   CLINICAL DATA:  Larey Seat today.  Shoulder, elbow and wrist pain.  EXAM: RIGHT SHOULDER - 2+ VIEW; RIGHT ELBOW - COMPLETE 3+ VIEW; RIGHT WRIST - COMPLETE 3+ VIEW  COMPARISON:  None.  FINDINGS: Right shoulder:  Severe chronic degenerative changes. Probable remote AVN and probable remote fracture. Probable rotator cuff disease with narrowing of the humeroacromial space. The [facility] joint is intact. The visualize right ribs are intact.  Right elbow:  Limited examination due to positioning. Suspect a radial neck fracture. Probable small joint effusion.  Right wrist:  Severe degenerative changes with carpal crowding and probable erosions. Findings could be due to rheumatoid arthritis. No acute wrist fracture is identified. There are extensive vascular changes.  IMPRESSION: 1. Severe chronic changes involving the right shoulder possibly due to rheumatoid arthritis. Suspect remote AVN. No definite acute shoulder fracture. 2. Suspect radial neck fracture at the elbow. 3. Advanced degenerative changes involving the wrist possibly due to rheumatoid arthritis. No definite acute wrist fracture.   Electronically Signed   By: Rudie Meyer M.D.   On: 11/02/2014 17:26   Dg Wrist Complete Right 11/02/2014   CLINICAL DATA:  Larey Seat today.  Shoulder, elbow and wrist pain.  EXAM: RIGHT SHOULDER - 2+ VIEW; RIGHT ELBOW - COMPLETE 3+ VIEW; RIGHT WRIST -  COMPLETE 3+ VIEW  COMPARISON:  None.  FINDINGS: Right shoulder:  Severe chronic degenerative changes. Probable remote AVN and probable remote fracture. Probable rotator cuff disease with narrowing of the humeroacromial space. The [facility] joint is intact. The visualize right ribs are intact.  Right elbow:  Limited examination due to positioning. Suspect a radial neck fracture. Probable small joint effusion.  Right wrist:  Severe degenerative changes with carpal crowding and probable erosions. Findings could be due to rheumatoid arthritis. No acute wrist fracture is identified. There are extensive vascular changes.  IMPRESSION: 1. Severe chronic changes involving the right shoulder possibly due to rheumatoid arthritis. Suspect remote AVN. No definite acute shoulder fracture. 2. Suspect radial neck fracture at the elbow. 3. Advanced degenerative changes involving the wrist possibly due to rheumatoid arthritis. No definite acute wrist fracture.   Electronically Signed   By: Rudie Meyer M.D.   On: 11/02/2014 17:26   Ct Head Wo Contrast 11/02/2014   CLINICAL DATA:  Fall, right forehead injury, large hematoma, on blood thinner  EXAM: CT HEAD WITHOUT CONTRAST  CT CERVICAL SPINE WITHOUT CONTRAST  TECHNIQUE: Multidetector CT imaging of the head and cervical spine was performed following the standard protocol without intravenous contrast. Multiplanar CT image reconstructions of the cervical spine were also generated.  COMPARISON:  02/11/2014  FINDINGS: CT HEAD FINDINGS  No evidence of parenchymal hemorrhage or extra-axial fluid collection. No mass lesion, mass effect, or midline shift.  No CT evidence of acute infarction.  Subcortical white matter and periventricular small vessel ischemic changes. Intracranial atherosclerosis.  Global cortical and central atrophy. Secondary ventricular prominence.  The visualized paranasal sinuses are essentially clear. The mastoid air cells are unopacified.  Moderate extracranial hematoma  overlying the right frontal bone (series 2/image 17).  No evidence of calvarial fracture.  CT CERVICAL SPINE FINDINGS  Exaggerated upper cervical lordosis.  No evidence of fracture or dislocation. View body heights are maintained. Dens appears intact.  No prevertebral soft tissue swelling.  Mild to moderate multilevel degenerative changes.  Visualized thyroid is grossly unremarkable.  Visualized lung apices are clear.  IMPRESSION: Moderate extracranial hematoma overlying the right frontal bone. No evidence of calvarial fracture. No evidence of acute intracranial abnormality.  No evidence of traumatic injury to the cervical spine. Mild to moderate degenerative changes.   Electronically Signed   By: Charline Bills M.D.   On: 11/02/2014 17:12   Ct Cervical Spine Wo Contrast 11/02/2014   CLINICAL DATA:  Fall, right forehead injury, large hematoma, on blood thinner  EXAM: CT HEAD WITHOUT CONTRAST  CT CERVICAL SPINE WITHOUT CONTRAST  TECHNIQUE: Multidetector CT imaging of the head and cervical spine was performed following the standard protocol without intravenous contrast. Multiplanar CT image reconstructions of the cervical spine were also generated.  COMPARISON:  02/11/2014  FINDINGS: CT HEAD FINDINGS  No evidence of parenchymal hemorrhage or extra-axial fluid collection. No mass lesion, mass effect, or midline shift.  No CT evidence of acute infarction.  Subcortical white matter and periventricular small vessel ischemic changes. Intracranial atherosclerosis.  Global cortical and central atrophy. Secondary ventricular prominence.  The visualized paranasal sinuses are essentially clear. The mastoid air cells are unopacified.  Moderate extracranial hematoma overlying the right frontal bone (series 2/image 17).  No evidence of calvarial fracture.  CT CERVICAL SPINE FINDINGS  Exaggerated upper cervical lordosis.  No evidence of fracture or dislocation. View body heights are maintained. Dens appears intact.  No  prevertebral soft tissue swelling.  Mild to moderate multilevel degenerative changes.  Visualized thyroid is grossly unremarkable.  Visualized lung apices are clear.  IMPRESSION: Moderate extracranial hematoma overlying the right frontal bone. No evidence of calvarial fracture. No evidence of acute intracranial abnormality.  No evidence of traumatic injury to the cervical spine. Mild to moderate degenerative changes.   Electronically Signed   By: Charline Bills M.D.   On: 11/02/2014 17:12    1815:  Long arm posterior splint/sling applied. Will need Ortho MD f/u. Dx and testing d/w pt.  Questions answered.  Verb understanding, agreeable to d/c home with outpt f/u.    Samuel Jester, DO 11/05/14 [phone]

## 2014-11-02 NOTE — ED Notes (Signed)
[facility] made aware that pt is up for discharge. Report given to Robin. Zella Ball states she will return call to give route pt will go home.

## 2014-11-02 NOTE — ED Notes (Signed)
Md at bedside

## 2014-11-03 DIAGNOSIS — S4982XA Other specified injuries of left shoulder and upper arm, initial encounter: Secondary | ICD-10-CM | POA: Diagnosis not present

## 2014-11-03 DIAGNOSIS — S0990XA Unspecified injury of head, initial encounter: Secondary | ICD-10-CM | POA: Diagnosis not present

## 2014-11-03 DIAGNOSIS — I5040 Unspecified combined systolic (congestive) and diastolic (congestive) heart failure: Secondary | ICD-10-CM | POA: Diagnosis not present

## 2014-11-03 DIAGNOSIS — Z7901 Long term (current) use of anticoagulants: Secondary | ICD-10-CM | POA: Diagnosis not present

## 2014-11-03 DIAGNOSIS — M6281 Muscle weakness (generalized): Secondary | ICD-10-CM | POA: Diagnosis not present

## 2014-11-03 DIAGNOSIS — I480 Paroxysmal atrial fibrillation: Secondary | ICD-10-CM | POA: Diagnosis not present

## 2014-11-03 DIAGNOSIS — I959 Hypotension, unspecified: Secondary | ICD-10-CM | POA: Diagnosis not present

## 2014-11-04 ENCOUNTER — Ambulatory Visit (INDEPENDENT_AMBULATORY_CARE_PROVIDER_SITE_OTHER): Payer: Medicare Other | Admitting: Orthopedic Surgery

## 2014-11-04 ENCOUNTER — Encounter: Payer: Self-pay | Admitting: Orthopedic Surgery

## 2014-11-04 VITALS — BP 133/82 | Ht 66.0 in | Wt 136.0 lb

## 2014-11-04 DIAGNOSIS — S0990XA Unspecified injury of head, initial encounter: Secondary | ICD-10-CM | POA: Diagnosis not present

## 2014-11-04 DIAGNOSIS — S52121A Displaced fracture of head of right radius, initial encounter for closed fracture: Secondary | ICD-10-CM | POA: Diagnosis not present

## 2014-11-04 DIAGNOSIS — D649 Anemia, unspecified: Secondary | ICD-10-CM | POA: Diagnosis not present

## 2014-11-04 DIAGNOSIS — Z79899 Other long term (current) drug therapy: Secondary | ICD-10-CM | POA: Diagnosis not present

## 2014-11-04 DIAGNOSIS — S4982XA Other specified injuries of left shoulder and upper arm, initial encounter: Secondary | ICD-10-CM | POA: Diagnosis not present

## 2014-11-04 DIAGNOSIS — M6281 Muscle weakness (generalized): Secondary | ICD-10-CM | POA: Diagnosis not present

## 2014-11-04 DIAGNOSIS — S63501A Unspecified sprain of right wrist, initial encounter: Secondary | ICD-10-CM | POA: Diagnosis not present

## 2014-11-04 DIAGNOSIS — I959 Hypotension, unspecified: Secondary | ICD-10-CM | POA: Diagnosis not present

## 2014-11-04 DIAGNOSIS — I5022 Chronic systolic (congestive) heart failure: Secondary | ICD-10-CM | POA: Diagnosis not present

## 2014-11-04 DIAGNOSIS — I5040 Unspecified combined systolic (congestive) and diastolic (congestive) heart failure: Secondary | ICD-10-CM | POA: Diagnosis not present

## 2014-11-04 NOTE — Progress Notes (Signed)
Patient ID: Judy Grant, female   DOB: 01/31/1918, 81 y.o.   MRN: [id number]   New   Chief Complaint  Patient presents with  . Elbow Pain    ER follow up on right elbow, DOI 11/02/14.    HPI Judy Grant is a 81 y.o. female.  With history of rheumatoid arthritis fell out of her wheelchair on the seventh which was 2 days ago she injured her right shoulder elbow and wrist presents after radiographs shows radial head fracture the right elbow, osteoarthritis/rheumatoid arthritis shoulder and wrist complaining of elbow and wrist pain  The patient has minimal ability to interact with the examination but does respond to pain  Review of systems limited by patient's verbal communication skills  Review of Systems Review of Systems  Past Medical History  Diagnosis Date  . Hyperlipidemia   . Osteoporosis   . Hypertension     with severe left ventricular hypertrophy; normal ejection fraction in 04/2005  . Valvular heart disease     Mild to moderate aortic stenosis; moderate to severe mitral stenosis in 2007; mild MR  . Paroxysmal atrial fibrillation     Remote  . Diverticulosis     Pancolonic; h/o LGI bleeding and diverticulitis  . Degenerative joint disease     Knees  . Chronic kidney disease     Mild; creatinine of 1.19-1.28 in recent years  . Anemia, iron deficiency   . Popliteal cyst     Left  . Fracture of wrist 2008    left  . Gout   . Stroke right side deficit  . NSTEMI, initial episode of care 01/05/2012  . Chronic combined systolic and diastolic congestive heart failure 02/2013    EF 30%; Grade 2 diastolic dys    Past Surgical History  Procedure Laterality Date  . Appendectomy  1944  . Total hip arthroplasty  1994    Family History  Problem Relation Age of Onset  . Diabetes Mother     Social History History  Substance Use Topics  . Smoking status: Former Smoker    Types: Cigarettes    Quit date: 08/31/1972  . Smokeless tobacco: Never Used  .  Alcohol Use: No    No Known Allergies  Current Outpatient Prescriptions  Medication Sig Dispense Refill  . acetaminophen (TYLENOL) 500 MG tablet Take 1 tablet (500 mg total) by mouth every 8 (eight) hours. 30 tablet 0  . alendronate (FOSAMAX) 70 MG tablet TAKE 1 TAB EACH WEEK 30 MIN PRIOR TO BREAKFAST WITH LARGE GLASS OF WATER. REMAIN UPRIGHT. (Patient taking differently: TAKE 1 TAB EACH WEEK 30 MIN PRIOR TO BREAKFAST WITH LARGE GLASS OF WATER. REMAIN UPRIGHT. Takes on Friday) 4 tablet 3  . allopurinol (ZYLOPRIM) 100 MG tablet TAKE ONE TABLET BY MOUTH ONCE DAILY. 30 tablet 3  . Amino Acids-Protein Hydrolys (FEEDING SUPPLEMENT, PRO-STAT SUGAR FREE 64,) LIQD Take 30 mLs by mouth 3 (three) times daily with meals.    . ASPIRIN LOW DOSE 81 MG EC tablet TAKE ONE TABLET BY MOUTH ONCE DAILY. 30 tablet 3  . atorvastatin (LIPITOR) 10 MG tablet Take 10 mg by mouth daily.    . benzonatate (TESSALON) 100 MG capsule Take 1 capsule (100 mg total) by mouth 3 (three) times daily as needed for cough. 20 capsule 0  . Calcium Carb-Cholecalciferol 500-400 MG-UNIT TABS TAKE (1) TABLET BY MOUTH (3) TIMES DAILY. 90 tablet 6  . cefUROXime (CEFTIN) 250 MG tablet Take 1 tablet (250 mg total) by  mouth 2 (two) times daily with a meal. 8 tablet 0  . COLACE 50 MG capsule TAKE (1) CAPSULE BY MOUTH TWICE DAILY. 60 capsule 3  . diclofenac sodium (VOLTAREN) 1 % GEL Apply 2 g topically 3 (three) times daily. 1 Tube 0  . feeding supplement, ENSURE COMPLETE, (ENSURE COMPLETE) LIQD Take 237 mLs by mouth 2 (two) times daily between meals.    . fluticasone (FLONASE) 50 MCG/ACT nasal spray USE 1 SPRAY IN EACH NOSTRIL ONCE DAILY. 16 g 2  . furosemide (LASIX) 40 MG tablet TAKE ONE TABLET BY MOUTH ONCE DAILY. 30 tablet 0  . HYDROcodone-acetaminophen (NORCO/VICODIN) 5-325 MG per tablet Take 1 tablet by mouth every 6 (six) hours as needed (pain). 30 tablet 0  . losartan (COZAAR) 50 MG tablet Take 50 mg by mouth daily.    Marland Kitchen  menthol-cetylpyridinium (CEPACOL) 3 MG lozenge Take 1 lozenge (3 mg total) by mouth as needed for sore throat. 100 tablet 12  . metoprolol (LOPRESSOR) 50 MG tablet Take 50-75 mg by mouth 2 (two) times daily. 75 mg each morning and 50 mg at bedtime.    . pantoprazole (PROTONIX) 20 MG tablet Take 20 mg by mouth 2 (two) times daily.    . potassium chloride (K-DUR) 10 MEQ tablet TAKE 1 TABLET BY MOUTH ON MONDAY,WEDNESDAY AND FRIDAY. 12 tablet 3  . senna-docusate (SENOKOT-S) 8.6-50 MG per tablet Take 1 tablet by mouth 2 (two) times daily.    . simvastatin (ZOCOR) 20 MG tablet TAKE (1) TABLET BY MOUTH AT BEDTIME FOR CHOLESTEROL. 30 tablet 3  . traMADol (ULTRAM) 50 MG tablet Take 50 mg by mouth every 6 (six) hours.    . valsartan (DIOVAN) 80 MG tablet Take 1 tablet by mouth daily.    . vitamin C (ASCORBIC ACID) 500 MG tablet Take 500 mg by mouth daily.    Marland Kitchen warfarin (COUMADIN) 1 MG tablet TAKE 1 TABLET BY MOUTH ONCE A DAY,ALONG WITH 2.5 MG DAILY TO EQUAL 3.5 MG. (Patient not taking: Reported on 02/11/2014) 30 tablet 3  . warfarin (COUMADIN) 2.5 MG tablet TAKE 1 TABLET BY MOUTH ONCE A DAY,ALONG WITH 1 MG TO EQUAL 3.5 MG DAILY. (Patient not taking: Reported on 02/11/2014) 30 tablet 3  . warfarin (COUMADIN) 2.5 MG tablet Take 2.5 mg by mouth daily.    . Wheat Dextrin (BENEFIBER PO) Take 1 each by mouth daily.     No current facility-administered medications for this visit.       Physical Exam Physical Exam Blood pressure 133/82, height 5\' 6"  (1.676 m), weight 136 lb (61.689 kg). Our findings today include a very frail elderly female with multiple joint deformities of the hand and wrist bilaterally. She appears to be oriented to person. Her mood and affect are flat her verbal communication skills are minimal she is ambulatory only in the wheelchair  The right shoulder has crepitance on range of motion without deformity  There is tenderness over the radial head with painful range of motion and  crepitance of the right elbow without instability  Neurovascular exam remains intact strong radial pulses noted her skin shows ecchymosis of the hand and around the elbow none around the shoulder. The wrist is tender at the wrist joint with painful range of motion there is deformity of the thumb and the small joints of the hand consistent with a probable rheumatoid arthritis. Wrist is stable.  Lymph nodes are negative Data Reviewed 3 sets of images right shoulder shows avascular necrosis and osteoarthritis  of the right shoulder osteopenia  Right elbow shows osteoarthritis or rheumatoid arthritis with osteopenia and radial neck fracture  Right wrist shows degenerative changes which probably are rheumatoid related without major deformity  Assessment    Encounter Diagnoses  Name Primary?  . Radial head fracture, closed, right, initial encounter Yes  . Wrist sprain, right, initial encounter         Plan    Sling 2 weeks right elbow  Removable wrist splint 4 weeks right wrist   The patient can follow-up if she still having difficulties but both of these are self-limiting problems       Fuller Canada 11/04/2014, 9:31 AM

## 2014-11-05 DIAGNOSIS — M6281 Muscle weakness (generalized): Secondary | ICD-10-CM | POA: Diagnosis not present

## 2014-11-05 DIAGNOSIS — L97519 Non-pressure chronic ulcer of other part of right foot with unspecified severity: Secondary | ICD-10-CM | POA: Diagnosis not present

## 2014-11-05 DIAGNOSIS — I5040 Unspecified combined systolic (congestive) and diastolic (congestive) heart failure: Secondary | ICD-10-CM | POA: Diagnosis not present

## 2014-11-06 DIAGNOSIS — L97519 Non-pressure chronic ulcer of other part of right foot with unspecified severity: Secondary | ICD-10-CM | POA: Diagnosis not present

## 2014-11-06 DIAGNOSIS — M6281 Muscle weakness (generalized): Secondary | ICD-10-CM | POA: Diagnosis not present

## 2014-11-06 DIAGNOSIS — N189 Chronic kidney disease, unspecified: Secondary | ICD-10-CM | POA: Diagnosis not present

## 2014-11-06 DIAGNOSIS — I959 Hypotension, unspecified: Secondary | ICD-10-CM | POA: Diagnosis not present

## 2014-11-06 DIAGNOSIS — R701 Abnormal plasma viscosity: Secondary | ICD-10-CM | POA: Diagnosis not present

## 2014-11-06 DIAGNOSIS — S0990XA Unspecified injury of head, initial encounter: Secondary | ICD-10-CM | POA: Diagnosis not present

## 2014-11-06 DIAGNOSIS — L97529 Non-pressure chronic ulcer of other part of left foot with unspecified severity: Secondary | ICD-10-CM | POA: Diagnosis not present

## 2014-11-06 DIAGNOSIS — I5040 Unspecified combined systolic (congestive) and diastolic (congestive) heart failure: Secondary | ICD-10-CM | POA: Diagnosis not present

## 2014-11-06 DIAGNOSIS — I480 Paroxysmal atrial fibrillation: Secondary | ICD-10-CM | POA: Diagnosis not present

## 2014-11-07 DIAGNOSIS — S0990XA Unspecified injury of head, initial encounter: Secondary | ICD-10-CM | POA: Diagnosis not present

## 2014-11-07 DIAGNOSIS — R701 Abnormal plasma viscosity: Secondary | ICD-10-CM | POA: Diagnosis not present

## 2014-11-07 DIAGNOSIS — I5040 Unspecified combined systolic (congestive) and diastolic (congestive) heart failure: Secondary | ICD-10-CM | POA: Diagnosis not present

## 2014-11-07 DIAGNOSIS — M6281 Muscle weakness (generalized): Secondary | ICD-10-CM | POA: Diagnosis not present

## 2014-11-07 DIAGNOSIS — I959 Hypotension, unspecified: Secondary | ICD-10-CM | POA: Diagnosis not present

## 2014-11-07 DIAGNOSIS — I480 Paroxysmal atrial fibrillation: Secondary | ICD-10-CM | POA: Diagnosis not present

## 2014-11-08 DIAGNOSIS — N189 Chronic kidney disease, unspecified: Secondary | ICD-10-CM | POA: Diagnosis not present

## 2014-11-08 DIAGNOSIS — R701 Abnormal plasma viscosity: Secondary | ICD-10-CM | POA: Diagnosis not present

## 2014-11-08 DIAGNOSIS — S0990XA Unspecified injury of head, initial encounter: Secondary | ICD-10-CM | POA: Diagnosis not present

## 2014-11-08 DIAGNOSIS — I480 Paroxysmal atrial fibrillation: Secondary | ICD-10-CM | POA: Diagnosis not present

## 2014-11-08 DIAGNOSIS — I959 Hypotension, unspecified: Secondary | ICD-10-CM | POA: Diagnosis not present

## 2014-11-10 DIAGNOSIS — D509 Iron deficiency anemia, unspecified: Secondary | ICD-10-CM | POA: Diagnosis not present

## 2014-11-10 DIAGNOSIS — I5040 Unspecified combined systolic (congestive) and diastolic (congestive) heart failure: Secondary | ICD-10-CM | POA: Diagnosis not present

## 2014-11-10 DIAGNOSIS — M6281 Muscle weakness (generalized): Secondary | ICD-10-CM | POA: Diagnosis not present

## 2014-11-10 DIAGNOSIS — Z79899 Other long term (current) drug therapy: Secondary | ICD-10-CM | POA: Diagnosis not present

## 2014-11-10 DIAGNOSIS — I959 Hypotension, unspecified: Secondary | ICD-10-CM | POA: Diagnosis not present

## 2014-11-10 DIAGNOSIS — I480 Paroxysmal atrial fibrillation: Secondary | ICD-10-CM | POA: Diagnosis not present

## 2014-11-10 DIAGNOSIS — D649 Anemia, unspecified: Secondary | ICD-10-CM | POA: Diagnosis not present

## 2014-11-10 DIAGNOSIS — Z7901 Long term (current) use of anticoagulants: Secondary | ICD-10-CM | POA: Diagnosis not present

## 2014-11-10 DIAGNOSIS — N182 Chronic kidney disease, stage 2 (mild): Secondary | ICD-10-CM | POA: Diagnosis not present

## 2014-11-10 DIAGNOSIS — L03031 Cellulitis of right toe: Secondary | ICD-10-CM | POA: Diagnosis not present

## 2014-11-11 DIAGNOSIS — I5040 Unspecified combined systolic (congestive) and diastolic (congestive) heart failure: Secondary | ICD-10-CM | POA: Diagnosis not present

## 2014-11-11 DIAGNOSIS — M6281 Muscle weakness (generalized): Secondary | ICD-10-CM | POA: Diagnosis not present

## 2014-11-12 DIAGNOSIS — M6281 Muscle weakness (generalized): Secondary | ICD-10-CM | POA: Diagnosis not present

## 2014-11-12 DIAGNOSIS — I5040 Unspecified combined systolic (congestive) and diastolic (congestive) heart failure: Secondary | ICD-10-CM | POA: Diagnosis not present

## 2014-11-13 DIAGNOSIS — I480 Paroxysmal atrial fibrillation: Secondary | ICD-10-CM | POA: Diagnosis not present

## 2014-11-13 DIAGNOSIS — Z7901 Long term (current) use of anticoagulants: Secondary | ICD-10-CM | POA: Diagnosis not present

## 2014-11-13 DIAGNOSIS — I959 Hypotension, unspecified: Secondary | ICD-10-CM | POA: Diagnosis not present

## 2014-11-13 DIAGNOSIS — I482 Chronic atrial fibrillation: Secondary | ICD-10-CM | POA: Diagnosis not present

## 2014-11-13 DIAGNOSIS — I5040 Unspecified combined systolic (congestive) and diastolic (congestive) heart failure: Secondary | ICD-10-CM | POA: Diagnosis not present

## 2014-11-13 DIAGNOSIS — S4982XA Other specified injuries of left shoulder and upper arm, initial encounter: Secondary | ICD-10-CM | POA: Diagnosis not present

## 2014-11-13 DIAGNOSIS — M6281 Muscle weakness (generalized): Secondary | ICD-10-CM | POA: Diagnosis not present

## 2014-11-14 DIAGNOSIS — I5040 Unspecified combined systolic (congestive) and diastolic (congestive) heart failure: Secondary | ICD-10-CM | POA: Diagnosis not present

## 2014-11-14 DIAGNOSIS — M6281 Muscle weakness (generalized): Secondary | ICD-10-CM | POA: Diagnosis not present

## 2014-11-17 DIAGNOSIS — N182 Chronic kidney disease, stage 2 (mild): Secondary | ICD-10-CM | POA: Diagnosis not present

## 2014-11-17 DIAGNOSIS — D509 Iron deficiency anemia, unspecified: Secondary | ICD-10-CM | POA: Diagnosis not present

## 2014-11-17 DIAGNOSIS — M6281 Muscle weakness (generalized): Secondary | ICD-10-CM | POA: Diagnosis not present

## 2014-11-17 DIAGNOSIS — Z79899 Other long term (current) drug therapy: Secondary | ICD-10-CM | POA: Diagnosis not present

## 2014-11-17 DIAGNOSIS — R4182 Altered mental status, unspecified: Secondary | ICD-10-CM | POA: Diagnosis not present

## 2014-11-17 DIAGNOSIS — D649 Anemia, unspecified: Secondary | ICD-10-CM | POA: Diagnosis not present

## 2014-11-17 DIAGNOSIS — Z7901 Long term (current) use of anticoagulants: Secondary | ICD-10-CM | POA: Diagnosis not present

## 2014-11-17 DIAGNOSIS — R109 Unspecified abdominal pain: Secondary | ICD-10-CM | POA: Diagnosis not present

## 2014-11-17 DIAGNOSIS — I5040 Unspecified combined systolic (congestive) and diastolic (congestive) heart failure: Secondary | ICD-10-CM | POA: Diagnosis not present

## 2014-11-18 DIAGNOSIS — M6281 Muscle weakness (generalized): Secondary | ICD-10-CM | POA: Diagnosis not present

## 2014-11-18 DIAGNOSIS — I5040 Unspecified combined systolic (congestive) and diastolic (congestive) heart failure: Secondary | ICD-10-CM | POA: Diagnosis not present

## 2014-11-19 DIAGNOSIS — M6281 Muscle weakness (generalized): Secondary | ICD-10-CM | POA: Diagnosis not present

## 2014-11-19 DIAGNOSIS — I5040 Unspecified combined systolic (congestive) and diastolic (congestive) heart failure: Secondary | ICD-10-CM | POA: Diagnosis not present

## 2014-11-20 DIAGNOSIS — R701 Abnormal plasma viscosity: Secondary | ICD-10-CM | POA: Diagnosis not present

## 2014-11-20 DIAGNOSIS — M6281 Muscle weakness (generalized): Secondary | ICD-10-CM | POA: Diagnosis not present

## 2014-11-20 DIAGNOSIS — I480 Paroxysmal atrial fibrillation: Secondary | ICD-10-CM | POA: Diagnosis not present

## 2014-11-20 DIAGNOSIS — I482 Chronic atrial fibrillation: Secondary | ICD-10-CM | POA: Diagnosis not present

## 2014-11-20 DIAGNOSIS — Z7901 Long term (current) use of anticoagulants: Secondary | ICD-10-CM | POA: Diagnosis not present

## 2014-11-20 DIAGNOSIS — S4982XA Other specified injuries of left shoulder and upper arm, initial encounter: Secondary | ICD-10-CM | POA: Diagnosis not present

## 2014-11-20 DIAGNOSIS — I5040 Unspecified combined systolic (congestive) and diastolic (congestive) heart failure: Secondary | ICD-10-CM | POA: Diagnosis not present

## 2014-11-21 DIAGNOSIS — Z7901 Long term (current) use of anticoagulants: Secondary | ICD-10-CM | POA: Diagnosis not present

## 2014-11-21 DIAGNOSIS — D509 Iron deficiency anemia, unspecified: Secondary | ICD-10-CM | POA: Diagnosis not present

## 2014-11-21 DIAGNOSIS — I5040 Unspecified combined systolic (congestive) and diastolic (congestive) heart failure: Secondary | ICD-10-CM | POA: Diagnosis not present

## 2014-11-21 DIAGNOSIS — I4891 Unspecified atrial fibrillation: Secondary | ICD-10-CM | POA: Diagnosis not present

## 2014-11-21 DIAGNOSIS — I480 Paroxysmal atrial fibrillation: Secondary | ICD-10-CM | POA: Diagnosis not present

## 2014-11-21 DIAGNOSIS — D649 Anemia, unspecified: Secondary | ICD-10-CM | POA: Diagnosis not present

## 2014-11-21 DIAGNOSIS — I10 Essential (primary) hypertension: Secondary | ICD-10-CM | POA: Diagnosis not present

## 2014-11-21 DIAGNOSIS — Z79899 Other long term (current) drug therapy: Secondary | ICD-10-CM | POA: Diagnosis not present

## 2014-11-21 DIAGNOSIS — M6281 Muscle weakness (generalized): Secondary | ICD-10-CM | POA: Diagnosis not present

## 2014-11-24 DIAGNOSIS — N189 Chronic kidney disease, unspecified: Secondary | ICD-10-CM | POA: Diagnosis not present

## 2014-11-24 DIAGNOSIS — I480 Paroxysmal atrial fibrillation: Secondary | ICD-10-CM | POA: Diagnosis not present

## 2014-11-24 DIAGNOSIS — I959 Hypotension, unspecified: Secondary | ICD-10-CM | POA: Diagnosis not present

## 2014-11-24 DIAGNOSIS — M6281 Muscle weakness (generalized): Secondary | ICD-10-CM | POA: Diagnosis not present

## 2014-11-24 DIAGNOSIS — I5040 Unspecified combined systolic (congestive) and diastolic (congestive) heart failure: Secondary | ICD-10-CM | POA: Diagnosis not present

## 2014-11-24 DIAGNOSIS — L8995 Pressure ulcer of unspecified site, unstageable: Secondary | ICD-10-CM | POA: Diagnosis not present

## 2014-11-24 DIAGNOSIS — D649 Anemia, unspecified: Secondary | ICD-10-CM | POA: Diagnosis not present

## 2014-11-24 LAB — PROTIME-INR: INR: 2.9 — AB (ref 0.9–1.1)

## 2014-11-25 ENCOUNTER — Telehealth: Payer: Self-pay | Admitting: Orthopedic Surgery

## 2014-11-25 DIAGNOSIS — I4891 Unspecified atrial fibrillation: Secondary | ICD-10-CM | POA: Diagnosis not present

## 2014-11-25 DIAGNOSIS — I482 Chronic atrial fibrillation: Secondary | ICD-10-CM | POA: Diagnosis not present

## 2014-11-25 DIAGNOSIS — I480 Paroxysmal atrial fibrillation: Secondary | ICD-10-CM | POA: Diagnosis not present

## 2014-11-25 DIAGNOSIS — I5040 Unspecified combined systolic (congestive) and diastolic (congestive) heart failure: Secondary | ICD-10-CM | POA: Diagnosis not present

## 2014-11-25 DIAGNOSIS — L8995 Pressure ulcer of unspecified site, unstageable: Secondary | ICD-10-CM | POA: Diagnosis not present

## 2014-11-25 DIAGNOSIS — Z7901 Long term (current) use of anticoagulants: Secondary | ICD-10-CM | POA: Diagnosis not present

## 2014-11-25 DIAGNOSIS — M6281 Muscle weakness (generalized): Secondary | ICD-10-CM | POA: Diagnosis not present

## 2014-11-25 NOTE — Telephone Encounter (Signed)
No   See wound center or podiatry or gen surg

## 2014-11-25 NOTE — Telephone Encounter (Signed)
Call received, and notes faxed, from [facility], with request for Dr Romeo Apple to see her for new problem, wound of foot.  They have faxed the recent notes from house physician for Dr Romeo Apple to review and advise(copy in Dr's box).  Their ph# is [phone].

## 2014-11-25 NOTE — Telephone Encounter (Signed)
Contacted facility to relay Dr Mort Sawyers response; unable to reach nurse on floor; faxed response to [phone].

## 2014-11-26 DIAGNOSIS — I480 Paroxysmal atrial fibrillation: Secondary | ICD-10-CM | POA: Diagnosis not present

## 2014-11-26 DIAGNOSIS — I4891 Unspecified atrial fibrillation: Secondary | ICD-10-CM | POA: Diagnosis not present

## 2014-11-26 DIAGNOSIS — I5040 Unspecified combined systolic (congestive) and diastolic (congestive) heart failure: Secondary | ICD-10-CM | POA: Diagnosis not present

## 2014-11-26 DIAGNOSIS — R6889 Other general symptoms and signs: Secondary | ICD-10-CM | POA: Diagnosis not present

## 2014-11-26 DIAGNOSIS — M6281 Muscle weakness (generalized): Secondary | ICD-10-CM | POA: Diagnosis not present

## 2014-11-26 DIAGNOSIS — I482 Chronic atrial fibrillation: Secondary | ICD-10-CM | POA: Diagnosis not present

## 2014-11-26 DIAGNOSIS — Z7901 Long term (current) use of anticoagulants: Secondary | ICD-10-CM | POA: Diagnosis not present

## 2014-11-26 DIAGNOSIS — Z79899 Other long term (current) drug therapy: Secondary | ICD-10-CM | POA: Diagnosis not present

## 2014-11-26 DIAGNOSIS — L8995 Pressure ulcer of unspecified site, unstageable: Secondary | ICD-10-CM | POA: Diagnosis not present

## 2014-11-26 LAB — PROTIME-INR: INR: 2.5 — AB (ref 0.9–1.1)

## 2014-11-27 DIAGNOSIS — M6281 Muscle weakness (generalized): Secondary | ICD-10-CM | POA: Diagnosis not present

## 2014-11-27 DIAGNOSIS — I5040 Unspecified combined systolic (congestive) and diastolic (congestive) heart failure: Secondary | ICD-10-CM | POA: Diagnosis not present

## 2014-11-27 DIAGNOSIS — L97519 Non-pressure chronic ulcer of other part of right foot with unspecified severity: Secondary | ICD-10-CM | POA: Diagnosis not present

## 2014-11-28 ENCOUNTER — Inpatient Hospital Stay (HOSPITAL_COMMUNITY)
Admission: EM | Admit: 2014-11-28 | Discharge: 2014-12-10 | DRG: 853 | Disposition: A | Discharge status: Hospice | Payer: Medicare Other | Attending: Internal Medicine | Admitting: Internal Medicine

## 2014-11-28 ENCOUNTER — Encounter (HOSPITAL_COMMUNITY): Payer: Self-pay

## 2014-11-28 ENCOUNTER — Emergency Department (HOSPITAL_COMMUNITY): Payer: Medicare Other

## 2014-11-28 DIAGNOSIS — I350 Nonrheumatic aortic (valve) stenosis: Secondary | ICD-10-CM | POA: Diagnosis not present

## 2014-11-28 DIAGNOSIS — I6340 Cerebral infarction due to embolism of unspecified cerebral artery: Secondary | ICD-10-CM | POA: Diagnosis not present

## 2014-11-28 DIAGNOSIS — R29810 Facial weakness: Secondary | ICD-10-CM | POA: Diagnosis not present

## 2014-11-28 DIAGNOSIS — Z7982 Long term (current) use of aspirin: Secondary | ICD-10-CM | POA: Diagnosis not present

## 2014-11-28 DIAGNOSIS — E785 Hyperlipidemia, unspecified: Secondary | ICD-10-CM | POA: Diagnosis present

## 2014-11-28 DIAGNOSIS — Z66 Do not resuscitate: Secondary | ICD-10-CM | POA: Diagnosis not present

## 2014-11-28 DIAGNOSIS — D62 Acute posthemorrhagic anemia: Secondary | ICD-10-CM | POA: Diagnosis not present

## 2014-11-28 DIAGNOSIS — I482 Chronic atrial fibrillation: Secondary | ICD-10-CM | POA: Diagnosis not present

## 2014-11-28 DIAGNOSIS — M25571 Pain in right ankle and joints of right foot: Secondary | ICD-10-CM | POA: Diagnosis not present

## 2014-11-28 DIAGNOSIS — L97819 Non-pressure chronic ulcer of other part of right lower leg with unspecified severity: Secondary | ICD-10-CM | POA: Diagnosis not present

## 2014-11-28 DIAGNOSIS — I5042 Chronic combined systolic (congestive) and diastolic (congestive) heart failure: Secondary | ICD-10-CM | POA: Diagnosis present

## 2014-11-28 DIAGNOSIS — Z515 Encounter for palliative care: Secondary | ICD-10-CM

## 2014-11-28 DIAGNOSIS — R4182 Altered mental status, unspecified: Secondary | ICD-10-CM | POA: Diagnosis not present

## 2014-11-28 DIAGNOSIS — I739 Peripheral vascular disease, unspecified: Secondary | ICD-10-CM | POA: Diagnosis present

## 2014-11-28 DIAGNOSIS — Z79891 Long term (current) use of opiate analgesic: Secondary | ICD-10-CM

## 2014-11-28 DIAGNOSIS — I743 Embolism and thrombosis of arteries of the lower extremities: Secondary | ICD-10-CM | POA: Diagnosis not present

## 2014-11-28 DIAGNOSIS — I129 Hypertensive chronic kidney disease with stage 1 through stage 4 chronic kidney disease, or unspecified chronic kidney disease: Secondary | ICD-10-CM | POA: Diagnosis not present

## 2014-11-28 DIAGNOSIS — A419 Sepsis, unspecified organism: Secondary | ICD-10-CM | POA: Diagnosis not present

## 2014-11-28 DIAGNOSIS — I639 Cerebral infarction, unspecified: Secondary | ICD-10-CM | POA: Diagnosis not present

## 2014-11-28 DIAGNOSIS — I4891 Unspecified atrial fibrillation: Secondary | ICD-10-CM | POA: Diagnosis not present

## 2014-11-28 DIAGNOSIS — T8743 Infection of amputation stump, right lower extremity: Secondary | ICD-10-CM | POA: Insufficient documentation

## 2014-11-28 DIAGNOSIS — Z8673 Personal history of transient ischemic attack (TIA), and cerebral infarction without residual deficits: Secondary | ICD-10-CM

## 2014-11-28 DIAGNOSIS — Z7901 Long term (current) use of anticoagulants: Secondary | ICD-10-CM | POA: Diagnosis not present

## 2014-11-28 DIAGNOSIS — N184 Chronic kidney disease, stage 4 (severe): Secondary | ICD-10-CM | POA: Diagnosis not present

## 2014-11-28 DIAGNOSIS — Z79899 Other long term (current) drug therapy: Secondary | ICD-10-CM | POA: Diagnosis not present

## 2014-11-28 DIAGNOSIS — I480 Paroxysmal atrial fibrillation: Secondary | ICD-10-CM | POA: Diagnosis not present

## 2014-11-28 DIAGNOSIS — N183 Chronic kidney disease, stage 3 (moderate): Secondary | ICD-10-CM | POA: Diagnosis not present

## 2014-11-28 DIAGNOSIS — I96 Gangrene, not elsewhere classified: Secondary | ICD-10-CM | POA: Diagnosis not present

## 2014-11-28 DIAGNOSIS — N189 Chronic kidney disease, unspecified: Secondary | ICD-10-CM | POA: Diagnosis not present

## 2014-11-28 DIAGNOSIS — M810 Age-related osteoporosis without current pathological fracture: Secondary | ICD-10-CM | POA: Diagnosis not present

## 2014-11-28 DIAGNOSIS — Z791 Long term (current) use of non-steroidal anti-inflammatories (NSAID): Secondary | ICD-10-CM | POA: Diagnosis not present

## 2014-11-28 DIAGNOSIS — E1122 Type 2 diabetes mellitus with diabetic chronic kidney disease: Secondary | ICD-10-CM | POA: Diagnosis present

## 2014-11-28 DIAGNOSIS — D509 Iron deficiency anemia, unspecified: Secondary | ICD-10-CM | POA: Diagnosis not present

## 2014-11-28 DIAGNOSIS — L03115 Cellulitis of right lower limb: Secondary | ICD-10-CM | POA: Diagnosis present

## 2014-11-28 DIAGNOSIS — I771 Stricture of artery: Secondary | ICD-10-CM | POA: Diagnosis present

## 2014-11-28 DIAGNOSIS — E1152 Type 2 diabetes mellitus with diabetic peripheral angiopathy with gangrene: Secondary | ICD-10-CM | POA: Diagnosis present

## 2014-11-28 DIAGNOSIS — I348 Other nonrheumatic mitral valve disorders: Secondary | ICD-10-CM | POA: Diagnosis present

## 2014-11-28 DIAGNOSIS — Z87891 Personal history of nicotine dependence: Secondary | ICD-10-CM

## 2014-11-28 DIAGNOSIS — M109 Gout, unspecified: Secondary | ICD-10-CM | POA: Diagnosis not present

## 2014-11-28 DIAGNOSIS — I70261 Atherosclerosis of native arteries of extremities with gangrene, right leg: Secondary | ICD-10-CM | POA: Diagnosis not present

## 2014-11-28 DIAGNOSIS — I63411 Cerebral infarction due to embolism of right middle cerebral artery: Secondary | ICD-10-CM | POA: Diagnosis not present

## 2014-11-28 DIAGNOSIS — L8995 Pressure ulcer of unspecified site, unstageable: Secondary | ICD-10-CM | POA: Diagnosis not present

## 2014-11-28 DIAGNOSIS — R40241 Glasgow coma scale score 13-15: Secondary | ICD-10-CM | POA: Diagnosis not present

## 2014-11-28 DIAGNOSIS — R0600 Dyspnea, unspecified: Secondary | ICD-10-CM | POA: Diagnosis not present

## 2014-11-28 DIAGNOSIS — R5381 Other malaise: Secondary | ICD-10-CM | POA: Insufficient documentation

## 2014-11-28 DIAGNOSIS — Z96641 Presence of right artificial hip joint: Secondary | ICD-10-CM | POA: Diagnosis present

## 2014-11-28 DIAGNOSIS — I252 Old myocardial infarction: Secondary | ICD-10-CM

## 2014-11-28 LAB — PROTIME-INR
INR: 2.6 — AB (ref 0.00–1.49)
PROTHROMBIN TIME: 27.5 s — AB (ref 11.6–15.2)

## 2014-11-28 LAB — CBC WITH DIFFERENTIAL/PLATELET
Basophils Absolute: 0 10*3/uL (ref 0.0–0.1)
Basophils Relative: 0 % (ref 0–1)
EOS ABS: 0.1 10*3/uL (ref 0.0–0.7)
EOS PCT: 1 % (ref 0–5)
HCT: 31.8 % — ABNORMAL LOW (ref 36.0–46.0)
Hemoglobin: 10.6 g/dL — ABNORMAL LOW (ref 12.0–15.0)
LYMPHS ABS: 1.3 10*3/uL (ref 0.7–4.0)
Lymphocytes Relative: 12 % (ref 12–46)
MCH: 33.5 pg (ref 26.0–34.0)
MCHC: 33.3 g/dL (ref 30.0–36.0)
MCV: 100.6 fL — AB (ref 78.0–100.0)
Monocytes Absolute: 0.8 10*3/uL (ref 0.1–1.0)
Monocytes Relative: 8 % (ref 3–12)
Neutro Abs: 8.7 10*3/uL — ABNORMAL HIGH (ref 1.7–7.7)
Neutrophils Relative %: 79 % — ABNORMAL HIGH (ref 43–77)
PLATELETS: 228 10*3/uL (ref 150–400)
RBC: 3.16 MIL/uL — ABNORMAL LOW (ref 3.87–5.11)
RDW: 13 % (ref 11.5–15.5)
WBC: 11 10*3/uL — ABNORMAL HIGH (ref 4.0–10.5)

## 2014-11-28 LAB — COMPREHENSIVE METABOLIC PANEL
ALT: 16 U/L (ref 14–54)
ANION GAP: 12 (ref 5–15)
AST: 29 U/L (ref 15–41)
Albumin: 2.8 g/dL — ABNORMAL LOW (ref 3.5–5.0)
Alkaline Phosphatase: 65 U/L (ref 38–126)
BUN: 37 mg/dL — ABNORMAL HIGH (ref 6–20)
CO2: 24 mmol/L (ref 22–32)
Calcium: 9.5 mg/dL (ref 8.9–10.3)
Chloride: 97 mmol/L — ABNORMAL LOW (ref 101–111)
Creatinine, Ser: 1.28 mg/dL — ABNORMAL HIGH (ref 0.44–1.00)
GFR, EST AFRICAN AMERICAN: 40 mL/min — AB (ref >60)
GFR, EST NON AFRICAN AMERICAN: 34 mL/min — AB (ref >60)
Glucose, Bld: 106 mg/dL — ABNORMAL HIGH (ref 65–99)
Potassium: 5.4 mmol/L — ABNORMAL HIGH (ref 3.5–5.1)
Sodium: 133 mmol/L — ABNORMAL LOW (ref 135–145)
Total Bilirubin: 0.9 mg/dL (ref 0.3–1.2)
Total Protein: 7 g/dL (ref 6.5–8.1)

## 2014-11-28 LAB — I-STAT CG4 LACTIC ACID, ED
LACTIC ACID, VENOUS: 2.2 mmol/L — AB (ref 0.5–2.0)
LACTIC ACID, VENOUS: 3.27 mmol/L — AB (ref 0.5–2.0)

## 2014-11-28 LAB — I-STAT CHEM 8, ED
BUN: 53 mg/dL — ABNORMAL HIGH (ref 6–20)
Calcium, Ion: 1.15 mmol/L (ref 1.13–1.30)
Chloride: 98 mmol/L — ABNORMAL LOW (ref 101–111)
Creatinine, Ser: 1.3 mg/dL — ABNORMAL HIGH (ref 0.44–1.00)
Glucose, Bld: 104 mg/dL — ABNORMAL HIGH (ref 65–99)
HCT: 45 % (ref 36.0–46.0)
Hemoglobin: 15.3 g/dL — ABNORMAL HIGH (ref 12.0–15.0)
Potassium: 4.7 mmol/L (ref 3.5–5.1)
Sodium: 135 mmol/L (ref 135–145)
TCO2: 25 mmol/L (ref 0–100)

## 2014-11-28 MED ORDER — VANCOMYCIN HCL IN DEXTROSE 750-5 MG/150ML-% IV SOLN
750.0000 mg | INTRAVENOUS | Status: DC
  Administered 2014-11-29 – 2014-12-05 (×7): 750 mg via INTRAVENOUS
  Filled 2014-11-28 (×10): qty 150

## 2014-11-28 MED ORDER — SODIUM CHLORIDE 0.9 % IV SOLN
INTRAVENOUS | Status: DC
  Administered 2014-11-28: 23:00:00 via INTRAVENOUS

## 2014-11-28 MED ORDER — METOPROLOL TARTRATE 50 MG PO TABS
50.0000 mg | ORAL_TABLET | Freq: Two times a day (BID) | ORAL | Status: DC
  Administered 2014-11-28 – 2014-12-02 (×6): 50 mg via ORAL
  Filled 2014-11-28 (×10): qty 1

## 2014-11-28 MED ORDER — PIPERACILLIN-TAZOBACTAM 3.375 G IVPB
3.3750 g | Freq: Once | INTRAVENOUS | Status: AC
  Administered 2014-11-28: 3.375 g via INTRAVENOUS
  Filled 2014-11-28: qty 50

## 2014-11-28 MED ORDER — MORPHINE SULFATE (PF) 2 MG/ML IV SOLN
2.0000 mg | Freq: Once | INTRAVENOUS | Status: AC
  Administered 2014-11-28: 2 mg via INTRAVENOUS
  Filled 2014-11-28: qty 1

## 2014-11-28 MED ORDER — ONDANSETRON HCL 4 MG/2ML IJ SOLN: 4.0000 mg | Freq: Four times a day (QID) | INTRAMUSCULAR | Status: DC | PRN

## 2014-11-28 MED ORDER — PHYTONADIONE 5 MG PO TABS
5.0000 mg | ORAL_TABLET | Freq: Once | ORAL | Status: AC
  Administered 2014-11-29: 5 mg via ORAL
  Filled 2014-11-28: qty 1

## 2014-11-28 MED ORDER — ONDANSETRON HCL 4 MG PO TABS: 4.0000 mg | ORAL_TABLET | Freq: Four times a day (QID) | ORAL | Status: DC | PRN

## 2014-11-28 MED ORDER — ACETAMINOPHEN 500 MG PO TABS
500.0000 mg | ORAL_TABLET | Freq: Three times a day (TID) | ORAL | Status: DC
  Administered 2014-11-28 – 2014-11-29 (×2): 500 mg via ORAL
  Filled 2014-11-28 (×5): qty 1

## 2014-11-28 MED ORDER — ATORVASTATIN CALCIUM 10 MG PO TABS
10.0000 mg | ORAL_TABLET | Freq: Every day | ORAL | Status: DC
  Administered 2014-11-29 – 2014-12-07 (×8): 10 mg via ORAL
  Filled 2014-11-28 (×13): qty 1

## 2014-11-28 MED ORDER — MORPHINE SULFATE (PF) 2 MG/ML IV SOLN
2.0000 mg | INTRAVENOUS | Status: DC | PRN
  Filled 2014-11-28: qty 1

## 2014-11-28 MED ORDER — VANCOMYCIN HCL IN DEXTROSE 1-5 GM/200ML-% IV SOLN
1000.0000 mg | Freq: Once | INTRAVENOUS | Status: AC
  Administered 2014-11-28: 1000 mg via INTRAVENOUS
  Filled 2014-11-28: qty 200

## 2014-11-28 MED ORDER — PIPERACILLIN-TAZOBACTAM 3.375 G IVPB
3.3750 g | Freq: Three times a day (TID) | INTRAVENOUS | Status: DC
  Administered 2014-11-29: 3.375 g via INTRAVENOUS
  Filled 2014-11-28 (×3): qty 50

## 2014-11-28 NOTE — ED Provider Notes (Signed)
CSN: [id number]     Arrival date & time 11/28/14  1738 History   First MD Initiated Contact with Patient 11/28/14 1740     Chief Complaint  Patient presents with  . Poor Circulation     (Consider location/radiation/quality/duration/timing/severity/associated sxs/prior Treatment) Patient is a 81 y.o. female presenting with toe pain. The history is provided by the nursing home (Patient sent from nursing home for black right great toe.   for a few days).  Toe Pain This is a new problem. The current episode started more than 2 days ago. The problem occurs constantly. The problem has not changed since onset.Pertinent negatives include no chest pain, no abdominal pain and no headaches. Nothing aggravates the symptoms. Nothing relieves the symptoms.    Past Medical History  Diagnosis Date  . Hyperlipidemia   . Osteoporosis   . Hypertension     with severe left ventricular hypertrophy; normal ejection fraction in 04/2005  . Valvular heart disease     Mild to moderate aortic stenosis; moderate to severe mitral stenosis in 2007; mild MR  . Paroxysmal atrial fibrillation     Remote  . Diverticulosis     Pancolonic; h/o LGI bleeding and diverticulitis  . Degenerative joint disease     Knees  . Chronic kidney disease     Mild; creatinine of 1.19-1.28 in recent years  . Anemia, iron deficiency   . Popliteal cyst     Left  . Fracture of wrist 2008    left  . Gout   . Stroke right side deficit  . NSTEMI, initial episode of care 01/05/2012  . Chronic combined systolic and diastolic congestive heart failure 02/2013    EF 30%; Grade 2 diastolic dys   Past Surgical History  Procedure Laterality Date  . Appendectomy  1944  . Total hip arthroplasty  1994   Family History  Problem Relation Age of Onset  . Diabetes Mother    Social History  Substance Use Topics  . Smoking status: Former Smoker    Types: Cigarettes    Quit date: 08/31/1972  . Smokeless tobacco: Never Used  . Alcohol  Use: No   OB History    No data available     Review of Systems  Constitutional: Negative for appetite change and fatigue.  HENT: Negative for congestion, ear discharge and sinus pressure.   Eyes: Negative for discharge.  Respiratory: Negative for cough.   Cardiovascular: Negative for chest pain.  Gastrointestinal: Negative for abdominal pain and diarrhea.  Genitourinary: Negative for frequency and hematuria.  Musculoskeletal: Negative for back pain.       Right great toe black  Skin: Negative for rash.  Neurological: Negative for seizures and headaches.  Psychiatric/Behavioral: Negative for hallucinations.      Allergies  Review of patient's allergies indicates no known allergies.  Home Medications   Prior to Admission medications   Medication Sig Start Date End Date Taking? Authorizing Provider  allopurinol (ZYLOPRIM) 100 MG tablet TAKE ONE TABLET BY MOUTH ONCE DAILY. 10/28/13  Yes Kerri Perches, MD  Amino Acids-Protein Hydrolys (FEEDING SUPPLEMENT, PRO-STAT SUGAR FREE 64,) LIQD Take 30 mLs by mouth 2 (two) times daily with a meal.    Yes Historical Provider, MD  atorvastatin (LIPITOR) 10 MG tablet Take 10 mg by mouth daily at 6 PM.   Yes Historical Provider, MD  collagenase (SANTYL) ointment Apply 1 application topically daily.   Yes Historical Provider, MD  furosemide (LASIX) 40 MG tablet TAKE ONE TABLET  BY MOUTH ONCE DAILY. 12/24/13  Yes Laqueta Linden, MD  metoprolol (LOPRESSOR) 50 MG tablet Take 50 mg by mouth 2 (two) times daily.    Yes Historical Provider, MD  ondansetron (ZOFRAN) 4 MG tablet Take 4 mg by mouth every 8 (eight) hours as needed for nausea or vomiting.   Yes Historical Provider, MD  pantoprazole (PROTONIX) 20 MG tablet Take 20 mg by mouth 2 (two) times daily.   Yes Historical Provider, MD  Wheat Dextrin (BENEFIBER PO) Take 1 each by mouth daily.   Yes Historical Provider, MD  acetaminophen (TYLENOL) 500 MG tablet Take 1 tablet (500 mg total) by  mouth every 8 (eight) hours. 02/12/14   Gwenyth Bender, NP  alendronate (FOSAMAX) 70 MG tablet TAKE 1 TAB EACH WEEK 30 MIN PRIOR TO BREAKFAST WITH LARGE GLASS OF WATER. REMAIN UPRIGHT. Patient taking differently: TAKE 1 TAB EACH WEEK 30 MIN PRIOR TO BREAKFAST WITH LARGE GLASS OF WATER. REMAIN UPRIGHT. Takes on Friday 12/06/13   Kerri Perches, MD  ASPIRIN LOW DOSE 81 MG EC tablet TAKE ONE TABLET BY MOUTH ONCE DAILY. 10/28/13   Kerri Perches, MD  benzonatate (TESSALON) 100 MG capsule Take 1 capsule (100 mg total) by mouth 3 (three) times daily as needed for cough. 02/05/14   Gwenyth Bender, NP  Calcium Carb-Cholecalciferol 500-400 MG-UNIT TABS TAKE (1) TABLET BY MOUTH (3) TIMES DAILY. 07/24/13   Kerri Perches, MD  cefUROXime (CEFTIN) 250 MG tablet Take 1 tablet (250 mg total) by mouth 2 (two) times daily with a meal. 02/12/14   Gwenyth Bender, NP  COLACE 50 MG capsule TAKE (1) CAPSULE BY MOUTH TWICE DAILY. 10/28/13   Kerri Perches, MD  diclofenac sodium (VOLTAREN) 1 % GEL Apply 2 g topically 3 (three) times daily. 02/12/14   Gwenyth Bender, NP  feeding supplement, ENSURE COMPLETE, (ENSURE COMPLETE) LIQD Take 237 mLs by mouth 2 (two) times daily between meals. 02/05/14   Gwenyth Bender, NP  fluticasone (FLONASE) 50 MCG/ACT nasal spray USE 1 SPRAY IN EACH NOSTRIL ONCE DAILY. 07/23/13   Kerri Perches, MD  HYDROcodone-acetaminophen (NORCO/VICODIN) 5-325 MG per tablet Take 1 tablet by mouth every 6 (six) hours as needed (pain). 02/12/14   Gwenyth Bender, NP  menthol-cetylpyridinium (CEPACOL) 3 MG lozenge Take 1 lozenge (3 mg total) by mouth as needed for sore throat. 02/05/14   Gwenyth Bender, NP  potassium chloride (K-DUR) 10 MEQ tablet TAKE 1 TABLET BY MOUTH ON MONDAY,WEDNESDAY AND FRIDAY. 10/28/13   Kerri Perches, MD  simvastatin (ZOCOR) 20 MG tablet TAKE (1) TABLET BY MOUTH AT BEDTIME FOR CHOLESTEROL. 10/28/13   Kerri Perches, MD  warfarin (COUMADIN) 1 MG tablet TAKE 1 TABLET BY MOUTH  ONCE A DAY,ALONG WITH 2.5 MG DAILY TO EQUAL 3.5 MG. Patient not taking: Reported on 02/11/2014 10/28/13   Jodelle Gross, NP  warfarin (COUMADIN) 2.5 MG tablet TAKE 1 TABLET BY MOUTH ONCE A DAY,ALONG WITH 1 MG TO EQUAL 3.5 MG DAILY. Patient not taking: Reported on 02/11/2014 12/23/13   Laqueta Linden, MD   BP 119/69 mmHg  Pulse 93  Temp(Src) 98.5 F (36.9 C) (Oral)  Resp 13  SpO2 96% Physical Exam  Constitutional: She is oriented to person, place, and time. She appears well-developed.  HENT:  Head: Normocephalic.  Eyes: Conjunctivae and EOM are normal. No scleral icterus.  Neck: Neck supple. No thyromegaly present.  Cardiovascular: Normal rate and regular rhythm.  Exam reveals no gallop and no friction rub.   No murmur heard. Pulmonary/Chest: No stridor. She has no wheezes. She has no rales. She exhibits no tenderness.  Abdominal: She exhibits no distension. There is no tenderness. There is no rebound.  Musculoskeletal: She exhibits edema.  Right great toe gangrenous and black. Right foot swelling and red distally. Tender. Dorsalis pedis pulse 1+.  Lymphadenopathy:    She has no cervical adenopathy.  Neurological: She is oriented to person, place, and time. She exhibits normal muscle tone. Coordination normal.  Skin: No rash noted. No erythema.  Psychiatric: She has a normal mood and affect. Her behavior is normal.    ED Course  Procedures (including critical care time) Labs Review Labs Reviewed  CBC WITH DIFFERENTIAL/PLATELET - Abnormal; Notable for the following:    WBC 11.0 (*)    RBC 3.16 (*)    Hemoglobin 10.6 (*)    HCT 31.8 (*)    MCV 100.6 (*)    Neutrophils Relative % 79 (*)    Neutro Abs 8.7 (*)    All other components within normal limits  COMPREHENSIVE METABOLIC PANEL - Abnormal; Notable for the following:    Sodium 133 (*)    Potassium 5.4 (*)    Chloride 97 (*)    Glucose, Bld 106 (*)    BUN 37 (*)    Creatinine, Ser 1.28 (*)    Albumin 2.8 (*)     GFR calc non Af Amer 34 (*)    GFR calc Af Amer 40 (*)    All other components within normal limits  PROTIME-INR - Abnormal; Notable for the following:    Prothrombin Time 27.5 (*)    INR 2.60 (*)    All other components within normal limits  I-STAT CG4 LACTIC ACID, ED - Abnormal; Notable for the following:    Lactic Acid, Venous 2.20 (*)    All other components within normal limits  I-STAT CHEM 8, ED - Abnormal; Notable for the following:    Chloride 98 (*)    BUN 53 (*)    Creatinine, Ser 1.30 (*)    Glucose, Bld 104 (*)    Hemoglobin 15.3 (*)    All other components within normal limits  CULTURE, BLOOD (ROUTINE X 2)  CULTURE, BLOOD (ROUTINE X 2)  CBG MONITORING, ED  I-STAT CG4 LACTIC ACID, ED    Imaging Review Dg Foot Complete Right  11/28/2014   CLINICAL DATA:  Right toe erythema and discoloration, with pain. Initial encounter.  EXAM: RIGHT FOOT COMPLETE - 3+ VIEW  COMPARISON:  None.  FINDINGS: Diffuse soft tissue air is noted tracking about the great toe, concerning for necrotizing fasciitis. Underlying lucency within the first distal phalanx raises concern for osteomyelitis. There may be osteomyelitis involving the distal aspect of the first proximal phalanx, though this is difficult to fully characterize on radiograph.  Diffuse vascular calcifications are seen. Visualized joint spaces are grossly unremarkable.  IMPRESSION: 1. Diffuse soft tissue air tracks about the great toe, concerning for necrotizing fasciitis. Underlying lucency within the first distal phalanx raises concern for osteomyelitis. There may be osteomyelitis involving the distal aspect of the first proximal phalanx, though this is difficult to fully characterize on radiograph. 2. Diffuse vascular calcifications seen.  These results were called by telephone at the time of interpretation on 11/28/2014 at 7:02 pm to Dr. Bethann Berkshire, who verbally acknowledged these results.   Electronically Signed   By: Beryle Beams.D.  On: 11/28/2014 19:06   I have personally reviewed and evaluated these images and lab results as part of my medical decision-making.   EKG Interpretation None      MDM   Final diagnoses:  Gangrene    Gangrenous right toe. Possible necrotizing fasciitis. I spoke with vascular surgeon Dr. Manson Passey. He stated with a dorsalis pedis pulse the patient should be seen by the orthopedic surgeon and admitted by hospitalist. Spoke with Dr. Bonita Quin the orthopedic surgeon who stated that no surgery needed to be done tonight. Hospitalist will admit and Dr. Kerrin Mo or partner to see pt in am.    Bethann Berkshire, MD 11/28/14 2109

## 2014-11-28 NOTE — ED Notes (Signed)
MD at bedside. 

## 2014-11-28 NOTE — ED Notes (Signed)
Per EMS: Pt from [facility]. Pt sent here for vascular consult of R great toe. Toe is black and painful upon arrival. Foot is red and hot. Pt complaining 6/10 pain. A/O x2 at baseline.

## 2014-11-28 NOTE — H&P (Signed)
PCP:   [name], MD   Chief Complaint:  Right toe pain  HPI:  81 year old female who  has a past medical history of Hyperlipidemia; Osteoporosis; Hypertension; Valvular heart disease; Paroxysmal atrial fibrillation; Diverticulosis; Degenerative joint disease; Chronic kidney disease; Anemia, iron deficiency; Popliteal cyst; Fracture of wrist (2008); Gout; Stroke (right side deficit); NSTEMI, initial episode of care (01/05/2012); and Chronic combined systolic and diastolic congestive heart failure (02/2013). Today was sent to the ED from nursing home for worsening of right toe pain along with black colored discoloration. Patient says that this has been going on for past few days, but recently has become worse. She endorses severe pain. Denies fever.  Denies chest pain, no shortness of breath. No nausea vomiting or diarrhea. Orthopedic surgeon Dr. Dion Saucier was consulted by the ED physician who will see the patient in a.m.  Allergies:  No Known Allergies    Past Medical History  Diagnosis Date  . Hyperlipidemia   . Osteoporosis   . Hypertension     with severe left ventricular hypertrophy; normal ejection fraction in 04/2005  . Valvular heart disease     Mild to moderate aortic stenosis; moderate to severe mitral stenosis in 2007; mild MR  . Paroxysmal atrial fibrillation     Remote  . Diverticulosis     Pancolonic; h/o LGI bleeding and diverticulitis  . Degenerative joint disease     Knees  . Chronic kidney disease     Mild; creatinine of 1.19-1.28 in recent years  . Anemia, iron deficiency   . Popliteal cyst     Left  . Fracture of wrist 2008    left  . Gout   . Stroke right side deficit  . NSTEMI, initial episode of care 01/05/2012  . Chronic combined systolic and diastolic congestive heart failure 02/2013    EF 30%; Grade 2 diastolic dys    Past Surgical History  Procedure Laterality Date  . Appendectomy  1944  . Total hip arthroplasty  1994    Prior to  Admission medications   Medication Sig Start Date End Date Taking? Authorizing Provider  allopurinol (ZYLOPRIM) 100 MG tablet TAKE ONE TABLET BY MOUTH ONCE DAILY. 10/28/13  Yes Kerri Perches, MD  Amino Acids-Protein Hydrolys (FEEDING SUPPLEMENT, PRO-STAT SUGAR FREE 64,) LIQD Take 30 mLs by mouth 2 (two) times daily with a meal.    Yes Historical Provider, MD  atorvastatin (LIPITOR) 10 MG tablet Take 10 mg by mouth daily at 6 PM.   Yes Historical Provider, MD  collagenase (SANTYL) ointment Apply 1 application topically daily.   Yes Historical Provider, MD  furosemide (LASIX) 40 MG tablet TAKE ONE TABLET BY MOUTH ONCE DAILY. 12/24/13  Yes Laqueta Linden, MD  metoprolol (LOPRESSOR) 50 MG tablet Take 50 mg by mouth 2 (two) times daily.    Yes Historical Provider, MD  ondansetron (ZOFRAN) 4 MG tablet Take 4 mg by mouth every 8 (eight) hours as needed for nausea or vomiting.   Yes Historical Provider, MD  pantoprazole (PROTONIX) 20 MG tablet Take 20 mg by mouth 2 (two) times daily.   Yes Historical Provider, MD  Wheat Dextrin (BENEFIBER PO) Take 1 each by mouth daily.   Yes Historical Provider, MD  acetaminophen (TYLENOL) 500 MG tablet Take 1 tablet (500 mg total) by mouth every 8 (eight) hours. 02/12/14   Gwenyth Bender, NP  alendronate (FOSAMAX) 70 MG tablet TAKE 1 TAB EACH WEEK 30 MIN PRIOR TO BREAKFAST WITH LARGE GLASS  OF WATER. REMAIN UPRIGHT. Patient taking differently: TAKE 1 TAB EACH WEEK 30 MIN PRIOR TO BREAKFAST WITH LARGE GLASS OF WATER. REMAIN UPRIGHT. Takes on Friday 12/06/13   Kerri Perches, MD  ASPIRIN LOW DOSE 81 MG EC tablet TAKE ONE TABLET BY MOUTH ONCE DAILY. 10/28/13   Kerri Perches, MD  benzonatate (TESSALON) 100 MG capsule Take 1 capsule (100 mg total) by mouth 3 (three) times daily as needed for cough. 02/05/14   Gwenyth Bender, NP  Calcium Carb-Cholecalciferol 500-400 MG-UNIT TABS TAKE (1) TABLET BY MOUTH (3) TIMES DAILY. 07/24/13   Kerri Perches, MD  cefUROXime  (CEFTIN) 250 MG tablet Take 1 tablet (250 mg total) by mouth 2 (two) times daily with a meal. 02/12/14   Gwenyth Bender, NP  COLACE 50 MG capsule TAKE (1) CAPSULE BY MOUTH TWICE DAILY. 10/28/13   Kerri Perches, MD  diclofenac sodium (VOLTAREN) 1 % GEL Apply 2 g topically 3 (three) times daily. 02/12/14   Gwenyth Bender, NP  feeding supplement, ENSURE COMPLETE, (ENSURE COMPLETE) LIQD Take 237 mLs by mouth 2 (two) times daily between meals. 02/05/14   Gwenyth Bender, NP  fluticasone (FLONASE) 50 MCG/ACT nasal spray USE 1 SPRAY IN EACH NOSTRIL ONCE DAILY. 07/23/13   Kerri Perches, MD  HYDROcodone-acetaminophen (NORCO/VICODIN) 5-325 MG per tablet Take 1 tablet by mouth every 6 (six) hours as needed (pain). 02/12/14   Gwenyth Bender, NP  menthol-cetylpyridinium (CEPACOL) 3 MG lozenge Take 1 lozenge (3 mg total) by mouth as needed for sore throat. 02/05/14   Gwenyth Bender, NP  potassium chloride (K-DUR) 10 MEQ tablet TAKE 1 TABLET BY MOUTH ON MONDAY,WEDNESDAY AND FRIDAY. 10/28/13   Kerri Perches, MD  simvastatin (ZOCOR) 20 MG tablet TAKE (1) TABLET BY MOUTH AT BEDTIME FOR CHOLESTEROL. 10/28/13   Kerri Perches, MD  warfarin (COUMADIN) 1 MG tablet TAKE 1 TABLET BY MOUTH ONCE A DAY,ALONG WITH 2.5 MG DAILY TO EQUAL 3.5 MG. Patient not taking: Reported on 02/11/2014 10/28/13   Jodelle Gross, NP  warfarin (COUMADIN) 2.5 MG tablet TAKE 1 TABLET BY MOUTH ONCE A DAY,ALONG WITH 1 MG TO EQUAL 3.5 MG DAILY. Patient not taking: Reported on 02/11/2014 12/23/13   Laqueta Linden, MD    Social History:  reports that she quit smoking about 42 years ago. Her smoking use included Cigarettes. She has never used smokeless tobacco. She reports that she does not drink alcohol or use illicit drugs.  Family History  Problem Relation Age of Onset  . Diabetes Mother      All the positives are listed in BOLD  Review of Systems:  HEENT: Headache, blurred vision, runny nose, sore throat Neck: Hypothyroidism,  hyperthyroidism,,lymphadenopathy Chest : Shortness of breath, history of COPD, Asthma Heart : Chest pain, history of coronary arterey disease GI:  Nausea, vomiting, diarrhea, constipation, GERD GU: Dysuria, urgency, frequency of urination, hematuria Neuro: Stroke, seizures, syncope Psych: Depression, anxiety, hallucinations   Physical Exam: Blood pressure 108/69, pulse 41, temperature 98.5 F (36.9 C), temperature source Oral, resp. rate 19, SpO2 100 %. Constitutional:   Patient is a well-developed and well-nourished female in no acute distress and cooperative with exam. Head: Normocephalic and atraumatic Mouth: Mucus membranes moist Eyes: PERRL, EOMI, conjunctivae normal Neck: Supple, No Thyromegaly Cardiovascular: RRR, S1 normal, S2 normal Pulmonary/Chest: CTAB, no wheezes, rales, or rhonchi Abdominal: Soft. Non-tender, non-distended, bowel sounds are normal, no masses, organomegaly, or guarding present.  Neurological: A&O x3,  Strength is normal and symmetric bilaterally, cranial nerve II-XII are grossly intact, no focal motor deficit, sensory intact to light touch bilaterally.  Extremities : Right great toe is gangrenous, foul-smelling. Edema and erythema noted in the right foot. Tender to palpation. Dorsalis pedis 1+, posterior tibial 1+  Labs on Admission:  Basic Metabolic Panel:  Recent Labs Lab 11/28/14 1834 11/28/14 1944  NA 133* 135  K 5.4* 4.7  CL 97* 98*  CO2 24  --   GLUCOSE 106* 104*  BUN 37* 53*  CREATININE 1.28* 1.30*  CALCIUM 9.5  --    Liver Function Tests:  Recent Labs Lab 11/28/14 1834  AST 29  ALT 16  ALKPHOS 65  BILITOT 0.9  PROT 7.0  ALBUMIN 2.8*   No results for input(s): LIPASE, AMYLASE in the last 168 hours. No results for input(s): AMMONIA in the last 168 hours. CBC:  Recent Labs Lab 11/28/14 1834 11/28/14 1944  WBC 11.0*  --   NEUTROABS 8.7*  --   HGB 10.6* 15.3*  HCT 31.8* 45.0  MCV 100.6*  --   PLT 228  --    Radiological  Exams on Admission: Dg Foot Complete Right  11/28/2014   CLINICAL DATA:  Right toe erythema and discoloration, with pain. Initial encounter.  EXAM: RIGHT FOOT COMPLETE - 3+ VIEW  COMPARISON:  None.  FINDINGS: Diffuse soft tissue air is noted tracking about the great toe, concerning for necrotizing fasciitis. Underlying lucency within the first distal phalanx raises concern for osteomyelitis. There may be osteomyelitis involving the distal aspect of the first proximal phalanx, though this is difficult to fully characterize on radiograph.  Diffuse vascular calcifications are seen. Visualized joint spaces are grossly unremarkable.  IMPRESSION: 1. Diffuse soft tissue air tracks about the great toe, concerning for necrotizing fasciitis. Underlying lucency within the first distal phalanx raises concern for osteomyelitis. There may be osteomyelitis involving the distal aspect of the first proximal phalanx, though this is difficult to fully characterize on radiograph. 2. Diffuse vascular calcifications seen.  These results were called by telephone at the time of interpretation on 11/28/2014 at 7:02 pm to Dr. Bethann Berkshire, who verbally acknowledged these results.   Electronically Signed   By: Roanna Raider M.D.   On: 11/28/2014 19:06    EKG: Independently reviewed. Atrial fibrillation   Assessment/Plan Active Problems:   Paroxysmal atrial fibrillation   Chronic kidney disease   Long term current use of anticoagulant therapy   Gangrene of foot  Right great toe gangrene Patient has gangrenous right great toe. We'll start vancomycin and Zosyn per pharmacy for associated cellulitis. Orthopedic surgery Dr. Dion Saucier has been consulted Will see the patient in a.m.  Warfarin anticoagulation Patient is on warfarin for A. Fib. INR is 2.6 Will give 1 dose of vitamin K 5 mg by mouth 1 in anticipation for surgery in a.m. Follow PT/INR in a.m.  Atrial fibrillation Heart rate is controlled, continue  metoprolol Will hold the Coumadin in anticipation for surgery  CKD stage III Creatinine is stable, follow BMP in a.m.  Code status: Full code  Family discussion: Admission, patients condition and plan of care including tests being ordered have been discussed with the patient and *her niece on phone  who indicate understanding and agree with the plan and Code Status. Unable to contact daughter at this time  Time Spent on Admission: 60 min  [facility] Pager: [phone] 11/28/2014, 9:29 PM  If 7PM-7AM, please contact night-coverage  [email or web address]  Password [email or web address]

## 2014-11-28 NOTE — ED Notes (Signed)
RN attempt x 1 to call report to floor;RN to call back

## 2014-11-28 NOTE — Progress Notes (Addendum)
ANTIBIOTIC CONSULT NOTE - INITIAL  Pharmacy Consult for vancomycin and Zosyn Indication: gangrenous toe with surrounding cellulitis  No Known Allergies  Patient Measurements:   Adjusted Body Weight: 62 kg  Vital Signs: Temp: 98.5 F (36.9 C) (09/02 1739) Temp Source: Oral (09/02 1739) BP: 108/69 mmHg (09/02 2100) Pulse Rate: 41 (09/02 2100) Intake/Output from previous day:   Intake/Output from this shift: Total I/O In: 250 [I.V.:250] Out: -   Labs:  Recent Labs  11/28/14 1834 11/28/14 1944  WBC 11.0*  --   HGB 10.6* 15.3*  PLT 228  --   CREATININE 1.28* 1.30*   Estimated creatinine clearance - 17mL/min  Microbiology: No results found for this or any previous visit (from the past 720 hour(s)).  Medical History: Past Medical History  Diagnosis Date  . Hyperlipidemia   . Osteoporosis   . Hypertension     with severe left ventricular hypertrophy; normal ejection fraction in 04/2005  . Valvular heart disease     Mild to moderate aortic stenosis; moderate to severe mitral stenosis in 2007; mild MR  . Paroxysmal atrial fibrillation     Remote  . Diverticulosis     Pancolonic; h/o LGI bleeding and diverticulitis  . Degenerative joint disease     Knees  . Chronic kidney disease     Mild; creatinine of 1.19-1.28 in recent years  . Anemia, iron deficiency   . Popliteal cyst     Left  . Fracture of wrist 2008    left  . Gout   . Stroke right side deficit  . NSTEMI, initial episode of care 01/05/2012  . Chronic combined systolic and diastolic congestive heart failure 02/2013    EF 30%; Grade 2 diastolic dys    Medications:  Prescriptions prior to admission  Medication Sig Dispense Refill Last Dose  . allopurinol (ZYLOPRIM) 100 MG tablet TAKE ONE TABLET BY MOUTH ONCE DAILY. 30 tablet 3 11/28/2014 at Unknown time  . Amino Acids-Protein Hydrolys (FEEDING SUPPLEMENT, PRO-STAT SUGAR FREE 64,) LIQD Take 30 mLs by mouth 2 (two) times daily with a meal.    11/28/2014 at  Unknown time  . atorvastatin (LIPITOR) 10 MG tablet Take 10 mg by mouth daily at 6 PM.   11/27/2014 at Unknown time  . collagenase (SANTYL) ointment Apply 1 application topically daily.   11/27/2014 at Unknown time  . furosemide (LASIX) 40 MG tablet TAKE ONE TABLET BY MOUTH ONCE DAILY. 30 tablet 0 11/28/2014 at Unknown time  . metoprolol (LOPRESSOR) 50 MG tablet Take 50 mg by mouth 2 (two) times daily.    11/27/2014 at 2100  . ondansetron (ZOFRAN) 4 MG tablet Take 4 mg by mouth every 8 (eight) hours as needed for nausea or vomiting.   prn  . pantoprazole (PROTONIX) 20 MG tablet Take 20 mg by mouth 2 (two) times daily.   11/28/2014 at Unknown time  . Wheat Dextrin (BENEFIBER PO) Take 1 each by mouth daily.   11/28/2014 at Unknown time  . acetaminophen (TYLENOL) 500 MG tablet Take 1 tablet (500 mg total) by mouth every 8 (eight) hours. 30 tablet 0 10/25/2014  . alendronate (FOSAMAX) 70 MG tablet TAKE 1 TAB EACH WEEK 30 MIN PRIOR TO BREAKFAST WITH LARGE GLASS OF WATER. REMAIN UPRIGHT. (Patient taking differently: TAKE 1 TAB EACH WEEK 30 MIN PRIOR TO BREAKFAST WITH LARGE GLASS OF WATER. REMAIN UPRIGHT. Takes on Friday) 4 tablet 3 10/24/2014  . ASPIRIN LOW DOSE 81 MG EC tablet TAKE ONE TABLET BY MOUTH  ONCE DAILY. 30 tablet 3 02/10/2014 at Unknown time  . benzonatate (TESSALON) 100 MG capsule Take 1 capsule (100 mg total) by mouth 3 (three) times daily as needed for cough. 20 capsule 0 Past Week at Unknown time  . Calcium Carb-Cholecalciferol 500-400 MG-UNIT TABS TAKE (1) TABLET BY MOUTH (3) TIMES DAILY. 90 tablet 6 02/10/2014 at Unknown time  . cefUROXime (CEFTIN) 250 MG tablet Take 1 tablet (250 mg total) by mouth 2 (two) times daily with a meal. 8 tablet 0   . COLACE 50 MG capsule TAKE (1) CAPSULE BY MOUTH TWICE DAILY. 60 capsule 3 02/10/2014 at Unknown time  . diclofenac sodium (VOLTAREN) 1 % GEL Apply 2 g topically 3 (three) times daily. 1 Tube 0   . feeding supplement, ENSURE COMPLETE, (ENSURE COMPLETE) LIQD Take  237 mLs by mouth 2 (two) times daily between meals.   10/25/2014  . fluticasone (FLONASE) 50 MCG/ACT nasal spray USE 1 SPRAY IN EACH NOSTRIL ONCE DAILY. 16 g 2 10/25/2014  . HYDROcodone-acetaminophen (NORCO/VICODIN) 5-325 MG per tablet Take 1 tablet by mouth every 6 (six) hours as needed (pain). 30 tablet 0 10/13/2014  . menthol-cetylpyridinium (CEPACOL) 3 MG lozenge Take 1 lozenge (3 mg total) by mouth as needed for sore throat. 100 tablet 12 Past Week at Unknown time  . potassium chloride (K-DUR) 10 MEQ tablet TAKE 1 TABLET BY MOUTH ON MONDAY,WEDNESDAY AND FRIDAY. 12 tablet 3 02/10/2014 at Unknown time  . simvastatin (ZOCOR) 20 MG tablet TAKE (1) TABLET BY MOUTH AT BEDTIME FOR CHOLESTEROL. 30 tablet 3 02/10/2014 at Unknown time  . warfarin (COUMADIN) 1 MG tablet TAKE 1 TABLET BY MOUTH ONCE A DAY,ALONG WITH 2.5 MG DAILY TO EQUAL 3.5 MG. (Patient not taking: Reported on 02/11/2014) 30 tablet 3   . warfarin (COUMADIN) 2.5 MG tablet TAKE 1 TABLET BY MOUTH ONCE A DAY,ALONG WITH 1 MG TO EQUAL 3.5 MG DAILY. (Patient not taking: Reported on 02/11/2014) 30 tablet 3    Assessment: 81 year old woman admitted with right big toe gangrene.  She has already received vancomycin 1g and Zosyn 3.375g in the ED.  Pharmacy has been consulted for further dosing.  She is also on warfarin for AFIB with and INR of 2.6.  Warfarin is on hold for possible surgery.  Oral vitamin K  has been ordered to reverse her INR in preparation for possible surgery.  Goal of Therapy:  Vancomycin trough level 10-15 mcg/ml  Plan:  Measure antibiotic drug levels at steady state Follow up culture results Vancomycin  IV q24  Zosyn 3.375g IV q8h (infuse over 4 hours) Monitor renal function  Mickeal Skinner 11/28/2014,10:07 PM   No VTE prophylaxis as INR >2. MD to address VTE prophylaxis once INR <2.  Loura Back, Pharm.D., BCPS Clinical Pharmacist Pager: [phone] 11/29/2014 2:32 PM

## 2014-11-29 ENCOUNTER — Inpatient Hospital Stay (HOSPITAL_COMMUNITY): Payer: Medicare Other

## 2014-11-29 DIAGNOSIS — L03115 Cellulitis of right lower limb: Secondary | ICD-10-CM

## 2014-11-29 DIAGNOSIS — I96 Gangrene, not elsewhere classified: Secondary | ICD-10-CM

## 2014-11-29 LAB — COMPREHENSIVE METABOLIC PANEL
ALBUMIN: 2.4 g/dL — AB (ref 3.5–5.0)
ALT: 15 U/L (ref 14–54)
ALT: 15 U/L (ref 14–54)
AST: 25 U/L (ref 15–41)
AST: 23 U/L (ref 15–41)
Albumin: 2.6 g/dL — ABNORMAL LOW (ref 3.5–5.0)
Alkaline Phosphatase: 65 U/L (ref 38–126)
Alkaline Phosphatase: 57 U/L (ref 38–126)
Anion gap: 11 (ref 5–15)
Anion gap: 9 (ref 5–15)
BUN: 34 mg/dL — ABNORMAL HIGH (ref 6–20)
BUN: 33 mg/dL — ABNORMAL HIGH (ref 6–20)
CHLORIDE: 99 mmol/L — AB (ref 101–111)
CO2: 25 mmol/L (ref 22–32)
CO2: 24 mmol/L (ref 22–32)
CREATININE: 1.22 mg/dL — AB (ref 0.44–1.00)
Calcium: 9.2 mg/dL (ref 8.9–10.3)
Calcium: 8.8 mg/dL — ABNORMAL LOW (ref 8.9–10.3)
Chloride: 97 mmol/L — ABNORMAL LOW (ref 101–111)
Creatinine, Ser: 1.3 mg/dL — ABNORMAL HIGH (ref 0.44–1.00)
GFR calc Af Amer: 39 mL/min — ABNORMAL LOW (ref >60)
GFR calc non Af Amer: 34 mL/min — ABNORMAL LOW (ref >60)
GFR calc non Af Amer: 36 mL/min — ABNORMAL LOW (ref >60)
GFR, EST AFRICAN AMERICAN: 42 mL/min — AB (ref >60)
GLUCOSE: 98 mg/dL (ref 65–99)
Glucose, Bld: 124 mg/dL — ABNORMAL HIGH (ref 65–99)
Potassium: 4.1 mmol/L (ref 3.5–5.1)
Potassium: 3.9 mmol/L (ref 3.5–5.1)
SODIUM: 132 mmol/L — AB (ref 135–145)
Sodium: 133 mmol/L — ABNORMAL LOW (ref 135–145)
Total Bilirubin: 0.9 mg/dL (ref 0.3–1.2)
Total Bilirubin: 0.8 mg/dL (ref 0.3–1.2)
Total Protein: 6.8 g/dL (ref 6.5–8.1)
Total Protein: 6.1 g/dL — ABNORMAL LOW (ref 6.5–8.1)

## 2014-11-29 LAB — CBC WITH DIFFERENTIAL/PLATELET
BASOS ABS: 0 10*3/uL (ref 0.0–0.1)
BASOS PCT: 0 % (ref 0–1)
EOS ABS: 0 10*3/uL (ref 0.0–0.7)
EOS PCT: 0 % (ref 0–5)
HCT: 29.9 % — ABNORMAL LOW (ref 36.0–46.0)
HEMOGLOBIN: 9.5 g/dL — AB (ref 12.0–15.0)
LYMPHS ABS: 1 10*3/uL (ref 0.7–4.0)
Lymphocytes Relative: 7 % — ABNORMAL LOW (ref 12–46)
MCH: 31.1 pg (ref 26.0–34.0)
MCHC: 31.8 g/dL (ref 30.0–36.0)
MCV: 98 fL (ref 78.0–100.0)
Monocytes Absolute: 0.6 10*3/uL (ref 0.1–1.0)
Monocytes Relative: 4 % (ref 3–12)
NEUTROS PCT: 89 % — AB (ref 43–77)
Neutro Abs: 13 10*3/uL — ABNORMAL HIGH (ref 1.7–7.7)
PLATELETS: 226 10*3/uL (ref 150–400)
RBC: 3.05 MIL/uL — AB (ref 3.87–5.11)
RDW: 13.1 % (ref 11.5–15.5)
WBC: 14.6 10*3/uL — AB (ref 4.0–10.5)

## 2014-11-29 LAB — PROTIME-INR
INR: 2.71 — ABNORMAL HIGH (ref 0.00–1.49)
INR: 2.76 — ABNORMAL HIGH (ref 0.00–1.49)
PROTHROMBIN TIME: 28.8 s — AB (ref 11.6–15.2)
Prothrombin Time: 28.4 seconds — ABNORMAL HIGH (ref 11.6–15.2)

## 2014-11-29 LAB — CBC
HCT: 33.3 % — ABNORMAL LOW (ref 36.0–46.0)
Hemoglobin: 10.9 g/dL — ABNORMAL LOW (ref 12.0–15.0)
MCH: 32.6 pg (ref 26.0–34.0)
MCHC: 32.7 g/dL (ref 30.0–36.0)
MCV: 99.7 fL (ref 78.0–100.0)
Platelets: 234 10*3/uL (ref 150–400)
RBC: 3.34 MIL/uL — ABNORMAL LOW (ref 3.87–5.11)
RDW: 13.3 % (ref 11.5–15.5)
WBC: 17.4 10*3/uL — ABNORMAL HIGH (ref 4.0–10.5)

## 2014-11-29 LAB — PROCALCITONIN: PROCALCITONIN: 1.6 ng/mL

## 2014-11-29 LAB — APTT: APTT: 34 s (ref 24–37)

## 2014-11-29 LAB — MRSA PCR SCREENING: MRSA by PCR: NEGATIVE

## 2014-11-29 LAB — LACTIC ACID, PLASMA: Lactic Acid, Venous: 1.3 mmol/L (ref 0.5–2.0)

## 2014-11-29 MED ORDER — HYDROCODONE-ACETAMINOPHEN 5-325 MG PO TABS
1.0000 | ORAL_TABLET | ORAL | Status: DC | PRN
  Administered 2014-11-29: 1 via ORAL
  Filled 2014-11-29: qty 1

## 2014-11-29 MED ORDER — ACETAMINOPHEN 650 MG RE SUPP: 650.0000 mg | RECTAL | Status: DC | PRN

## 2014-11-29 MED ORDER — SODIUM CHLORIDE 0.9 % IV BOLUS (SEPSIS)
1000.0000 mL | INTRAVENOUS | Status: AC
  Administered 2014-11-29: 1000 mL via INTRAVENOUS

## 2014-11-29 MED ORDER — HYDROCODONE-ACETAMINOPHEN 5-325 MG PO TABS
2.0000 | ORAL_TABLET | ORAL | Status: DC | PRN
  Administered 2014-11-29 – 2014-11-30 (×4): 2 via ORAL
  Filled 2014-11-29 (×4): qty 2

## 2014-11-29 MED ORDER — SACCHAROMYCES BOULARDII 250 MG PO CAPS
250.0000 mg | ORAL_CAPSULE | Freq: Two times a day (BID) | ORAL | Status: DC
  Administered 2014-11-29 – 2014-12-08 (×18): 250 mg via ORAL
  Filled 2014-11-29 (×24): qty 1

## 2014-11-29 MED ORDER — SODIUM CHLORIDE 0.9 % IV SOLN: INTRAVENOUS | Status: DC

## 2014-11-29 MED ORDER — MORPHINE SULFATE (PF) 2 MG/ML IV SOLN
2.0000 mg | INTRAVENOUS | Status: DC | PRN
  Administered 2014-11-29 – 2014-12-01 (×4): 2 mg via INTRAVENOUS
  Filled 2014-11-29 (×4): qty 1

## 2014-11-29 MED ORDER — DEXTROSE 5 % IV SOLN
2.0000 g | INTRAVENOUS | Status: DC
  Administered 2014-11-29 – 2014-12-05 (×7): 2 g via INTRAVENOUS
  Filled 2014-11-29 (×8): qty 2

## 2014-11-29 NOTE — Progress Notes (Signed)
Foley catheter inserted with assistance from Delaney Meigs, RN per MD order. Pt tolerated well. Urine flow returned, yellow with sediment. Pt is resting. No new complaints at this time. Will continue to monitor.  Daemian Gahm, RN

## 2014-11-29 NOTE — Consult Note (Signed)
Reason for Consult: Dry gangrene right great toe Referring Physician: Dr. Abbey Chatters is an 81 y.o. female.  HPI: Patient is a 81 year old woman who by report of the family has had ischemic changes of the right great toe for over 3 months. During patient's skilled nursing care the ischemic changes has progressed to dry gangrene.  Past Medical History  Diagnosis Date  . Hyperlipidemia   . Osteoporosis   . Hypertension     with severe left ventricular hypertrophy; normal ejection fraction in 04/2005  . Valvular heart disease     Mild to moderate aortic stenosis; moderate to severe mitral stenosis in 2007; mild MR  . Paroxysmal atrial fibrillation     Remote  . Diverticulosis     Pancolonic; h/o LGI bleeding and diverticulitis  . Degenerative joint disease     Knees  . Chronic kidney disease     Mild; creatinine of 1.19-1.28 in recent years  . Anemia, iron deficiency   . Popliteal cyst     Left  . Fracture of wrist 2008    left  . Gout   . Stroke right side deficit  . NSTEMI, initial episode of care 01/05/2012  . Chronic combined systolic and diastolic congestive heart failure 02/2013    EF 37%; Grade 2 diastolic dys    Past Surgical History  Procedure Laterality Date  . Appendectomy  1944  . Total hip arthroplasty  1994    Family History  Problem Relation Age of Onset  . Diabetes Mother     Social History:  reports that she quit smoking about 42 years ago. Her smoking use included Cigarettes. She has never used smokeless tobacco. She reports that she does not drink alcohol or use illicit drugs.  Allergies: No Known Allergies  Medications: I have reviewed the patient's current medications.  Results for orders placed or performed during the hospital encounter of 11/28/14 (from the past 48 hour(s))  CBC with Differential/Platelet     Status: Abnormal   Collection Time: 11/28/14  6:34 PM  Result Value Ref Range   WBC 11.0 (H) 4.0 - 10.5 K/uL   RBC 3.16  (L) 3.87 - 5.11 MIL/uL   Hemoglobin 10.6 (L) 12.0 - 15.0 g/dL   HCT 31.8 (L) 36.0 - 46.0 %   MCV 100.6 (H) 78.0 - 100.0 fL   MCH 33.5 26.0 - 34.0 pg   MCHC 33.3 30.0 - 36.0 g/dL   RDW 13.0 11.5 - 15.5 %   Platelets 228 150 - 400 K/uL   Neutrophils Relative % 79 (H) 43 - 77 %   Neutro Abs 8.7 (H) 1.7 - 7.7 K/uL   Lymphocytes Relative 12 12 - 46 %   Lymphs Abs 1.3 0.7 - 4.0 K/uL   Monocytes Relative 8 3 - 12 %   Monocytes Absolute 0.8 0.1 - 1.0 K/uL   Eosinophils Relative 1 0 - 5 %   Eosinophils Absolute 0.1 0.0 - 0.7 K/uL   Basophils Relative 0 0 - 1 %   Basophils Absolute 0.0 0.0 - 0.1 K/uL  Comprehensive metabolic panel     Status: Abnormal   Collection Time: 11/28/14  6:34 PM  Result Value Ref Range   Sodium 133 (L) 135 - 145 mmol/L   Potassium 5.4 (H) 3.5 - 5.1 mmol/L   Chloride 97 (L) 101 - 111 mmol/L   CO2 24 22 - 32 mmol/L   Glucose, Bld 106 (H) 65 - 99 mg/dL   BUN  37 (H) 6 - 20 mg/dL   Creatinine, Ser 1.28 (H) 0.44 - 1.00 mg/dL   Calcium 9.5 8.9 - 10.3 mg/dL   Total Protein 7.0 6.5 - 8.1 g/dL   Albumin 2.8 (L) 3.5 - 5.0 g/dL   AST 29 15 - 41 U/L   ALT 16 14 - 54 U/L   Alkaline Phosphatase 65 38 - 126 U/L   Total Bilirubin 0.9 0.3 - 1.2 mg/dL   GFR calc non Af Amer 34 (L) >60 mL/min   GFR calc Af Amer 40 (L) >60 mL/min    Comment: (NOTE) The eGFR has been calculated using the CKD EPI equation. This calculation has not been validated in all clinical situations. eGFR's persistently <60 mL/min signify possible Chronic Kidney Disease.    Anion gap 12 5 - 15  I-Stat CG4 Lactic Acid, ED     Status: Abnormal   Collection Time: 11/28/14  6:42 PM  Result Value Ref Range   Lactic Acid, Venous 2.20 (HH) 0.5 - 2.0 mmol/L   Comment NOTIFIED PHYSICIAN   Protime-INR     Status: Abnormal   Collection Time: 11/28/14  7:40 PM  Result Value Ref Range   Prothrombin Time 27.5 (H) 11.6 - 15.2 seconds   INR 2.60 (H) 0.00 - 1.49  I-stat chem 8, ed     Status: Abnormal    Collection Time: 11/28/14  7:44 PM  Result Value Ref Range   Sodium 135 135 - 145 mmol/L   Potassium 4.7 3.5 - 5.1 mmol/L   Chloride 98 (L) 101 - 111 mmol/L   BUN 53 (H) 6 - 20 mg/dL   Creatinine, Ser 1.30 (H) 0.44 - 1.00 mg/dL   Glucose, Bld 104 (H) 65 - 99 mg/dL   Calcium, Ion 1.15 1.13 - 1.30 mmol/L   TCO2 25 0 - 100 mmol/L   Hemoglobin 15.3 (H) 12.0 - 15.0 g/dL   HCT 45.0 36.0 - 46.0 %  I-Stat CG4 Lactic Acid, ED     Status: Abnormal   Collection Time: 11/28/14  9:57 PM  Result Value Ref Range   Lactic Acid, Venous 3.27 (HH) 0.5 - 2.0 mmol/L   Comment NOTIFIED PHYSICIAN   MRSA PCR Screening     Status: None   Collection Time: 11/29/14 12:16 AM  Result Value Ref Range   MRSA by PCR NEGATIVE NEGATIVE    Comment:        The GeneXpert MRSA Assay (FDA approved for NASAL specimens only), is one component of a comprehensive MRSA colonization surveillance program. It is not intended to diagnose MRSA infection nor to guide or monitor treatment for MRSA infections.   Protime-INR     Status: Abnormal   Collection Time: 11/29/14  3:35 AM  Result Value Ref Range   Prothrombin Time 28.4 (H) 11.6 - 15.2 seconds   INR 2.71 (H) 0.00 - 1.49  CBC     Status: Abnormal   Collection Time: 11/29/14  3:35 AM  Result Value Ref Range   WBC 17.4 (H) 4.0 - 10.5 K/uL   RBC 3.34 (L) 3.87 - 5.11 MIL/uL   Hemoglobin 10.9 (L) 12.0 - 15.0 g/dL   HCT 33.3 (L) 36.0 - 46.0 %   MCV 99.7 78.0 - 100.0 fL   MCH 32.6 26.0 - 34.0 pg   MCHC 32.7 30.0 - 36.0 g/dL   RDW 13.3 11.5 - 15.5 %   Platelets 234 150 - 400 K/uL  Comprehensive metabolic panel     Status: Abnormal  Collection Time: 11/29/14  3:35 AM  Result Value Ref Range   Sodium 133 (L) 135 - 145 mmol/L   Potassium 4.1 3.5 - 5.1 mmol/L   Chloride 97 (L) 101 - 111 mmol/L   CO2 25 22 - 32 mmol/L   Glucose, Bld 124 (H) 65 - 99 mg/dL   BUN 34 (H) 6 - 20 mg/dL   Creatinine, Ser 1.30 (H) 0.44 - 1.00 mg/dL   Calcium 9.2 8.9 - 10.3 mg/dL   Total  Protein 6.8 6.5 - 8.1 g/dL   Albumin 2.6 (L) 3.5 - 5.0 g/dL   AST 25 15 - 41 U/L   ALT 15 14 - 54 U/L   Alkaline Phosphatase 65 38 - 126 U/L   Total Bilirubin 0.9 0.3 - 1.2 mg/dL   GFR calc non Af Amer 34 (L) >60 mL/min   GFR calc Af Amer 39 (L) >60 mL/min    Comment: (NOTE) The eGFR has been calculated using the CKD EPI equation. This calculation has not been validated in all clinical situations. eGFR's persistently <60 mL/min signify possible Chronic Kidney Disease.    Anion gap 11 5 - 15    Dg Chest Port 1 View  11/29/2014   CLINICAL DATA:  Sepsis. Possible necrotizing fasciitis involving the right great toe. Possible humerus fracture.  EXAM: PORTABLE CHEST - 1 VIEW  COMPARISON:  Right humerus radiographs -11/02/2014; chest radiograph - 10/25/2014; 02/11/2014; 02/03/2014  FINDINGS: Grossly unchanged enlarged cardiac silhouette and mediastinal contours with atherosclerotic plaque within the thoracic aorta. Unchanged retrocardiac opacity favored to represent a hiatal hernia. Mild pulmonary venous congestion without frank evidence of edema. No focal airspace opacities. No pleural effusion or pneumothorax. Unchanged bones including deformity involving the right humeral head as better depicted on right shoulder radiographs performed 11/02/2014  IMPRESSION: 1. Cardiomegaly without acute cardiopulmonary disease. 2. Suspected hiatal hernia. 3. Persistent deformity involving the right humeral head as better depicted on right shoulder radiographs performed 11/02/2014   Electronically Signed   By: Sandi Mariscal M.D.   On: 11/29/2014 09:13   Dg Foot Complete Right  11/28/2014   CLINICAL DATA:  Right toe erythema and discoloration, with pain. Initial encounter.  EXAM: RIGHT FOOT COMPLETE - 3+ VIEW  COMPARISON:  None.  FINDINGS: Diffuse soft tissue air is noted tracking about the great toe, concerning for necrotizing fasciitis. Underlying lucency within the first distal phalanx raises concern for  osteomyelitis. There may be osteomyelitis involving the distal aspect of the first proximal phalanx, though this is difficult to fully characterize on radiograph.  Diffuse vascular calcifications are seen. Visualized joint spaces are grossly unremarkable.  IMPRESSION: 1. Diffuse soft tissue air tracks about the great toe, concerning for necrotizing fasciitis. Underlying lucency within the first distal phalanx raises concern for osteomyelitis. There may be osteomyelitis involving the distal aspect of the first proximal phalanx, though this is difficult to fully characterize on radiograph. 2. Diffuse vascular calcifications seen.  These results were called by telephone at the time of interpretation on 11/28/2014 at 7:02 pm to Dr. Milton Ferguson, who verbally acknowledged these results.   Electronically Signed   By: Garald Balding M.D.   On: 11/28/2014 19:06    Review of Systems  All other systems reviewed and are negative.  Blood pressure 90/51, pulse 97, temperature 98.7 F (37.1 C), temperature source Oral, resp. rate 16, weight 58.7 kg (129 lb 6.6 oz), SpO2 100 %. Physical Exam On examination patient has no gangrenous ulcers to the left lower  extremity. Right lower extremity is tender to palpation around the ankle and heel. She has dry gangrene of the great toe there is no cellulitis no drainage or signs of infection. Patient does not have a palpable pulse. Assessment/Plan: Assessment ischemic changes right leg with dry gangrene of the right great toe.  Plan: I have ordered an ankle-brachial indices. Recommend vascular surgery evaluation. I doubt patient would heal a first ray amputation. If patient is not a revascularization candidate she most likely would require a transtibial amputation. Would plan for surgery once vascular has evaluated the patient and once ankle-brachial indices have been obtained. I canceled patient's nothing by mouth status.  , V 11/29/2014, 10:24 AM

## 2014-11-29 NOTE — Progress Notes (Addendum)
[facility] PROGRESS NOTE Assessment/Plan: Sepsis due to cellulitis with Gangrene of foot: - Orthopedic surgery consulted by the emergency room physician. - Started empirically on vancomycin and Zosyn, white blood cell is rising, she continues to have fever. DC Zosyn and start Rocephin.Start florastor. - I will go ahead and start her on the sepsis pathway, give her 2 L of normal saline continuous cycle lactic acid, monitors act strict I's and O's. - Blood pressure seems to be stable. - Check ABI's.  Preop Evaluation: - Due to age and multiple comorbidities, chronic kidney disease atrophic fibrillation on Coumadin, chest x-rays pending. She is high risk for cardiopulmonary complications. Family and patient understand and they would like to proceed with surgery if it's required. - I have explained risk and benefits to the family and they're still thinking about their decision. - Surgery could be done under local block it would be preferred.  Long term current use of anticoagulant therapy/  Paroxysmal atrial fibrillation: - Rate control, was given vitamin K INR continues to be high. Risk and benefits of reversing her INR work spine to the family. - Recheck INR in the afternoon. - CHADS VASC2 score is 6.  Chronic kidney disease stage 3-4: Baseline creatinine 1.2-1.3 Continue to monitor.  Essential hypertension: Continue metoprolol, continue to hold Ace.  Severe protein caloric malnutrition: Ensure 3 times a day after surgery.   Code Status:DNR/DNI Family Communication: daughter  Disposition Plan: inpatient   Consultants:  Ortho  Procedures:  CXR  Right foot x-ray  Antibiotics:  vanc and rocephin  HPI/Subjective: Continues to be in pain.  Objective: Filed Vitals:   11/28/14 2100 11/28/14 2237 11/28/14 2240 11/29/14 0450  BP: 108/69 121/83  102/50  Pulse: 41 100  92  Temp:  101.2 F (38.4 C)  98.3 F (36.8 C)  TempSrc:  Axillary  Oral  Resp: Weight:  58.7 kg (129 lb 6.6 oz)    SpO2: 100% 85% 97% 100%    Intake/Output Summary (Last 24 hours) at 11/29/14 0918 Last data filed at 11/28/14 2147  Gross per 24 hour  Intake    250 ml  Output      0 ml  Net    250 ml   Filed Weights   11/28/14 2237  Weight: 58.7 kg (129 lb 6.6 oz)    Exam:  General: Alert, awake, oriented x3, in no acute distress. Cachectic. HEENT: No bruits, no goiter.  Heart: Regular rate and rhythm. Lungs: Good air movement, clear Abdomen: Soft, nontender, nondistended, positive bowel sounds.  Neuro: Grossly intact, nonfocal.   Data Reviewed: Basic Metabolic Panel:  Recent Labs Lab 11/28/14 1834 11/28/14 1944 11/29/14 0335  NA 133* 135 133*  K 5.4* 4.7 4.1  CL 97* 98* 97*  CO2 24  --  25  GLUCOSE 106* 104* 124*  BUN 37* 53* 34*  CREATININE 1.28* 1.30* 1.30*  CALCIUM 9.5  --  9.2   Liver Function Tests:  Recent Labs Lab 11/28/14 1834 11/29/14 0335  AST 29 25  ALT 16 15  ALKPHOS 65 65  BILITOT 0.9 0.9  PROT 7.0 6.8  ALBUMIN 2.8* 2.6*   No results for input(s): LIPASE, AMYLASE in the last 168 hours. No results for input(s): AMMONIA in the last 168 hours. CBC:  Recent Labs Lab 11/28/14 1834 11/28/14 1944 11/29/14 0335  WBC 11.0*  --  17.4*  NEUTROABS 8.7*  --   --   HGB 10.6* 15.3* 10.9*  HCT 31.8* 45.0 33.3*  MCV 100.6*  --  99.7  PLT 228  --  234   Cardiac Enzymes: No results for input(s): CKTOTAL, CKMB, CKMBINDEX, TROPONINI in the last 168 hours. BNP (last 3 results) No results for input(s): BNP in the last 8760 hours.  ProBNP (last 3 results) No results for input(s): PROBNP in the last 8760 hours.  CBG: No results for input(s): GLUCAP in the last 168 hours.  Recent Results (from the past 240 hour(s))  MRSA PCR Screening     Status: None   Collection Time: 11/29/14 12:16 AM  Result Value Ref Range Status   MRSA by PCR NEGATIVE NEGATIVE Final    Comment:        The GeneXpert MRSA Assay (FDA approved for  NASAL specimens only), is one component of a comprehensive MRSA colonization surveillance program. It is not intended to diagnose MRSA infection nor to guide or monitor treatment for MRSA infections.      Studies: Dg Chest Port 1 View  11/29/2014   CLINICAL DATA:  Sepsis. Possible necrotizing fasciitis involving the right great toe. Possible humerus fracture.  EXAM: PORTABLE CHEST - 1 VIEW  COMPARISON:  Right humerus radiographs -11/02/2014; chest radiograph - 10/25/2014; 02/11/2014; 02/03/2014  FINDINGS: Grossly unchanged enlarged cardiac silhouette and mediastinal contours with atherosclerotic plaque within the thoracic aorta. Unchanged retrocardiac opacity favored to represent a hiatal hernia. Mild pulmonary venous congestion without frank evidence of edema. No focal airspace opacities. No pleural effusion or pneumothorax. Unchanged bones including deformity involving the right humeral head as better depicted on right shoulder radiographs performed 11/02/2014  IMPRESSION: 1. Cardiomegaly without acute cardiopulmonary disease. 2. Suspected hiatal hernia. 3. Persistent deformity involving the right humeral head as better depicted on right shoulder radiographs performed 11/02/2014   Electronically Signed   By: Simonne Come M.D.   On: 11/29/2014 09:13   Dg Foot Complete Right  11/28/2014   CLINICAL DATA:  Right toe erythema and discoloration, with pain. Initial encounter.  EXAM: RIGHT FOOT COMPLETE - 3+ VIEW  COMPARISON:  None.  FINDINGS: Diffuse soft tissue air is noted tracking about the great toe, concerning for necrotizing fasciitis. Underlying lucency within the first distal phalanx raises concern for osteomyelitis. There may be osteomyelitis involving the distal aspect of the first proximal phalanx, though this is difficult to fully characterize on radiograph.  Diffuse vascular calcifications are seen. Visualized joint spaces are grossly unremarkable.  IMPRESSION: 1. Diffuse soft tissue air tracks  about the great toe, concerning for necrotizing fasciitis. Underlying lucency within the first distal phalanx raises concern for osteomyelitis. There may be osteomyelitis involving the distal aspect of the first proximal phalanx, though this is difficult to fully characterize on radiograph. 2. Diffuse vascular calcifications seen.  These results were called by telephone at the time of interpretation on 11/28/2014 at 7:02 pm to Dr. Bethann Berkshire, who verbally acknowledged these results.   Electronically Signed   By: Roanna Raider M.D.   On: 11/28/2014 19:06    Scheduled Meds: . atorvastatin  10 mg Oral q1800  . metoprolol  50 mg Oral BID  . saccharomyces boulardii  250 mg Oral BID  . sodium chloride  1,000 mL Intravenous Q1H  . vancomycin  750 mg Intravenous Q24H   Continuous Infusions: . sodium chloride 75 mL/hr at 11/28/14 2247    Time Spent:  25 min   Marinda Elk  [facility] Pager [phone].  If 7PM-7AM, please contact night-coverage at [email or web address], password [facility]  11/29/2014, 9:18 AM  LOS: 1 day

## 2014-11-29 NOTE — Consult Note (Signed)
[facility] [place]  Patient name: Judy Grant MRN: [id number] DOB: 1917/07/28 Sex: female  REASON FOR CONSULT: Dry gangrene of right great toe with PVD. Consult is from [facility]  HPI: Judy Grant is a 81 y.o. female who was admitted last night with dry gangrene of the right great toe. She is a poor historian and currently there is no family available. She apparently lives in a nursing home and was sent to the emergency department because of worsening of her right great toe with black discoloration. She was complaining of pain in the toe. There is no history of claudication although the patient's activity is very limited. She denies rest pain in the feet but has had pain in the right great toe. She does not remember any injury to the right great toe.  Her risk factors for peripheral vascular disease include hyperlipidemia and hypertension. She denies any history of diabetes, family history of premature cardiovascular disease, or history of tobacco use.  Past Medical History  Diagnosis Date  . Hyperlipidemia   . Osteoporosis   . Hypertension     with severe left ventricular hypertrophy; normal ejection fraction in 04/2005  . Valvular heart disease     Mild to moderate aortic stenosis; moderate to severe mitral stenosis in 2007; mild MR  . Paroxysmal atrial fibrillation     Remote  . Diverticulosis     Pancolonic; h/o LGI bleeding and diverticulitis  . Degenerative joint disease     Knees  . Chronic kidney disease     Mild; creatinine of 1.19-1.28 in recent years  . Anemia, iron deficiency   . Popliteal cyst     Left  . Fracture of wrist 2008    left  . Gout   . Stroke right side deficit  . NSTEMI, initial episode of care 01/05/2012  . Chronic combined systolic and diastolic congestive heart failure 02/2013    EF 30%; Grade 2 diastolic dys    Family History  Problem Relation Age of Onset  . Diabetes Mother     SOCIAL  HISTORY: Social History  Substance Use Topics  . Smoking status: Former Smoker    Types: Cigarettes    Quit date: 08/31/1972  . Smokeless tobacco: Never Used  . Alcohol Use: No    No Known Allergies  Current Facility-Administered Medications  Medication Dose Route Frequency Provider Last Rate Last Dose  . 0.9 %  sodium chloride infusion   Intravenous Continuous Marinda Elk, MD      . atorvastatin (LIPITOR) tablet 10 mg  10 mg Oral q1800 Meredeth Ide, MD      . cefTRIAXone (ROCEPHIN) 2 g in dextrose 5 % 50 mL IVPB  2 g Intravenous Q24H Marinda Elk, MD   2 g at 11/29/14 1211  . HYDROcodone-acetaminophen (NORCO/VICODIN) 5-325 MG per tablet 2 tablet  2 tablet Oral Q4H PRN Marinda Elk, MD      . metoprolol (LOPRESSOR) tablet 50 mg  50 mg Oral BID Meredeth Ide, MD   50 mg at 11/28/14 2343  . morphine 2 MG/ML injection 2 mg  2 mg Intravenous Q2H PRN Marinda Elk, MD   2 mg at 11/29/14 4098  . ondansetron (ZOFRAN) tablet 4 mg  4 mg Oral Q6H PRN Meredeth Ide, MD       Or  . ondansetron (ZOFRAN) injection 4 mg  4 mg Intravenous Q6H PRN Meredeth Ide, MD      .  saccharomyces boulardii (FLORASTOR) capsule 250 mg  250 mg Oral BID Marinda Elk, MD      . vancomycin (VANCOCIN) IVPB 750 mg/150 ml premix  750 mg Intravenous Q24H Meredeth Ide, MD        REVIEW OF SYSTEMS: Arly.Keller ] denotes positive finding; [  ] denotes negative finding CARDIOVASCULAR:  [ ]  chest pain   [ ]  chest pressure   [ ]  palpitations   [ ]  orthopnea   Arly.Keller ] dyspnea on exertion   [ ]  claudication   [ ]  rest pain   [ ]  DVT   [ ]  phlebitis PULMONARY:   [ ]  productive cough   [ ]  asthma   [ ]  wheezing NEUROLOGIC:   Arly.Keller ] weakness  [ ]  paresthesias  [ ]  aphasia  [ ]  amaurosis  [ ]  dizziness HEMATOLOGIC:   [ ]  bleeding problems   [ ]  clotting disorders MUSCULOSKELETAL:  Arly.Keller ] joint pain   [ ]  joint swelling [ ]  leg swelling GASTROINTESTINAL: [ ]   blood in stool  [ ]   hematemesis GENITOURINARY:  [ ]    dysuria  [ ]   hematuria PSYCHIATRIC:  [ ]  history of major depression INTEGUMENTARY:  [ ]  rashes  Arly.Keller ] ulcers CONSTITUTIONAL:  [ ]  fever   [ ]  chills  PHYSICAL EXAM: Filed Vitals:   11/29/14 1021 11/29/14 1100 11/29/14 1300 11/29/14 1317  BP: 90/51 96/50 82/38  86/30  Pulse: 97 98    Temp:      TempSrc:      Resp: 16     Weight:      SpO2: 100% 100%     Body mass index is 20.9 kg/(m^2). GENERAL: The patient is a well-nourished female, in no acute distress. The vital signs are documented above. CARDIAC: There is an irregular rhythm. She has a systolic murmur. VASCULAR: I do not detect carotid bruits. On the right side, she has a palpable femoral pulse. I cannot palpate a popliteal or pedal pulses. She has a markedly dampened monophasic dorsalis pedis and peroneal signal with Doppler with a monophasic posterior tibial signal. On the left side, she has a palpable femoral pulse. I cannot palpate popliteal or pedal pulses. She has a monophasic posterior tibial signal with the Doppler. I could not obtain a dorsalis pedis signal on the left. PULMONARY: There is good air exchange bilaterally without wheezing or rales. ABDOMEN: Soft and non-tender with normal pitched bowel sounds.  NEUROLOGIC: No focal weakness or paresthesias are detected. SKIN: she has dry gangrene of the right great toe. There is some cellulitis proximal to this and there is an odor to the toe. PSYCHIATRIC: The patient has a normal affect.  DATA:  Lab Results  Component Value Date   WBC 14.6* 11/29/2014   HGB 9.5* 11/29/2014   HCT 29.9* 11/29/2014   MCV 98.0 11/29/2014   PLT 226 11/29/2014   Lab Results  Component Value Date   NA 132* 11/29/2014   K 3.9 11/29/2014   CL 99* 11/29/2014   CO2 24 11/29/2014   Lab Results  Component Value Date   CREATININE 1.22* 11/29/2014   Lab Results  Component Value Date   INR 2.76* 11/29/2014   INR 2.71* 11/29/2014   INR 2.60* 11/28/2014   Lab Results  Component Value  Date   HGBA1C 5.9* 01/06/2012   BLOOD CULTURE: Blood cultures are pending.  X-RAY RIGHT FOOT: This shows possible osteomyelitis at the first distal phalanx and proximal phalanx she  has diffuse vascular calcification also.  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 She is currently being treated with Rocephin and vancomycin IV. Based on her exam she has evidence of infrainguinal arterial occlusive disease. Her noninvasive studies are pending and these would help predict her chance of healing a toe amputation. However, based on her exam I think it is unlikely that she would heal a toe amputation and would more likely require a more proximal amputation (BKA versus AKA).     This patient is not a candidate for a bypass given her advanced age, aortic stenosis, history of congestive heart failure, history of previous non-ST MI (2013). However, if the family wished to consider an aggressive approach to limb salvage, consideration could be given to obtaining a CT angiogram to further evaluate her infrainguinal arterial occlusive disease. Her GFR is 42 with a creatinine of 1.22. If she had diffuse disease, then this could help predict the level of amputation (BKA versus AKA). If she had focal disease that might be amenable to angioplasty, then consideration could be given to arteriography and angioplasty and stenting.  I will follow up on her vascular studies and tried to discuss this with her family when they are available. In the meantime, her toe is being followed by orthopedics and I believe that her Coumadin is being held in anticipation of possible surgery.   * ATRIAL FIBRILLATION: She is on Coumadin for atrial fibrillation and her INR is therapeutic at 2.76.   Waverly Ferrari [facility]  [place] Beeper: [phone]

## 2014-11-29 NOTE — Progress Notes (Signed)
Family concerned that patient is in pain.  Family does not want morphine given.  Family wanting to know when MD will see patient. Patient is NPO and not able to give any medication at this time.  Family will await MD. Pt resting with call bell within reach.  Will continue to monitor. Thomas Hoff, RN

## 2014-11-30 ENCOUNTER — Inpatient Hospital Stay (HOSPITAL_COMMUNITY): Payer: Medicare Other

## 2014-11-30 DIAGNOSIS — L03116 Cellulitis of left lower limb: Secondary | ICD-10-CM

## 2014-11-30 DIAGNOSIS — E1152 Type 2 diabetes mellitus with diabetic peripheral angiopathy with gangrene: Secondary | ICD-10-CM

## 2014-11-30 LAB — PROTIME-INR
INR: 1.84 — ABNORMAL HIGH (ref 0.00–1.49)
PROTHROMBIN TIME: 21.2 s — AB (ref 11.6–15.2)

## 2014-11-30 LAB — HEPARIN LEVEL (UNFRACTIONATED): HEPARIN UNFRACTIONATED: 0.16 [IU]/mL — AB (ref 0.30–0.70)

## 2014-11-30 MED ORDER — SODIUM CHLORIDE 0.9 % IV SOLN
INTRAVENOUS | Status: DC
Start: 2014-11-30 — End: 2014-12-01

## 2014-11-30 MED ORDER — IOHEXOL 350 MG/ML SOLN
80.0000 mL | Freq: Once | INTRAVENOUS | Status: AC | PRN
  Administered 2014-11-30: 80 mL via INTRAVENOUS

## 2014-11-30 MED ORDER — HEPARIN (PORCINE) IN NACL 100-0.45 UNIT/ML-% IJ SOLN
800.0000 [IU]/h | INTRAMUSCULAR | Status: DC
  Administered 2014-11-30: 800 [IU]/h via INTRAVENOUS
  Administered 2014-12-01: 850 [IU]/h via INTRAVENOUS
  Filled 2014-11-30 (×6): qty 250

## 2014-11-30 MED ORDER — OXYCODONE-ACETAMINOPHEN 5-325 MG PO TABS
1.0000 | ORAL_TABLET | ORAL | Status: DC | PRN
  Administered 2014-11-30 (×2): 1 via ORAL
  Administered 2014-11-30 – 2014-12-01 (×3): 2 via ORAL
  Administered 2014-12-02: 1 via ORAL
  Administered 2014-12-02: 2 via ORAL
  Administered 2014-12-03 (×3): 1 via ORAL
  Administered 2014-12-07: 2 via ORAL
  Administered 2014-12-07: 1 via ORAL
  Administered 2014-12-07: 2 via ORAL
  Administered 2014-12-08 (×2): 1 via ORAL
  Filled 2014-11-30: qty 1
  Filled 2014-11-30: qty 2
  Filled 2014-11-30: qty 1
  Filled 2014-11-30: qty 2
  Filled 2014-11-30 (×4): qty 1
  Filled 2014-11-30: qty 2
  Filled 2014-11-30: qty 1
  Filled 2014-11-30: qty 2
  Filled 2014-11-30: qty 1
  Filled 2014-11-30: qty 2
  Filled 2014-11-30 (×2): qty 1
  Filled 2014-11-30 (×2): qty 2
  Filled 2014-11-30: qty 1

## 2014-11-30 MED ORDER — POLYETHYLENE GLYCOL 3350 17 G PO PACK
17.0000 g | PACK | Freq: Every day | ORAL | Status: DC
  Administered 2014-11-30 – 2014-12-08 (×7): 17 g via ORAL
  Filled 2014-11-30 (×10): qty 1

## 2014-11-30 MED ORDER — HALOPERIDOL LACTATE 5 MG/ML IJ SOLN
1.0000 mg | Freq: Four times a day (QID) | INTRAMUSCULAR | Status: DC | PRN
  Filled 2014-11-30: qty 0.2

## 2014-11-30 MED ORDER — HEPARIN SODIUM (PORCINE) 5000 UNIT/ML IJ SOLN
5000.0000 [IU] | Freq: Three times a day (TID) | INTRAMUSCULAR | Status: DC
  Administered 2014-11-30: 5000 [IU] via SUBCUTANEOUS
  Filled 2014-11-30: qty 1

## 2014-11-30 NOTE — Progress Notes (Signed)
   VASCULAR SURGERY ASSESSMENT & PLAN:  * ABIs still pending. I have had a long discussion with the patient and her daughter. We have agreed to proceed with a CT angiogram of the right lower extremity. If she has diffuse infrainguinal arterial occlusive disease, I do not think that she is a candidate for revascularization given her age, aortic stenosis, history of congestive heart failure, history of previous myocardial infarction, and debilitated state. However, if she had focal disease amenable to angioplasty then this could be considered. Depending upon the extent of disease, if there were no options for revascularization, the options would be to attempt to amputation versus proceeding with primary below the knee or above-knee amputation. I will make further recommendations pending the results of her CT angiogram.  SUBJECTIVE: Comfortable  PHYSICAL EXAM: Filed Vitals:   11/29/14 1700 11/29/14 1921 11/29/14 2103 11/30/14 0440  BP:  101/63 97/58 111/61  Pulse: 80 103 88 110  Temp:  98.4 F (36.9 C)  97.5 F (36.4 C)  TempSrc:  Oral  Oral  Resp:  16  18  Weight:      SpO2:  100%  100%   Dry gangrene of right great toe unchanged.  No drainage. Minimal cellulitis.   LABS: Lab Results  Component Value Date   WBC 14.6* 11/29/2014   HGB 9.5* 11/29/2014   HCT 29.9* 11/29/2014   MCV 98.0 11/29/2014   PLT 226 11/29/2014   Lab Results  Component Value Date   CREATININE 1.22* 11/29/2014   Lab Results  Component Value Date   INR 1.84* 11/30/2014   CBG (last 3)  No results for input(s): GLUCAP in the last 72 hours.  Active Problems:   Paroxysmal atrial fibrillation   Chronic kidney disease   Long term current use of anticoagulant therapy   Gangrene of foot   Cellulitis of right foot   Cari Caraway Beeper: [phone] 11/30/2014

## 2014-11-30 NOTE — Progress Notes (Signed)
ANTICOAGULATION CONSULT NOTE  Pharmacy Consult for Heparin Indication: atrial fibrillation  No Known Allergies  Patient Measurements: Weight: 129 lb 6.6 oz (58.7 kg)  Vital Signs: Temp: 98.1 F (36.7 C) (09/04 1934) Temp Source: Oral (09/04 1934) BP: 122/66 mmHg (09/04 1934) Pulse Rate: 91 (09/04 1934)  Labs:  Recent Labs  11/28/14 1834  11/28/14 1944 11/29/14 0335 11/29/14 1116 11/29/14 1154 11/30/14 0543 11/30/14 2306  HGB 10.6*  --  15.3* 10.9*  --  9.5*  --   --   HCT 31.8*  --  45.0 33.3*  --  29.9*  --   --   PLT 228  --   --  234  --  226  --   --   APTT  --   --   --   --  34  --   --   --   LABPROT  --   < >  --  28.4* 28.8*  --  21.2*  --   INR  --   < >  --  2.71* 2.76*  --  1.84*  --   HEPARINUNFRC  --   --   --   --   --   --   --  0.16*  CREATININE 1.28*  --  1.30* 1.30* 1.22*  --   --   --   < > = values in this interval not displayed.  Estimated Creatinine Clearance: 25.6 mL/min (by C-G formula based on Cr of 1.22).  Assessment: 81 y.o. female with h/o Afib, Coumadin on hold, for heparin Goal of Therapy:  Heparin level 0.3-0.7 units/ml Monitor platelets by anticoagulation protocol: Yes   Plan:  Increase Heparin 1000 units/hr Check heparin level in 8 hours.   Eddie Candle 11/30/2014,11:40 PM

## 2014-11-30 NOTE — Progress Notes (Signed)
VASCULAR LAB PRELIMINARY  ARTERIAL  ABI completed:    RIGHT    LEFT    PRESSURE WAVEFORM  PRESSURE WAVEFORM  BRACHIAL Not obtained due to fracture arm  BRACHIAL 96 Triphasic  DP  Not found DP  Not found  AT 59 Dampened Monophasic AT 67 Dampened monophasic  PT 83 Dampened monophasic PT 59 Dampened moophasic                  RIGHT LEFT  ABI 0.86 0.70   Bilateral ABIs indicate a mild reduction in arterial flow. Abnormal Doppler waveforms may suggest a false elevation of pressures .  Chrisotpher Rivero, RVS 11/30/2014, 11:58 AM

## 2014-11-30 NOTE — Progress Notes (Signed)
[facility] PROGRESS NOTE Assessment/Plan: Sepsis due to cellulitis with Gangrene of foot: - Started empirically on vancomycin and Zosyn, she has defervesced and her leukocytosis is improving. - Vascular surgery was consulted who recommended a CT angiogram of right lower extremity to evaluate her vasculature, she is not a candidate for an open revascularization procedure due to her comorbidities. - The other option for her if she has a focal disease will probably be stenting and or BKA. - Blood pressure seems to be stable. - ABIs are pending. - Pain is well controlled with narcotics did not get much sleep overnight, she is at high risk of developing delirium. - We'll use Haldol when necessary for agitation, check an EKG in the morning.  Preop Evaluation: - Due to age and multiple comorbidities, chronic kidney disease atrophic fibrillation on Coumadin, aortic stenosis   She is high risk for cardiopulmonary complications. Family and patient understand and they would like to proceed with surgery if it's required. - I have explained risk and benefits to the family and they're still thinking about their decision.  Long term current use of anticoagulant therapy/  Paroxysmal atrial fibrillation: - INR subtherapeutic. - CHADS VASC2 score is 6.  Chronic kidney disease stage 3-4: Baseline creatinine 1.2-1.3 Continue to monitor. Start on IV fluid hydration and she is going for CT angio.  Essential hypertension: Continue metoprolol, continue to hold Ace.  Severe protein caloric malnutrition: Ensure 3 times a day.  Code Status:DNR/DNI Family Communication: daughter  Disposition Plan: inpatient   Consultants:  Ortho  Procedures:  CXR  Right foot x-ray  Antibiotics:  vanc and rocephin  HPI/Subjective: Pain controlled, did not get much sleep over night due to pain. She was also hallucinating as per patient.  Objective: Filed Vitals:   11/29/14 1700 11/29/14 1921  11/29/14 2103 11/30/14 0440  BP:  101/63 97/58 111/61  Pulse: 80 103 88 110  Temp:  98.4 F (36.9 C)  97.5 F (36.4 C)  TempSrc:  Oral  Oral  Resp:  16  18  Weight:      SpO2:  100%  100%    Intake/Output Summary (Last 24 hours) at 11/30/14 1033 Last data filed at 11/30/14 0900  Gross per 24 hour  Intake    480 ml  Output      0 ml  Net    480 ml   Filed Weights   11/28/14 2237  Weight: 58.7 kg (129 lb 6.6 oz)    Exam:  General: Alert, awake, oriented x3, in no acute distress. Cachectic. HEENT: No bruits, no goiter.  Heart: Regular rate and rhythm. Lungs: Good air movement, clear Abdomen: Soft, nontender, nondistended, positive bowel sounds.  Neuro: Grossly intact, nonfocal.   Data Reviewed: Basic Metabolic Panel:  Recent Labs Lab 11/28/14 1834 11/28/14 1944 11/29/14 0335 11/29/14 1116  NA 133* 135 133* 132*  K 5.4* 4.7 4.1 3.9  CL 97* 98* 97* 99*  CO2 24  --  25 24  GLUCOSE 106* 104* 124* 98  BUN 37* 53* 34* 33*  CREATININE 1.28* 1.30* 1.30* 1.22*  CALCIUM 9.5  --  9.2 8.8*   Liver Function Tests:  Recent Labs Lab 11/28/14 1834 11/29/14 0335 11/29/14 1116  AST 29 25 23   ALT 16 15 15   ALKPHOS 65 65 57  BILITOT 0.9 0.9 0.8  PROT 7.0 6.8 6.1*  ALBUMIN 2.8* 2.6* 2.4*   No results for input(s): LIPASE, AMYLASE in the last 168 hours. No results for input(s):  AMMONIA in the last 168 hours. CBC:  Recent Labs Lab 11/28/14 1834 11/28/14 1944 11/29/14 0335 11/29/14 1154  WBC 11.0*  --  17.4* 14.6*  NEUTROABS 8.7*  --   --  13.0*  HGB 10.6* 15.3* 10.9* 9.5*  HCT 31.8* 45.0 33.3* 29.9*  MCV 100.6*  --  99.7 98.0  PLT 228  --  234 226   Cardiac Enzymes: No results for input(s): CKTOTAL, CKMB, CKMBINDEX, TROPONINI in the last 168 hours. BNP (last 3 results) No results for input(s): BNP in the last 8760 hours.  ProBNP (last 3 results) No results for input(s): PROBNP in the last 8760 hours.  CBG: No results for input(s): GLUCAP in the last  168 hours.  Recent Results (from the past 240 hour(s))  Blood culture (routine x 2)     Status: None (Preliminary result)   Collection Time: 11/28/14  7:25 PM  Result Value Ref Range Status   Specimen Description BLOOD LEFT HAND  Final   Special Requests BOTTLES DRAWN AEROBIC ONLY 2CC  Final   Culture NO GROWTH < 24 HOURS  Final   Report Status PENDING  Incomplete  Blood culture (routine x 2)     Status: None (Preliminary result)   Collection Time: 11/28/14  7:39 PM  Result Value Ref Range Status   Specimen Description BLOOD LEFT ARM  Final   Special Requests BOTTLES DRAWN AEROBIC ONLY 3CC  Final   Culture NO GROWTH < 24 HOURS  Final   Report Status PENDING  Incomplete  MRSA PCR Screening     Status: None   Collection Time: 11/29/14 12:16 AM  Result Value Ref Range Status   MRSA by PCR NEGATIVE NEGATIVE Final    Comment:        The GeneXpert MRSA Assay (FDA approved for NASAL specimens only), is one component of a comprehensive MRSA colonization surveillance program. It is not intended to diagnose MRSA infection nor to guide or monitor treatment for MRSA infections.      Studies: Dg Chest Port 1 View  11/29/2014   CLINICAL DATA:  Sepsis. Possible necrotizing fasciitis involving the right great toe. Possible humerus fracture.  EXAM: PORTABLE CHEST - 1 VIEW  COMPARISON:  Right humerus radiographs -11/02/2014; chest radiograph - 10/25/2014; 02/11/2014; 02/03/2014  FINDINGS: Grossly unchanged enlarged cardiac silhouette and mediastinal contours with atherosclerotic plaque within the thoracic aorta. Unchanged retrocardiac opacity favored to represent a hiatal hernia. Mild pulmonary venous congestion without frank evidence of edema. No focal airspace opacities. No pleural effusion or pneumothorax. Unchanged bones including deformity involving the right humeral head as better depicted on right shoulder radiographs performed 11/02/2014  IMPRESSION: 1. Cardiomegaly without acute  cardiopulmonary disease. 2. Suspected hiatal hernia. 3. Persistent deformity involving the right humeral head as better depicted on right shoulder radiographs performed 11/02/2014   Electronically Signed   By: Simonne Come M.D.   On: 11/29/2014 09:13   Dg Foot Complete Right  11/28/2014   CLINICAL DATA:  Right toe erythema and discoloration, with pain. Initial encounter.  EXAM: RIGHT FOOT COMPLETE - 3+ VIEW  COMPARISON:  None.  FINDINGS: Diffuse soft tissue air is noted tracking about the great toe, concerning for necrotizing fasciitis. Underlying lucency within the first distal phalanx raises concern for osteomyelitis. There may be osteomyelitis involving the distal aspect of the first proximal phalanx, though this is difficult to fully characterize on radiograph.  Diffuse vascular calcifications are seen. Visualized joint spaces are grossly unremarkable.  IMPRESSION: 1. Diffuse soft  tissue air tracks about the great toe, concerning for necrotizing fasciitis. Underlying lucency within the first distal phalanx raises concern for osteomyelitis. There may be osteomyelitis involving the distal aspect of the first proximal phalanx, though this is difficult to fully characterize on radiograph. 2. Diffuse vascular calcifications seen.  These results were called by telephone at the time of interpretation on 11/28/2014 at 7:02 pm to Dr. Bethann Berkshire, who verbally acknowledged these results.   Electronically Signed   By: Roanna Raider M.D.   On: 11/28/2014 19:06    Scheduled Meds: . atorvastatin  10 mg Oral q1800  . cefTRIAXone (ROCEPHIN)  IV  2 g Intravenous Q24H  . metoprolol  50 mg Oral BID  . saccharomyces boulardii  250 mg Oral BID  . vancomycin  750 mg Intravenous Q24H   Continuous Infusions: . sodium chloride 60 mL/hr (11/29/14 1330)    Time Spent:  25 min   FELIZ Rosine Beat  [facility] Pager [phone].  If 7PM-7AM, please contact night-coverage at [email or web address], password  [facility] 11/30/2014, 10:33 AM  LOS: 2 days

## 2014-11-30 NOTE — Progress Notes (Signed)
Utilization Review Completed.Sanyia Dini T9/06/2014  

## 2014-12-01 LAB — CBC
HCT: 30.3 % — ABNORMAL LOW (ref 36.0–46.0)
HEMOGLOBIN: 9.7 g/dL — AB (ref 12.0–15.0)
MCH: 31.5 pg (ref 26.0–34.0)
MCHC: 32 g/dL (ref 30.0–36.0)
MCV: 98.4 fL (ref 78.0–100.0)
PLATELETS: 228 10*3/uL (ref 150–400)
RBC: 3.08 MIL/uL — AB (ref 3.87–5.11)
RDW: 13.4 % (ref 11.5–15.5)
WBC: 12.1 10*3/uL — AB (ref 4.0–10.5)

## 2014-12-01 LAB — HEPARIN LEVEL (UNFRACTIONATED)
HEPARIN UNFRACTIONATED: 0.74 [IU]/mL — AB (ref 0.30–0.70)
HEPARIN UNFRACTIONATED: 0.83 [IU]/mL — AB (ref 0.30–0.70)

## 2014-12-01 LAB — BASIC METABOLIC PANEL
Anion gap: 7 (ref 5–15)
BUN: 14 mg/dL (ref 6–20)
CALCIUM: 9.4 mg/dL (ref 8.9–10.3)
CO2: 25 mmol/L (ref 22–32)
CREATININE: 0.86 mg/dL (ref 0.44–1.00)
Chloride: 103 mmol/L (ref 101–111)
GFR, EST NON AFRICAN AMERICAN: 56 mL/min — AB (ref >60)
Glucose, Bld: 87 mg/dL (ref 65–99)
Potassium: 4.2 mmol/L (ref 3.5–5.1)
SODIUM: 135 mmol/L (ref 135–145)

## 2014-12-01 LAB — PROTIME-INR
INR: 1.49 (ref 0.00–1.49)
PROTHROMBIN TIME: 18.1 s — AB (ref 11.6–15.2)

## 2014-12-01 NOTE — Progress Notes (Signed)
ANTICOAGULATION CONSULT NOTE - Follow Up Consult  Pharmacy Consult for heparin Indication: atrial fibrillation  No Known Allergies  Patient Measurements: Weight: 129 lb 6.6 oz (58.7 kg) Heparin Dosing Weight:   Vital Signs: Temp: 98.9 F (37.2 C) (09/05 1400) Temp Source: Oral (09/05 1400) BP: 157/84 mmHg (09/05 1400) Pulse Rate: 77 (09/05 1400)  Labs:  Recent Labs  11/29/14 0335 11/29/14 1116 11/29/14 1154 11/30/14 0543 11/30/14 2306 12/01/14 0751 12/01/14 1000 12/01/14 1605  HGB 10.9*  --  9.5*  --   --  9.7*  --   --   HCT 33.3*  --  29.9*  --   --  30.3*  --   --   PLT 234  --  226  --   --  228  --   --   APTT  --  34  --   --   --   --   --   --   LABPROT 28.4* 28.8*  --  21.2*  --  18.1*  --   --   INR 2.71* 2.76*  --  1.84*  --  1.49  --   --   HEPARINUNFRC  --   --   --   --  0.16* 0.74*  --  0.83*  CREATININE 1.30* 1.22*  --   --   --   --  0.86  --     Estimated Creatinine Clearance: 36.3 mL/min (by C-G formula based on Cr of 0.86).   Medications:  Infusions:  . heparin 950 Units/hr (12/01/14 0940)    Assessment: 81 y/o female with Afib on warfarin PTA. Warfarin on hold for surgery. She continues on IV heparin. Heparin level is supratherapeutic at 0.83 on 950 units/hr despite rate decrease. No line changes per RN, no other complications noted.    Goal of Therapy:  Heparin level 0.3-0.7 units/ml Monitor platelets by anticoagulation protocol: Yes   Plan:  - Decrease heparin drip to 850 units/hr - 8 hr heparin level - Daily heparin level and CBC - Monitor for s/sx of bleeding  [facility], [place].D., BCPS Clinical Pharmacist Pager: [phone] 12/01/2014 4:48 PM

## 2014-12-01 NOTE — Progress Notes (Signed)
CSW confirmed pt is from [facility]- CSW will continue to follow and assess for return to facility when medically stable.  Merlyn Lot, LCSWA Clinical Social Worker [phone]

## 2014-12-01 NOTE — Progress Notes (Signed)
[facility] PROGRESS NOTE Assessment/Plan: Sepsis due to cellulitis with Gangrene of foot: - Started empirically on vancomycin and Zosyn, she has defervesced and her leukocytosis is improving. - Vascular surgery was consulted who recommended a CT angiogram of right lower extremity on 9.4.2016 with results as below. - ABIs showed right 0.8 and Left 0.7 - We'll use Haldol when necessary for agitation, check an EKG in the morning.  Preop Evaluation: - Due to age and multiple comorbidities, chronic kidney disease atrophic fibrillation on Coumadin, aortic stenosis   She is high risk for cardiopulmonary complications. Family and patient understand and they would like to proceed with surgery if it's required. - I have explained risk and benefits to the family and they're still thinking about their decision.  Long term current use of anticoagulant therapy/  Paroxysmal atrial fibrillation: - INR subtherapeutic. On Iv heparin. - CHADS VASC2 score is 6.  Chronic kidney disease stage 3-4: Baseline creatinine 1.2-1.3 Continue to monitor. b-met pending.  Essential hypertension: Continue metoprolol, continue to hold Ace.  Severe protein caloric malnutrition: Ensure 3 times a day.  Code Status:DNR/DNI Family Communication: daughter  Disposition Plan: inpatient   Consultants:  Ortho  Procedures:  CXR  Right foot x-ray  Antibiotics:  vanc and rocephin  HPI/Subjective: Pain controlled, slept well.  Objective: Filed Vitals:   11/30/14 1357 11/30/14 1934 11/30/14 2028 12/01/14 0445  BP: 112/74 122/66  141/74  Pulse:  91  91  Temp:  98.1 F (36.7 C)  98.1 F (36.7 C)  TempSrc:  Oral  Oral  Resp:  18 18 14   Weight:      SpO2:  100% 100% 97%    Intake/Output Summary (Last 24 hours) at 12/01/14 0851 Last data filed at 12/01/14 0400  Gross per 24 hour  Intake   1120 ml  Output    400 ml  Net    720 ml   Filed Weights   11/28/14 2237  Weight: 58.7 kg (129 lb  6.6 oz)    Exam:  General: Alert, awake, oriented x3, in no acute distress. Cachectic. HEENT: No bruits, no goiter.  Heart: Regular rate and rhythm. Lungs: Good air movement, clear Abdomen: Soft, nontender, nondistended, positive bowel sounds.  Neuro: Grossly intact, nonfocal.   Data Reviewed: Basic Metabolic Panel:  Recent Labs Lab 11/28/14 1834 11/28/14 1944 11/29/14 0335 11/29/14 1116  NA 133* 135 133* 132*  K 5.4* 4.7 4.1 3.9  CL 97* 98* 97* 99*  CO2 24  --  25 24  GLUCOSE 106* 104* 124* 98  BUN 37* 53* 34* 33*  CREATININE 1.28* 1.30* 1.30* 1.22*  CALCIUM 9.5  --  9.2 8.8*   Liver Function Tests:  Recent Labs Lab 11/28/14 1834 11/29/14 0335 11/29/14 1116  AST 29 25 23   ALT 16 15 15   ALKPHOS 65 65 57  BILITOT 0.9 0.9 0.8  PROT 7.0 6.8 6.1*  ALBUMIN 2.8* 2.6* 2.4*   No results for input(s): LIPASE, AMYLASE in the last 168 hours. No results for input(s): AMMONIA in the last 168 hours. CBC:  Recent Labs Lab 11/28/14 1834 11/28/14 1944 11/29/14 0335 11/29/14 1154 12/01/14 0751  WBC 11.0*  --  17.4* 14.6* 12.1*  NEUTROABS 8.7*  --   --  13.0*  --   HGB 10.6* 15.3* 10.9* 9.5* 9.7*  HCT 31.8* 45.0 33.3* 29.9* 30.3*  MCV 100.6*  --  99.7 98.0 98.4  PLT 228  --  234 226 228   Cardiac Enzymes: No  results for input(s): CKTOTAL, CKMB, CKMBINDEX, TROPONINI in the last 168 hours. BNP (last 3 results) No results for input(s): BNP in the last 8760 hours.  ProBNP (last 3 results) No results for input(s): PROBNP in the last 8760 hours.  CBG: No results for input(s): GLUCAP in the last 168 hours.  Recent Results (from the past 240 hour(s))  Blood culture (routine x 2)     Status: None (Preliminary result)   Collection Time: 11/28/14  7:25 PM  Result Value Ref Range Status   Specimen Description BLOOD LEFT HAND  Final   Special Requests BOTTLES DRAWN AEROBIC ONLY 2CC  Final   Culture NO GROWTH 2 DAYS  Final   Report Status PENDING  Incomplete  Blood  culture (routine x 2)     Status: None (Preliminary result)   Collection Time: 11/28/14  7:39 PM  Result Value Ref Range Status   Specimen Description BLOOD LEFT ARM  Final   Special Requests BOTTLES DRAWN AEROBIC ONLY 3CC  Final   Culture NO GROWTH 2 DAYS  Final   Report Status PENDING  Incomplete  MRSA PCR Screening     Status: None   Collection Time: 11/29/14 12:16 AM  Result Value Ref Range Status   MRSA by PCR NEGATIVE NEGATIVE Final    Comment:        The GeneXpert MRSA Assay (FDA approved for NASAL specimens only), is one component of a comprehensive MRSA colonization surveillance program. It is not intended to diagnose MRSA infection nor to guide or monitor treatment for MRSA infections.   Culture, blood (x 2)     Status: None (Preliminary result)   Collection Time: 11/29/14  9:50 AM  Result Value Ref Range Status   Specimen Description BLOOD LEFT ANTECUBITAL  Final   Special Requests BOTTLES DRAWN AEROBIC ONLY 4CC  Final   Culture NO GROWTH 1 DAY  Final   Report Status PENDING  Incomplete  Culture, blood (x 2)     Status: None (Preliminary result)   Collection Time: 11/29/14 10:00 AM  Result Value Ref Range Status   Specimen Description BLOOD BLOOD LEFT HAND  Final   Special Requests BOTTLES DRAWN AEROBIC ONLY Hooks  Final   Culture NO GROWTH 1 DAY  Final   Report Status PENDING  Incomplete     Studies: Ct Angio Low Extrem Right W/cm &/or Wo/cm  11/30/2014   CLINICAL DATA:  81 year old female with diffuse infrainguinal arterial occlusive disease and active right foot cellulitis. Evaluate for possible endovascular intervention versus amputation.  EXAM: CT ANGIOGRAPHY OF THE RIGHT UPPEREXTREMITY  TECHNIQUE: Multidetector CT imaging of the right upper extremitywas performed using the standard protocol during bolus administration of intravenous contrast. Multiplanar CT image reconstructions and MIPs were obtained to evaluate the vascular anatomy.  CONTRAST:  3m OMNIPAQUE  IOHEXOL 350 MG/ML SOLN  COMPARISON:  None.  FINDINGS: Aorta:Minimal calcification in the visualized infrarenal abdominal aorta. No evidence of stenosis, aneurysm or other abnormality.  Inflow: Widely patent right common, internal and external iliac arteries. Minimal calcific plaque.  Outflow: The right common femoral artery is widely patent and relatively disease free. The profunda femoral artery is widely patent. The superficial femoral artery demonstrates multifocal calcified atherosclerotic plaque but no significant narrowing through the level of the adductor canal. There is a focal high-grade stenosis of the most distal SFA/above the knee popliteal artery secondary to bulky calcified atherosclerotic plaque. The remainder the popliteal artery is moderately diseased with largely calcified atherosclerotic plaque.  Runoff: Suspect high-grade stenosis at the origin of the anterior tibial artery. The origin is not well seen. The anterior tibial artery then occludes in the proximal calf. The tibioperoneal trunk is occluded. Evaluation of reconstitution of the runoff vessels is limited secondary to relatively poor contrast opacification and significant vascular calcifications. The posterior tibial artery does appear to reconstitute above the ankle  Review of the MIP images confirms the above findings.  Surgical changes of prior right hip arthroplasty. No evidence of hardware complication. Streak artifact slightly limits evaluation of the upper thigh. Degenerative osteoarthritic changes are present at the knee joint. Changes are most significant in the lateral compartment. Loose bodies are present within the lateral aspect of the suprapatellar recess. No focal osseous lesion.  IMPRESSION: VASCULAR  1. Significant focal distal femoropopliteal disease secondary to bulky calcified atherosclerotic plaque. This lesion may be amenable to a combination of orbital atherectomy and angioplasty or primary stenting with a high  radial force stent such as Supera. 2. Severe runoff disease with complete occlusion of the tibioperoneal trunk and proximal occlusion of the anterior tibial artery. The posterior tibial artery does reconstitute above the ankle. Evaluation of the runoff vessels is somewhat limited by poor contrast opacification and artifact from calcified atherosclerotic plaque. Catheter directed angiography may offer better anatomical evaluation. NON VASCULAR  1. Knee joint osteoarthritis most significant in the lateral compartment. 2. Loose bodies are noted within the lateral aspect of the suprapatellar recess.  Signed,  Criselda Peaches, MD  [facility]  [facility]   Electronically Signed   By: Jacqulynn Cadet M.D.   On: 11/30/2014 15:16   Dg Chest Port 1 View  11/29/2014   CLINICAL DATA:  Sepsis. Possible necrotizing fasciitis involving the right great toe. Possible humerus fracture.  EXAM: PORTABLE CHEST - 1 VIEW  COMPARISON:  Right humerus radiographs -11/02/2014; chest radiograph - 10/25/2014; 02/11/2014; 02/03/2014  FINDINGS: Grossly unchanged enlarged cardiac silhouette and mediastinal contours with atherosclerotic plaque within the thoracic aorta. Unchanged retrocardiac opacity favored to represent a hiatal hernia. Mild pulmonary venous congestion without frank evidence of edema. No focal airspace opacities. No pleural effusion or pneumothorax. Unchanged bones including deformity involving the right humeral head as better depicted on right shoulder radiographs performed 11/02/2014  IMPRESSION: 1. Cardiomegaly without acute cardiopulmonary disease. 2. Suspected hiatal hernia. 3. Persistent deformity involving the right humeral head as better depicted on right shoulder radiographs performed 11/02/2014   Electronically Signed   By: Sandi Mariscal M.D.   On: 11/29/2014 09:13    Scheduled Meds: . atorvastatin  10 mg Oral q1800  . cefTRIAXone (ROCEPHIN)  IV  2 g Intravenous  Q24H  . metoprolol  50 mg Oral BID  . polyethylene glycol  17 g Oral Daily  . saccharomyces boulardii  250 mg Oral BID  . vancomycin  750 mg Intravenous Q24H   Continuous Infusions: . sodium chloride 75 mL/hr at 11/30/14 1115  . heparin 1,000 Units/hr (12/01/14 0002)    Time Spent:  25 min   Charlynne Cousins  [facility] Pager [phone].  If 7PM-7AM, please contact night-coverage at [email or web address], password [facility] 12/01/2014, 8:51 AM  LOS: 3 days

## 2014-12-01 NOTE — Progress Notes (Signed)
ANTICOAGULATION CONSULT NOTE  Pharmacy Consult for Heparin Indication: atrial fibrillation  No Known Allergies  Patient Measurements: Weight: 129 lb 6.6 oz (58.7 kg)  Vital Signs: Temp: 98.1 F (36.7 C) (09/05 0445) Temp Source: Oral (09/05 0445) BP: 141/74 mmHg (09/05 0445) Pulse Rate: 91 (09/05 0445)  Labs:  Recent Labs  11/28/14 1944 11/29/14 0335 11/29/14 1116 11/29/14 1154 11/30/14 0543 11/30/14 2306 12/01/14 0751  HGB 15.3* 10.9*  --  9.5*  --   --  9.7*  HCT 45.0 33.3*  --  29.9*  --   --  30.3*  PLT  --  234  --  226  --   --  228  APTT  --   --  34  --   --   --   --   LABPROT  --  28.4* 28.8*  --  21.2*  --  18.1*  INR  --  2.71* 2.76*  --  1.84*  --  1.49  HEPARINUNFRC  --   --   --   --   --  0.16* 0.74*  CREATININE 1.30* 1.30* 1.22*  --   --   --   --     Estimated Creatinine Clearance: 25.6 mL/min (by C-G formula based on Cr of 1.22).  Assessment: 81 y.o. female with h/o Afib, Coumadin on hold. Patient currently on heparin and last level was SUPRAtherapeutic at 0.74. Patient received subQ heparin yesterday which may explain the quick increase in heparin level. No reported bleeding.   Goal of Therapy:  Heparin level 0.3-0.7 units/ml Monitor platelets by anticoagulation protocol: Yes   Plan:  Decrease Heparin to 950 units/hr Check heparin level in 8 hours.  Monitor for s/sx of bleeding   Juanita Craver, PharmD, BCPS Clinical Pharmacist [phone]

## 2014-12-01 NOTE — Progress Notes (Signed)
   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 However, I think that this is falsely elevated as she has significantly calcified vessels. In addition, I have reviewed her arteriogram which shows disease in the distal superficial femoral artery and popliteal artery with severe tibial artery occlusive disease. Although radiology has suggested possible atherectomy and angioplasty of the stenosis in the distal superficial femoral artery and popliteal artery, I am concerned that with her distal disease this would still not significantly impact distal perfusion and may not be worth the risk in this 81 year old woman with significant cardiac history (History of aortic stenosis, congestive heart., And previous myocardial infarction post parent. In addition, I do not think she is a candidate for a tibial bypass given the increased risk and poor distal targets related to severe calcific disease.  *  I think the safest options would be either amputation of the right great toe with a 50-50 chance of healing versus primary right below the knee amputation. I have discussed this with the family. Dr. Lajoyce Corners is following also.   SUBJECTIVE: No complaints  PHYSICAL EXAM: Filed Vitals:   11/30/14 1357 11/30/14 1934 11/30/14 2028 12/01/14 0445  BP: 112/74 122/66  141/74  Pulse:  91  91  Temp:  98.1 F (36.7 C)  98.1 F (36.7 C)  TempSrc:  Oral  Oral  Resp:  Weight:      SpO2:  100% 100% 97%   Dry gangrene of right great toe is unchanged.   LABS: Lab Results  Component Value Date   WBC 12.1* 12/01/2014   HGB 9.7* 12/01/2014   HCT 30.3* 12/01/2014   MCV 98.4 12/01/2014   PLT 228 12/01/2014   Lab Results  Component Value Date   CREATININE 1.22* 11/29/2014   Lab Results  Component Value Date   INR 1.49 12/01/2014   Active Problems:   Paroxysmal atrial fibrillation   Chronic kidney disease   Long term current use of anticoagulant therapy   Gangrene of foot   Cellulitis of right foot   Cari Caraway Beeper: [phone] 12/01/2014

## 2014-12-01 NOTE — Care Management Important Message (Signed)
Important Message  Patient Details  Name: Judy Grant MRN: [id number] Date of Birth: July 10, 1917   Medicare Important Message Given:  Yes-second notification given    Bernadette Hoit 12/01/2014, 8:48 AM

## 2014-12-02 ENCOUNTER — Encounter: Payer: Self-pay | Admitting: Cardiovascular Disease

## 2014-12-02 ENCOUNTER — Inpatient Hospital Stay (HOSPITAL_COMMUNITY): Payer: Medicare Other

## 2014-12-02 DIAGNOSIS — R0600 Dyspnea, unspecified: Secondary | ICD-10-CM

## 2014-12-02 LAB — CBC
HEMATOCRIT: 31.9 % — AB (ref 36.0–46.0)
Hemoglobin: 10.6 g/dL — ABNORMAL LOW (ref 12.0–15.0)
MCH: 32.5 pg (ref 26.0–34.0)
MCHC: 33.2 g/dL (ref 30.0–36.0)
MCV: 97.9 fL (ref 78.0–100.0)
Platelets: 267 10*3/uL (ref 150–400)
RBC: 3.26 MIL/uL — AB (ref 3.87–5.11)
RDW: 13.3 % (ref 11.5–15.5)
WBC: 13.3 10*3/uL — AB (ref 4.0–10.5)

## 2014-12-02 LAB — HEPARIN LEVEL (UNFRACTIONATED)
HEPARIN UNFRACTIONATED: 0.73 [IU]/mL — AB (ref 0.30–0.70)
Heparin Unfractionated: 0.49 IU/mL (ref 0.30–0.70)

## 2014-12-02 LAB — PROTIME-INR
INR: 1.45 (ref 0.00–1.49)
Prothrombin Time: 17.7 seconds — ABNORMAL HIGH (ref 11.6–15.2)

## 2014-12-02 MED ORDER — MORPHINE SULFATE (PF) 4 MG/ML IV SOLN
4.0000 mg | INTRAVENOUS | Status: DC | PRN
  Administered 2014-12-02 – 2014-12-07 (×7): 2 mg via INTRAVENOUS
  Filled 2014-12-02 (×7): qty 1

## 2014-12-02 MED ORDER — AMLODIPINE BESYLATE 5 MG PO TABS: 5.0000 mg | ORAL_TABLET | Freq: Every day | ORAL | Status: DC

## 2014-12-02 MED ORDER — METOPROLOL TARTRATE 50 MG PO TABS
75.0000 mg | ORAL_TABLET | Freq: Two times a day (BID) | ORAL | Status: DC
  Administered 2014-12-02 – 2014-12-08 (×11): 75 mg via ORAL
  Filled 2014-12-02 (×17): qty 1

## 2014-12-02 MED ORDER — HYDRALAZINE HCL 20 MG/ML IJ SOLN
5.0000 mg | Freq: Once | INTRAMUSCULAR | Status: AC
  Administered 2014-12-02: 5 mg via INTRAVENOUS
  Filled 2014-12-02: qty 1

## 2014-12-02 NOTE — Progress Notes (Signed)
   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 Although radiology has suggested possible atherectomy and angioplasty of the stenosis in the distal superficial femoral artery and popliteal artery, I am concerned that with her distal disease this would still not significantly impact distal perfusion and may not be worth the risk in this 81 year old woman with significant cardiac history (History of aortic stenosis, congestive heart, and previous myocardial infarction). In addition, I do not think she is a candidate for a tibial bypass given the increased risk and poor distal targets related to severe calcific disease.  * I think the safest options would be either amputation of the right great toe with a 50-50 chance of healing versus primary right below the knee amputation. I have discussed this with the family. Dr. Lajoyce Corners is following also.   SUBJECTIVE: Hurts all over.  PHYSICAL EXAM: Filed Vitals:   12/01/14 1400 12/01/14 1938 12/02/14 0602 12/02/14 0620  BP: 157/84 145/86 167/103 160/100  Pulse: 77 98 116   Temp: 98.9 F (37.2 C) 99.4 F (37.4 C) 98.4 F (36.9 C)   TempSrc: Oral Oral Oral   Resp: Weight:      SpO2: 100% 97% 100%    Dry gangrene of right great toe unchanged.   LABS: Lab Results  Component Value Date   WBC 13.3* 12/02/2014   HGB 10.6* 12/02/2014   HCT 31.9* 12/02/2014   MCV 97.9 12/02/2014   PLT 267 12/02/2014   Lab Results  Component Value Date   CREATININE 0.86 12/01/2014   Lab Results  Component Value Date   INR 1.45 12/02/2014   CBG (last 3)  No results for input(s): GLUCAP in the last 72 hours.  Active Problems:   Paroxysmal atrial fibrillation   Chronic kidney disease   Long term current use of  anticoagulant therapy   Gangrene of foot   Cellulitis of right foot    Cari Caraway Beeper: [phone] 12/02/2014

## 2014-12-02 NOTE — Progress Notes (Signed)
  Echocardiogram 2D Echocardiogram has been performed.  Judy Grant 12/02/2014, 5:17 PM

## 2014-12-02 NOTE — Progress Notes (Addendum)
ANTICOAGULATION + ANTIMICROBIAL CONSULT NOTE  Pharmacy Consult for Heparin/vancomycin Indication: atrial fibrillation/dry gangrene and cellulitis  No Known Allergies  Patient Measurements: Weight: 129 lb 6.6 oz (58.7 kg)  Vital Signs: Temp: 98.4 F (36.9 C) (09/06 0602) Temp Source: Oral (09/06 0602) BP: 160/100 mmHg (09/06 0941) Pulse Rate: 116 (09/06 0602)  Labs:  Recent Labs  11/30/14 0543  12/01/14 0751 12/01/14 1000 12/01/14 1605 12/02/14 0145 12/02/14 1133  HGB  --   --  9.7*  --   --  10.6*  --   HCT  --   --  30.3*  --   --  31.9*  --   PLT  --   --  228  --   --  267  --   LABPROT 21.2*  --  18.1*  --   --  17.7*  --   INR 1.84*  --  1.49  --   --  1.45  --   HEPARINUNFRC  --   < > 0.74*  --  0.83* 0.49 0.73*  CREATININE  --   --   --  0.86  --   --   --   < > = values in this interval not displayed.  Estimated Creatinine Clearance: 36.3 mL/min (by C-G formula based on Cr of 0.86).   Assessment: 81 yo female on warfarin pta for AFib . Most recent dose looks like 5 mg Mondays, 3.5 mg all other days. Pt has dry gangrene in R great toe with arterial occlusive disease. Pt may require amputation. Warfarin is on hold while patient's family is considering surgery vs conservative management. HL slightly supratherapeutic this morning, but has been consistently fluctuating.   On vancomycin day #5 and ceftriaxone day #4 for dry gangrene of R great toe. Vascular surgery is recommending amputation, but family is still considering.   Goal of Therapy:  Heparin level 0.3-0.7 units/ml Monitor platelets by anticoagulation protocol: Yes   Plan:  -Reduce heparin to 800 units/hr -Daily HL, CBC -Monitor s/sx bleeding -F/u decision for surgery/plan to resume PO AC -Continue vancomycin 750/24h -VT tomorrow -Watch renal fx and cultures   Agapito Games, PharmD, BCPS Clinical Pharmacist Pager: [phone] 12/02/2014 1:20 PM

## 2014-12-02 NOTE — Progress Notes (Signed)
ANTICOAGULATION CONSULT NOTE - Follow Up Consult  Pharmacy Consult for Heparin (warfarin on hold) Indication: atrial fibrillation  No Known Allergies  Patient Measurements: Weight: 129 lb 6.6 oz (58.7 kg)  Vital Signs: Temp: 99.4 F (37.4 C) (09/05 1938) Temp Source: Oral (09/05 1938) BP: 145/86 mmHg (09/05 1938) Pulse Rate: 98 (09/05 1938)  Labs:  Recent Labs  11/29/14 1116  11/29/14 1154 11/30/14 0543  12/01/14 0751 12/01/14 1000 12/01/14 1605 12/02/14 0145  HGB  --   < > 9.5*  --   --  9.7*  --   --  10.6*  HCT  --   --  29.9*  --   --  30.3*  --   --  31.9*  PLT  --   --  226  --   --  228  --   --  267  APTT 34  --   --   --   --   --   --   --   --   LABPROT 28.8*  --   --  21.2*  --  18.1*  --   --  17.7*  INR 2.76*  --   --  1.84*  --  1.49  --   --  1.45  HEPARINUNFRC  --   --   --   --   < > 0.74*  --  0.83* 0.49  CREATININE 1.22*  --   --   --   --   --  0.86  --   --   < > = values in this interval not displayed.  Estimated Creatinine Clearance: 36.3 mL/min (by C-G formula based on Cr of 0.86).   Assessment: Therapeutic heparin level x 1 after rate decrease  Goal of Therapy:  Heparin level 0.3-0.7 units/ml Monitor platelets by anticoagulation protocol: Yes   Plan:  -Continue heparin at 850 units/hr -1100 confirmatory HL   Judy Grant 12/02/2014,3:57 AM

## 2014-12-02 NOTE — Clinical Social Work Note (Signed)
Clinical Social Work Assessment  Patient Details  Name: Judy Grant MRN: [id number] Date of Birth: 1917-11-06  Date of referral:  12/02/14               Reason for consult:  Facility Placement                Permission sought to share information with:  Family Supports Permission granted to share information::     Name::     Rosey Bath  Agency::  [facility]  Relationship::  daughter  Contact Information:     Housing/Transportation Living arrangements for the past 2 months:  Skilled Building surveyor of Information:  Adult Children Patient Interpreter Needed:  None Criminal Activity/Legal Involvement Pertinent to Current Situation/Hospitalization:  No - Comment as needed Significant Relationships:  Adult Children Lives with:  Facility Resident Do you feel safe going back to the place where you live?  No Need for family participation in patient care:  Yes (Comment)  Care giving concerns:  Dtr has some concerns with [facility] caregiving- does not feel as if pt wound should have gotten as bad as it is   Office manager / plan:  CSW spoke with pt dtr at bedside concerning return to [facility] at time of DC  Employment status:  Retired Health and safety inspector:  Teacher, English as a foreign language, Medicaid In [place] PT Recommendations:  Not assessed at this time Information / Referral to community resources:  Skilled Nursing Facility  Patient/Family's Response to care:  Pt dtr reports that pt has be LTC resident at [facility] for about a year- pt daughter would like to change facilities if possible and believes that pt condition is partly due to facility care  Patient/Family's Understanding of and Emotional Response to Diagnosis, Current Treatment, and Prognosis:  Pt dtr is not sure what plan is yet for pt- is waiting to speak with MD concerning pt prognosis/treatment plan  Emotional Assessment Appearance:  Appears stated age Attitude/Demeanor/Rapport:  Unable to Assess Affect  (typically observed):  Unable to Assess Orientation:  Oriented to Self, Oriented to Place, Oriented to  Time Alcohol / Substance use:  Not Applicable Psych involvement (Current and /or in the community):  No (Comment)  Discharge Needs  Concerns to be addressed:    Readmission within the last 30 days:  No Current discharge risk:  None Barriers to Discharge:  Continued Medical Work up   [employer], LCSW 12/02/2014, 10:35 AM

## 2014-12-02 NOTE — Progress Notes (Addendum)
[facility] PROGRESS NOTE Assessment/Plan: Sepsis due to cellulitis with Gangrene of foot: - Started empirically on vancomycin and Rocephin on admission - Vascular surgery was consulted who recommended a CT angiogram of right lower extremity on 9.4.2016,  - Vascular recommended amputation of the liver right great toe with a 50% as of healing this was discussed with the family and the and they understand the risk and benefits - A to have better control of her pain increased narcotics. - We'll use Haldol when necessary for agitation, check an EKG in the morning.  Preop Evaluation: - Due to age and multiple comorbidities, chronic kidney disease atrophic fibrillation on Coumadin, aortic stenosis   She is high risk for cardiopulmonary complications. Family and patient understand and they would like to proceed with surgery if it's required. - I have explained risk and benefits to the family and they're still thinking about their decision. She had previous echocardiogram 2004 I will repeat her 2-D echo  Essential hypertension: Increase metoprolol. Need to have better control the pain.  Long term current use of anticoagulant therapy/  Paroxysmal atrial fibrillation: - INR subtherapeutic. On Iv heparin. - CHADS VASC2 score is 6.  Chronic kidney disease stage 3-4: Creatinine at 1.2-1.3  Essential hypertension: Continue metoprolol, continue to hold Ace.  Severe protein caloric malnutrition: Ensure 3 times a day.  Code Status:DNR/DNI Family Communication: daughter  Disposition Plan: inpatient   Consultants:  Ortho  Procedures:  CXR  Right foot x-ray  Antibiotics:  vanc and rocephin  HPI/Subjective: Did not sleep well, her pain is not well controlled.  Objective: Filed Vitals:   12/01/14 1938 12/02/14 0602 12/02/14 0620 12/02/14 0941  BP: 145/86 167/103 160/100 160/100  Pulse: 98 116    Temp: 99.4 F (37.4 C) 98.4 F (36.9 C)    TempSrc: Oral Oral      Resp: 18 18    Weight:      SpO2: 97% 100%      Intake/Output Summary (Last 24 hours) at 12/02/14 1012 Last data filed at 12/02/14 0839  Gross per 24 hour  Intake    240 ml  Output   1550 ml  Net  -1310 ml   Filed Weights   11/28/14 2237  Weight: 58.7 kg (129 lb 6.6 oz)    Exam:  General: Alert, awake, oriented x3, in no acute distress. Cachectic. HEENT: No bruits, no goiter.  Heart: Regular rate and rhythm. Lungs: Good air movement, clear Abdomen: Soft, nontender, nondistended, positive bowel sounds.  Neuro: Grossly intact, nonfocal.   Data Reviewed: Basic Metabolic Panel:  Recent Labs Lab 11/28/14 1834 11/28/14 1944 11/29/14 0335 11/29/14 1116 12/01/14 1000  NA 133* 135 133* 132* 135  K 5.4* 4.7 4.1 3.9 4.2  CL 97* 98* 97* 99* 103  CO2 24  --  GLUCOSE 106* 104* 124* 98 87  BUN 37* 53* 34* 33* 14  CREATININE 1.28* 1.30* 1.30* 1.22* 0.86  CALCIUM 9.5  --  9.2 8.8* 9.4   Liver Function Tests:  Recent Labs Lab 11/28/14 1834 11/29/14 0335 11/29/14 1116  AST ALT ALKPHOS 65 65 57  BILITOT 0.9 0.9 0.8  PROT 7.0 6.8 6.1*  ALBUMIN 2.8* 2.6* 2.4*   No results for input(s): LIPASE, AMYLASE in the last 168 hours. No results for input(s): AMMONIA in the last 168 hours. CBC:  Recent Labs Lab 11/28/14 1834 11/28/14 1944 11/29/14 0335 11/29/14 1154 12/01/14 0751 12/02/14  0145  WBC 11.0*  --  17.4* 14.6* 12.1* 13.3*  NEUTROABS 8.7*  --   --  13.0*  --   --   HGB 10.6* 15.3* 10.9* 9.5* 9.7* 10.6*  HCT 31.8* 45.0 33.3* 29.9* 30.3* 31.9*  MCV 100.6*  --  99.7 98.0 98.4 97.9  PLT 228  --  234 226 228 267   Cardiac Enzymes: No results for input(s): CKTOTAL, CKMB, CKMBINDEX, TROPONINI in the last 168 hours. BNP (last 3 results) No results for input(s): BNP in the last 8760 hours.  ProBNP (last 3 results) No results for input(s): PROBNP in the last 8760 hours.  CBG: No results for input(s): GLUCAP in the last 168  hours.  Recent Results (from the past 240 hour(s))  Blood culture (routine x 2)     Status: None (Preliminary result)   Collection Time: 11/28/14  7:25 PM  Result Value Ref Range Status   Specimen Description BLOOD LEFT HAND  Final   Special Requests BOTTLES DRAWN AEROBIC ONLY 2CC  Final   Culture NO GROWTH 3 DAYS  Final   Report Status PENDING  Incomplete  Blood culture (routine x 2)     Status: None (Preliminary result)   Collection Time: 11/28/14  7:39 PM  Result Value Ref Range Status   Specimen Description BLOOD LEFT ARM  Final   Special Requests BOTTLES DRAWN AEROBIC ONLY 3CC  Final   Culture NO GROWTH 3 DAYS  Final   Report Status PENDING  Incomplete  MRSA PCR Screening     Status: None   Collection Time: 11/29/14 12:16 AM  Result Value Ref Range Status   MRSA by PCR NEGATIVE NEGATIVE Final    Comment:        The GeneXpert MRSA Assay (FDA approved for NASAL specimens only), is one component of a comprehensive MRSA colonization surveillance program. It is not intended to diagnose MRSA infection nor to guide or monitor treatment for MRSA infections.   Culture, blood (x 2)     Status: None (Preliminary result)   Collection Time: 11/29/14  9:50 AM  Result Value Ref Range Status   Specimen Description BLOOD LEFT ANTECUBITAL  Final   Special Requests BOTTLES DRAWN AEROBIC ONLY 4CC  Final   Culture NO GROWTH 2 DAYS  Final   Report Status PENDING  Incomplete  Culture, blood (x 2)     Status: None (Preliminary result)   Collection Time: 11/29/14 10:00 AM  Result Value Ref Range Status   Specimen Description BLOOD BLOOD LEFT HAND  Final   Special Requests BOTTLES DRAWN AEROBIC ONLY 7CC  Final   Culture NO GROWTH 2 DAYS  Final   Report Status PENDING  Incomplete     Studies: Ct Angio Low Extrem Right W/cm &/or Wo/cm  11/30/2014   CLINICAL DATA:  81 year old female with diffuse infrainguinal arterial occlusive disease and active right foot cellulitis. Evaluate for possible  endovascular intervention versus amputation.  EXAM: CT ANGIOGRAPHY OF THE RIGHT UPPEREXTREMITY  TECHNIQUE: Multidetector CT imaging of the right upper extremitywas performed using the standard protocol during bolus administration of intravenous contrast. Multiplanar CT image reconstructions and MIPs were obtained to evaluate the vascular anatomy.  CONTRAST:  80mL OMNIPAQUE IOHEXOL 350 MG/ML SOLN  COMPARISON:  None.  FINDINGS: Aorta:Minimal calcification in the visualized infrarenal abdominal aorta. No evidence of stenosis, aneurysm or other abnormality.  Inflow: Widely patent right common, internal and external iliac arteries. Minimal calcific plaque.  Outflow: The right common femoral artery is  widely patent and relatively disease free. The profunda femoral artery is widely patent. The superficial femoral artery demonstrates multifocal calcified atherosclerotic plaque but no significant narrowing through the level of the adductor canal. There is a focal high-grade stenosis of the most distal SFA/above the knee popliteal artery secondary to bulky calcified atherosclerotic plaque. The remainder the popliteal artery is moderately diseased with largely calcified atherosclerotic plaque.  Runoff: Suspect high-grade stenosis at the origin of the anterior tibial artery. The origin is not well seen. The anterior tibial artery then occludes in the proximal calf. The tibioperoneal trunk is occluded. Evaluation of reconstitution of the runoff vessels is limited secondary to relatively poor contrast opacification and significant vascular calcifications. The posterior tibial artery does appear to reconstitute above the ankle  Review of the MIP images confirms the above findings.  Surgical changes of prior right hip arthroplasty. No evidence of hardware complication. Streak artifact slightly limits evaluation of the upper thigh. Degenerative osteoarthritic changes are present at the knee joint. Changes are most significant in the  lateral compartment. Loose bodies are present within the lateral aspect of the suprapatellar recess. No focal osseous lesion.  IMPRESSION: VASCULAR  1. Significant focal distal femoropopliteal disease secondary to bulky calcified atherosclerotic plaque. This lesion may be amenable to a combination of orbital atherectomy and angioplasty or primary stenting with a high radial force stent such as Supera. 2. Severe runoff disease with complete occlusion of the tibioperoneal trunk and proximal occlusion of the anterior tibial artery. The posterior tibial artery does reconstitute above the ankle. Evaluation of the runoff vessels is somewhat limited by poor contrast opacification and artifact from calcified atherosclerotic plaque. Catheter directed angiography may offer better anatomical evaluation. NON VASCULAR  1. Knee joint osteoarthritis most significant in the lateral compartment. 2. Loose bodies are noted within the lateral aspect of the suprapatellar recess.  Signed,  Sterling Big, MD  [facility]  [facility]   Electronically Signed   By: Malachy Moan M.D.   On: 11/30/2014 15:16    Scheduled Meds: . atorvastatin  10 mg Oral q1800  . cefTRIAXone (ROCEPHIN)  IV  2 g Intravenous Q24H  . metoprolol  50 mg Oral BID  . polyethylene glycol  17 g Oral Daily  . saccharomyces boulardii  250 mg Oral BID  . vancomycin  750 mg Intravenous Q24H   Continuous Infusions: . heparin 850 Units/hr (12/01/14 2221)    Time Spent:  25 min   Marinda Elk  [facility] Pager [phone].  If 7PM-7AM, please contact night-coverage at [email or web address], password [facility] 12/02/2014, 10:12 AM  LOS: 4 days

## 2014-12-02 NOTE — Clinical Social Work Placement (Signed)
   CLINICAL SOCIAL WORK PLACEMENT  NOTE  Date:  12/02/2014  Patient Details  Name: Judy Grant MRN: [id number] Date of Birth: 1917/10/25  Clinical Social Work is seeking post-discharge placement for this patient at the Skilled  Nursing Facility level of care (*CSW will initial, date and re-position this form in  chart as items are completed):  Yes   Patient/family provided with [facility] [place] Clinical Social Work Department's list of facilities offering this level of care within the geographic area requested by the patient (or if unable, by the patient's family).  Yes   Patient/family informed of their freedom to choose among providers that offer the needed level of care, that participate in Medicare, Medicaid or managed care program needed by the patient, have an available bed and are willing to accept the patient.  Yes   Patient/family informed of [facility] ownership interest in [facility] and [facility], as well as of the fact that they are under no obligation to receive care at these facilities.  PASRR submitted to EDS on       PASRR number received on       Existing PASRR number confirmed on 12/02/14     FL2 transmitted to all facilities in geographic area requested by pt/family on 12/02/14     FL2 transmitted to all facilities within larger geographic area on       Patient informed that his/her managed care company has contracts with or will negotiate with certain facilities, including the following:            Patient/family informed of bed offers received.  Patient chooses bed at       Physician recommends and patient chooses bed at      Patient to be transferred to   on  .  Patient to be transferred to facility by       Patient family notified on   of transfer.  Name of family member notified:        PHYSICIAN Please sign FL2     Additional Comment:    _______________________________________________ Izora Ribas, LCSW 12/02/2014, 10:48  AM

## 2014-12-03 ENCOUNTER — Other Ambulatory Visit (HOSPITAL_COMMUNITY): Payer: Self-pay | Admitting: Orthopedic Surgery

## 2014-12-03 DIAGNOSIS — I10 Essential (primary) hypertension: Secondary | ICD-10-CM

## 2014-12-03 DIAGNOSIS — N183 Chronic kidney disease, stage 3 (moderate): Secondary | ICD-10-CM

## 2014-12-03 DIAGNOSIS — I96 Gangrene, not elsewhere classified: Secondary | ICD-10-CM

## 2014-12-03 DIAGNOSIS — Z7901 Long term (current) use of anticoagulants: Secondary | ICD-10-CM

## 2014-12-03 DIAGNOSIS — I480 Paroxysmal atrial fibrillation: Secondary | ICD-10-CM

## 2014-12-03 LAB — CULTURE, BLOOD (ROUTINE X 2)
Culture: NO GROWTH
Culture: NO GROWTH

## 2014-12-03 LAB — HEPARIN LEVEL (UNFRACTIONATED): HEPARIN UNFRACTIONATED: 0.32 [IU]/mL (ref 0.30–0.70)

## 2014-12-03 LAB — CBC WITH DIFFERENTIAL/PLATELET
BASOS PCT: 0 % (ref 0–1)
Basophils Absolute: 0 10*3/uL (ref 0.0–0.1)
EOS ABS: 0 10*3/uL (ref 0.0–0.7)
EOS PCT: 0 % (ref 0–5)
HCT: 30.9 % — ABNORMAL LOW (ref 36.0–46.0)
Hemoglobin: 10 g/dL — ABNORMAL LOW (ref 12.0–15.0)
Lymphocytes Relative: 6 % — ABNORMAL LOW (ref 12–46)
Lymphs Abs: 1 10*3/uL (ref 0.7–4.0)
MCH: 31.2 pg (ref 26.0–34.0)
MCHC: 32.4 g/dL (ref 30.0–36.0)
MCV: 96.3 fL (ref 78.0–100.0)
MONO ABS: 1.1 10*3/uL — AB (ref 0.1–1.0)
MONOS PCT: 6 % (ref 3–12)
NEUTROS PCT: 87 % — AB (ref 43–77)
Neutro Abs: 14.8 10*3/uL — ABNORMAL HIGH (ref 1.7–7.7)
PLATELETS: 289 10*3/uL (ref 150–400)
RBC: 3.21 MIL/uL — ABNORMAL LOW (ref 3.87–5.11)
RDW: 13.3 % (ref 11.5–15.5)
WBC: 16.9 10*3/uL — ABNORMAL HIGH (ref 4.0–10.5)

## 2014-12-03 LAB — COMPREHENSIVE METABOLIC PANEL
ALBUMIN: 2.4 g/dL — AB (ref 3.5–5.0)
ALT: 15 U/L (ref 14–54)
ANION GAP: 12 (ref 5–15)
AST: 23 U/L (ref 15–41)
Alkaline Phosphatase: 62 U/L (ref 38–126)
BUN: 13 mg/dL (ref 6–20)
CO2: 23 mmol/L (ref 22–32)
Calcium: 9.5 mg/dL (ref 8.9–10.3)
Chloride: 97 mmol/L — ABNORMAL LOW (ref 101–111)
Creatinine, Ser: 0.88 mg/dL (ref 0.44–1.00)
GFR calc non Af Amer: 54 mL/min — ABNORMAL LOW (ref >60)
GLUCOSE: 165 mg/dL — AB (ref 65–99)
POTASSIUM: 4.3 mmol/L (ref 3.5–5.1)
SODIUM: 132 mmol/L — AB (ref 135–145)
Total Bilirubin: 0.8 mg/dL (ref 0.3–1.2)
Total Protein: 6.2 g/dL — ABNORMAL LOW (ref 6.5–8.1)

## 2014-12-03 LAB — CBC
HCT: 30.4 % — ABNORMAL LOW (ref 36.0–46.0)
Hemoglobin: 9.8 g/dL — ABNORMAL LOW (ref 12.0–15.0)
MCH: 31 pg (ref 26.0–34.0)
MCHC: 32.2 g/dL (ref 30.0–36.0)
MCV: 96.2 fL (ref 78.0–100.0)
PLATELETS: 276 10*3/uL (ref 150–400)
RBC: 3.16 MIL/uL — AB (ref 3.87–5.11)
RDW: 13.3 % (ref 11.5–15.5)
WBC: 15.3 10*3/uL — ABNORMAL HIGH (ref 4.0–10.5)

## 2014-12-03 LAB — PROTIME-INR
INR: 1.34 (ref 0.00–1.49)
INR: 1.25 (ref 0.00–1.49)
PROTHROMBIN TIME: 16.7 s — AB (ref 11.6–15.2)
Prothrombin Time: 15.8 seconds — ABNORMAL HIGH (ref 11.6–15.2)

## 2014-12-03 LAB — VANCOMYCIN, TROUGH: Vancomycin Tr: 18 ug/mL (ref 10.0–20.0)

## 2014-12-03 MED ORDER — ENSURE ENLIVE PO LIQD: 237.0000 mL | Freq: Two times a day (BID) | ORAL | Status: DC

## 2014-12-03 MED ORDER — SODIUM CHLORIDE 0.45 % IV SOLN
INTRAVENOUS | Status: DC
  Administered 2014-12-03 – 2014-12-04 (×2): 1 mL via INTRAVENOUS

## 2014-12-03 MED ORDER — BOOST / RESOURCE BREEZE PO LIQD: 1.0000 | Freq: Three times a day (TID) | ORAL | Status: DC

## 2014-12-03 MED ORDER — ENSURE ENLIVE PO LIQD
237.0000 mL | Freq: Three times a day (TID) | ORAL | Status: DC
  Administered 2014-12-05 – 2014-12-08 (×8): 237 mL via ORAL

## 2014-12-03 MED ORDER — CEFAZOLIN SODIUM-DEXTROSE 2-3 GM-% IV SOLR
2.0000 g | INTRAVENOUS | Status: DC
Start: 2014-12-04 — End: 2014-12-04

## 2014-12-03 NOTE — Progress Notes (Signed)
Patient ID: Judy Grant, female   DOB: 08-01-1917, 81 y.o.   MRN: [id number] Patient with persistent dry gangrenous changes of her right foot. I discussed with vascular surgery and patient is not a revascularization candidate. We will plan for a transtibial amputation tomorrow as an add-on surgery. I discussed this with the patient's son. He states he understands and wishes to proceed with surgery. Surgery most likely after 5 PM on Thursday.

## 2014-12-03 NOTE — Progress Notes (Signed)
[facility] PROGRESS NOTE Assessment/Plan: Sepsis due to cellulitis with Gangrene of foot: -continue empirically on vancomycin and Rocephin  -Vascular surgery was consulted, who recommended a CT angiogram of right lower extremity on 9.4.2016, she is not a candidate for revascularization  -per ortho plan is for right BKA on 9/8 -continue supportive care and PRN analgesics  Preop Evaluation: - Due to age and multiple comorbidities, chronic kidney disease, atrial fibrillation on Coumadin, aortic stenosis   She is high risk for cardiopulmonary complications. Family and patient understand and they would like to proceed with surgery. -2-D echo demonstrating worsening on aortic stenosis in compare to echo in 2004; preserved EF, no wall motion abnormalities  -surgery anticipated on 12/04/14  Long term current use of anticoagulant therapy/  Paroxysmal atrial fibrillation: -On Iv heparin in anticipation for surgery -CHADS VASC2 score is 6. -will resume coumadin once ok with ortho   Chronic kidney disease stage 3-4: -Creatinine at 1.2-1.3 -essentially at baseline  Essential hypertension: -Continue metoprolol -Ace on hold due to renal function -BP is fair controlled -pain contributing to intermittent high readings   Severe protein caloric malnutrition: Continue Ensure 3 times a day.  Code Status:DNR/DNI Family Communication: daughter  Disposition Plan: remains inpatient; surgery anticipated for tomorrow 9/8 (after 5pm); SNF at discharge anticipated    Consultants:  Ortho (Dr. Lajoyce Corners)  Vascular surgery  (Dr. Edilia Bo)  Procedures:  CXR  Right foot x-ray  Antibiotics:  vanc and rocephin  HPI/Subjective: No fever, no CP, no SOB. Reports some pain in her right foot, but slightly better.  Objective: Filed Vitals:   12/02/14 0941 12/02/14 1333 12/02/14 1953 12/03/14 0559  BP: 160/100 163/91 139/70 163/91  Pulse:  105 102 87  Temp:  98.1 F (36.7 C) 98 F (36.7 C)  98.2 F (36.8 C)  TempSrc:  Oral Oral Oral  Resp:  Weight:      SpO2:  96% 100% 100%    Intake/Output Summary (Last 24 hours) at 12/03/14 1208 Last data filed at 12/03/14 0600  Gross per 24 hour  Intake    300 ml  Output    500 ml  Net   -200 ml   Filed Weights   11/28/14 2237  Weight: 58.7 kg (129 lb 6.6 oz)    Exam:  General: Alert, awake, oriented x3, in no acute distress. Cachectic, frail and chronically ill in appearance . HEENT: No bruits, no goiter, no JVD Heart: positive SEM, no rubs, no gallops, slight tachycardia appreciated on exam; A. Fib per telemetry  Lungs: Good air movement, no crackles, no wheezing Abdomen: Soft, nontender, nondistended, positive bowel sounds.  Neuro: Grossly intact, nonfocal. MSK: right foot with signs of gangrene affecting great toe, trace edema bilaterally   Data Reviewed: Basic Metabolic Panel:  Recent Labs Lab 11/28/14 1834 11/28/14 1944 11/29/14 0335 11/29/14 1116 12/01/14 1000  NA 133* 135 133* 132* 135  K 5.4* 4.7 4.1 3.9 4.2  CL 97* 98* 97* 99* 103  CO2 24  --  GLUCOSE 106* 104* 124* 98 87  BUN 37* 53* 34* 33* 14  CREATININE 1.28* 1.30* 1.30* 1.22* 0.86  CALCIUM 9.5  --  9.2 8.8* 9.4   Liver Function Tests:  Recent Labs Lab 11/28/14 1834 11/29/14 0335 11/29/14 1116  AST ALT ALKPHOS 65 65 57  BILITOT 0.9 0.9 0.8  PROT 7.0 6.8 6.1*  ALBUMIN 2.8* 2.6* 2.4*  CBC:  Recent Labs Lab 11/28/14 1834  11/29/14 0335 11/29/14 1154 12/01/14 0751 12/02/14 0145 12/03/14 0534  WBC 11.0*  --  17.4* 14.6* 12.1* 13.3* 15.3*  NEUTROABS 8.7*  --   --  13.0*  --   --   --   HGB 10.6*  < > 10.9* 9.5* 9.7* 10.6* 9.8*  HCT 31.8*  < > 33.3* 29.9* 30.3* 31.9* 30.4*  MCV 100.6*  --  99.7 98.0 98.4 97.9 96.2  PLT 228  --  234 226 228 267 276  < > = values in this interval not displayed. Cardiac Enzymes: No results for input(s): CKTOTAL, CKMB, CKMBINDEX, TROPONINI in the last 168  hours. BNP (last 3 results) No results for input(s): BNP in the last 8760 hours.  ProBNP (last 3 results) No results for input(s): PROBNP in the last 8760 hours.  CBG: No results for input(s): GLUCAP in the last 168 hours.  Recent Results (from the past 240 hour(s))  Blood culture (routine x 2)     Status: None (Preliminary result)   Collection Time: 11/28/14  7:25 PM  Result Value Ref Range Status   Specimen Description BLOOD LEFT HAND  Final   Special Requests BOTTLES DRAWN AEROBIC ONLY 2CC  Final   Culture NO GROWTH 4 DAYS  Final   Report Status PENDING  Incomplete  Blood culture (routine x 2)     Status: None (Preliminary result)   Collection Time: 11/28/14  7:39 PM  Result Value Ref Range Status   Specimen Description BLOOD LEFT ARM  Final   Special Requests BOTTLES DRAWN AEROBIC ONLY 3CC  Final   Culture NO GROWTH 4 DAYS  Final   Report Status PENDING  Incomplete  MRSA PCR Screening     Status: None   Collection Time: 11/29/14 12:16 AM  Result Value Ref Range Status   MRSA by PCR NEGATIVE NEGATIVE Final    Comment:        The GeneXpert MRSA Assay (FDA approved for NASAL specimens only), is one component of a comprehensive MRSA colonization surveillance program. It is not intended to diagnose MRSA infection nor to guide or monitor treatment for MRSA infections.   Culture, blood (x 2)     Status: None (Preliminary result)   Collection Time: 11/29/14  9:50 AM  Result Value Ref Range Status   Specimen Description BLOOD LEFT ANTECUBITAL  Final   Special Requests BOTTLES DRAWN AEROBIC ONLY 4CC  Final   Culture NO GROWTH 3 DAYS  Final   Report Status PENDING  Incomplete  Culture, blood (x 2)     Status: None (Preliminary result)   Collection Time: 11/29/14 10:00 AM  Result Value Ref Range Status   Specimen Description BLOOD BLOOD LEFT HAND  Final   Special Requests BOTTLES DRAWN AEROBIC ONLY 7CC  Final   Culture NO GROWTH 3 DAYS  Final   Report Status PENDING   Incomplete     Studies: No results found.  Scheduled Meds: . atorvastatin  10 mg Oral q1800  . cefTRIAXone (ROCEPHIN)  IV  2 g Intravenous Q24H  . metoprolol  75 mg Oral BID  . polyethylene glycol  17 g Oral Daily  . saccharomyces boulardii  250 mg Oral BID  . vancomycin  750 mg Intravenous Q24H   Continuous Infusions: . heparin 800 Units/hr (12/02/14 1352)    Time Spent:  25 min   Vassie Loll  [facility] Pager [phone].  If 7PM-7AM, please contact night-coverage at [email or web address],  password [email or web address] 12/03/2014, 12:08 PM  LOS: 5 days

## 2014-12-03 NOTE — Progress Notes (Addendum)
Initial Nutrition Assessment  DOCUMENTATION CODES:   Not applicable  INTERVENTION:   Ensure Enlive po TID, each supplement provides 350 kcal and 20 grams of protein  NUTRITION DIAGNOSIS:   Increased nutrient needs related to wound healing as evidenced by estimated needs  GOAL:   Patient will meet greater than or equal to 90% of their needs  MONITOR:   PO intake, Supplement acceptance, Labs, Weight trends, I & O's  REASON FOR ASSESSMENT:   Low Braden  ASSESSMENT:   81 y.o. Female with PMH of HTN, CKD, osteoporosis, anemai, CHF; send to the ED from nursing home for worsening of right toe pain along with black colored discoloration.   RD spoke with pt's daughter at bedside.  Reports pt typically eats fairly well.  Sometimes drinks Ensure supplements (was receiving at SNF prior to admission).  Daughter reports pt has no chewing or swallowing difficulty.  Weight has been stable.  Would benefit from addition of supplements during hospitalization.  RD to order.  Low braden score places patient at risk for further skin breakdown.  Pt asleep upon visit.  RD deferred Nutrition Focused Physical Exam.  RD suspects malnutrition, however, unable to identify at this time.  Diet Order:  Diet regular Room service appropriate?: Yes; Fluid consistency:: Thin  Skin:  Wound (see comment) (gangrene toe)  Last BM:  9/1  Height:   Ht Readings from Last 1 Encounters:  11/04/14  (1.676 m)    Weight:   Wt Readings from Last 1 Encounters:  11/28/14 129 lb 6.6 oz (58.7 kg)    Ideal Body Weight:  59 kg  BMI:  Body mass index is 20.9 kg/(m^2).  Estimated Nutritional Needs:   Kcal:  1300-1500  Protein:  65-75 gm  Fluid:  >/= 1.5 L  EDUCATION NEEDS:   No education needs identified at this time  Maureen Chatters, RD, LDN Pager #: [phone] After-Hours Pager #: [phone]

## 2014-12-03 NOTE — Progress Notes (Signed)
During last rounding on pt at 1850, pt was noted to have small blood on bed pad, foley urine yellow and clear with no blood tinged noted; pt sacrum dry but vaginal had small blood when wiped with wash cloth and as well as the external foley catheter. No active bleeding noted after cleaning pt. New pads placed and reported off to oncoming RN to continue to monitor pt. Arabella Merles Tadarius Maland RN.

## 2014-12-03 NOTE — Progress Notes (Signed)
VASCULAR SURGERY:  Nothing to add from vascular standpoint. Dr. Lajoyce Corners was following toe. If he is unable to perform amputation once cleared medically, please let my office know and we can try schedule.  Waverly Ferrari, MD, FACS Beeper [phone] Office: [phone]

## 2014-12-03 NOTE — Progress Notes (Signed)
ANTIMICROBIAL CONSULT NOTE  Pharmacy Consult for Heparin/vancomycin Indication: dry gangrene and cellulitis  No Known Allergies  Patient Measurements: Weight: 129 lb 6.6 oz (58.7 kg)  Vital Signs: Temp: 98.3 F (36.8 C) (09/07 1345) Temp Source: Oral (09/07 1345) BP: 154/82 mmHg (09/07 1345) Pulse Rate: 90 (09/07 1345)  Labs:  Recent Labs  12/01/14 0751 12/01/14 1000  12/02/14 0145 12/02/14 1133 12/03/14 0445 12/03/14 0534  HGB 9.7*  --   --  10.6*  --   --  9.8*  HCT 30.3*  --   --  31.9*  --   --  30.4*  PLT 228  --   --  267  --   --  276  LABPROT 18.1*  --   --  17.7*  --  16.7*  --   INR 1.49  --   --  1.45  --  1.34  --   HEPARINUNFRC 0.74*  --   < > 0.49 0.73* 0.32  --   CREATININE  --  0.86  --   --   --   --   --   < > = values in this interval not displayed.  Estimated Creatinine Clearance: 36.3 mL/min (by C-G formula based on Cr of 0.86).   Assessment: 81 yo female on vancomycin day #6 and ceftriaxone day #5 for dry gangrene of R great toe. Vascular surgery is recommending amputation - scheduled for tomorrow. VT scheduled for 1730 (drawn at 1700), however patient's vancomycin dosing time was pushed back to 2300 starting last night. VT this afternoon is 18 - higher goal should be ok with gangrenous wound. SCr improving, 1.3 on admit>>0.86.  Vancomycin 9/2 >>  Zosyn 9/2>>9/3  9/3 Rocephin>>   9/2 blood>> ngtd  9/3 blood>> ngtd VT 9/7: 18  Goal of Therapy:  VT 15-20   Plan:  -Continue vancomycin 750/24h -Watch renal fx and cultures -F/u abx plan/LOT, VT as indicated   Babs Bertin, PharmD Clinical Pharmacist Pager [phone] 12/03/2014 5:56 PM

## 2014-12-04 ENCOUNTER — Encounter (HOSPITAL_COMMUNITY)
Admission: EM | Disposition: A | Discharge status: Hospice | Payer: Self-pay | Source: Home / Self Care | Attending: Internal Medicine

## 2014-12-04 ENCOUNTER — Inpatient Hospital Stay (HOSPITAL_COMMUNITY): Payer: Medicare Other | Admitting: Anesthesiology

## 2014-12-04 ENCOUNTER — Encounter (HOSPITAL_COMMUNITY): Payer: Self-pay | Admitting: Certified Registered"

## 2014-12-04 DIAGNOSIS — L03115 Cellulitis of right lower limb: Secondary | ICD-10-CM

## 2014-12-04 HISTORY — PX: AMPUTATION: SHX166

## 2014-12-04 LAB — BASIC METABOLIC PANEL
Anion gap: 8 (ref 5–15)
BUN: 11 mg/dL (ref 6–20)
CHLORIDE: 99 mmol/L — AB (ref 101–111)
CO2: 26 mmol/L (ref 22–32)
Calcium: 9.6 mg/dL (ref 8.9–10.3)
Creatinine, Ser: 0.8 mg/dL (ref 0.44–1.00)
GFR calc Af Amer: >60 mL/min (ref >60)
GFR calc non Af Amer: >60 mL/min (ref >60)
GLUCOSE: 122 mg/dL — AB (ref 65–99)
POTASSIUM: 4.4 mmol/L (ref 3.5–5.1)
Sodium: 133 mmol/L — ABNORMAL LOW (ref 135–145)

## 2014-12-04 LAB — CBC
HCT: 31.8 % — ABNORMAL LOW (ref 36.0–46.0)
HEMATOCRIT: 31.4 % — AB (ref 36.0–46.0)
Hemoglobin: 10.1 g/dL — ABNORMAL LOW (ref 12.0–15.0)
Hemoglobin: 10.1 g/dL — ABNORMAL LOW (ref 12.0–15.0)
MCH: 30.7 pg (ref 26.0–34.0)
MCH: 30.9 pg (ref 26.0–34.0)
MCHC: 31.8 g/dL (ref 30.0–36.0)
MCHC: 32.2 g/dL (ref 30.0–36.0)
MCV: 96.7 fL (ref 78.0–100.0)
MCV: 96 fL (ref 78.0–100.0)
PLATELETS: 282 10*3/uL (ref 150–400)
PLATELETS: 313 10*3/uL (ref 150–400)
RBC: 3.29 MIL/uL — ABNORMAL LOW (ref 3.87–5.11)
RBC: 3.27 MIL/uL — ABNORMAL LOW (ref 3.87–5.11)
RDW: 13.5 % (ref 11.5–15.5)
RDW: 13.6 % (ref 11.5–15.5)
WBC: 16.5 10*3/uL — AB (ref 4.0–10.5)
WBC: 17.5 10*3/uL — AB (ref 4.0–10.5)

## 2014-12-04 LAB — CULTURE, BLOOD (ROUTINE X 2)
CULTURE: NO GROWTH
Culture: NO GROWTH

## 2014-12-04 LAB — ABO/RH: ABO/RH(D): A POS

## 2014-12-04 LAB — HEPARIN LEVEL (UNFRACTIONATED)
HEPARIN UNFRACTIONATED: 0.26 [IU]/mL — AB (ref 0.30–0.70)
Heparin Unfractionated: 0.33 IU/mL (ref 0.30–0.70)

## 2014-12-04 LAB — PROTIME-INR
INR: 1.34 (ref 0.00–1.49)
PROTHROMBIN TIME: 16.7 s — AB (ref 11.6–15.2)

## 2014-12-04 SURGERY — AMPUTATION BELOW KNEE
Anesthesia: Monitor Anesthesia Care | Site: Leg Lower | Laterality: Right

## 2014-12-04 MED ORDER — SODIUM CHLORIDE 0.9 % IV SOLN
INTRAVENOUS | Status: DC
  Administered 2014-12-04: 21:00:00 via INTRAVENOUS

## 2014-12-04 MED ORDER — ONDANSETRON HCL 4 MG/2ML IJ SOLN
INTRAMUSCULAR | Status: AC
  Filled 2014-12-04: qty 2

## 2014-12-04 MED ORDER — LIDOCAINE HCL (CARDIAC) 20 MG/ML IV SOLN
INTRAVENOUS | Status: AC
  Filled 2014-12-04: qty 5

## 2014-12-04 MED ORDER — PROPOFOL 10 MG/ML IV BOLUS
INTRAVENOUS | Status: AC
  Filled 2014-12-04: qty 20

## 2014-12-04 MED ORDER — ARTIFICIAL TEARS OP OINT
TOPICAL_OINTMENT | OPHTHALMIC | Status: AC
  Filled 2014-12-04: qty 3.5

## 2014-12-04 MED ORDER — ACETAMINOPHEN 160 MG/5ML PO SOLN: 325.0000 mg | ORAL | Status: DC | PRN

## 2014-12-04 MED ORDER — FENTANYL CITRATE (PF) 100 MCG/2ML IJ SOLN
INTRAMUSCULAR | Status: DC | PRN
  Administered 2014-12-04 (×2): 25 ug via INTRAVENOUS

## 2014-12-04 MED ORDER — ESMOLOL HCL 10 MG/ML IV SOLN
INTRAVENOUS | Status: DC | PRN
Start: 2014-12-04 — End: 2014-12-04
  Administered 2014-12-04 (×2): 30 mg via INTRAVENOUS

## 2014-12-04 MED ORDER — FENTANYL CITRATE (PF) 100 MCG/2ML IJ SOLN
INTRAMUSCULAR | Status: AC
  Filled 2014-12-04: qty 2

## 2014-12-04 MED ORDER — ROCURONIUM BROMIDE 50 MG/5ML IV SOLN
INTRAVENOUS | Status: AC
  Filled 2014-12-04: qty 1

## 2014-12-04 MED ORDER — NEOSTIGMINE METHYLSULFATE 10 MG/10ML IV SOLN
INTRAVENOUS | Status: AC
  Filled 2014-12-04: qty 1

## 2014-12-04 MED ORDER — OXYCODONE HCL 5 MG PO TABS: 5.0000 mg | ORAL_TABLET | Freq: Once | ORAL | Status: DC | PRN

## 2014-12-04 MED ORDER — FENTANYL CITRATE (PF) 100 MCG/2ML IJ SOLN
25.0000 ug | INTRAMUSCULAR | Status: DC | PRN
  Administered 2014-12-05: 25 ug via INTRAVENOUS
  Administered 2014-12-06: 50 ug via INTRAVENOUS
  Filled 2014-12-04 (×2): qty 2

## 2014-12-04 MED ORDER — LIDOCAINE-EPINEPHRINE (PF) 1.5 %-1:200000 IJ SOLN
INTRAMUSCULAR | Status: DC | PRN
  Administered 2014-12-04: 7 mL via PERINEURAL
  Administered 2014-12-04: 5 mL via PERINEURAL

## 2014-12-04 MED ORDER — ACETAMINOPHEN 325 MG PO TABS
325.0000 mg | ORAL_TABLET | ORAL | Status: DC | PRN
  Administered 2014-12-06: 325 mg via ORAL
  Filled 2014-12-04: qty 2
  Filled 2014-12-04: qty 1

## 2014-12-04 MED ORDER — FENTANYL CITRATE (PF) 250 MCG/5ML IJ SOLN
INTRAMUSCULAR | Status: AC
  Filled 2014-12-04: qty 5

## 2014-12-04 MED ORDER — GLYCOPYRROLATE 0.2 MG/ML IJ SOLN
INTRAMUSCULAR | Status: AC
  Filled 2014-12-04: qty 3

## 2014-12-04 MED ORDER — KETAMINE HCL 100 MG/ML IJ SOLN
INTRAMUSCULAR | Status: AC
  Filled 2014-12-04: qty 1

## 2014-12-04 MED ORDER — ROPIVACAINE HCL 5 MG/ML IJ SOLN
INTRAMUSCULAR | Status: DC | PRN
  Administered 2014-12-04: 15 mL via PERINEURAL
  Administered 2014-12-04: 20 mL via PERINEURAL

## 2014-12-04 MED ORDER — OXYCODONE HCL 5 MG/5ML PO SOLN: 5.0000 mg | Freq: Once | ORAL | Status: DC | PRN

## 2014-12-04 MED ORDER — FENTANYL CITRATE (PF) 250 MCG/5ML IJ SOLN
INTRAMUSCULAR | Status: AC
Start: 2014-12-04 — End: 2014-12-04
  Filled 2014-12-04: qty 5

## 2014-12-04 MED ORDER — 0.9 % SODIUM CHLORIDE (POUR BTL) OPTIME
TOPICAL | Status: DC | PRN
  Administered 2014-12-04: 1000 mL

## 2014-12-04 MED ORDER — LACTATED RINGERS IV SOLN
INTRAVENOUS | Status: DC
  Administered 2014-12-04: 17:00:00 via INTRAVENOUS

## 2014-12-04 SURGICAL SUPPLY — 38 items
BLADE SAW RECIP 87.9 MT (BLADE) ×3 IMPLANT
BLADE SURG 21 STRL SS (BLADE) ×3 IMPLANT
BNDG COHESIVE 6X5 TAN STRL LF (GAUZE/BANDAGES/DRESSINGS) ×3 IMPLANT
BNDG GAUZE ELAST 4 BULKY (GAUZE/BANDAGES/DRESSINGS) ×3 IMPLANT
COVER SURGICAL LIGHT HANDLE (MISCELLANEOUS) ×6 IMPLANT
CUFF TOURNIQUET SINGLE 34IN LL (TOURNIQUET CUFF) ×3 IMPLANT
CUFF TOURNIQUET SINGLE 44IN (TOURNIQUET CUFF) IMPLANT
DRAPE EXTREMITY T 121X128X90 (DRAPE) ×3 IMPLANT
DRAPE PROXIMA HALF (DRAPES) ×6 IMPLANT
DRAPE U-SHAPE 47X51 STRL (DRAPES) ×3 IMPLANT
DRSG ADAPTIC 3X8 NADH LF (GAUZE/BANDAGES/DRESSINGS) ×3 IMPLANT
DRSG PAD ABDOMINAL 8X10 ST (GAUZE/BANDAGES/DRESSINGS) ×3 IMPLANT
DURAPREP 26ML APPLICATOR (WOUND CARE) ×3 IMPLANT
ELECT REM PT RETURN 9FT ADLT (ELECTROSURGICAL) ×3
ELECTRODE REM PT RTRN 9FT ADLT (ELECTROSURGICAL) ×1 IMPLANT
GAUZE SPONGE 4X4 12PLY STRL (GAUZE/BANDAGES/DRESSINGS) ×3 IMPLANT
GLOVE BIOGEL PI IND STRL 9 (GLOVE) ×1 IMPLANT
GLOVE BIOGEL PI INDICATOR 9 (GLOVE) ×2
GLOVE SURG ORTHO 9.0 STRL STRW (GLOVE) ×3 IMPLANT
GOWN STRL REUS W/ TWL XL LVL3 (GOWN DISPOSABLE) ×2 IMPLANT
GOWN STRL REUS W/TWL XL LVL3 (GOWN DISPOSABLE) ×4
KIT BASIN OR (CUSTOM PROCEDURE TRAY) ×3 IMPLANT
KIT ROOM TURNOVER OR (KITS) ×3 IMPLANT
MANIFOLD NEPTUNE II (INSTRUMENTS) ×3 IMPLANT
NS IRRIG 1000ML POUR BTL (IV SOLUTION) ×3 IMPLANT
PACK GENERAL/GYN (CUSTOM PROCEDURE TRAY) ×3 IMPLANT
PAD ARMBOARD 7.5X6 YLW CONV (MISCELLANEOUS) ×6 IMPLANT
SPONGE LAP 18X18 X RAY DECT (DISPOSABLE) IMPLANT
STAPLER VISISTAT 35W (STAPLE) IMPLANT
STOCKINETTE IMPERVIOUS LG (DRAPES) ×3 IMPLANT
SUT SILK 2 0 (SUTURE) ×2
SUT SILK 2-0 18XBRD TIE 12 (SUTURE) ×1 IMPLANT
SUT VIC AB 1 CT1 27 (SUTURE) ×4
SUT VIC AB 1 CT1 27XBRD ANBCTR (SUTURE) ×2 IMPLANT
SUT VIC AB 1 CTX 27 (SUTURE) ×6 IMPLANT
TOWEL OR 17X24 6PK STRL BLUE (TOWEL DISPOSABLE) ×3 IMPLANT
TOWEL OR 17X26 10 PK STRL BLUE (TOWEL DISPOSABLE) ×3 IMPLANT
WATER STERILE IRR 1000ML POUR (IV SOLUTION) ×3 IMPLANT

## 2014-12-04 NOTE — Progress Notes (Signed)
[facility] PROGRESS NOTE Assessment/Plan: Sepsis due to cellulitis with Gangrene of foot: -continue empirically on vancomycin and Rocephin  -Vascular surgery was consulted, who recommended a CT angiogram of right lower extremity on 9.4.2016, she is not a candidate for revascularization  -per ortho plan is for right BKA today 9/8 -continue supportive care and PRN analgesics  Preop Evaluation: - Due to age and multiple comorbidities, chronic kidney disease, atrial fibrillation on Coumadin, aortic stenosis   She is high risk for cardiopulmonary complications. Family and patient understand and they would like to proceed with surgery. -2-D echo demonstrating worsening on aortic stenosis in compare to echo in 2004; preserved EF, no wall motion abnormalities  -surgery anticipated later today 12/04/14  Long term current use of anticoagulant therapy/  Paroxysmal atrial fibrillation: -On Iv heparin in anticipation for surgery -CHADS VASC2 score is 6. -will resume coumadin once ok with ortho   Chronic kidney disease stage 3-4: -Creatinine at 1.2-1.3 -essentially at baseline -will monitor trend -pharmacy helping with medication adjustment  Essential hypertension: -Continue metoprolol -Ace on hold due to renal function -BP is fair controlled -pain contributing to intermittent high readings  -will monitor  Severe protein caloric malnutrition: Continue Ensure 3 times a day.  Code Status:DNR/DNI Family Communication: daughter  Disposition Plan: remains inpatient; surgery anticipated for tomorrow 9/8 (after 5pm); SNF at discharge anticipated    Consultants:  Ortho (Dr. Lajoyce Corners)  Vascular surgery  (Dr. Edilia Bo)  Procedures:  CXR  Right foot x-ray  Antibiotics:  vanc and rocephin  HPI/Subjective: No fever, no CP, no SOB. Patient reports to be ready for surgery   Objective: Filed Vitals:   12/03/14 0559 12/03/14 1345 12/03/14 1959 12/04/14 0444  BP: 163/91 154/82  159/85 159/91  Pulse: 87 90 92 96  Temp: 98.2 F (36.8 C) 98.3 F (36.8 C) 98.7 F (37.1 C) 98.6 F (37 C)  TempSrc: Oral Oral Oral Axillary  Resp: Weight:      SpO2: 100% 99% 99% 100%    Intake/Output Summary (Last 24 hours) at 12/04/14 1327 Last data filed at 12/04/14 0616  Gross per 24 hour  Intake    320 ml  Output    450 ml  Net   -130 ml   Filed Weights   11/28/14 2237  Weight: 58.7 kg (129 lb 6.6 oz)    Exam:  General: Alert, awake, oriented x3, in no acute distress. Cachectic, frail and chronically ill in appearance  HEENT: No bruits, no goiter, no JVD Heart: positive SEM, no rubs, no gallops, rate controlled; A. Fib per telemetry  Lungs: Good air movement, no crackles, no wheezing Abdomen: Soft, nontender, nondistended, positive bowel sounds.  Neuro: Grossly intact, nonfocal. MSK: right foot with signs of gangrene affecting great toe, trace edema bilaterally   Data Reviewed: Basic Metabolic Panel:  Recent Labs Lab 11/29/14 0335 11/29/14 1116 12/01/14 1000 12/03/14 2045 12/04/14 0439  NA 133* 132* 135 132* 133*  K 4.1 3.9 4.2 4.3 4.4  CL 97* 99* 103 97* 99*  CO2 GLUCOSE 124* 98 87 165* 122*  BUN 34* 33* CREATININE 1.30* 1.22* 0.86 0.88 0.80  CALCIUM 9.2 8.8* 9.4 9.5 9.6   Liver Function Tests:  Recent Labs Lab 11/28/14 1834 11/29/14 0335 11/29/14 1116 12/03/14 2045  AST ALT ALKPHOS 65 65 57 62  BILITOT 0.9 0.9 0.8 0.8  PROT 7.0 6.8 6.1* 6.2*  ALBUMIN 2.8* 2.6* 2.4* 2.4*   CBC:  Recent Labs Lab 11/28/14 1834  11/29/14 1154 12/01/14 0751 12/02/14 0145 12/03/14 0534 12/03/14 2045 12/04/14 0439  WBC 11.0*  < > 14.6* 12.1* 13.3* 15.3* 16.9* 16.5*  NEUTROABS 8.7*  --  13.0*  --   --   --  14.8*  --   HGB 10.6*  < > 9.5* 9.7* 10.6* 9.8* 10.0* 10.1*  HCT 31.8*  < > 29.9* 30.3* 31.9* 30.4* 30.9* 31.8*  MCV 100.6*  < > 98.0 98.4 97.9 96.2 96.3 96.7  PLT 228  < > 226 228  267 276 289 282  < > = values in this interval not displayed. Cardiac Enzymes: No results for input(s): CKTOTAL, CKMB, CKMBINDEX, TROPONINI in the last 168 hours. BNP (last 3 results) No results for input(s): BNP in the last 8760 hours.  ProBNP (last 3 results) No results for input(s): PROBNP in the last 8760 hours.  CBG: No results for input(s): GLUCAP in the last 168 hours.  Recent Results (from the past 240 hour(s))  Blood culture (routine x 2)     Status: None   Collection Time: 11/28/14  7:25 PM  Result Value Ref Range Status   Specimen Description BLOOD LEFT HAND  Final   Special Requests BOTTLES DRAWN AEROBIC ONLY 2CC  Final   Culture NO GROWTH 5 DAYS  Final   Report Status 12/03/2014 FINAL  Final  Blood culture (routine x 2)     Status: None   Collection Time: 11/28/14  7:39 PM  Result Value Ref Range Status   Specimen Description BLOOD LEFT ARM  Final   Special Requests BOTTLES DRAWN AEROBIC ONLY 3CC  Final   Culture NO GROWTH 5 DAYS  Final   Report Status 12/03/2014 FINAL  Final  MRSA PCR Screening     Status: None   Collection Time: 11/29/14 12:16 AM  Result Value Ref Range Status   MRSA by PCR NEGATIVE NEGATIVE Final    Comment:        The GeneXpert MRSA Assay (FDA approved for NASAL specimens only), is one component of a comprehensive MRSA colonization surveillance program. It is not intended to diagnose MRSA infection nor to guide or monitor treatment for MRSA infections.   Culture, blood (x 2)     Status: None (Preliminary result)   Collection Time: 11/29/14  9:50 AM  Result Value Ref Range Status   Specimen Description BLOOD LEFT ANTECUBITAL  Final   Special Requests BOTTLES DRAWN AEROBIC ONLY 4CC  Final   Culture NO GROWTH 4 DAYS  Final   Report Status PENDING  Incomplete  Culture, blood (x 2)     Status: None (Preliminary result)   Collection Time: 11/29/14 10:00 AM  Result Value Ref Range Status   Specimen Description BLOOD BLOOD LEFT HAND   Final   Special Requests BOTTLES DRAWN AEROBIC ONLY 7CC  Final   Culture NO GROWTH 4 DAYS  Final   Report Status PENDING  Incomplete     Studies: No results found.  Scheduled Meds: . atorvastatin  10 mg Oral q1800  . cefTRIAXone (ROCEPHIN)  IV  2 g Intravenous Q24H  . feeding supplement (ENSURE ENLIVE)  237 mL Oral TID BM  . metoprolol  75 mg Oral BID  . polyethylene glycol  17 g Oral Daily  . saccharomyces boulardii  250 mg Oral BID  . vancomycin  750 mg Intravenous Q24H   Continuous Infusions: .  sodium chloride 1 mL (12/04/14 0641)  . heparin 800 Units/hr (12/02/14 1352)    Time Spent:  25 min   Vassie Loll  [facility] Pager [phone].  If 7PM-7AM, please contact night-coverage at [email or web address], password [email or web address] 12/04/2014, 1:27 PM  LOS: 6 days

## 2014-12-04 NOTE — Op Note (Signed)
   Date of Surgery: 12/04/2014  INDICATIONS: Judy Grant is a 81 y.o.-year-old female who has gangrene of the right forefoot. Patient has undergone vascular evaluation and is not a revascularization candidate due to the progression of gangrene patient presents at this time for transtibial amputation.Marland Kitchen  PREOPERATIVE DIAGNOSIS: Gangrene right foot  POSTOPERATIVE DIAGNOSIS: Same.  PROCEDURE: Transtibial amputation right  SURGEON: Lajoyce Corners, M.D.  ANESTHESIA:  general  IV FLUIDS AND URINE: See anesthesia.  ESTIMATED BLOOD LOSS: Minimal mL.  COMPLICATIONS: None.  DESCRIPTION OF PROCEDURE: The patient was brought to the operating room and underwent a general anesthetic. After adequate levels of anesthesia were obtained patient's lower extremity was prepped using DuraPrep draped into a sterile field. A timeout was called.  A transverse incision was made 11 cm distal to the tibial tubercle. This curved proximally and a large posterior flap was created. The tibia was transected 1 cm proximal to the skin incision. The fibula was transected just proximal to the tibial incision. The tibia was beveled anteriorly. A large posterior flap was created. The sciatic nerve was pulled cut and allowed to retract. The vascular bundles were suture ligated with 2-0 silk. The deep and superficial fascial layers were closed using #1 Vicryl. The skin was closed using staples and 2-0 nylon. The wound was covered with Adaptic orthopedic sponges AB dressing Kerlix and Coban. Patient was extubated taken to the PACU in stable condition.  Judy Baker, MD [facility] 7:36 PM

## 2014-12-04 NOTE — Anesthesia Procedure Notes (Addendum)
Anesthesia Regional Block:  Popliteal block  Pre-Anesthetic Checklist: ,, timeout performed, Correct Patient, Correct Site, Correct Laterality, Correct Procedure, Correct Position, site marked, Risks and benefits discussed,  Surgical consent,  Pre-op evaluation,  At surgeon's request and post-op pain management  Laterality: Lower and Right  Prep: chloraprep       Needles:  Injection technique: Single-shot  Needle Type: Echogenic Stimulator Needle          Additional Needles:  Procedures: ultrasound guided (picture in chart) and nerve stimulator Popliteal block  Nerve Stimulator or Paresthesia:  Response: plantar, 0.9 mA,   Additional Responses:   Narrative:  Injection made incrementally with aspirations every 5 mL.  Performed by: Personally  Anesthesiologist: MOSER, CHRISTOPHER  Additional Notes: H+P and labs reviewed, risks and benefits discussed with patient, procedure tolerated well without complications   Anesthesia Regional Block:  Femoral nerve block  Pre-Anesthetic Checklist: ,, timeout performed, Correct Patient, Correct Site, Correct Laterality, Correct Procedure, Correct Position, site marked, Risks and benefits discussed,  Surgical consent,  Pre-op evaluation,  At surgeon's request and post-op pain management  Laterality: Lower and Right  Prep: chloraprep       Needles:  Injection technique: Single-shot  Needle Type: Echogenic Stimulator Needle          Additional Needles:  Procedures: ultrasound guided (picture in chart) and nerve stimulator Femoral nerve block  Nerve Stimulator or Paresthesia:  Response: quad, 0.5 mA,   Additional Responses:   Narrative:  Injection made incrementally with aspirations every 5 mL.  Performed by: Personally  Anesthesiologist: MOSER, CHRISTOPHER  Additional Notes: H+P and labs reviewed, risks and benefits discussed with patient, procedure tolerated well without complications   Procedure Name:  MAC Date/Time: 12/04/2014 7:00 PM Performed by: Arlice Colt B Pre-anesthesia Checklist: Patient identified, Emergency Drugs available, Suction available, Patient being monitored and Timeout performed Patient Re-evaluated:Patient Re-evaluated prior to inductionOxygen Delivery Method: Nasal cannula

## 2014-12-04 NOTE — Anesthesia Preprocedure Evaluation (Addendum)
Anesthesia Evaluation  Patient identified by MRN, date of birth, ID bandGeneral Assessment Comment:Awake, responds to pain  Reviewed: Allergy & Precautions, NPO status , Patient's Chart, lab work & pertinent test results, reviewed documented beta blocker date and time   History of Anesthesia Complications Negative for: history of anesthetic complications  Airway Mallampati: II  TM Distance: >3 FB Neck ROM: Full    Dental  (+) Edentulous Upper, Edentulous Lower   Pulmonary neg shortness of breath, neg sleep apnea, neg COPD, neg recent URI, former smoker, neg PE   + rhonchi        Cardiovascular hypertension, Pt. on medications and Pt. on home beta blockers + Past MI and +CHF  + dysrhythmias Atrial Fibrillation + Valvular Problems/Murmurs (Mod-severe AS. Mod MS. NL EF) AS and MR  Rhythm:Irregular + Systolic murmurs Moderate to severe AS, Moderate TR and MS, mild MR, nl ef   Neuro/Psych CVA negative psych ROS   GI/Hepatic negative GI ROS, Neg liver ROS,   Endo/Other  negative endocrine ROS  Renal/GU negative Renal ROS     Musculoskeletal  (+) Arthritis ,   Abdominal   Peds  Hematology  (+) anemia ,   Anesthesia Other Findings   Reproductive/Obstetrics                           Anesthesia Physical Anesthesia Plan  ASA: III  Anesthesia Plan: MAC and Regional   Post-op Pain Management:    Induction: Intravenous  Airway Management Planned: Nasal Cannula  Additional Equipment: Arterial line  Intra-op Plan:   Post-operative Plan: Extubation in OR  Informed Consent: I have reviewed the patients History and Physical, chart, labs and discussed the procedure including the risks, benefits and alternatives for the proposed anesthesia with the patient or authorized representative who has indicated his/her understanding and acceptance.   Dental advisory given  Plan Discussed with: CRNA and  Surgeon  Anesthesia Plan Comments:        Anesthesia Quick Evaluation

## 2014-12-04 NOTE — Care Management Important Message (Signed)
Important Message  Patient Details  Name: NASTASSJA WITKOP MRN: [id number] Date of Birth: 1917/10/25   Medicare Important Message Given:  Yes-third notification given    Kyla Balzarine 12/04/2014, 9:53 AMImportant Message  Patient Details  Name: WANDY BOSSLER MRN: [id number] Date of Birth: 23-Dec-1917   Medicare Important Message Given:  Yes-third notification given    Kyla Balzarine 12/04/2014, 9:53 AM

## 2014-12-04 NOTE — Progress Notes (Signed)
UR Completed. Kanani Mowbray, RN, BSN.  [phone] 

## 2014-12-04 NOTE — Transfer of Care (Signed)
Immediate Anesthesia Transfer of Care Note  Patient: Judy Grant  Procedure(s) Performed: Procedure(s): AMPUTATION BELOW KNEE (Right)  Patient Location: PACU  Anesthesia Type:MAC  Level of Consciousness: confused  Airway & Oxygen Therapy: Patient Spontanous Breathing  Post-op Assessment: Report given to RN and Post -op Vital signs reviewed and stable  Post vital signs: Reviewed and stable  Last Vitals:  Filed Vitals:   12/04/14 1940  BP: 138/90  Pulse: 129  Temp: 36.5 C  Resp: 15    Complications: No apparent anesthesia complications

## 2014-12-04 NOTE — Progress Notes (Signed)
CSW spoke with pt daughter on the phone and informed of bed offers.  CSW will continue to follow- dtr to investigate facilities and determine if she would like to move the pt to a different facility  Merlyn Lot, [facility] Clinical Social Worker [phone]

## 2014-12-04 NOTE — Progress Notes (Signed)
ANTICOAGULATION CONSULT NOTE - Follow Up Consult  Pharmacy Consult for heparin  Indication: atrial fibrillation  No Known Allergies  Patient Measurements: Weight: 129 lb 6.6 oz (58.7 kg)   Vital Signs: Temp: 98.6 F (37 C) (09/08 0444) Temp Source: Axillary (09/08 0444) BP: 159/91 mmHg (09/08 0444) Pulse Rate: 96 (09/08 0444)  Labs:  Recent Labs  12/02/14 1133 12/03/14 0445 12/03/14 0534 12/03/14 2045 12/04/14 0439 12/04/14 1124  HGB  --   --  9.8* 10.0* 10.1*  --   HCT  --   --  30.4* 30.9* 31.8*  --   PLT  --   --  276 289 282  --   LABPROT  --  16.7*  --  15.8* 16.7*  --   INR  --  1.34  --  1.25 1.34  --   HEPARINUNFRC 0.73* 0.32  --   --   --  0.26*  CREATININE  --   --   --  0.88 0.80  --     Estimated Creatinine Clearance: 39 mL/min (by C-G formula based on Cr of 0.8).   Medications:  Scheduled:  . atorvastatin  10 mg Oral q1800  . cefTRIAXone (ROCEPHIN)  IV  2 g Intravenous Q24H  . feeding supplement (ENSURE ENLIVE)  237 mL Oral TID BM  . metoprolol  75 mg Oral BID  . polyethylene glycol  17 g Oral Daily  . saccharomyces boulardii  250 mg Oral BID  . vancomycin  750 mg Intravenous Q24H   Infusions:  . sodium chloride 1 mL (12/04/14 0641)  . heparin 800 Units/hr (12/02/14 1352)    Assessment: 81 yo female on coumadin PTA for afib and currently on heparin as plans for R great toe amputation today at about 5pm. Heparin level is 0.26 and below goal.   Goal of Therapy:  Heparin level 0.3-0.7 units/ml Monitor platelets by anticoagulation protocol: Yes   Plan:  -No change in heparin as patient is for OR later today -Will follow anticoagulation plans post OR  Harland German, Pharm D 12/04/2014 1:27 PM

## 2014-12-04 NOTE — Interval H&P Note (Signed)
History and Physical Interval Note:  12/04/2014 6:44 AM  Judy Grant  has presented today for surgery, with the diagnosis of Gangrene Right Foot  The various methods of treatment have been discussed with the patient and family. After consideration of risks, benefits and other options for treatment, the patient has consented to  Procedure(s): AMPUTATION BELOW KNEE (Right) as a surgical intervention .  The patient's history has been reviewed, patient examined, no change in status, stable for surgery.  I have reviewed the patient's chart and labs.  Questions were answered to the patient's satisfaction.     DUDA,MARCUS V

## 2014-12-04 NOTE — Progress Notes (Signed)
Report called to [id number] preop sx

## 2014-12-04 NOTE — Progress Notes (Signed)
No sedation used for blocks done by Dr. Maple Hudson.

## 2014-12-04 NOTE — Progress Notes (Signed)
2 rings given to dau.  Twanna Hy

## 2014-12-04 NOTE — H&P (View-Only) (Signed)
Patient ID: Judy Grant, female   DOB: 08/02/1917, 81 y.o.   MRN: [id number] Patient with persistent dry gangrenous changes of her right foot. I discussed with vascular surgery and patient is not a revascularization candidate. We will plan for a transtibial amputation tomorrow as an add-on surgery. I discussed this with the patient's son. He states he understands and wishes to proceed with surgery. Surgery most likely after 5 PM on Thursday. 

## 2014-12-04 NOTE — Anesthesia Postprocedure Evaluation (Signed)
  Anesthesia Post-op Note  Patient: Judy Grant  Procedure(s) Performed: Procedure(s): AMPUTATION BELOW KNEE (Right)  Patient Location: PACU  Anesthesia Type:Regional  Level of Consciousness: awake, alert , patient cooperative and responds to stimulation  Airway and Oxygen Therapy: Patient Spontanous Breathing and Patient connected to nasal cannula oxygen  Post-op Pain: none  Post-op Assessment: Post-op Vital signs reviewed, Patient's Cardiovascular Status Stable, Respiratory Function Stable, Patent Airway, No signs of Nausea or vomiting and Pain level controlled              Post-op Vital Signs: Reviewed and stable  Last Vitals:  Filed Vitals:   12/04/14 2009  BP:   Pulse:   Temp: 36.7 C  Resp:     Complications: No apparent anesthesia complications

## 2014-12-05 ENCOUNTER — Encounter (HOSPITAL_COMMUNITY): Payer: Self-pay | Admitting: Orthopedic Surgery

## 2014-12-05 DIAGNOSIS — T8743 Infection of amputation stump, right lower extremity: Secondary | ICD-10-CM

## 2014-12-05 LAB — CBC
HEMATOCRIT: 26.6 % — AB (ref 36.0–46.0)
Hemoglobin: 8.8 g/dL — ABNORMAL LOW (ref 12.0–15.0)
MCH: 32 pg (ref 26.0–34.0)
MCHC: 33.1 g/dL (ref 30.0–36.0)
MCV: 96.7 fL (ref 78.0–100.0)
PLATELETS: 256 10*3/uL (ref 150–400)
RBC: 2.75 MIL/uL — ABNORMAL LOW (ref 3.87–5.11)
RDW: 13.9 % (ref 11.5–15.5)
WBC: 13.1 10*3/uL — AB (ref 4.0–10.5)

## 2014-12-05 LAB — POCT I-STAT 4, (NA,K, GLUC, HGB,HCT)
Glucose, Bld: 106 mg/dL — ABNORMAL HIGH (ref 65–99)
HEMATOCRIT: 33 % — AB (ref 36.0–46.0)
HEMOGLOBIN: 11.2 g/dL — AB (ref 12.0–15.0)
Potassium: 4.4 mmol/L (ref 3.5–5.1)
SODIUM: 134 mmol/L — AB (ref 135–145)

## 2014-12-05 LAB — OCCULT BLOOD X 1 CARD TO LAB, STOOL: FECAL OCCULT BLD: NEGATIVE

## 2014-12-05 LAB — HEPARIN LEVEL (UNFRACTIONATED): Heparin Unfractionated: 0.23 IU/mL — ABNORMAL LOW (ref 0.30–0.70)

## 2014-12-05 MED ORDER — WARFARIN - PHARMACIST DOSING INPATIENT
Freq: Every day | Status: DC
  Administered 2014-12-05 – 2014-12-06 (×2)

## 2014-12-05 MED ORDER — HEPARIN (PORCINE) IN NACL 100-0.45 UNIT/ML-% IJ SOLN
900.0000 [IU]/h | INTRAMUSCULAR | Status: DC
  Administered 2014-12-05: 850 [IU]/h via INTRAVENOUS
  Administered 2014-12-06: 1000 [IU]/h via INTRAVENOUS
  Filled 2014-12-05 (×5): qty 250

## 2014-12-05 MED ORDER — WARFARIN SODIUM 5 MG PO TABS
5.0000 mg | ORAL_TABLET | Freq: Once | ORAL | Status: AC
  Administered 2014-12-05: 5 mg via ORAL
  Filled 2014-12-05: qty 1

## 2014-12-05 NOTE — Progress Notes (Signed)
[facility] PROGRESS NOTE Assessment/Plan: Sepsis due to cellulitis with Gangrene of foot: -continue empirically on vancomycin and Rocephin for another 24 hours after amputation; will then treat for another 10 days with doxycycline twice a day. -Vascular surgery was consulted, who recommended a CT angiogram of right lower extremity on 9.4.2016, she is not a candidate for revascularization  -Status post right transtibial amputation on 9/8 -continue supportive care and PRN analgesics; patient reports the pain is well controlled currently -No fever  Preop Evaluation: - Due to age and multiple comorbidities, chronic kidney disease, atrial fibrillation on Coumadin, aortic stenosis   She is high risk for cardiopulmonary complications. Family and patient understand and they would like to proceed with surgery. -2-D echo demonstrating worsening on aortic stenosis in compare to echo in 2004; preserved EF, no wall motion abnormalities  -tolerated surgery without significant complication  Long term current use of anticoagulant therapy/  Paroxysmal atrial fibrillation: -CHADS VASC2 score is 6. -Ok to resume headbutting/Coumadin per orthopedic recommendations -Pharmacy helping [place] with dose adjustment  Chronic kidney disease stage 3-4: -Creatinine at 1.2-1.3 -essentially at baseline -will monitor trend -pharmacy helping with medication adjustment for her renal function  Essential hypertension: -Continue metoprolol -Will continue holding Ace -BP is well controlled now -pain contributing to intermittent high readings  -will monitor  Severe protein caloric malnutrition: Continue Ensure 3 times a day.  Acute blood loss anemia -Not need any transfusion currently -Will follow hemoglobin trend and transfuse as needed  Code Status:DNR/DNI Family Communication: daughter  Disposition Plan: remains inpatient; surgery anticipated for tomorrow 9/8 (after 5pm); SNF at discharge anticipated      Consultants:  Ortho (Dr. Lajoyce Corners)  Vascular surgery  (Dr. Edilia Bo)  Procedures:  CXR  Right transtibial amputation (9/8)  Antibiotics:  vanc and rocephin  HPI/Subjective: No fever, no CP, no SOB. Patient tolerated surgery well and without significant complications.  Objective: Filed Vitals:   12/04/14 2235 12/05/14 0010 12/05/14 0454 12/05/14 0956  BP: 99/56 135/84 120/61 108/62  Pulse: 80 71 91 100  Temp: 98.7 F (37.1 C)  98.7 F (37.1 C)   TempSrc: Oral  Oral   Resp: 16  17   Weight:      SpO2: 99%  100%     Intake/Output Summary (Last 24 hours) at 12/05/14 1402 Last data filed at 12/05/14 0905  Gross per 24 hour  Intake    987 ml  Output    350 ml  Net    637 ml   Filed Weights   11/28/14 2237  Weight: 58.7 kg (129 lb 6.6 oz)    Exam:  General: Alert, awake, oriented x3, in no acute distress. Cachectic, frail and chronically ill in appearance  HEENT: No bruits, no goiter, no JVD Heart: positive SEM, no rubs, no gallops, rate controlled; A. Fib per telemetry  Lungs: Good air movement, no crackles, no wheezing Abdomen: Soft, nontender, nondistended, positive bowel sounds.  Neuro: Grossly intact, nonfocal. MSK: Status post right transtibial amputation. Dressings in place clean and dry. No swelling and no signs of cyanosis of less necrosis.   Data Reviewed: Basic Metabolic Panel:  Recent Labs Lab 11/29/14 0335 11/29/14 1116 12/01/14 1000 12/03/14 2045 12/04/14 0439 12/04/14 1916  NA 133* 132* 135 132* 133* 134*  K 4.1 3.9 4.2 4.3 4.4 4.4  CL 97* 99* 103 97* 99*  --   CO2 25 24 25 23 26   --   GLUCOSE 124* 98 87 165* 122* 106*  BUN 34*  33* --   CREATININE 1.30* 1.22* 0.86 0.88 0.80  --   CALCIUM 9.2 8.8* 9.4 9.5 9.6  --    Liver Function Tests:  Recent Labs Lab 11/28/14 1834 11/29/14 0335 11/29/14 1116 12/03/14 2045  AST ALT ALKPHOS 65 65 57 62  BILITOT 0.9 0.9 0.8 0.8  PROT 7.0 6.8 6.1* 6.2*   ALBUMIN 2.8* 2.6* 2.4* 2.4*   CBC:  Recent Labs Lab 11/28/14 1834  11/29/14 1154  12/03/14 0534 12/03/14 2045 12/04/14 0439 12/04/14 1915 12/04/14 1916 12/05/14 0347  WBC 11.0*  < > 14.6*  < > 15.3* 16.9* 16.5* 17.5*  --  13.1*  NEUTROABS 8.7*  --  13.0*  --   --  14.8*  --   --   --   --   HGB 10.6*  < > 9.5*  < > 9.8* 10.0* 10.1* 10.1* 11.2* 8.8*  HCT 31.8*  < > 29.9*  < > 30.4* 30.9* 31.8* 31.4* 33.0* 26.6*  MCV 100.6*  < > 98.0  < > 96.2 96.3 96.7 96.0  --  96.7  PLT 228  < > 226  < > 276 289 282 313  --  256  < > = values in this interval not displayed.  CBG: No results for input(s): GLUCAP in the last 168 hours.  Recent Results (from the past 240 hour(s))  Blood culture (routine x 2)     Status: None   Collection Time: 11/28/14  7:25 PM  Result Value Ref Range Status   Specimen Description BLOOD LEFT HAND  Final   Special Requests BOTTLES DRAWN AEROBIC ONLY 2CC  Final   Culture NO GROWTH 5 DAYS  Final   Report Status 12/03/2014 FINAL  Final  Blood culture (routine x 2)     Status: None   Collection Time: 11/28/14  7:39 PM  Result Value Ref Range Status   Specimen Description BLOOD LEFT ARM  Final   Special Requests BOTTLES DRAWN AEROBIC ONLY 3CC  Final   Culture NO GROWTH 5 DAYS  Final   Report Status 12/03/2014 FINAL  Final  MRSA PCR Screening     Status: None   Collection Time: 11/29/14 12:16 AM  Result Value Ref Range Status   MRSA by PCR NEGATIVE NEGATIVE Final    Comment:        The GeneXpert MRSA Assay (FDA approved for NASAL specimens only), is one component of a comprehensive MRSA colonization surveillance program. It is not intended to diagnose MRSA infection nor to guide or monitor treatment for MRSA infections.   Culture, blood (x 2)     Status: None   Collection Time: 11/29/14  9:50 AM  Result Value Ref Range Status   Specimen Description BLOOD LEFT ANTECUBITAL  Final   Special Requests BOTTLES DRAWN AEROBIC ONLY 4CC  Final   Culture NO  GROWTH 5 DAYS  Final   Report Status 12/04/2014 FINAL  Final  Culture, blood (x 2)     Status: None   Collection Time: 11/29/14 10:00 AM  Result Value Ref Range Status   Specimen Description BLOOD BLOOD LEFT HAND  Final   Special Requests BOTTLES DRAWN AEROBIC ONLY 7CC  Final   Culture NO GROWTH 5 DAYS  Final   Report Status 12/04/2014 FINAL  Final     Studies: No results found.  Scheduled Meds: . atorvastatin  10 mg Oral q1800  . cefTRIAXone (  ROCEPHIN)  IV  2 g Intravenous Q24H  . feeding supplement (ENSURE ENLIVE)  237 mL Oral TID BM  . metoprolol  75 mg Oral BID  . polyethylene glycol  17 g Oral Daily  . saccharomyces boulardii  250 mg Oral BID  . vancomycin  750 mg Intravenous Q24H  . warfarin  5 mg Oral ONCE-1800  . Warfarin - Pharmacist Dosing Inpatient   Does not apply q1800   Continuous Infusions: . sodium chloride 1 mL (12/04/14 0641)  . sodium chloride 10 mL/hr at 12/04/14 2122  . heparin 850 Units/hr (12/05/14 0846)  . lactated ringers 10 mL/hr at 12/04/14 1728    Time Spent:  25 min   Vassie Loll  [facility] Pager [phone].  If 7PM-7AM, please contact night-coverage at [email or web address], password [facility] 12/05/2014, 2:02 PM  LOS: 7 days

## 2014-12-05 NOTE — Progress Notes (Signed)
Patient ID: Judy Grant, female   DOB: 06-Jun-1917, 81 y.o.   MRN: [id number] Postoperative day 1 right transtibial amputation. Patient is awake and alert this morning. Dressing clean and dry. Plan for discharge to skilled nursing. I doubt patient would be able to participate with therapy.

## 2014-12-05 NOTE — Progress Notes (Signed)
Pt dtr chooses [facility] for new Long Term Care facility- CSW spoke with [facility] who confirm they should be able to admit pt when ready for DC- they have no issues admitting patient over the weekend.  CSW will continue to follow- per MD pt likely ready to DC Sunday to SNF.  Merlyn Lot, LCSWA Clinical Social Worker [phone]

## 2014-12-05 NOTE — Progress Notes (Signed)
ANTICOAGULATION CONSULT NOTE - Follow Up Consult  Pharmacy Consult for Heparin and Coumadin Indication: atrial fibrillation  No Known Allergies  Patient Measurements: Weight: 129 lb 6.6 oz (58.7 kg)  Vital Signs: Temp: 98.9 F (37.2 C) (09/09 1508) Temp Source: Oral (09/09 1508) BP: 114/65 mmHg (09/09 1508) Pulse Rate: 89 (09/09 1508)  Labs:  Recent Labs  12/03/14 0445  12/03/14 2045 12/04/14 0439 12/04/14 1124 12/04/14 1432 12/04/14 1915 12/04/14 1916 12/05/14 0347 12/05/14 1630  HGB  --   < > 10.0* 10.1*  --   --  10.1* 11.2* 8.8*  --   HCT  --   < > 30.9* 31.8*  --   --  31.4* 33.0* 26.6*  --   PLT  --   < > 289 282  --   --  313  --  256  --   LABPROT 16.7*  --  15.8* 16.7*  --   --   --   --   --   --   INR 1.34  --  1.25 1.34  --   --   --   --   --   --   HEPARINUNFRC 0.32  --   --   --  0.26* 0.33  --   --   --  0.23*  CREATININE  --   --  0.88 0.80  --   --   --   --   --   --   < > = values in this interval not displayed.  Estimated Creatinine Clearance: 39 mL/min (by C-G formula based on Cr of 0.8).  Assessment: 89yof on coumadin pta for afib, admitted with right toe gangrene. INR 2.6 on admission, however, coumadin held and she received  vitamin k to reverse INR for possible surgery. Heparin bridge was started on 9/4. She underwent right BKA yesterday and per Dr. Lajoyce Corners, ok to resume heparin and coumadin today. Post-op ABLA is stable with Hgb 8.8.  Initial HL is subtherapeutic at 0.23 on heparin 850 units/hr. Nurse reports no issues with infusion or bleeding.  Home coumadin dose: 3.5mg  daily except  on Monday  Goal of Therapy:  INR 2-3 Heparin level 0.3-0.7 units/ml Monitor platelets by anticoagulation protocol: Yes   Plan:  1) Increase heparin to 900 units/hr 2) Check 8 hour heparin level 3) Coumadin  x 1 4) Daily INR, heparin level, CBC  Arlean Hopping. Newman Pies, PharmD Clinical Pharmacist Pager [phone] 12/05/2014,5:40 PM

## 2014-12-05 NOTE — Care Management Important Message (Signed)
Important Message  Patient Details  Name: Judy Grant MRN: [id number] Date of Birth: 07-May-1917   Medicare Important Message Given:  Yes-fourth notification given    Kyla Balzarine 12/05/2014, 11:31 AMImportant Message  Patient Details  Name: Judy Grant MRN: [id number] Date of Birth: 11-04-1917   Medicare Important Message Given:  Yes-fourth notification given    Kyla Balzarine 12/05/2014, 11:31 AM

## 2014-12-05 NOTE — Progress Notes (Signed)
ANTICOAGULATION CONSULT NOTE - Follow Up Consult  Pharmacy Consult for Heparin and Coumadin Indication: atrial fibrillation  No Known Allergies  Patient Measurements: Weight: 129 lb 6.6 oz (58.7 kg)  Vital Signs: Temp: 98.7 F (37.1 C) (09/09 0454) Temp Source: Oral (09/09 0454) BP: 120/61 mmHg (09/09 0454) Pulse Rate: 91 (09/09 0454)  Labs:  Recent Labs  12/03/14 0445  12/03/14 2045 12/04/14 0439 12/04/14 1124 12/04/14 1432 12/04/14 1915 12/04/14 1916 12/05/14 0347  HGB  --   < > 10.0* 10.1*  --   --  10.1* 11.2* 8.8*  HCT  --   < > 30.9* 31.8*  --   --  31.4* 33.0* 26.6*  PLT  --   < > 289 282  --   --  313  --  256  LABPROT 16.7*  --  15.8* 16.7*  --   --   --   --   --   INR 1.34  --  1.25 1.34  --   --   --   --   --   HEPARINUNFRC 0.32  --   --   --  0.26* 0.33  --   --   --   CREATININE  --   --  0.88 0.80  --   --   --   --   --   < > = values in this interval not displayed.  Estimated Creatinine Clearance: 39 mL/min (by C-G formula based on Cr of 0.8).  Assessment: 89yof on coumadin pta for afib, admitted with right toe gangrene. INR 2.6 on admission, however, coumadin held and she received  vitamin k to reverse INR for possible surgery. Heparin bridge was started on 9/4. She underwent right BKA yesterday and per Dr. Lajoyce Corners, ok to resume heparin and coumadin today. Post-op ABLA is stable with Hgb 8.8.  Home coumadin dose: 3.5mg  daily except  on Monday  Goal of Therapy:  INR 2-3 Heparin level 0.3-0.7 units/ml Monitor platelets by anticoagulation protocol: Yes   Plan:  1) Resume heparin at 850 units/hr 2) Check 8 hour heparin level 3) Coumadin  x 1 4) Daily INR, heparin level, CBC  Fredrik Rigger 12/05/2014,8:26 AM

## 2014-12-06 DIAGNOSIS — D62 Acute posthemorrhagic anemia: Secondary | ICD-10-CM

## 2014-12-06 DIAGNOSIS — S88111D Complete traumatic amputation at level between knee and ankle, right lower leg, subsequent encounter: Secondary | ICD-10-CM

## 2014-12-06 LAB — HEPARIN LEVEL (UNFRACTIONATED)
HEPARIN UNFRACTIONATED: 0.14 [IU]/mL — AB (ref 0.30–0.70)
HEPARIN UNFRACTIONATED: 0.58 [IU]/mL (ref 0.30–0.70)
Heparin Unfractionated: 0.56 IU/mL (ref 0.30–0.70)

## 2014-12-06 LAB — CBC
HCT: 27.3 % — ABNORMAL LOW (ref 36.0–46.0)
Hemoglobin: 8.8 g/dL — ABNORMAL LOW (ref 12.0–15.0)
MCH: 31.4 pg (ref 26.0–34.0)
MCHC: 32.2 g/dL (ref 30.0–36.0)
MCV: 97.5 fL (ref 78.0–100.0)
PLATELETS: 249 10*3/uL (ref 150–400)
RBC: 2.8 MIL/uL — ABNORMAL LOW (ref 3.87–5.11)
RDW: 14.2 % (ref 11.5–15.5)
WBC: 16.1 10*3/uL — ABNORMAL HIGH (ref 4.0–10.5)

## 2014-12-06 LAB — PROTIME-INR
INR: 1.41 (ref 0.00–1.49)
PROTHROMBIN TIME: 17.4 s — AB (ref 11.6–15.2)

## 2014-12-06 MED ORDER — DOXYCYCLINE HYCLATE 100 MG PO TABS
100.0000 mg | ORAL_TABLET | Freq: Two times a day (BID) | ORAL | Status: DC
  Administered 2014-12-06 – 2014-12-08 (×5): 100 mg via ORAL
  Filled 2014-12-06 (×8): qty 1

## 2014-12-06 MED ORDER — WARFARIN SODIUM 5 MG PO TABS
5.0000 mg | ORAL_TABLET | Freq: Once | ORAL | Status: AC
  Administered 2014-12-06: 5 mg via ORAL
  Filled 2014-12-06: qty 1

## 2014-12-06 MED ORDER — POLYSACCHARIDE IRON COMPLEX 150 MG PO CAPS
150.0000 mg | ORAL_CAPSULE | Freq: Every day | ORAL | Status: DC
  Administered 2014-12-06 – 2014-12-07 (×2): 150 mg via ORAL
  Filled 2014-12-06 (×4): qty 1

## 2014-12-06 MED ORDER — SALINE SPRAY 0.65 % NA SOLN
1.0000 | NASAL | Status: DC | PRN
  Filled 2014-12-06: qty 44

## 2014-12-06 NOTE — Progress Notes (Signed)
Pt had a dark stool. Callahan,NP paged. New orders given for RN to collect hemoccult.   Specimen collected and sent to lab. Results were negative. RN will cont to monitor.

## 2014-12-06 NOTE — Progress Notes (Signed)
[facility] PROGRESS NOTE Assessment/Plan: Sepsis due to cellulitis with Gangrene of foot: -will now switch to doxycycline PO and complete antibiotics therapy -no fever and no signs of systemic infection after amputation.  -Vascular surgery was consulted, who recommended a CT angiogram of right lower extremity on 9.4.2016, she is not a candidate for revascularization  -Status post right transtibial amputation on 9/8 -continue supportive care and PRN analgesics; patient reports the pain is well controlled currently  Preop Evaluation: - Due to age and multiple comorbidities, chronic kidney disease, atrial fibrillation on Coumadin, aortic stenosis   She is high risk for cardiopulmonary complications. Family and patient understand and they would like to proceed with surgery. -2-D echo demonstrating worsening on aortic stenosis in compare to echo in 2004; preserved EF, no wall motion abnormalities  -tolerated surgery without significant complication  Long term current use of anticoagulant therapy/  Paroxysmal atrial fibrillation: -CHADS VASC2 score is 6. -Ok to resume headbutting/Coumadin per orthopedic recommendations -Pharmacy helping [place] with dose adjustment -INR 1.4  Chronic kidney disease stage 3-4: -Creatinine at 1.2-1.3 -essentially at baseline by GFR -will monitor trend -pharmacy helping with medication adjustment for her renal function  Essential hypertension: -Continue metoprolol -Will continue holding Ace as patient with well controlled now -will monitor  Severe protein caloric malnutrition: Continue Ensure 3 times a day.  Acute blood loss anemia -Not need any transfusion currently -will start niferex -Will follow hemoglobin trend and transfuse as needed  Code Status:DNR/DNI Family Communication: daughter  Disposition Plan: remains inpatient; surgery anticipated for tomorrow 9/8 (after 5pm); SNF at discharge anticipated    Consultants:  Ortho (Dr.  Lajoyce Corners)  Vascular surgery  (Dr. Edilia Bo)  Procedures:  CXR  Right transtibial amputation (9/8)  Antibiotics:  vanc and rocephin  HPI/Subjective: No fever, no CP, no SOB. Patient with some runny nose, otherwise no complaints.  Objective: Filed Vitals:   12/05/14 1508 12/05/14 1939 12/05/14 2116 12/06/14 0356  BP: 114/65 107/53 109/64 119/48  Pulse: 89 89 91 79  Temp: 98.9 F (37.2 C) 99.1 F (37.3 C)  99 F (37.2 C)  TempSrc: Oral Oral  Oral  Resp: 19 18  18   Weight:      SpO2: 100% 100%  100%    Intake/Output Summary (Last 24 hours) at 12/06/14 0941 Last data filed at 12/06/14 0730  Gross per 24 hour  Intake    340 ml  Output    900 ml  Net   -560 ml   Filed Weights   11/28/14 2237  Weight: 58.7 kg (129 lb 6.6 oz)    Exam:  General: Alert, awake, oriented x3, in no acute distress. Cachectic, frail and chronically ill in appearance  HEENT: No bruits, no goiter, no JVD Heart: positive SEM, no rubs, no gallops, rate controlled; A. Fib per telemetry  Lungs: Good air movement, no crackles, no wheezing Abdomen: Soft, nontender, nondistended, positive bowel sounds.  Neuro: Grossly intact, nonfocal. MSK: Status post right transtibial amputation. Dressings in place clean and dry. No swelling and no signs of cyanosis or necrosis.   Data Reviewed: Basic Metabolic Panel:  Recent Labs Lab 11/29/14 1116 12/01/14 1000 12/03/14 2045 12/04/14 0439 12/04/14 1916  NA 132* 135 132* 133* 134*  K 3.9 4.2 4.3 4.4 4.4  CL 99* 103 97* 99*  --   CO2 24 25 23 26   --   GLUCOSE 98 87 165* 122* 106*  BUN 33* 14 13 11   --   CREATININE 1.22* 0.86 0.88  0.80  --   CALCIUM 8.8* 9.4 9.5 9.6  --    Liver Function Tests:  Recent Labs Lab 11/29/14 1116 12/03/14 2045  AST 23 23  ALT 15 15  ALKPHOS 57 62  BILITOT 0.8 0.8  PROT 6.1* 6.2*  ALBUMIN 2.4* 2.4*   CBC:  Recent Labs Lab 11/29/14 1154  12/03/14 2045 12/04/14 0439 12/04/14 1915 12/04/14 1916 12/05/14 0347  12/06/14 0135  WBC 14.6*  < > 16.9* 16.5* 17.5*  --  13.1* 16.1*  NEUTROABS 13.0*  --  14.8*  --   --   --   --   --   HGB 9.5*  < > 10.0* 10.1* 10.1* 11.2* 8.8* 8.8*  HCT 29.9*  < > 30.9* 31.8* 31.4* 33.0* 26.6* 27.3*  MCV 98.0  < > 96.3 96.7 96.0  --  96.7 97.5  PLT 226  < > 289 282 313  --  256 249  < > = values in this interval not displayed.  CBG: No results for input(s): GLUCAP in the last 168 hours.  Recent Results (from the past 240 hour(s))  Blood culture (routine x 2)     Status: None   Collection Time: 11/28/14  7:25 PM  Result Value Ref Range Status   Specimen Description BLOOD LEFT HAND  Final   Special Requests BOTTLES DRAWN AEROBIC ONLY 2CC  Final   Culture NO GROWTH 5 DAYS  Final   Report Status 12/03/2014 FINAL  Final  Blood culture (routine x 2)     Status: None   Collection Time: 11/28/14  7:39 PM  Result Value Ref Range Status   Specimen Description BLOOD LEFT ARM  Final   Special Requests BOTTLES DRAWN AEROBIC ONLY 3CC  Final   Culture NO GROWTH 5 DAYS  Final   Report Status 12/03/2014 FINAL  Final  MRSA PCR Screening     Status: None   Collection Time: 11/29/14 12:16 AM  Result Value Ref Range Status   MRSA by PCR NEGATIVE NEGATIVE Final    Comment:        The GeneXpert MRSA Assay (FDA approved for NASAL specimens only), is one component of a comprehensive MRSA colonization surveillance program. It is not intended to diagnose MRSA infection nor to guide or monitor treatment for MRSA infections.   Culture, blood (x 2)     Status: None   Collection Time: 11/29/14  9:50 AM  Result Value Ref Range Status   Specimen Description BLOOD LEFT ANTECUBITAL  Final   Special Requests BOTTLES DRAWN AEROBIC ONLY 4CC  Final   Culture NO GROWTH 5 DAYS  Final   Report Status 12/04/2014 FINAL  Final  Culture, blood (x 2)     Status: None   Collection Time: 11/29/14 10:00 AM  Result Value Ref Range Status   Specimen Description BLOOD BLOOD LEFT HAND  Final    Special Requests BOTTLES DRAWN AEROBIC ONLY 7CC  Final   Culture NO GROWTH 5 DAYS  Final   Report Status 12/04/2014 FINAL  Final     Studies: No results found.  Scheduled Meds: . atorvastatin  10 mg Oral q1800  . doxycycline  100 mg Oral Q12H  . feeding supplement (ENSURE ENLIVE)  237 mL Oral TID BM  . iron polysaccharides  150 mg Oral Daily  . metoprolol  75 mg Oral BID  . polyethylene glycol  17 g Oral Daily  . saccharomyces boulardii  250 mg Oral BID  . Warfarin - Pharmacist  Dosing Inpatient   Does not apply q1800   Continuous Infusions: . sodium chloride 10 mL/hr at 12/04/14 2122  . heparin 1,000 Units/hr (12/06/14 0358)  . lactated ringers 10 mL/hr at 12/04/14 1728    Time Spent:  25 min   Vassie Loll  [facility] Pager [phone].  If 7PM-7AM, please contact night-coverage at [email or web address], password [facility] 12/06/2014, 9:41 AM  LOS: 8 days

## 2014-12-06 NOTE — Progress Notes (Signed)
MD okayd F/C to be left in place until the morning of 9/11.  Family and pt showed concern about pt's pain with movement with incontinent episodes overnight.  Will pass along to other nursing staff in patient report.

## 2014-12-06 NOTE — Progress Notes (Signed)
ANTICOAGULATION CONSULT NOTE - Follow Up Consult  Pharmacy Consult for heparin Indication: atrial fibrillation  Labs:  Recent Labs  12/03/14 2045 12/04/14 0439  12/04/14 1432 12/04/14 1915 12/04/14 1916 12/05/14 0347 12/05/14 1630 12/06/14 0135  HGB 10.0* 10.1*  --   --  10.1* 11.2* 8.8*  --  8.8*  HCT 30.9* 31.8*  --   --  31.4* 33.0* 26.6*  --  27.3*  PLT 289 282  --   --  313  --  256  --  249  LABPROT 15.8* 16.7*  --   --   --   --   --   --  17.4*  INR 1.25 1.34  --   --   --   --   --   --  1.41  HEPARINUNFRC  --   --   < > 0.33  --   --   --  0.23* 0.14*  CREATININE 0.88 0.80  --   --   --   --   --   --   --   < > = values in this interval not displayed.    Assessment: 81yo female subtherapeutic on heparin with lower heparin level despite rate increase last pm; RN reports dark stools, occult test was negative, heparin gtt was paused momentarily during NP evaluation which should not affect level this dramatically; Hgb is stable.  Goal of Therapy:  Heparin level 0.3-0.7 units/ml   Plan:  Will increase heparin gtt cautiously to 1000 units/hr and check level in 8hr.  Vernard Gambles, PharmD, BCPS  12/06/2014,3:22 AM

## 2014-12-06 NOTE — Progress Notes (Addendum)
ANTICOAGULATION CONSULT NOTE - Follow Up Consult  Pharmacy Consult for Heparin and Coumadin Indication: atrial fibrillation  No Known Allergies  Patient Measurements: Weight: 129 lb 6.6 oz (58.7 kg)  Vital Signs: Temp: 99 F (37.2 C) (09/10 0356) Temp Source: Oral (09/10 0356) BP: 119/48 mmHg (09/10 0356) Pulse Rate: 79 (09/10 0356)  Labs:  Recent Labs  12/03/14 2045 12/04/14 0439  12/04/14 1915 12/04/14 1916 12/05/14 0347 12/05/14 1630 12/06/14 0135 12/06/14 1140  HGB 10.0* 10.1*  --  10.1* 11.2* 8.8*  --  8.8*  --   HCT 30.9* 31.8*  --  31.4* 33.0* 26.6*  --  27.3*  --   PLT 289 282  --  313  --  256  --  249  --   LABPROT 15.8* 16.7*  --   --   --   --   --  17.4*  --   INR 1.25 1.34  --   --   --   --   --  1.41  --   HEPARINUNFRC  --   --   < >  --   --   --  0.23* 0.14* 0.56  CREATININE 0.88 0.80  --   --   --   --   --   --   --   < > = values in this interval not displayed.  Estimated Creatinine Clearance: 39 mL/min (by C-G formula based on Cr of 0.8).   Medications: Heparin @ 1000 units/hr  Assessment: 89yof on coumadin pta for afib, admitted with right toe gangrene. INR 2.6 on admission, however, coumadin held and she received  vitamin k to reverse INR for possible surgery. Heparin bridge was started on 9/4. She underwent right BKA on 9/8 and heparin and coumadin resumed yesterday. Heparin level is therapeutic after multiple rate increases. INR remains below goal at 1.41. Post-op ABLA is stable with Hgb 8.8. Patient had dark stools last night but FOBT was negative, otherwise no bleeding reported.  Home coumadin dose: 3.5mg  daily except  on Monday  Goal of Therapy:  INR 2-3 Heparin level 0.3-0.7 units/ml Monitor platelets by anticoagulation protocol: Yes   Plan:  1) Continue heparin at 1000 units/hr 2) Check 8 hour heparin level to confirm 3) Repeat Coumadin  x 1 4) Daily INR, heparin level, CBC  Fredrik Rigger 12/06/2014,12:11  PM   Addendum: Confirmatory heparin level is therapeutic at 0.58. Continue heparin at 1000 units/hr and follow up AM labs.  Fredrik Rigger 12/06/2014, 8:22 PM

## 2014-12-07 LAB — TYPE AND SCREEN
ABO/RH(D): A POS
ANTIBODY SCREEN: POSITIVE
DAT, IGG: NEGATIVE
DONOR AG TYPE: NEGATIVE
Donor AG Type: NEGATIVE
PT AG Type: NEGATIVE
UNIT DIVISION: 0
UNIT DIVISION: 0

## 2014-12-07 LAB — CBC
HCT: 25.1 % — ABNORMAL LOW (ref 36.0–46.0)
HEMOGLOBIN: 8 g/dL — AB (ref 12.0–15.0)
MCH: 31.7 pg (ref 26.0–34.0)
MCHC: 31.9 g/dL (ref 30.0–36.0)
MCV: 99.6 fL (ref 78.0–100.0)
PLATELETS: 183 10*3/uL (ref 150–400)
RBC: 2.52 MIL/uL — ABNORMAL LOW (ref 3.87–5.11)
RDW: 14.4 % (ref 11.5–15.5)
WBC: 14.5 10*3/uL — ABNORMAL HIGH (ref 4.0–10.5)

## 2014-12-07 LAB — PROTIME-INR
INR: 1.39 (ref 0.00–1.49)
Prothrombin Time: 17.2 seconds — ABNORMAL HIGH (ref 11.6–15.2)

## 2014-12-07 LAB — HEPARIN LEVEL (UNFRACTIONATED): HEPARIN UNFRACTIONATED: 0.77 [IU]/mL — AB (ref 0.30–0.70)

## 2014-12-07 LAB — PREPARE RBC (CROSSMATCH)

## 2014-12-07 MED ORDER — SODIUM CHLORIDE 0.9 % IV SOLN: Freq: Once | INTRAVENOUS | Status: DC

## 2014-12-07 MED ORDER — WARFARIN SODIUM 7.5 MG PO TABS
7.5000 mg | ORAL_TABLET | Freq: Once | ORAL | Status: AC
  Administered 2014-12-07: 7.5 mg via ORAL
  Filled 2014-12-07: qty 1

## 2014-12-07 MED ORDER — FUROSEMIDE 10 MG/ML IJ SOLN
20.0000 mg | Freq: Once | INTRAMUSCULAR | Status: AC
  Administered 2014-12-07: 20 mg via INTRAVENOUS

## 2014-12-07 MED ORDER — ENOXAPARIN SODIUM 60 MG/0.6ML ~~LOC~~ SOLN
60.0000 mg | Freq: Two times a day (BID) | SUBCUTANEOUS | Status: DC
  Administered 2014-12-07 – 2014-12-09 (×3): 60 mg via SUBCUTANEOUS
  Filled 2014-12-07 (×7): qty 0.6

## 2014-12-07 NOTE — Progress Notes (Signed)
ANTICOAGULATION CONSULT NOTE - Follow Up Consult  Pharmacy Consult for heparin Indication: atrial fibrillation  Labs:  Recent Labs  12/05/14 0347  12/06/14 0135 12/06/14 1140 12/06/14 1905 12/07/14 0450  HGB 8.8*  --  8.8*  --   --  8.0*  HCT 26.6*  --  27.3*  --   --  25.1*  PLT 256  --  249  --   --  183  LABPROT  --   --  17.4*  --   --  17.2*  INR  --   --  1.41  --   --  1.39  HEPARINUNFRC  --   < > 0.14* 0.56 0.58 0.77*  < > = values in this interval not displayed.   Assessment: 81yo female now above goal on heparin after two levels at goal.  Goal of Therapy:  Heparin level 0.3-0.7 units/ml   Plan:  Will decrease heparin gtt by 1-2 units/kg/hr to 900 units/hr and check level in 8hr.  Vernard Gambles, PharmD, BCPS  12/07/2014,5:37 AM

## 2014-12-07 NOTE — Progress Notes (Signed)
[facility] PROGRESS NOTE Assessment/Plan: Sepsis due to cellulitis with Gangrene of foot: -will continue tx with doxycycline PO to complete antibiotics therapy -no fever and no signs of systemic infection after amputation.  -Vascular surgery was consulted, who recommended a CT angiogram of right lower extremity on 9.4.2016, she is not a candidate for revascularization  -Status post right transtibial amputation on 9/8 -continue supportive care and PRN analgesics; patient reports the pain is well controlled currently  Preop Evaluation: - Due to age and multiple comorbidities, chronic kidney disease, atrial fibrillation on Coumadin, aortic stenosis   She is high risk for cardiopulmonary complications. Family and patient understand and they would like to proceed with surgery. -2-D echo demonstrating worsening on aortic stenosis in compare to echo in 2004; preserved EF, no wall motion abnormalities  -tolerated surgery without significant complication  Long term current use of anticoagulant therapy/  Paroxysmal atrial fibrillation: -CHADS VASC2 score is 6. -Ok to resume Coumadin per orthopedic recommendations -Pharmacy helping [place] with dose adjustment -INR 1.4 -will bridge with lovenox in anticipating discharge on 9/12  Chronic kidney disease stage 3-4: -Creatinine at 1.2-1.3 -essentially at baseline by GFR -will monitor trend -pharmacy helping with medication adjustment for her renal function  Essential hypertension: -Continue metoprolol -Will continue holding Ace as patient with BP well controlled now -will monitor  Severe protein caloric malnutrition: Continue Ensure 3 times a day.  Acute blood loss anemia -Hgb down to 8.0 and patient complaining of worsening weakness and slight tachycardic  -will transfuse 1 unit PRBC -will continue niferex -Will follow hemoglobin trend   Code Status: DNR/DNI Family Communication: daughter  Disposition Plan: remains inpatient;  surgery anticipated for tomorrow 9/8 (after 5pm); SNF at discharge anticipated    Consultants:  Ortho (Dr. Lajoyce Corners)  Vascular surgery  (Dr. Edilia Bo)  Procedures:  CXR  Right transtibial amputation (9/8)  Antibiotics:  vanc and rocephin  HPI/Subjective: No fever, no CP, no SOB. Patient with some weakness and mild tachycardia; Hgb continue to trend down  Objective: Filed Vitals:   12/06/14 1317 12/06/14 2110 12/07/14 0705 12/07/14 1300  BP: 126/64 131/84 135/66 107/59  Pulse: 94 100 85 76  Temp: 99.6 F (37.6 C) 99.4 F (37.4 C) 98.1 F (36.7 C) 98 F (36.7 C)  TempSrc: Oral Oral Oral Oral  Resp: Weight:      SpO2: 99% 99% 100% 100%    Intake/Output Summary (Last 24 hours) at 12/07/14 1429 Last data filed at 12/07/14 1230  Gross per 24 hour  Intake    150 ml  Output    726 ml  Net   -576 ml   Filed Weights   11/28/14 2237  Weight: 58.7 kg (129 lb 6.6 oz)    Exam:  General: Alert, awake, oriented x3; mildly tachycardic and increase weakness. Cachectic, frail and chronically ill in appearance  HEENT: No bruits, no goiter, no JVD Heart: positive SEM, no rubs, no gallops, rate controlled; A. Fib per telemetry  Lungs: Good air movement, no crackles, no wheezing Abdomen: Soft, nontender, nondistended, positive bowel sounds.  Neuro: Grossly intact, nonfocal. MSK: Status post right transtibial amputation. Dressings in place clean and dry. No swelling and no signs of cyanosis or necrosis.   Data Reviewed: Basic Metabolic Panel:  Recent Labs Lab 12/01/14 1000 12/03/14 2045 12/04/14 0439 12/04/14 1916  NA 135 132* 133* 134*  K 4.2 4.3 4.4 4.4  CL 103 97* 99*  --   CO2 --  GLUCOSE 87 165* 122* 106*  BUN 14 13 11   --   CREATININE 0.86 0.88 0.80  --   CALCIUM 9.4 9.5 9.6  --    Liver Function Tests:  Recent Labs Lab 12/03/14 2045  AST 23  ALT 15  ALKPHOS 62  BILITOT 0.8  PROT 6.2*  ALBUMIN 2.4*   CBC:  Recent Labs Lab  12/03/14 2045 12/04/14 0439 12/04/14 1915 12/04/14 1916 12/05/14 0347 12/06/14 0135 12/07/14 0450  WBC 16.9* 16.5* 17.5*  --  13.1* 16.1* 14.5*  NEUTROABS 14.8*  --   --   --   --   --   --   HGB 10.0* 10.1* 10.1* 11.2* 8.8* 8.8* 8.0*  HCT 30.9* 31.8* 31.4* 33.0* 26.6* 27.3* 25.1*  MCV 96.3 96.7 96.0  --  96.7 97.5 99.6  PLT 289 282 313  --  256 249 183    CBG: No results for input(s): GLUCAP in the last 168 hours.  Recent Results (from the past 240 hour(s))  Blood culture (routine x 2)     Status: None   Collection Time: 11/28/14  7:25 PM  Result Value Ref Range Status   Specimen Description BLOOD LEFT HAND  Final   Special Requests BOTTLES DRAWN AEROBIC ONLY 2CC  Final   Culture NO GROWTH 5 DAYS  Final   Report Status 12/03/2014 FINAL  Final  Blood culture (routine x 2)     Status: None   Collection Time: 11/28/14  7:39 PM  Result Value Ref Range Status   Specimen Description BLOOD LEFT ARM  Final   Special Requests BOTTLES DRAWN AEROBIC ONLY 3CC  Final   Culture NO GROWTH 5 DAYS  Final   Report Status 12/03/2014 FINAL  Final  MRSA PCR Screening     Status: None   Collection Time: 11/29/14 12:16 AM  Result Value Ref Range Status   MRSA by PCR NEGATIVE NEGATIVE Final    Comment:        The GeneXpert MRSA Assay (FDA approved for NASAL specimens only), is one component of a comprehensive MRSA colonization surveillance program. It is not intended to diagnose MRSA infection nor to guide or monitor treatment for MRSA infections.   Culture, blood (x 2)     Status: None   Collection Time: 11/29/14  9:50 AM  Result Value Ref Range Status   Specimen Description BLOOD LEFT ANTECUBITAL  Final   Special Requests BOTTLES DRAWN AEROBIC ONLY 4CC  Final   Culture NO GROWTH 5 DAYS  Final   Report Status 12/04/2014 FINAL  Final  Culture, blood (x 2)     Status: None   Collection Time: 11/29/14 10:00 AM  Result Value Ref Range Status   Specimen Description BLOOD BLOOD LEFT  HAND  Final   Special Requests BOTTLES DRAWN AEROBIC ONLY 7CC  Final   Culture NO GROWTH 5 DAYS  Final   Report Status 12/04/2014 FINAL  Final     Studies: No results found.  Scheduled Meds: . sodium chloride   Intravenous Once  . atorvastatin  10 mg Oral q1800  . doxycycline  100 mg Oral Q12H  . feeding supplement (ENSURE ENLIVE)  237 mL Oral TID BM  . furosemide  20 mg Intravenous Once  . iron polysaccharides  150 mg Oral Daily  . metoprolol  75 mg Oral BID  . polyethylene glycol  17 g Oral Daily  . saccharomyces boulardii  250 mg Oral BID  . Warfarin - Pharmacist Dosing  Inpatient   Does not apply q1800   Continuous Infusions: . sodium chloride 10 mL/hr at 12/04/14 2122  . lactated ringers 10 mL/hr at 12/04/14 1728    Time Spent:  30 min   Vassie Loll  [facility] Pager [phone].  If 7PM-7AM, please contact night-coverage at [email or web address], password [facility] 12/07/2014, 2:29 PM  LOS: 9 days

## 2014-12-07 NOTE — Progress Notes (Signed)
  morphine given to pt.  Other  wasted in sharps with [facility] RN.

## 2014-12-07 NOTE — Progress Notes (Signed)
ANTICOAGULATION CONSULT NOTE - Follow Up Consult  Pharmacy Consult for Lovenox and Coumadin Indication: atrial fibrillation  No Known Allergies  Patient Measurements: Weight: 129 lb 6.6 oz (58.7 kg)  Vital Signs: Temp: 98 F (36.7 C) (09/11 1300) Temp Source: Oral (09/11 1300) BP: 107/59 mmHg (09/11 1300) Pulse Rate: 76 (09/11 1300)  Labs:  Recent Labs  12/05/14 0347  12/06/14 0135 12/06/14 1140 12/06/14 1905 12/07/14 0450  HGB 8.8*  --  8.8*  --   --  8.0*  HCT 26.6*  --  27.3*  --   --  25.1*  PLT 256  --  249  --   --  183  LABPROT  --   --  17.4*  --   --  17.2*  INR  --   --  1.41  --   --  1.39  HEPARINUNFRC  --   < > 0.14* 0.56 0.58 0.77*  < > = values in this interval not displayed.  Estimated Creatinine Clearance: 39 mL/min (by C-G formula based on Cr of 0.8).   Assessment: Judy Grant on coumadin pta for afib, admitted with right toe gangrene. INR 2.6 on admission, however, coumadin held and she received  vitamin k to reverse INR for possible surgery. Heparin bridge was started on 9/4. She underwent right BKA on 9/8 and heparin and coumadin resumed 9/9. Heparin will now be switched to therapeutic lovenox anticipating discharge tomorrow. INR remains below goal and not moving much which could be due to amount of vitamin k in her ensure supplements. Hgb down to 8 - to receive 1 unit PRBCs.   Home coumadin dose: 3.5mg  daily except  on Monday  Goal of Therapy:  INR 2-3 Monitor platelets by anticoagulation protocol: Yes   Plan:  1) Lovenox  sq q12 2) Increase coumadin to 7.5mg  x 1 3) INR in AM  Fredrik Rigger 12/07/2014,2:46 PM

## 2014-12-08 ENCOUNTER — Inpatient Hospital Stay (HOSPITAL_COMMUNITY): Payer: Medicare Other

## 2014-12-08 ENCOUNTER — Encounter (HOSPITAL_COMMUNITY): Payer: Self-pay | Admitting: Neurology

## 2014-12-08 DIAGNOSIS — I639 Cerebral infarction, unspecified: Secondary | ICD-10-CM

## 2014-12-08 DIAGNOSIS — R5381 Other malaise: Secondary | ICD-10-CM

## 2014-12-08 DIAGNOSIS — D62 Acute posthemorrhagic anemia: Secondary | ICD-10-CM | POA: Insufficient documentation

## 2014-12-08 DIAGNOSIS — E1152 Type 2 diabetes mellitus with diabetic peripheral angiopathy with gangrene: Secondary | ICD-10-CM

## 2014-12-08 LAB — CBC
HEMATOCRIT: 30.8 % — AB (ref 36.0–46.0)
Hemoglobin: 9.6 g/dL — ABNORMAL LOW (ref 12.0–15.0)
MCH: 29.6 pg (ref 26.0–34.0)
MCHC: 31.2 g/dL (ref 30.0–36.0)
MCV: 95.1 fL (ref 78.0–100.0)
Platelets: 193 10*3/uL (ref 150–400)
RBC: 3.24 MIL/uL — ABNORMAL LOW (ref 3.87–5.11)
RDW: 15.9 % — AB (ref 11.5–15.5)
WBC: 11.9 10*3/uL — ABNORMAL HIGH (ref 4.0–10.5)

## 2014-12-08 LAB — BASIC METABOLIC PANEL
Anion gap: 6 (ref 5–15)
BUN: 16 mg/dL (ref 6–20)
CALCIUM: 9.2 mg/dL (ref 8.9–10.3)
CHLORIDE: 98 mmol/L — AB (ref 101–111)
CO2: 27 mmol/L (ref 22–32)
CREATININE: 0.87 mg/dL (ref 0.44–1.00)
GFR calc non Af Amer: 55 mL/min — ABNORMAL LOW (ref >60)
GLUCOSE: 118 mg/dL — AB (ref 65–99)
Potassium: 4.8 mmol/L (ref 3.5–5.1)
Sodium: 131 mmol/L — ABNORMAL LOW (ref 135–145)

## 2014-12-08 LAB — TYPE AND SCREEN
ABO/RH(D): A POS
ANTIBODY SCREEN: POSITIVE
DAT, IgG: NEGATIVE
DONOR AG TYPE: NEGATIVE
Unit division: 0

## 2014-12-08 LAB — GLUCOSE, CAPILLARY: Glucose-Capillary: 123 mg/dL — ABNORMAL HIGH (ref 65–99)

## 2014-12-08 LAB — PROTIME-INR
INR: 1.26 (ref 0.00–1.49)
PROTHROMBIN TIME: 16 s — AB (ref 11.6–15.2)

## 2014-12-08 MED ORDER — ACETAMINOPHEN 325 MG PO TABS: 650.0000 mg | ORAL_TABLET | Freq: Four times a day (QID) | ORAL | Status: DC | PRN

## 2014-12-08 MED ORDER — METOCLOPRAMIDE HCL 5 MG PO TABS: 5.0000 mg | ORAL_TABLET | Freq: Three times a day (TID) | ORAL | Status: DC | PRN

## 2014-12-08 MED ORDER — ONDANSETRON HCL 4 MG PO TABS
4.0000 mg | ORAL_TABLET | Freq: Four times a day (QID) | ORAL | Status: DC | PRN
Start: 2014-12-08 — End: 2014-12-09

## 2014-12-08 MED ORDER — ONDANSETRON HCL 4 MG/2ML IJ SOLN
4.0000 mg | Freq: Four times a day (QID) | INTRAMUSCULAR | Status: DC | PRN
Start: 2014-12-08 — End: 2014-12-10

## 2014-12-08 MED ORDER — METOPROLOL TARTRATE 1 MG/ML IV SOLN
2.5000 mg | Freq: Once | INTRAVENOUS | Status: AC
  Administered 2014-12-08: 2.5 mg via INTRAVENOUS
  Filled 2014-12-08: qty 5

## 2014-12-08 MED ORDER — NALOXONE HCL 0.4 MG/ML IJ SOLN
INTRAMUSCULAR | Status: AC
  Administered 2014-12-08: 0.4 mg
  Filled 2014-12-08: qty 1

## 2014-12-08 MED ORDER — ACETAMINOPHEN 650 MG RE SUPP: 650.0000 mg | Freq: Four times a day (QID) | RECTAL | Status: DC | PRN

## 2014-12-08 MED ORDER — WARFARIN SODIUM 7.5 MG PO TABS: 7.5000 mg | ORAL_TABLET | Freq: Once | ORAL | Status: DC

## 2014-12-08 MED ORDER — OXYCODONE-ACETAMINOPHEN 5-325 MG PO TABS: 1.0000 | ORAL_TABLET | Freq: Four times a day (QID) | ORAL | Status: DC | PRN

## 2014-12-08 MED ORDER — METHOCARBAMOL 1000 MG/10ML IJ SOLN: 500.0000 mg | Freq: Four times a day (QID) | INTRAMUSCULAR | Status: DC | PRN

## 2014-12-08 MED ORDER — NALOXONE HCL 0.4 MG/ML IJ SOLN: 0.4000 mg | INTRAMUSCULAR | Status: DC | PRN

## 2014-12-08 MED ORDER — METOCLOPRAMIDE HCL 5 MG/ML IJ SOLN: 5.0000 mg | Freq: Three times a day (TID) | INTRAMUSCULAR | Status: DC | PRN

## 2014-12-08 MED ORDER — METHOCARBAMOL 500 MG PO TABS: 500.0000 mg | ORAL_TABLET | Freq: Four times a day (QID) | ORAL | Status: DC | PRN

## 2014-12-08 MED ORDER — MORPHINE SULFATE (PF) 2 MG/ML IV SOLN: 2.0000 mg | INTRAVENOUS | Status: DC | PRN

## 2014-12-08 MED ORDER — SACCHAROMYCES BOULARDII 250 MG PO CAPS: 250.0000 mg | ORAL_CAPSULE | Freq: Two times a day (BID) | ORAL | Status: DC

## 2014-12-08 MED ORDER — DOXYCYCLINE HYCLATE 100 MG PO TABS: 100.0000 mg | ORAL_TABLET | Freq: Two times a day (BID) | ORAL | Status: DC

## 2014-12-08 MED ORDER — POLYSACCHARIDE IRON COMPLEX 150 MG PO CAPS: 150.0000 mg | ORAL_CAPSULE | Freq: Every day | ORAL | Status: DC

## 2014-12-08 MED ORDER — ENOXAPARIN SODIUM 60 MG/0.6ML ~~LOC~~ SOLN: 60.0000 mg | Freq: Two times a day (BID) | SUBCUTANEOUS | Status: DC

## 2014-12-08 NOTE — Progress Notes (Signed)
Stroke RN received call from RRT RN at 1420 regarding patient's exam.  Stroke RN paged Dr. Roseanne Reno.  MD made aware of patient.  Proceed with CT per Dr. Roseanne Reno.  Stroke RN assisted RRT RN in transporting patient to CT and back to 2W16.  Dr. Roseanne Reno paged and made aware that CT was complete.  Patient is not a candidate for tPA d/t receiving Lovenox within 24 hours and is not a candidate for endovascular intervention.  Dr. Roseanne Reno to consult on patient.

## 2014-12-08 NOTE — Discharge Summary (Signed)
Physician Discharge Summary  Judy Grant ZOX:[id number] DOB: [birth year] DOA: 11/28/2014  PCP: [name], MD  Admit date: 11/28/2014 Discharge date: 12/08/2014  Time spent: >30  minutes  Recommendations for Outpatient Follow-up:  Please follow with Dr. Lajoyce Corners as instructed Check CBC in 5 days to follow Hgb trend Close follow up to coumadin level and discontinue lovenox bridge once therapeutic (2-3) Please noticed doxycycline only for 4 mors days to complete abx's therapy Repeat BMET in 5 days to follow electrolytes and renal function   Discharge Diagnoses:  Gangrene right great toe/foot S/p right transtibial amputation  ABLA Paroxysmal atrial fibrillation Chronic kidney disease Long term current use of anticoagulant therapy Cellulitis of right foot Chronic systolic/diastolic HF  Discharge Condition: stable and improved. Will discharge to SNF for further care and treatment   Diet recommendation: heart healthy diet  Filed Weights   11/28/14 2237  Weight: 58.7 kg (129 lb 6.6 oz)    History of present illness:  81 year old female who  has a past medical history of Hyperlipidemia; Osteoporosis; Hypertension; Valvular heart disease; Paroxysmal atrial fibrillation; Diverticulosis; Degenerative joint disease; Chronic kidney disease; Anemia, iron deficiency; Popliteal cyst; Fracture of wrist (2008); Gout; Stroke (right side deficit); NSTEMI, initial episode of care (01/05/2012); and Chronic combined systolic and diastolic congestive heart failure (02/2013). Patient was sent to the ED from nursing home for worsening of right great toe pain along with black colored discoloration. Patient says that this has been going on for past few days, but recently has become worse. She endorses severe pain. Denies fever. Denies chest pain, no shortness of breath. No nausea vomiting or diarrhea. Admitted for further treatment of gangrene.   Hospital Course:  Sepsis due to cellulitis with  Gangrene of foot: -will continue tx with doxycycline PO to complete antibiotics therapy -no fever and no signs of systemic infection after amputation.  -Vascular surgery was consulted, who recommended a CT angiogram of right lower extremity on 9.4.2016, she is not a candidate for revascularization  -Status post right transtibial amputation on 9/8 -continue supportive care and PRN analgesics; patient reports the pain is well controlled currently  Preop Evaluation: - Due to age and multiple comorbidities, chronic kidney disease, atrial fibrillation on Coumadin, aortic stenosis  She is high risk for cardiopulmonary complications. Family and patient understand and they would like to proceed with surgery. -2-D echo demonstrating worsening on aortic stenosis in compare to echo in 2004; preserved EF, no wall motion abnormalities  -tolerated surgery without significant complication  Long term current use of anticoagulant therapy/ Paroxysmal atrial fibrillation: -CHADS VASC2 score is 6. -INR 1.26 -will bridge with lovenox and continue tx along with coumadin until therapeutic  Chronic kidney disease stage 3-4: -Creatinine at 1.2-1.3 -essentially at baseline by GFR -will monitor renal function trend  Essential hypertension: -Continue metoprolol and lasix  Severe protein caloric malnutrition: -Continue Ensure for feeding supplementation   Acute blood loss anemia -Hgb down to 8.0 and patient complaining of worsening weakness and slight tachycardic  -received 1 unit of PRBC transfusion -will continue niferex -Ffollow hemoglobin trend  -at discharge Hgb 9.6 -no active bleeding appreciated  Chronic combined heart failure -compensated -no SOB or CP -will recommend checking daily weights, following low sodium diet and to maintain adequate hydration  Procedures:  See below for x-ray reports   Right transtibial amputation (12/04/14)  Consultations:  Vascular surgery  Orthopedic  service   Discharge Exam: Filed Vitals:   12/08/14 0953  BP: 124/67  Pulse: 87  Temp:   Resp:    General: Alert, awake, oriented x3; feeling much better and energetic. Denies CP or SOB. Patient is afebrile. Cachectic, frail and chronically ill in appearance  HEENT: No bruits, no goiter, no JVD Heart: positive SEM, no rubs, no gallops, rate controlled; A. Fib per telemetry  Lungs: Good air movement, no crackles, no wheezing Abdomen: Soft, nontender, nondistended, positive bowel sounds.  Neuro: Grossly intact, nonfocal. MSK: Status post right transtibial amputation. Dressings in place clean and dry. No swelling and no signs of cyanosis or necrosis.   Discharge Instructions   Discharge Instructions    Diet - low sodium heart healthy    Complete by:  As directed      Discharge instructions    Complete by:  As directed   Take medications as prescribed Please follow with Dr. Lajoyce Corners as instructed Physical therapy and rehab as per SNF protocol Check CBC in 5 days to follow Hgb trend Close follow up to coumadin level and discontinue lovenox bridge once therapeutic (2-3) Heart healthy diet Please noticed doxycycline only for 4 mors days to complete abx's therapy Repeat BMET in 5 days to follow electrolytes and renal function          Current Discharge Medication List    START taking these medications   Details  doxycycline (VIBRA-TABS) 100 MG tablet Take 1 tablet (100 mg total) by mouth every 12 (twelve) hours.    enoxaparin (LOVENOX) 60 MG/0.6ML injection Inject 0.6 mLs (60 mg total) into the skin every 12 (twelve) hours. To be use until INR therapeutic Qty: 0 Syringe    iron polysaccharides (NIFEREX) 150 MG capsule Take 1 capsule (150 mg total) by mouth daily.    oxyCODONE-acetaminophen (PERCOCET/ROXICET) 5-325 MG per tablet Take 1-2 tablets by mouth every 6 (six) hours as needed for severe pain. Qty: 30 tablet, Refills: 0    saccharomyces boulardii (FLORASTOR) 250 MG  capsule Take 1 capsule (250 mg total) by mouth 2 (two) times daily.      CONTINUE these medications which have NOT CHANGED   Details  allopurinol (ZYLOPRIM) 100 MG tablet TAKE ONE TABLET BY MOUTH ONCE DAILY. Qty: 30 tablet, Refills: 3    Amino Acids-Protein Hydrolys (FEEDING SUPPLEMENT, PRO-STAT SUGAR FREE 64,) LIQD Take 30 mLs by mouth 2 (two) times daily with a meal.     atorvastatin (LIPITOR) 10 MG tablet Take 10 mg by mouth daily at 6 PM.    furosemide (LASIX) 40 MG tablet TAKE ONE TABLET BY MOUTH ONCE DAILY. Qty: 30 tablet, Refills: 0    metoprolol (LOPRESSOR) 50 MG tablet Take 50 mg by mouth 2 (two) times daily.     ondansetron (ZOFRAN) 4 MG tablet Take 4 mg by mouth every 8 (eight) hours as needed for nausea or vomiting.    pantoprazole (PROTONIX) 20 MG tablet Take 20 mg by mouth 2 (two) times daily.    Wheat Dextrin (BENEFIBER PO) Take 1 each by mouth daily.    acetaminophen (TYLENOL) 500 MG tablet Take 1 tablet (500 mg total) by mouth every 8 (eight) hours. Qty: 30 tablet, Refills: 0    alendronate (FOSAMAX) 70 MG tablet TAKE 1 TAB EACH WEEK 30 MIN PRIOR TO BREAKFAST WITH LARGE GLASS OF WATER. REMAIN UPRIGHT. Qty: 4 tablet, Refills: 3    ASPIRIN LOW DOSE 81 MG EC tablet TAKE ONE TABLET BY MOUTH ONCE DAILY. Qty: 30 tablet, Refills: 3    Calcium Carb-Cholecalciferol 500-400 MG-UNIT TABS TAKE (1) TABLET BY MOUTH (3)  TIMES DAILY. Qty: 90 tablet, Refills: 6    COLACE 50 MG capsule TAKE (1) CAPSULE BY MOUTH TWICE DAILY. Qty: 60 capsule, Refills: 3    diclofenac sodium (VOLTAREN) 1 % GEL Apply 2 g topically 3 (three) times daily. Qty: 1 Tube, Refills: 0    feeding supplement, ENSURE COMPLETE, (ENSURE COMPLETE) LIQD Take 237 mLs by mouth 2 (two) times daily between meals.    fluticasone (FLONASE) 50 MCG/ACT nasal spray USE 1 SPRAY IN EACH NOSTRIL ONCE DAILY. Qty: 16 g, Refills: 2    menthol-cetylpyridinium (CEPACOL) 3 MG lozenge Take 1 lozenge (3 mg total) by mouth as  needed for sore throat. Qty: 100 tablet, Refills: 12    potassium chloride (K-DUR) 10 MEQ tablet TAKE 1 TABLET BY MOUTH ON MONDAY,WEDNESDAY AND FRIDAY. Qty: 12 tablet, Refills: 3    simvastatin (ZOCOR) 20 MG tablet TAKE (1) TABLET BY MOUTH AT BEDTIME FOR CHOLESTEROL. Qty: 30 tablet, Refills: 3    !! warfarin (COUMADIN) 1 MG tablet TAKE 1 TABLET BY MOUTH ONCE A DAY,ALONG WITH 2.5 MG DAILY TO EQUAL 3.5 MG. Qty: 30 tablet, Refills: 3    !! warfarin (COUMADIN) 2.5 MG tablet TAKE 1 TABLET BY MOUTH ONCE A DAY,ALONG WITH 1 MG TO EQUAL 3.5 MG DAILY. Qty: 30 tablet, Refills: 3     !! - Potential duplicate medications found. Please discuss with provider.    STOP taking these medications     collagenase (SANTYL) ointment      benzonatate (TESSALON) 100 MG capsule      cefUROXime (CEFTIN) 250 MG tablet      HYDROcodone-acetaminophen (NORCO/VICODIN) 5-325 MG per tablet        No Known Allergies Follow-up Information    Follow up with DUDA,MARCUS V, MD In 2 weeks.   Specialty:  Orthopedic Surgery   Contact information:   [address] [place] [address] [phone]        The results of significant diagnostics from this hospitalization (including imaging, microbiology, ancillary and laboratory) are listed below for reference.    Significant Diagnostic Studies: Ct Angio Low Extrem Right W/cm &/or Wo/cm  11/30/2014   CLINICAL DATA:  81 year old female with diffuse infrainguinal arterial occlusive disease and active right foot cellulitis. Evaluate for possible endovascular intervention versus amputation.  EXAM: CT ANGIOGRAPHY OF THE RIGHT UPPEREXTREMITY  TECHNIQUE: Multidetector CT imaging of the right upper extremitywas performed using the standard protocol during bolus administration of intravenous contrast. Multiplanar CT image reconstructions and MIPs were obtained to evaluate the vascular anatomy.  CONTRAST:  80mL OMNIPAQUE IOHEXOL 350 MG/ML SOLN  COMPARISON:  None.   FINDINGS: Aorta:Minimal calcification in the visualized infrarenal abdominal aorta. No evidence of stenosis, aneurysm or other abnormality.  Inflow: Widely patent right common, internal and external iliac arteries. Minimal calcific plaque.  Outflow: The right common femoral artery is widely patent and relatively disease free. The profunda femoral artery is widely patent. The superficial femoral artery demonstrates multifocal calcified atherosclerotic plaque but no significant narrowing through the level of the adductor canal. There is a focal high-grade stenosis of the most distal SFA/above the knee popliteal artery secondary to bulky calcified atherosclerotic plaque. The remainder the popliteal artery is moderately diseased with largely calcified atherosclerotic plaque.  Runoff: Suspect high-grade stenosis at the origin of the anterior tibial artery. The origin is not well seen. The anterior tibial artery then occludes in the proximal calf. The tibioperoneal trunk is occluded. Evaluation of reconstitution of the runoff vessels is limited secondary  to relatively poor contrast opacification and significant vascular calcifications. The posterior tibial artery does appear to reconstitute above the ankle  Review of the MIP images confirms the above findings.  Surgical changes of prior right hip arthroplasty. No evidence of hardware complication. Streak artifact slightly limits evaluation of the upper thigh. Degenerative osteoarthritic changes are present at the knee joint. Changes are most significant in the lateral compartment. Loose bodies are present within the lateral aspect of the suprapatellar recess. No focal osseous lesion.  IMPRESSION: VASCULAR  1. Significant focal distal femoropopliteal disease secondary to bulky calcified atherosclerotic plaque. This lesion may be amenable to a combination of orbital atherectomy and angioplasty or primary stenting with a high radial force stent such as Supera. 2. Severe  runoff disease with complete occlusion of the tibioperoneal trunk and proximal occlusion of the anterior tibial artery. The posterior tibial artery does reconstitute above the ankle. Evaluation of the runoff vessels is somewhat limited by poor contrast opacification and artifact from calcified atherosclerotic plaque. Catheter directed angiography may offer better anatomical evaluation. NON VASCULAR  1. Knee joint osteoarthritis most significant in the lateral compartment. 2. Loose bodies are noted within the lateral aspect of the suprapatellar recess.  Signed,  Sterling Big, MD  [facility]  [facility]   Electronically Signed   By: Malachy Moan M.D.   On: 11/30/2014 15:16   Dg Chest Port 1 View  11/29/2014   CLINICAL DATA:  Sepsis. Possible necrotizing fasciitis involving the right great toe. Possible humerus fracture.  EXAM: PORTABLE CHEST - 1 VIEW  COMPARISON:  Right humerus radiographs -11/02/2014; chest radiograph - 10/25/2014; 02/11/2014; 02/03/2014  FINDINGS: Grossly unchanged enlarged cardiac silhouette and mediastinal contours with atherosclerotic plaque within the thoracic aorta. Unchanged retrocardiac opacity favored to represent a hiatal hernia. Mild pulmonary venous congestion without frank evidence of edema. No focal airspace opacities. No pleural effusion or pneumothorax. Unchanged bones including deformity involving the right humeral head as better depicted on right shoulder radiographs performed 11/02/2014  IMPRESSION: 1. Cardiomegaly without acute cardiopulmonary disease. 2. Suspected hiatal hernia. 3. Persistent deformity involving the right humeral head as better depicted on right shoulder radiographs performed 11/02/2014   Electronically Signed   By: Simonne Come M.D.   On: 11/29/2014 09:13   Dg Foot Complete Right  11/28/2014   CLINICAL DATA:  Right toe erythema and discoloration, with pain. Initial encounter.  EXAM: RIGHT FOOT  COMPLETE - 3+ VIEW  COMPARISON:  None.  FINDINGS: Diffuse soft tissue air is noted tracking about the great toe, concerning for necrotizing fasciitis. Underlying lucency within the first distal phalanx raises concern for osteomyelitis. There may be osteomyelitis involving the distal aspect of the first proximal phalanx, though this is difficult to fully characterize on radiograph.  Diffuse vascular calcifications are seen. Visualized joint spaces are grossly unremarkable.  IMPRESSION: 1. Diffuse soft tissue air tracks about the great toe, concerning for necrotizing fasciitis. Underlying lucency within the first distal phalanx raises concern for osteomyelitis. There may be osteomyelitis involving the distal aspect of the first proximal phalanx, though this is difficult to fully characterize on radiograph. 2. Diffuse vascular calcifications seen.  These results were called by telephone at the time of interpretation on 11/28/2014 at 7:02 pm to Dr. Bethann Berkshire, who verbally acknowledged these results.   Electronically Signed   By: Roanna Raider M.D.   On: 11/28/2014 19:06    Microbiology: Recent Results (from the past 240 hour(s))  Blood culture (routine  x 2)     Status: None   Collection Time: 11/28/14  7:25 PM  Result Value Ref Range Status   Specimen Description BLOOD LEFT HAND  Final   Special Requests BOTTLES DRAWN AEROBIC ONLY 2CC  Final   Culture NO GROWTH 5 DAYS  Final   Report Status 12/03/2014 FINAL  Final  Blood culture (routine x 2)     Status: None   Collection Time: 11/28/14  7:39 PM  Result Value Ref Range Status   Specimen Description BLOOD LEFT ARM  Final   Special Requests BOTTLES DRAWN AEROBIC ONLY 3CC  Final   Culture NO GROWTH 5 DAYS  Final   Report Status 12/03/2014 FINAL  Final  MRSA PCR Screening     Status: None   Collection Time: 11/29/14 12:16 AM  Result Value Ref Range Status   MRSA by PCR NEGATIVE NEGATIVE Final    Comment:        The GeneXpert MRSA Assay  (FDA approved for NASAL specimens only), is one component of a comprehensive MRSA colonization surveillance program. It is not intended to diagnose MRSA infection nor to guide or monitor treatment for MRSA infections.   Culture, blood (x 2)     Status: None   Collection Time: 11/29/14  9:50 AM  Result Value Ref Range Status   Specimen Description BLOOD LEFT ANTECUBITAL  Final   Special Requests BOTTLES DRAWN AEROBIC ONLY 4CC  Final   Culture NO GROWTH 5 DAYS  Final   Report Status 12/04/2014 FINAL  Final  Culture, blood (x 2)     Status: None   Collection Time: 11/29/14 10:00 AM  Result Value Ref Range Status   Specimen Description BLOOD BLOOD LEFT HAND  Final   Special Requests BOTTLES DRAWN AEROBIC ONLY [facility]  Final   Culture NO GROWTH 5 DAYS  Final   Report Status 12/04/2014 FINAL  Final     Labs: Basic Metabolic Panel:  Recent Labs Lab 12/03/14 2045 12/04/14 0439 12/04/14 1916 12/08/14 0255  NA 132* 133* 134* 131*  K 4.3 4.4 4.4 4.8  CL 97* 99*  --  98*  CO2 23 26  --  27  GLUCOSE 165* 122* 106* 118*  BUN 13 11  --  16  CREATININE 0.88 0.80  --  0.87  CALCIUM 9.5 9.6  --  9.2   Liver Function Tests:  Recent Labs Lab 12/03/14 2045  AST 23  ALT 15  ALKPHOS 62  BILITOT 0.8  PROT 6.2*  ALBUMIN 2.4*   CBC:  Recent Labs Lab 12/03/14 2045  12/04/14 1915 12/04/14 1916 12/05/14 0347 12/06/14 0135 12/07/14 0450 12/08/14 0255  WBC 16.9*  < > 17.5*  --  13.1* 16.1* 14.5* 11.9*  NEUTROABS 14.8*  --   --   --   --   --   --   --   HGB 10.0*  < > 10.1* 11.2* 8.8* 8.8* 8.0* 9.6*  HCT 30.9*  < > 31.4* 33.0* 26.6* 27.3* 25.1* 30.8*  MCV 96.3  < > 96.0  --  96.7 97.5 99.6 95.1  PLT 289  < > 313  --  256 249 183 193  < > = values in this interval not displayed.   Signed:  Vassie Loll  [facility] 12/08/2014, 10:21 AM

## 2014-12-08 NOTE — Progress Notes (Signed)
PT Cancellation Note  Patient Details Name: Judy Grant MRN: [id number] DOB: [birth year]   Cancelled Treatment:    Reason Eval/Treat Not Completed: Noted plans to d/c to SNF this pm. Spoke with Eileen Stanford, SW and she confirmed that patient is discharging and does not need PT evaluation for discharge.   Lynzee Lindquist 12/08/2014, 12:36 PM  Pager [phone]

## 2014-12-08 NOTE — Significant Event (Signed)
Rapid Response Event Note  Overview:  Called to assist with patient with sudden change in LOC Time Called: 1355 Arrival Time: 1341 Event Type: Neurologic  Initial Focused Assessment:  On arrival patient supine in bed - snoring resps - skin warm and dry - unresponsive to DPS - left arm/leg flacid - right arm in sling - right AKA - DDI - eyes disconjugate gaze - left facial droop noted - no speech - abd soft - bil BS clear - BP 131/79 HR 84 RR 22 O2 sats 100%.  CBG 123.  RN Angelica reports patient was LSW at 1100 when she was alert at her baseline and talking. 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  [facility] ambulance squad here to transport patient to SNF -  Had discharge instructions.  RN was going to give po pain med for discharge transport when patient was found to be unresponsive.  Dr. Gwenlyn Perking alerted.    Interventions:  Neuro exam done - NIHSS 27 - patient with obvious left side flacid - left facial droop - unresponsive to sternal rub - has been receiving Oxycodone po for pain - last dose this AM - creatinine WNL 0.87 this AM. No other new drugs. Patient is on Lovenox bridge to therapeutic INR with Coumadin - INR today 1.26.   HOB elevated to 30 degrees for airway protection.  Stat order for one dose Narcan - IV had been d/c'd for discharge - # 22 angio inserted left forearm times one attempt - stat 0.4 mg Narcan IV given per Angelica RN. Dr. Gwenlyn Perking to bedside - little response to Narcan.  Call to Neuro stroke RN Shanda Bumps - patient with recent AKA,  FTT - resides in SNF - patient transported stat to CT scan - results to Dr. Gwenlyn Perking.  Dr. Roseanne Reno with neurololgy aware of situation - agreed with stat CT scan and then follow.  Tol CT scan.  Back to room - 151/101 HR 86 RR 26 O2 sats 100%.  Will smile at times to voice - left side facial droop evident with smile.  Has cough - suction set up to room - [facility] to remain at 30 degrees for airway.  Opens eyes briefly to sternal rub - grip and release right hand times one -  no other commands.  Left side remains flacid.  Grandchildren at bedside.  Dr. Roseanne Reno updated by stroke RN - he will see.  Dr. Gwenlyn Perking upated.  Handoff to Angelica RN - to call as needed.       Event Summary: Name of Physician Notified: Dr,. Madera at  (pta RRT)  Name of Consulting Physician Notified: Dr. Roseanne Reno at  (by Stroke RN)  Outcome: Stayed in room and stabalized, Code status clarified  Event End Time: 1505  [name]

## 2014-12-08 NOTE — Progress Notes (Signed)
MD Schorr paged in regards to pt unable to take PO meds due to stroke. Pt made NPO. Informed to notify Schorr if HR sustains above 100 to give metoprolol IV. VSS. Will continue to monitor.   Oslo Huntsman, RN

## 2014-12-08 NOTE — Progress Notes (Signed)
CSW informed of canceled discharge- informed facililty.  CSW will continue to follow  Merlyn Lot, [facility] Clinical Social Worker [phone]

## 2014-12-08 NOTE — Progress Notes (Signed)
Event Note:  Patient alert and oriented X 3 at 1130 when this RN received report and took over her care.  At ~1230 patient found asleep with granddaughter at bedside.  Attempted to arouse patient to offer pain medication as the patient transport to SNF via EMS was scheduled for 1pm.  Patient difficult to arouse at that time. Granddaughter reported that patient ate lunch but took only a few bites.  Report called to [facility], [employer], who requested that patient be aroused and given pain medication prior to transport because of the length of time it takes them to receive orders for pain medications.  Returned at 1315 to offer pain medication again but patient still asleep and difficult to arouse.  Shortly thereafter, Toya, NT arrived to check and clean patient in preparation for her discharge.  Tele and IV discontinued.  Patient dressed in paper gown.  Upon being turned, the patient opened her eyes but did not complain verbally or respond to questions.  Facial droop appeared more pronounced.  Upon further assessment, the patient only responded with grimacing and clutching her gown with her right hand.  Left UE flaccid.  Left foot with dorsiflexion to pain.  Vital signs taken. Patient placed back on telemetry.  Rapid Response RN, Stanton Kidney, notified and came to patient's bedside.  Dr Gwenlyn Perking also notified and came to patient's bedside. Narcan given per MD order with no response.  Patient taken for HCT.  Patient's granddaughter notified other family members who are now at patient's bedside.

## 2014-12-08 NOTE — Progress Notes (Signed)
Patient will discharge to [facility] Anticipated discharge date:12/08/14 Family notified:[facility] by [facility]- scheduled at 1pm  CSW signing off.  Merlyn Lot, LCSWA Clinical Social Worker [phone]

## 2014-12-08 NOTE — Progress Notes (Signed)
Patient with drastic change in mentation; increase pronunciation on facial droop and flaccid LUE. Very minimal response to pain stimulation, unable to follow commands and non verbal currently. High concerns for stroke.   Plan: -will check acute CT head w/o contrast  -narcan 0.4 X 1 given; no significant change -protecting airways and overall with stable VS -will cancer discharge -follow results.  Vassie Loll [phone]

## 2014-12-08 NOTE — Progress Notes (Signed)
ANTICOAGULATION CONSULT NOTE - Follow Up Consult  Pharmacy Consult for Lovenox and Coumadin Indication: atrial fibrillation  No Known Allergies  Patient Measurements: Weight: 129 lb 6.6 oz (58.7 kg)  Vital Signs: Temp: 98.2 F (36.8 C) (09/12 0353) Temp Source: Oral (09/12 0353) BP: 134/77 mmHg (09/12 0353) Pulse Rate: 87 (09/12 0353)  Labs:  Recent Labs  12/06/14 0135 12/06/14 1140 12/06/14 1905 12/07/14 0450 12/08/14 0255  HGB 8.8*  --   --  8.0* 9.6*  HCT 27.3*  --   --  25.1* 30.8*  PLT 249  --   --  183 193  LABPROT 17.4*  --   --  17.2* 16.0*  INR 1.41  --   --  1.39 1.26  HEPARINUNFRC 0.14* 0.56 0.58 0.77*  --   CREATININE  --   --   --   --  0.87    Estimated Creatinine Clearance: 35.8 mL/min (by C-G formula based on Cr of 0.87).   Assessment: 89yof on coumadin pta for afib, admitted with right toe gangrene. INR 2.6 on admission, however, coumadin held and she received  vitamin k to reverse INR for possible surgery. Heparin bridge was started on 9/4. She underwent right BKA on 9/8 and heparin and coumadin resumed 9/9. Heparin will now be switched to therapeutic lovenox anticipating discharge tomorrow. INR remains below goal and not moving much which could be due to amount of vitamin k in her ensure supplements. No bleeding noted.   Home coumadin dose: 3.5mg  daily except  on Monday  Goal of Therapy:  INR 2-3 Monitor platelets by anticoagulation protocol: Yes   Plan:  1) Continue Lovenox  sq q12 2) Repeat coumadin to 7.5mg  x 1 3) INR in AM *If discharged today, would give 7.5mg  today then resume home dose with INR check Wed or Thurs  Terrionna Bridwell, Drake Leach 12/08/2014,7:26 AM

## 2014-12-08 NOTE — Progress Notes (Signed)
HR 110s-120s. Schorr notified and orders were given. Will continue to monitor.   Jessamy Torosyan, RN

## 2014-12-08 NOTE — Consult Note (Addendum)
Admission H&P    Chief Complaint: Altered mental status and left hemiparesthesias.  HPI: Judy Grant is an 81 y.o. female with a history of previous stroke, atrial fibrillation and valvular heart disease on anticoagulation, hypertension, hyperlipidemia, chronic kidney disease and peripheral vascular disease who is status post right BKA amputation on 12/04/2014. Patient was noted to be minimally responsive and flaccid on the left side as well as having left facial droop around 12:30 PM today. She was last known well at 11 AM. She was given Narcan with no noticeable improvement in responsiveness. CT scan of her head was obtained which showed no acute intracranial abnormality. She is currently on anticoagulation with Lovenox and Coumadin has been as well. INR was 1.26. She was not deemed a candidate for TPA because of major surgery 4 days ago, as well as being anticoagulated with Lovenox.  LSN: 11:00 AM on 12/08/2014 tPA Given: No: Major surgery on 12/04/2014; anticoagulated with Lovenox mRankin:  Past Medical History  Diagnosis Date  . Hyperlipidemia   . Osteoporosis   . Hypertension     with severe left ventricular hypertrophy; normal ejection fraction in 04/2005  . Valvular heart disease     Mild to moderate aortic stenosis; moderate to severe mitral stenosis in 2007; mild MR  . Paroxysmal atrial fibrillation     Remote  . Diverticulosis     Pancolonic; h/o LGI bleeding and diverticulitis  . Degenerative joint disease     Knees  . Chronic kidney disease     Mild; creatinine of 1.19-1.28 in recent years  . Anemia, iron deficiency   . Popliteal cyst     Left  . Fracture of wrist 2008    left  . Gout   . Stroke right side deficit  . NSTEMI, initial episode of care 01/05/2012  . Chronic combined systolic and diastolic congestive heart failure 02/2013    EF 16%; Grade 2 diastolic dys    Past Surgical History  Procedure Laterality Date  . Appendectomy  1944  . Total hip  arthroplasty  1994  . Amputation Right 12/04/2014    Procedure: AMPUTATION BELOW KNEE;  Surgeon: Newt Minion, MD;  Location: [facility];  Service: Orthopedics;  Laterality: Right;    Family History  Problem Relation Age of Onset  . Diabetes Mother    Social History:  reports that she quit smoking about 42 years ago. Her smoking use included Cigarettes. She has never used smokeless tobacco. She reports that she does not drink alcohol or use illicit drugs.  Allergies: No Known Allergies  Medications Prior to Admission  Medication Sig Dispense Refill  . allopurinol (ZYLOPRIM) 100 MG tablet TAKE ONE TABLET BY MOUTH ONCE DAILY. 30 tablet 3  . Amino Acids-Protein Hydrolys (FEEDING SUPPLEMENT, PRO-STAT SUGAR FREE 64,) LIQD Take 30 mLs by mouth 2 (two) times daily with a meal.     . atorvastatin (LIPITOR) 10 MG tablet Take 10 mg by mouth daily at 6 PM.    . collagenase (SANTYL) ointment Apply 1 application topically daily.    . furosemide (LASIX) 40 MG tablet TAKE ONE TABLET BY MOUTH ONCE DAILY. 30 tablet 0  . metoprolol (LOPRESSOR) 50 MG tablet Take 50 mg by mouth 2 (two) times daily.     . ondansetron (ZOFRAN) 4 MG tablet Take 4 mg by mouth every 8 (eight) hours as needed for nausea or vomiting.    . pantoprazole (PROTONIX) 20 MG tablet Take 20 mg by mouth 2 (two) times  daily.    . Wheat Dextrin (BENEFIBER PO) Take 1 each by mouth daily.    . acetaminophen (TYLENOL) 500 MG tablet Take 1 tablet (500 mg total) by mouth every 8 (eight) hours. 30 tablet 0  . alendronate (FOSAMAX) 70 MG tablet TAKE 1 TAB EACH WEEK 30 MIN PRIOR TO BREAKFAST WITH LARGE GLASS OF WATER. REMAIN UPRIGHT. (Patient taking differently: TAKE 1 TAB EACH WEEK 30 MIN PRIOR TO BREAKFAST WITH LARGE GLASS OF WATER. REMAIN UPRIGHT. Takes on Friday) 4 tablet 3  . ASPIRIN LOW DOSE 81 MG EC tablet TAKE ONE TABLET BY MOUTH ONCE DAILY. 30 tablet 3  . benzonatate (TESSALON) 100 MG capsule Take 1 capsule (100 mg total) by mouth 3 (three) times  daily as needed for cough. 20 capsule 0  . Calcium Carb-Cholecalciferol 500-400 MG-UNIT TABS TAKE (1) TABLET BY MOUTH (3) TIMES DAILY. 90 tablet 6  . cefUROXime (CEFTIN) 250 MG tablet Take 1 tablet (250 mg total) by mouth 2 (two) times daily with a meal. 8 tablet 0  . COLACE 50 MG capsule TAKE (1) CAPSULE BY MOUTH TWICE DAILY. 60 capsule 3  . diclofenac sodium (VOLTAREN) 1 % GEL Apply 2 g topically 3 (three) times daily. 1 Tube 0  . feeding supplement, ENSURE COMPLETE, (ENSURE COMPLETE) LIQD Take 237 mLs by mouth 2 (two) times daily between meals.    . fluticasone (FLONASE) 50 MCG/ACT nasal spray USE 1 SPRAY IN EACH NOSTRIL ONCE DAILY. 16 g 2  . HYDROcodone-acetaminophen (NORCO/VICODIN) 5-325 MG per tablet Take 1 tablet by mouth every 6 (six) hours as needed (pain). 30 tablet 0  . menthol-cetylpyridinium (CEPACOL) 3 MG lozenge Take 1 lozenge (3 mg total) by mouth as needed for sore throat. 100 tablet 12  . potassium chloride (K-DUR) 10 MEQ tablet TAKE 1 TABLET BY MOUTH ON MONDAY,WEDNESDAY AND FRIDAY. 12 tablet 3  . simvastatin (ZOCOR) 20 MG tablet TAKE (1) TABLET BY MOUTH AT BEDTIME FOR CHOLESTEROL. 30 tablet 3  . warfarin (COUMADIN) 1 MG tablet TAKE 1 TABLET BY MOUTH ONCE A DAY,ALONG WITH 2.5 MG DAILY TO EQUAL 3.5 MG. (Patient not taking: Reported on 02/11/2014) 30 tablet 3  . warfarin (COUMADIN) 2.5 MG tablet TAKE 1 TABLET BY MOUTH ONCE A DAY,ALONG WITH 1 MG TO EQUAL 3.5 MG DAILY. (Patient not taking: Reported on 02/11/2014) 30 tablet 3    ROS: History obtained from chart review.  General ROS: Sepsis with gangrene of right foot at time of admission Psychological ROS: negative for - behavioral disorder, hallucinations, memory difficulties, mood swings or suicidal ideation Ophthalmic ROS: negative for - blurry vision, double vision, eye pain or loss of vision ENT ROS: negative for - epistaxis, nasal discharge, oral lesions, sore throat, tinnitus or vertigo Allergy and Immunology ROS: negative  for - hives or itchy/watery eyes Hematological and Lymphatic ROS: negative for - bleeding problems, bruising or swollen lymph nodes Endocrine ROS: negative for - galactorrhea, hair pattern changes, polydipsia/polyuria or temperature intolerance Respiratory ROS: negative for - cough, hemoptysis, shortness of breath or wheezing Cardiovascular ROS: negative for - chest pain, dyspnea on exertion, edema or irregular heartbeat Gastrointestinal ROS: negative for - abdominal pain, diarrhea, hematemesis, nausea/vomiting or stool incontinence Genito-Urinary ROS: negative for - dysuria, hematuria, incontinence or urinary frequency/urgency Musculoskeletal ROS: As noted in present illness Neurological ROS: as noted in HPI Dermatological ROS: negative for rash and skin lesion changes  Physical Examination: Blood pressure 151/101, pulse 86, temperature 98.2 F (36.8 C), temperature source Axillary, resp. rate 18,   weight 58.7 kg (129 lb 6.6 oz), SpO2 100 %.  HEENT-  Normocephalic, no lesions, without obvious abnormality.  Normal external eye and conjunctiva.  Normal TM's bilaterally.  Normal auditory canals and external ears. Normal external nose, mucus membranes and septum.  Normal pharynx. Neck supple with no masses, nodes, nodules or enlargement. Cardiovascular - irregularly irregular rhythm and systolic murmur: early systolic 2/6, crescendo at 2nd right intercostal space Lungs - chest clear, no wheezing, rales, normal symmetric air entry Abdomen - soft, non-tender; bowel sounds normal; no masses,  no organomegaly Extremities - right BK amputation; dressing dry  Neurologic Examination: Patient was obtunded and minimally responsive to noxious stimuli. Pupils were equal and reacted normally to light. Extraocular movements were intact and conjugate with oculocephalic maneuvers. Mild left lower facial droop noted. Muscle tone was flaccid throughout. Patient had minimal withdrawal movement of left lower  extremity and no withdrawal movements to noxious stimuli of left upper extremity. Left knee jerk was 1+. Plantar response on the left was extensor.  Results for orders placed or performed during the hospital encounter of 11/28/14 (from the past 48 hour(s))  Heparin level (unfractionated)     Status: None   Collection Time: 12/06/14  7:05 PM  Result Value Ref Range   Heparin Unfractionated 0.58 0.30 - 0.70 IU/mL    Comment:        IF HEPARIN RESULTS ARE BELOW EXPECTED VALUES, AND PATIENT DOSAGE HAS BEEN CONFIRMED, SUGGEST FOLLOW UP TESTING OF ANTITHROMBIN III LEVELS.   CBC     Status: Abnormal   Collection Time: 12/07/14  4:50 AM  Result Value Ref Range   WBC 14.5 (H) 4.0 - 10.5 K/uL   RBC 2.52 (L) 3.87 - 5.11 MIL/uL   Hemoglobin 8.0 (L) 12.0 - 15.0 g/dL   HCT 25.1 (L) 36.0 - 46.0 %   MCV 99.6 78.0 - 100.0 fL   MCH 31.7 26.0 - 34.0 pg   MCHC 31.9 30.0 - 36.0 g/dL   RDW 14.4 11.5 - 15.5 %   Platelets 183 150 - 400 K/uL  Heparin level (unfractionated)     Status: Abnormal   Collection Time: 12/07/14  4:50 AM  Result Value Ref Range   Heparin Unfractionated 0.77 (H) 0.30 - 0.70 IU/mL    Comment:        IF HEPARIN RESULTS ARE BELOW EXPECTED VALUES, AND PATIENT DOSAGE HAS BEEN CONFIRMED, SUGGEST FOLLOW UP TESTING OF ANTITHROMBIN III LEVELS.   Protime-INR     Status: Abnormal   Collection Time: 12/07/14  4:50 AM  Result Value Ref Range   Prothrombin Time 17.2 (H) 11.6 - 15.2 seconds   INR 1.39 0.00 - 1.49  Type and screen     Status: None   Collection Time: 12/07/14  3:40 PM  Result Value Ref Range   ABO/RH(D) A POS    Antibody Screen POS    Sample Expiration 12/10/2014    Antibody Identification ANTI E ANTI FYA (Duffy a)    DAT, IgG NEG    Unit Number [id number]    Blood Component Type RED CELLS,LR    Unit division 00    Status of Unit ISSUED,FINAL    Donor AG Type      NEGATIVE FOR E ANTIGEN NEGATIVE FOR DUFFY A ANTIGEN   Transfusion Status OK TO TRANSFUSE     Crossmatch Result COMPATIBLE   Prepare RBC     Status: None   Collection Time: 12/07/14  3:40 PM  Result Value Ref Range     Order Confirmation ORDER PROCESSED BY BLOOD BANK   Protime-INR     Status: Abnormal   Collection Time: 12/08/14  2:55 AM  Result Value Ref Range   Prothrombin Time 16.0 (H) 11.6 - 15.2 seconds   INR 1.26 0.00 - 1.49  CBC     Status: Abnormal   Collection Time: 12/08/14  2:55 AM  Result Value Ref Range   WBC 11.9 (H) 4.0 - 10.5 K/uL   RBC 3.24 (L) 3.87 - 5.11 MIL/uL   Hemoglobin 9.6 (L) 12.0 - 15.0 g/dL   HCT 30.8 (L) 36.0 - 46.0 %   MCV 95.1 78.0 - 100.0 fL   MCH 29.6 26.0 - 34.0 pg   MCHC 31.2 30.0 - 36.0 g/dL   RDW 15.9 (H) 11.5 - 15.5 %   Platelets 193 150 - 400 K/uL  Basic metabolic panel     Status: Abnormal   Collection Time: 12/08/14  2:55 AM  Result Value Ref Range   Sodium 131 (L) 135 - 145 mmol/L   Potassium 4.8 3.5 - 5.1 mmol/L   Chloride 98 (L) 101 - 111 mmol/L   CO2 27 22 - 32 mmol/L   Glucose, Bld 118 (H) 65 - 99 mg/dL   BUN 16 6 - 20 mg/dL   Creatinine, Ser 0.87 0.44 - 1.00 mg/dL   Calcium 9.2 8.9 - 10.3 mg/dL   GFR calc non Af Amer 55 (L) >60 mL/min   GFR calc Af Amer >60 >60 mL/min    Comment: (NOTE) The eGFR has been calculated using the CKD EPI equation. This calculation has not been validated in all clinical situations. eGFR's persistently <60 mL/min signify possible Chronic Kidney Disease.    Anion gap 6 5 - 15  Glucose, capillary     Status: Abnormal   Collection Time: 12/08/14  1:59 PM  Result Value Ref Range   Glucose-Capillary 123 (H) 65 - 99 mg/dL   Ct Head Wo Contrast  12/08/2014   CLINICAL DATA:  Acute altered mental status and left-sided weakness. 11/02/2014 and prior head CTs dating back to 01/05/2012  EXAM: CT HEAD WITHOUT CONTRAST  TECHNIQUE: Contiguous axial images were obtained from the base of the skull through the vertex without intravenous contrast.  COMPARISON:  11/02/2014 and prior exams  FINDINGS: Atrophy and  chronic small-vessel white matter ischemic changes are again identified.  No acute intracranial abnormalities are identified, including mass lesion or mass effect, hydrocephalus, extra-axial fluid collection, midline shift, hemorrhage, or acute infarction.  The visualized bony calvarium is unremarkable.  IMPRESSION: No evidence of acute intracranial abnormality.  Atrophy and chronic small-vessel white matter ischemic changes.   Electronically Signed   By: Jeffrey  Hu M.D.   On: 12/08/2014 15:08    Assessment: 81 y.o. female with multiple risk factors for stroke with probable acute right MCA territory ischemic infarction, likely embolic given her history of atrial fibrillation.  Stroke Risk Factors - atrial fibrillation, hyperlipidemia and hypertension, DM  Plan: 1. HgbA1c, fasting lipid panel 2. MRI, MRA  of the brain without contrast 3. PT consult, OT consult, Speech consult 4. Echocardiogram 5. Carotid dopplers 6. Prophylactic therapy-Anticoagulation: Coumadin with Lovenox bridge until INR is therapeutic 7. Risk factor modification 8. Telemetry monitoring  C.R. , MD [facility] [phone]  12/08/2014, 5:07 PM     

## 2014-12-08 NOTE — Progress Notes (Signed)
MD Amada Jupiter paged in regards to MRI results. MD Amada Jupiter confirmed. No new orders at this time. Will continue to monitor.   Ashritha Desrosiers, RN

## 2014-12-08 NOTE — Care Management Note (Signed)
Case Management Note  Patient Details  Name: Judy Grant MRN: [id number] Date of Birth: Oct 24, 1917  Subjective/Objective:    Pt admitted with gangrene of foot.  Pt underwent amputation this admit               Action/Plan:  Pt was originally from home but will need to go to SNF post discharge.     Expected Discharge Date:                  Expected Discharge Plan:  Skilled Nursing Facility  In-House Referral:  Clinical Social Work  Discharge planning Services     Post Acute Care Choice:    Choice offered to:     DME Arranged:    DME Agency:     HH Arranged:    HH Agency:     Status of Service:  Completed, signed off  Medicare Important Message Given:  Yes-fourth notification given Date Medicare IM Given:    Medicare IM give by:    Date Additional Medicare IM Given:    Additional Medicare Important Message give by:     If discussed at Long Length of Stay Meetings, dates discussed:    Additional Comments: Pt will discharge to SNF today 12/08/14 Cherylann Parr, RN 12/08/2014, 11:42 AM

## 2014-12-09 ENCOUNTER — Encounter (HOSPITAL_COMMUNITY): Payer: Self-pay

## 2014-12-09 DIAGNOSIS — I6340 Cerebral infarction due to embolism of unspecified cerebral artery: Secondary | ICD-10-CM | POA: Insufficient documentation

## 2014-12-09 LAB — PROTIME-INR
INR: 1.38 (ref 0.00–1.49)
Prothrombin Time: 17.1 seconds — ABNORMAL HIGH (ref 11.6–15.2)

## 2014-12-09 MED ORDER — METOPROLOL TARTRATE 1 MG/ML IV SOLN
5.0000 mg | Freq: Once | INTRAVENOUS | Status: AC
  Administered 2014-12-09: 5 mg via INTRAVENOUS
  Filled 2014-12-09: qty 5

## 2014-12-09 MED ORDER — LORAZEPAM 2 MG/ML IJ SOLN
1.0000 mg | INTRAMUSCULAR | Status: DC | PRN
  Administered 2014-12-09: 1 mg via INTRAVENOUS
  Filled 2014-12-09: qty 1

## 2014-12-09 MED ORDER — BISACODYL 10 MG RE SUPP: 10.0000 mg | Freq: Every day | RECTAL | Status: DC | PRN

## 2014-12-09 MED ORDER — METOPROLOL TARTRATE 1 MG/ML IV SOLN
2.5000 mg | Freq: Three times a day (TID) | INTRAVENOUS | Status: DC
  Administered 2014-12-09: 2.5 mg via INTRAVENOUS
  Filled 2014-12-09 (×4): qty 5

## 2014-12-09 MED ORDER — SCOPOLAMINE 1 MG/3DAYS TD PT72
1.0000 | MEDICATED_PATCH | TRANSDERMAL | Status: DC
  Administered 2014-12-09: 1.5 mg via TRANSDERMAL
  Filled 2014-12-09 (×2): qty 1

## 2014-12-09 MED ORDER — MORPHINE SULFATE (PF) 2 MG/ML IV SOLN
2.0000 mg | INTRAVENOUS | Status: DC | PRN
  Administered 2014-12-09: 2 mg via INTRAVENOUS
  Filled 2014-12-09: qty 1

## 2014-12-09 NOTE — Progress Notes (Signed)
[facility] PROGRESS NOTE Assessment/Plan: Sepsis due to cellulitis with Gangrene of foot: -no fever and no signs of systemic infection after amputation.  -Status post right transtibial amputation on 9/8 -continue comfort measures only -patient with large right MCA stroke and has remained essentially unresponsive and unable to follow commands or take anything by mouth -Plan of care is full comfort  Preop Evaluation/ EOL: - Due to age and multiple comorbidities, chronic kidney disease, atrial fibrillation on Coumadin, aortic stenosis   She is high risk for cardiopulmonary complications. Family and patient understand and they would like to proceed with surgery. -2-D echo demonstrating worsening on aortic stenosis in compare to echo in 2004; preserved EF, no wall motion abnormalities  -tolerated surgery without significant complication; but on 12/08/14 ended having acute right MCA stroke. Family not looking for invasive therapy and inclined to palliative/hospice care -will use PRN morphine, ativan and haldol -patient PRN dulcolax and also initiate use of scopolamine patch -will observe for stability in the next 24 hours and if stable will proceed to transfer to residential hospice for further comfort care   Long term current use of anticoagulant therapy/  Paroxysmal atrial fibrillation: -CHADS VASC2 score is 6. -given acute stroke and risk for conversion, plus inability to take anything by mouth, rec's has been for termination of anticoagulation   Chronic kidney disease stage 3-4: -Creatinine at 1.2-1.3 -essentially at baseline by GFR -will no check any further labs -Plan of care is full comfort care  Essential hypertension: -Continue metoprolol IV Q8 hours -mainly to control HR  Severe protein caloric malnutrition: -Comfort feeding -but patient unresponsive   Acute blood loss anemia -last Hgb 9.6 -will hold on checking any further blood work -plan of care is full  comfort -patient received 1 unit of PRBC during this admission   Acute right MCA large embolic infarct  -secondary to known A. Fib -at this moment no anticoagulation would be offered -overall really poor prognosis -family in agreement with palliative/hospice care -no artificial feeding   Code Status: DNR/DNI Family Communication: daughter  Disposition Plan: remains inpatient; surgery anticipated for tomorrow 9/8 (after 5pm); SNF at discharge anticipated    Consultants:  Ortho (Dr. Lajoyce Corners)  Vascular surgery  (Dr. Edilia Bo)  Procedures:  CXR  Right transtibial amputation (9/8)  Antibiotics:  vanc and rocephin  doxycycline  HPI/Subjective: Afebrile. Unresponsive and non-verbal. Patient not eating or drinking. Appears overall comfortable. Positive increase EOL secretions   Objective: Filed Vitals:   12/08/14 1507 12/08/14 1837 12/08/14 2020 12/09/14 0255  BP: 151/101 158/83 146/65 135/80  Pulse: 86 95 95 104  Temp:  98.5 F (36.9 C) 98.4 F (36.9 C) 98.7 F (37.1 C)  TempSrc:  Axillary Axillary Axillary  Resp:  19 18 18   Weight:      SpO2: 100%  100% 100%    Intake/Output Summary (Last 24 hours) at 12/09/14 1114 Last data filed at 12/09/14 0830  Gross per 24 hour  Intake      0 ml  Output      4 ml  Net     -4 ml   Filed Weights   11/28/14 2237  Weight: 58.7 kg (129 lb 6.6 oz)    Exam:  General: unable to follow commands and non-verbal. Patient with acute right side stroke on 12/08/14; no acute distress. Slight increase EOL secretions appreciated on exam. Appears comfortable. Unable to eat or drink anything. Family in agreement with comfort care and no artificial intervention.   HEENT:  No bruits, no goiter, no JVD Heart: positive SEM, slightly tachycardic; A. Fib per telemetry  Lungs: Good air movement, no crackles, no wheezing Abdomen: Soft, nontender, nondistended, positive bowel sounds.  Neuro: Grossly intact, nonfocal. MSK: Status post right  transtibial amputation. Dressings in place clean and dry. No swelling and no signs of cyanosis or necrosis.   Data Reviewed: Basic Metabolic Panel:  Recent Labs Lab 12/03/14 2045 12/04/14 0439 12/04/14 1916 12/08/14 0255  NA 132* 133* 134* 131*  K 4.3 4.4 4.4 4.8  CL 97* 99*  --  98*  CO2 23 26  --  27  GLUCOSE 165* 122* 106* 118*  BUN 13 11  --  16  CREATININE 0.88 0.80  --  0.87  CALCIUM 9.5 9.6  --  9.2   Liver Function Tests:  Recent Labs Lab 12/03/14 2045  AST 23  ALT 15  ALKPHOS 62  BILITOT 0.8  PROT 6.2*  ALBUMIN 2.4*   CBC:  Recent Labs Lab 12/03/14 2045  12/04/14 1915 12/04/14 1916 12/05/14 0347 12/06/14 0135 12/07/14 0450 12/08/14 0255  WBC 16.9*  < > 17.5*  --  13.1* 16.1* 14.5* 11.9*  NEUTROABS 14.8*  --   --   --   --   --   --   --   HGB 10.0*  < > 10.1* 11.2* 8.8* 8.8* 8.0* 9.6*  HCT 30.9*  < > 31.4* 33.0* 26.6* 27.3* 25.1* 30.8*  MCV 96.3  < > 96.0  --  96.7 97.5 99.6 95.1  PLT 289  < > 313  --  256 249 183 193  < > = values in this interval not displayed.  CBG:  Recent Labs Lab 12/08/14 1359  GLUCAP 123*   Studies: Ct Head Wo Contrast  12/08/2014   CLINICAL DATA:  Acute altered mental status and left-sided weakness. 11/02/2014 and prior head CTs dating back to 01/05/2012  EXAM: CT HEAD WITHOUT CONTRAST  TECHNIQUE: Contiguous axial images were obtained from the base of the skull through the vertex without intravenous contrast.  COMPARISON:  11/02/2014 and prior exams  FINDINGS: Atrophy and chronic small-vessel white matter ischemic changes are again identified.  No acute intracranial abnormalities are identified, including mass lesion or mass effect, hydrocephalus, extra-axial fluid collection, midline shift, hemorrhage, or acute infarction.  The visualized bony calvarium is unremarkable.  IMPRESSION: No evidence of acute intracranial abnormality.  Atrophy and chronic small-vessel white matter ischemic changes.   Electronically Signed   By:  Harmon Pier M.D.   On: 12/08/2014 15:08   Mr Brain Wo Contrast  12/08/2014   CLINICAL DATA:  81 year old female with atrial fibrillation, high blood pressure and hyperlipidemia presenting with left-sided weakness and left-sided facial droop. Subsequent encounter.  EXAM: MRI HEAD WITHOUT CONTRAST  TECHNIQUE: Multiplanar, multiecho pulse sequences of the brain and surrounding structures were obtained without intravenous contrast.  COMPARISON:  12/08/2014 head CT.  01/06/2012 brain MR.  FINDINGS: Large nonhemorrhagic right hemispheric infarct involving the right middle cerebral artery distribution (right temporal lobe, right subinsular region, right operculum region, right frontal lobe, right parietal lobe and right corona radiata).  Thrombus suspected within the right middle cerebral artery proximal M2 branch.  Small vessel disease type changes.  Global atrophy without hydrocephalus.  No intracranial mass lesion noted on this unenhanced exam.  Cervical spondylotic changes with spinal stenosis and cord flattening C2-3 and C3-4 level.  Cervical medullary junction, pituitary region and pineal region unremarkable.  Mild exophthalmos.  IMPRESSION: Large nonhemorrhagic right  hemispheric infarct involving the right middle cerebral artery distribution (right temporal lobe, right subinsular region, right operculum region, right frontal lobe, right parietal lobe and right corona radiata).  Thrombus suspected within the right middle cerebral artery proximal M2 branch.  Small vessel disease type changes.  Global atrophy without hydrocephalus.  Cervical spondylotic changes with spinal stenosis and cord flattening C2-3 and C3-4 level.  These results will be called to the ordering clinician or representative by the Radiologist Assistant, and communication documented in the PACS or zVision Dashboard.   Electronically Signed   By: Lacy Duverney M.D.   On: 12/08/2014 20:38    Scheduled Meds: . sodium chloride   Intravenous Once   . metoprolol  2.5 mg Intravenous 3 times per day  . scopolamine  1 patch Transdermal Q72H   Continuous Infusions: . sodium chloride 10 mL/hr at 12/04/14 2122  . lactated ringers 10 mL/hr at 12/04/14 1728    Time Spent:  30 min   Vassie Loll  [facility] Pager [phone].  If 7PM-7AM, please contact night-coverage at [email or web address], password [facility] 12/09/2014, 11:14 AM  LOS: 11 days

## 2014-12-09 NOTE — Progress Notes (Signed)
CSW informed of plan for pt to transition to Hospice care- CSW spoke with Rosey Bath at bedside to discuss plan for residential hospice.  Pt daugther would like [facility] as first choice and [facility] as alternate choice.   CSW made referral to [facility]- hospital liaison to follow up  Merlyn Lot, [facility] Clinical Social Worker [phone]

## 2014-12-09 NOTE — Progress Notes (Signed)
STROKE TEAM PROGRESS NOTE   SUBJECTIVE (INTERVAL HISTORY) Families are at the bedside.  Overall she feels her condition is unchanged. She is not responsive or open eyes on voice stimulation. LUE and LLE falccid, RLE s/p BKA and RUE in sling. Had long discussion with family at bedside and they are in agreement with palliative care.    OBJECTIVE Temp:  [98.2 F (36.8 C)-98.7 F (37.1 C)] 98.7 F (37.1 C) (09/13 0255) Pulse Rate:  [80-104] 104 (09/13 0255) Cardiac Rhythm:  [-] Atrial fibrillation (09/13 0700) Resp:  [18-19] 18 (09/13 0255) BP: (127-158)/(65-101) 135/80 mmHg (09/13 0255) SpO2:  [90 %-100 %] 100 % (09/13 0255)   Recent Labs Lab 12/08/14 1359  GLUCAP 123*    Recent Labs Lab 12/03/14 2045 12/04/14 0439 12/04/14 1916 12/08/14 0255  NA 132* 133* 134* 131*  K 4.3 4.4 4.4 4.8  CL 97* 99*  --  98*  CO2 23 26  --  27  GLUCOSE 165* 122* 106* 118*  BUN 13 11  --  16  CREATININE 0.88 0.80  --  0.87  CALCIUM 9.5 9.6  --  9.2    Recent Labs Lab 12/03/14 2045  AST 23  ALT 15  ALKPHOS 62  BILITOT 0.8  PROT 6.2*  ALBUMIN 2.4*    Recent Labs Lab 12/03/14 2045  12/04/14 1915 12/04/14 1916 12/05/14 0347 12/06/14 0135 12/07/14 0450 12/08/14 0255  WBC 16.9*  < > 17.5*  --  13.1* 16.1* 14.5* 11.9*  NEUTROABS 14.8*  --   --   --   --   --   --   --   HGB 10.0*  < > 10.1* 11.2* 8.8* 8.8* 8.0* 9.6*  HCT 30.9*  < > 31.4* 33.0* 26.6* 27.3* 25.1* 30.8*  MCV 96.3  < > 96.0  --  96.7 97.5 99.6 95.1  PLT 289  < > 313  --  256 249 183 193  < > = values in this interval not displayed. No results for input(s): CKTOTAL, CKMB, CKMBINDEX, TROPONINI in the last 168 hours.  Recent Labs  12/07/14 0450 12/08/14 0255 12/09/14 0452  LABPROT 17.2* 16.0* 17.1*  INR 1.39 1.26 1.38   No results for input(s): COLORURINE, LABSPEC, PHURINE, GLUCOSEU, HGBUR, BILIRUBINUR, KETONESUR, PROTEINUR, UROBILINOGEN, NITRITE, LEUKOCYTESUR in the last 72 hours.  Invalid input(s):  APPERANCEUR     Component Value Date/Time   CHOL 128 10/21/2013 1048   TRIG 55 10/21/2013 1048   HDL 67 10/21/2013 1048   CHOLHDL 1.9 10/21/2013 1048   VLDL 11 10/21/2013 1048   LDLCALC 50 10/21/2013 1048   Lab Results  Component Value Date   HGBA1C 5.9* 01/06/2012      Component Value Date/Time   LABOPIA POSITIVE* 02/11/2014 1222   COCAINSCRNUR NONE DETECTED 02/11/2014 1222   LABBENZ NONE DETECTED 02/11/2014 1222   AMPHETMU NONE DETECTED 02/11/2014 1222   THCU NONE DETECTED 02/11/2014 1222   LABBARB NONE DETECTED 02/11/2014 1222    No results for input(s): ETH in the last 168 hours.  I have personally reviewed the radiological images below and agree with the radiology interpretations.  Ct Head Wo Contrast  12/08/2014    IMPRESSION: No evidence of acute intracranial abnormality.  Atrophy and chronic small-vessel white matter ischemic changes.      Mr Brain Wo Contrast  12/08/2014   IMPRESSION: Large nonhemorrhagic right hemispheric infarct involving the right middle cerebral artery distribution (right temporal lobe, right subinsular region, right operculum region, right frontal lobe, right  parietal lobe and right corona radiata).  Thrombus suspected within the right middle cerebral artery proximal M2 branch.  Small vessel disease type changes.  Global atrophy without hydrocephalus.  Cervical spondylotic changes with spinal stenosis and cord flattening C2-3 and C3-4 level.       Carotid Doppler  Cancelled due to poor prognosis and possible palliative care  2D Echocardiogram  - Left ventricle: The cavity size was normal. Systolic function was vigorous. The estimated ejection fraction was in the range of 65% to 70%. Wall motion was normal; there were no regional wall motion abnormalities. - Aortic valve: Trileaflet; moderately thickened, moderately calcified leaflets. Valve mobility was restricted. There was moderate to severe stenosis. Peak velocity (S): 395 cm/s.  Mean gradient (S): 35 mm Hg. - Mitral valve: Moderately calcified annulus. Moderately calcified leaflets . The findings are consistent with moderate stenosis. There was mild regurgitation. Mean gradient (D): 8 mm Hg. Valve area by continuity equation (using LVOT flow): 1.15 cm^2. - Left atrium: The atrium was moderately dilated. - Right atrium: The atrium was mildly dilated. - Tricuspid valve: There was moderate regurgitation.  Impressions:  - When compared to prior echocardiogram, aortic stenosis has advanced (prior mean gradient )   PHYSICAL EXAM  Temp:  [98.2 F (36.8 C)-98.7 F (37.1 C)] 98.7 F (37.1 C) (09/13 0255) Pulse Rate:  [80-104] 104 (09/13 0255) Resp:  [18-19] 18 (09/13 0255) BP: (127-158)/(65-101) 135/80 mmHg (09/13 0255) SpO2:  [90 %-100 %] 100 % (09/13 0255)  General - thin built, well developed, not open eyes on voice and pain stimulation.  Ophthalmologic - not able to test due to noncooperation  Cardiovascular - irregularly irregular heart rate and rhythm.  Neuro - not open eyes on voice or pain stimulation, eyes has right preference, doll's eye present, PERRL, left nasolabial fold flattening. With eyes forced open, mild blink to visual threat on the right but not on the left. Left UE flaccid, left foot trace withdraw on pain stimulation, RUE has spontaneous movement, on sling, localizing to pain, RLE BKA, no movement on pain stimulation. DTR 1+ and positive babinski on the left.   ASSESSMENT/Grant Judy Grant is a 81 y.o. female with history of HTN, HLD, valvular afib on coumadin and lovenox, CHF, NSTEMI, stoke with right side residue had recent right BKA on 12/04/14 but found to have AMS and left sided weakness. MRI showed large right MCA infarct. With already limited QOL at home, combination of right LE BKA and left sided hemiplegia, pt will have very poor prognosis. Discussed with family at bedside and they are in agreement with  palliative care.  Stroke:  Right MCA large infarct embolic secondary to known afib   MRI  Right MCA large infarct  Carotid Doppler cancelled    2D Echo  EF 65-70%  lovenox for VTE prophylaxis  Diet - low sodium heart healthy  Diet NPO time specified   warfarin prior to admission, now on warfarin and lovenox  Discussed with family regarding the poor prognosis and family is in agreement for palliative care  RLE BKA  S/p BKA 12/04/14  Not moving RLE now  LUE fracture  In sling  Localizing to pain but limited movement  Palliative care  With RLE BKA, RUE fracture, LUE and LLE hemiplegia, she will have very poor prognosis  AMS  Advanced age  Discussed with family and they are in agreement with palliative care  Afib  Home meds couamdin  Due to procedure, she is now  on lovenox bridge   Recommend to d/c anticoagulation as pt likely to go to hospice  Hypertension  Home meds:   metoprolol Permissive hypertension (OK if <220/120) for 24-48 hours post stroke and then gradually normalized within 5-7 days. Currently on metoprolol  Stable  Consider palliative care  Hyperlipidemia  Home meds:  zocor   Currently on none  Consider palliative care  Other Stroke Risk Factors  Advanced age  Hx stroke/TIA  Coronary artery disease  Other Active Problems  Hyponatremia  Anemia  leukocytosis  Other Pertinent History  Palliative care recommended  Hospital day # 11   Neurology will sign off. Please call with questions. Thanks for the consult.  Judy Plan, MD PhD Stroke Neurology 12/09/2014 10:06 AM    To contact Stroke Continuity provider, please refer to [email or web address]. After hours, contact General Neurology

## 2014-12-09 NOTE — Progress Notes (Signed)
Pt HR 130s-150s. [facility] notified and new orders were given. Will continue to monitor.   Brienne Liguori, RN

## 2014-12-10 DIAGNOSIS — T8743 Infection of amputation stump, right lower extremity: Secondary | ICD-10-CM | POA: Insufficient documentation

## 2014-12-10 LAB — PROTIME-INR
INR: 1.76 — ABNORMAL HIGH (ref 0.00–1.49)
Prothrombin Time: 20.5 seconds — ABNORMAL HIGH (ref 11.6–15.2)

## 2014-12-10 MED ORDER — SCOPOLAMINE 1 MG/3DAYS TD PT72: 1.0000 | MEDICATED_PATCH | TRANSDERMAL | Status: AC

## 2014-12-10 MED ORDER — LORAZEPAM 2 MG/ML IJ SOLN: INTRAMUSCULAR | Status: AC

## 2014-12-10 MED ORDER — MORPHINE SULFATE (CONCENTRATE) 10 MG /0.5 ML PO SOLN: 5.0000 mg | ORAL | Status: AC | PRN

## 2014-12-10 MED ORDER — BISACODYL 10 MG RE SUPP: 10.0000 mg | Freq: Every day | RECTAL | Status: AC | PRN

## 2014-12-10 MED ORDER — ACETAMINOPHEN 650 MG RE SUPP: 650.0000 mg | Freq: Four times a day (QID) | RECTAL | Status: AC | PRN

## 2014-12-10 NOTE — Progress Notes (Signed)
Patient will discharge to [facility] Anticipated discharge date:12/10/14 Family notified:pt daughter Transportation by [facility]- scheduled for 12:30pm  CSW signing off.  Merlyn Lot, LCSWA Clinical Social Worker [phone]

## 2014-12-10 NOTE — Progress Notes (Signed)
Nutrition Brief Note  Chart reviewed. Pt has transitioned to comfort care with plans for residential hospice placement.  No further nutrition interventions warranted at this time.  Please re-consult as needed.   Joaquin Courts, RD, LDN, CNSC Pager [phone] After Hours Pager [phone]

## 2014-12-10 NOTE — Care Management Important Message (Signed)
Important Message  Patient Details  Name: Judy Grant MRN: [id number] Date of Birth: 31-Dec-1917   Medicare Important Message Given:  Yes-second notification given    Kyla Balzarine 12/10/2014, 12:07 PM

## 2014-12-10 NOTE — Progress Notes (Signed)
Pt dc to [facility] via EMS, report called to the RN receiving the pt.

## 2014-12-10 NOTE — Discharge Summary (Signed)
Physician Discharge Summary  Judy Grant:[id number] DOB: 1918-01-21 DOA: 11/28/2014  PCP: [name], MD  Admit date: 11/28/2014 Discharge date: 12/10/2014  Time spent: 30 minutes  Recommendations for Outpatient Follow-up:  1. Full comfort care  Discharge Diagnoses:  Active Problems:   Paroxysmal atrial fibrillation   Chronic kidney disease   Long term current use of anticoagulant therapy   Gangrene of foot   Cellulitis of right foot   Gangrene associated with type 2 diabetes mellitus   Physical deconditioning   Acute blood loss anemia   Cerebral embolism with cerebral infarction   Discharge Condition: stable and in no distress. VS WNL  Diet recommendation: comfort feeding  Filed Weights   11/28/14 2237  Weight: 58.7 kg (129 lb 6.6 oz)    History of present illness:  81 year old female who has a past medical history of Hyperlipidemia; Osteoporosis; Hypertension; Valvular heart disease; Paroxysmal atrial fibrillation; Diverticulosis; Degenerative joint disease; Chronic kidney disease; Anemia, iron deficiency; Popliteal cyst; Fracture of wrist (2008); Gout; Stroke (right side deficit); NSTEMI, initial episode of care (01/05/2012); and Chronic combined systolic and diastolic congestive heart failure (02/2013). Patient was sent to the ED from nursing home for worsening of right great toe pain along with black colored discoloration. Patient says that this has been going on for past few days, but recently has become worse. She endorses severe pain. Denies fever. Denies chest pain, no shortness of breath. No nausea vomiting or diarrhea. Admitted for further treatment of gangrene.   Hospital Course:  Acute right MCA large embolic infarct  -secondary to known A. Fib -at this moment no anticoagulation would be offered (inability to swallow and risk for conversion to hemorrhagic stroke)  -overall really poor prognosis -family in agreement with palliative/hospice  care -no artificial feeding -will transfer to [facility] for further comfort care.  -VSS, no acute distress. Anticipate less than 2 weeks of life expectancy   Sepsis due to cellulitis with Gangrene of foot: -no fever and no signs of systemic infection after amputation.  -Status post right transtibial amputation on 9/8 -continue comfort measures only -patient with large right MCA stroke and has remained essentially unresponsive and unable to follow commands or take anything by mouth -Plan of care is full comfort  Preop Evaluation/ EOL: - Due to 81 and multiple comorbidities, chronic kidney disease, atrial fibrillation on Coumadin, aortic stenosis  She is high risk for cardiopulmonary complications. Family and patient understand and they would like to proceed with surgery. -2-D echo demonstrating worsening on aortic stenosis in compare to echo in 2004; preserved EF, no wall motion abnormalities  -tolerated surgery without significant complication; but on 12/08/14 ended having acute right MCA stroke. Family not looking for invasive therapy and inclined to palliative/hospice care -will use PRN morphine, ativan and haldol -patient PRN dulcolax and also initiate use of scopolamine patch -will observe for stability in the next 24 hours and if stable will proceed to transfer to residential hospice for further comfort care  Long term current use of anticoagulant therapy/ Paroxysmal atrial fibrillation: -CHADS VASC2 score is 6. -given acute stroke and risk for conversion, plus inability to take anything by mouth, rec's has been for termination of anticoagulation   Chronic kidney disease stage 3-4: -Creatinine at 1.2-1.3 -essentially at baseline by GFR -will no check any further labs -Plan of care is full comfort care  Essential hypertension: -Continue metoprolol IV Q8 hours -mainly to control HR  Severe protein caloric malnutrition: -Comfort feeding -but patient unresponsive  Acute blood loss anemia -last Hgb 9.6 -will hold on checking any further blood work -plan of care is full comfort -patient received 1 unit of PRBC during this admission   Procedures:  S/p Right transtibial amputation on 12/04/14  Consultations:  Neurology  Vascular surgery  Orthopedic service  Discharge Exam: Filed Vitals:   12/09/14 1800  BP:   Pulse:   Temp:   Resp: 20   General: unable to follow commands and non-verbal. Patient with acute right side stroke on 12/08/14; no acute distress. increase EOL secretions appreciated on exam. Appears comfortable overall and stable VS.   HEENT: No bruits, no goiter, no JVD Heart: positive SEM, slightly tachycardic; A. Fib per telemetry  Lungs: Good air movement, no crackles, no wheezing Abdomen: Soft, nontender, nondistended, positive bowel sounds.  Neuro: Grossly intact, nonfocal. MSK: Status post right transtibial amputation. Dressings in place clean and dry. No swelling and no signs of cyanosis or necrosis.    Discharge Instructions   Discharge Instructions    Diet - low sodium heart healthy    Complete by:  As directed      Discharge instructions    Complete by:  As directed   Take medications as prescribed Please follow with Dr. Lajoyce Corners as instructed Physical therapy and rehab as per SNF protocol Check CBC in 5 days to follow Hgb trend Close follow up to coumadin level and discontinue lovenox bridge once therapeutic (2-3) Heart healthy diet Please noticed doxycycline only for 4 mors days to complete abx's therapy Repeat BMET in 5 days to follow electrolytes and renal function     Discharge instructions    Complete by:  As directed   Full comfort care Comfort feeding          Current Discharge Medication List    START taking these medications   Details  acetaminophen (TYLENOL) 650 MG suppository Place 1 suppository (650 mg total) rectally every 6 (six) hours as needed for mild pain (or Fever >/= 101). Qty:  12 suppository, Refills: 0    bisacodyl (DULCOLAX) 10 MG suppository Place 1 suppository (10 mg total) rectally daily as needed for moderate constipation. Qty: 12 suppository, Refills: 0    LORazepam (ATIVAN) 2 MG/ML injection To be given orally 0.5 mg to 1mg  as needed for agitation Qty: 1 mL, Refills: 0    Morphine Sulfate (MORPHINE CONCENTRATE) 10 mg / 0.5 ml concentrated solution Take 0.25 mLs (5 mg total) by mouth every 3 (three) hours as needed for severe pain or shortness of breath (agitation). Refills: 0    scopolamine (TRANSDERM-SCOP) 1 MG/3DAYS Place 1 patch (1.5 mg total) onto the skin every 3 (three) days. Qty: 10 patch, Refills: 12      STOP taking these medications     allopurinol (ZYLOPRIM) 100 MG tablet      Amino Acids-Protein Hydrolys (FEEDING SUPPLEMENT, PRO-STAT SUGAR FREE 64,) LIQD      atorvastatin (LIPITOR) 10 MG tablet      collagenase (SANTYL) ointment      furosemide (LASIX) 40 MG tablet      metoprolol (LOPRESSOR) 50 MG tablet      ondansetron (ZOFRAN) 4 MG tablet      pantoprazole (PROTONIX) 20 MG tablet      Wheat Dextrin (BENEFIBER PO)      acetaminophen (TYLENOL) 500 MG tablet      alendronate (FOSAMAX) 70 MG tablet      ASPIRIN LOW DOSE 81 MG EC tablet  benzonatate (TESSALON) 100 MG capsule      Calcium Carb-Cholecalciferol 500-400 MG-UNIT TABS      cefUROXime (CEFTIN) 250 MG tablet      COLACE 50 MG capsule      diclofenac sodium (VOLTAREN) 1 % GEL      feeding supplement, ENSURE COMPLETE, (ENSURE COMPLETE) LIQD      fluticasone (FLONASE) 50 MCG/ACT nasal spray      HYDROcodone-acetaminophen (NORCO/VICODIN) 5-325 MG per tablet      menthol-cetylpyridinium (CEPACOL) 3 MG lozenge      potassium chloride (K-DUR) 10 MEQ tablet      simvastatin (ZOCOR) 20 MG tablet      warfarin (COUMADIN) 1 MG tablet      warfarin (COUMADIN) 2.5 MG tablet        No Known Allergies    The results of significant diagnostics from  this hospitalization (including imaging, microbiology, ancillary and laboratory) are listed below for reference.    Significant Diagnostic Studies: Ct Head Wo Contrast  12/08/2014   CLINICAL DATA:  Acute altered mental status and left-sided weakness. 11/02/2014 and prior head CTs dating back to 01/05/2012  EXAM: CT HEAD WITHOUT CONTRAST  TECHNIQUE: Contiguous axial images were obtained from the base of the skull through the vertex without intravenous contrast.  COMPARISON:  11/02/2014 and prior exams  FINDINGS: Atrophy and chronic small-vessel white matter ischemic changes are again identified.  No acute intracranial abnormalities are identified, including mass lesion or mass effect, hydrocephalus, extra-axial fluid collection, midline shift, hemorrhage, or acute infarction.  The visualized bony calvarium is unremarkable.  IMPRESSION: No evidence of acute intracranial abnormality.  Atrophy and chronic small-vessel white matter ischemic changes.   Electronically Signed   By: Harmon Pier M.D.   On: 12/08/2014 15:08   Ct Angio Low Extrem Right W/cm &/or Wo/cm  11/30/2014   CLINICAL DATA:  81 year old female with diffuse infrainguinal arterial occlusive disease and active right foot cellulitis. Evaluate for possible endovascular intervention versus amputation.  EXAM: CT ANGIOGRAPHY OF THE RIGHT UPPEREXTREMITY  TECHNIQUE: Multidetector CT imaging of the right upper extremitywas performed using the standard protocol during bolus administration of intravenous contrast. Multiplanar CT image reconstructions and MIPs were obtained to evaluate the vascular anatomy.  CONTRAST:  80mL OMNIPAQUE IOHEXOL 350 MG/ML SOLN  COMPARISON:  None.  FINDINGS: Aorta:Minimal calcification in the visualized infrarenal abdominal aorta. No evidence of stenosis, aneurysm or other abnormality.  Inflow: Widely patent right common, internal and external iliac arteries. Minimal calcific plaque.  Outflow: The right common femoral artery is widely  patent and relatively disease free. The profunda femoral artery is widely patent. The superficial femoral artery demonstrates multifocal calcified atherosclerotic plaque but no significant narrowing through the level of the adductor canal. There is a focal high-grade stenosis of the most distal SFA/above the knee popliteal artery secondary to bulky calcified atherosclerotic plaque. The remainder the popliteal artery is moderately diseased with largely calcified atherosclerotic plaque.  Runoff: Suspect high-grade stenosis at the origin of the anterior tibial artery. The origin is not well seen. The anterior tibial artery then occludes in the proximal calf. The tibioperoneal trunk is occluded. Evaluation of reconstitution of the runoff vessels is limited secondary to relatively poor contrast opacification and significant vascular calcifications. The posterior tibial artery does appear to reconstitute above the ankle  Review of the MIP images confirms the above findings.  Surgical changes of prior right hip arthroplasty. No evidence of hardware complication. Streak artifact slightly limits evaluation of the upper thigh.  Degenerative osteoarthritic changes are present at the knee joint. Changes are most significant in the lateral compartment. Loose bodies are present within the lateral aspect of the suprapatellar recess. No focal osseous lesion.  IMPRESSION: VASCULAR  1. Significant focal distal femoropopliteal disease secondary to bulky calcified atherosclerotic plaque. This lesion may be amenable to a combination of orbital atherectomy and angioplasty or primary stenting with a high radial force stent such as Supera. 2. Severe runoff disease with complete occlusion of the tibioperoneal trunk and proximal occlusion of the anterior tibial artery. The posterior tibial artery does reconstitute above the ankle. Evaluation of the runoff vessels is somewhat limited by poor contrast opacification and artifact from calcified  atherosclerotic plaque. Catheter directed angiography may offer better anatomical evaluation. NON VASCULAR  1. Knee joint osteoarthritis most significant in the lateral compartment. 2. Loose bodies are noted within the lateral aspect of the suprapatellar recess.  Signed,  Sterling Big, MD  [facility]  [facility]   Electronically Signed   By: Malachy Moan M.D.   On: 11/30/2014 15:16   Mr Brain Wo Contrast  12/08/2014   CLINICAL DATA:  81 year old female with atrial fibrillation, high blood pressure and hyperlipidemia presenting with left-sided weakness and left-sided facial droop. Subsequent encounter.  EXAM: MRI HEAD WITHOUT CONTRAST  TECHNIQUE: Multiplanar, multiecho pulse sequences of the brain and surrounding structures were obtained without intravenous contrast.  COMPARISON:  12/08/2014 head CT.  01/06/2012 brain MR.  FINDINGS: Large nonhemorrhagic right hemispheric infarct involving the right middle cerebral artery distribution (right temporal lobe, right subinsular region, right operculum region, right frontal lobe, right parietal lobe and right corona radiata).  Thrombus suspected within the right middle cerebral artery proximal M2 branch.  Small vessel disease type changes.  Global atrophy without hydrocephalus.  No intracranial mass lesion noted on this unenhanced exam.  Cervical spondylotic changes with spinal stenosis and cord flattening C2-3 and C3-4 level.  Cervical medullary junction, pituitary region and pineal region unremarkable.  Mild exophthalmos.  IMPRESSION: Large nonhemorrhagic right hemispheric infarct involving the right middle cerebral artery distribution (right temporal lobe, right subinsular region, right operculum region, right frontal lobe, right parietal lobe and right corona radiata).  Thrombus suspected within the right middle cerebral artery proximal M2 branch.  Small vessel disease type changes.  Global atrophy without  hydrocephalus.  Cervical spondylotic changes with spinal stenosis and cord flattening C2-3 and C3-4 level.  These results will be called to the ordering clinician or representative by the Radiologist Assistant, and communication documented in the PACS or zVision Dashboard.   Electronically Signed   By: Lacy Duverney M.D.   On: 12/08/2014 20:38   Dg Chest Port 1 View  11/29/2014   CLINICAL DATA:  Sepsis. Possible necrotizing fasciitis involving the right great toe. Possible humerus fracture.  EXAM: PORTABLE CHEST - 1 VIEW  COMPARISON:  Right humerus radiographs -11/02/2014; chest radiograph - 10/25/2014; 02/11/2014; 02/03/2014  FINDINGS: Grossly unchanged enlarged cardiac silhouette and mediastinal contours with atherosclerotic plaque within the thoracic aorta. Unchanged retrocardiac opacity favored to represent a hiatal hernia. Mild pulmonary venous congestion without frank evidence of edema. No focal airspace opacities. No pleural effusion or pneumothorax. Unchanged bones including deformity involving the right humeral head as better depicted on right shoulder radiographs performed 11/02/2014  IMPRESSION: 1. Cardiomegaly without acute cardiopulmonary disease. 2. Suspected hiatal hernia. 3. Persistent deformity involving the right humeral head as better depicted on right shoulder radiographs performed 11/02/2014   Electronically Signed  By: Simonne Come M.D.   On: 11/29/2014 09:13   Dg Foot Complete Right  11/28/2014   CLINICAL DATA:  Right toe erythema and discoloration, with pain. Initial encounter.  EXAM: RIGHT FOOT COMPLETE - 3+ VIEW  COMPARISON:  None.  FINDINGS: Diffuse soft tissue air is noted tracking about the great toe, concerning for necrotizing fasciitis. Underlying lucency within the first distal phalanx raises concern for osteomyelitis. There may be osteomyelitis involving the distal aspect of the first proximal phalanx, though this is difficult to fully characterize on radiograph.  Diffuse  vascular calcifications are seen. Visualized joint spaces are grossly unremarkable.  IMPRESSION: 1. Diffuse soft tissue air tracks about the great toe, concerning for necrotizing fasciitis. Underlying lucency within the first distal phalanx raises concern for osteomyelitis. There may be osteomyelitis involving the distal aspect of the first proximal phalanx, though this is difficult to fully characterize on radiograph. 2. Diffuse vascular calcifications seen.  These results were called by telephone at the time of interpretation on 11/28/2014 at 7:02 pm to Dr. Bethann Berkshire, who verbally acknowledged these results.   Electronically Signed   By: Roanna Raider M.D.   On: 11/28/2014 19:06   Labs: Basic Metabolic Panel:  Recent Labs Lab 12/03/14 2045 12/04/14 0439 12/04/14 1916 12/08/14 0255  NA 132* 133* 134* 131*  K 4.3 4.4 4.4 4.8  CL 97* 99*  --  98*  CO2 23 26  --  27  GLUCOSE 165* 122* 106* 118*  BUN 13 11  --  16  CREATININE 0.88 0.80  --  0.87  CALCIUM 9.5 9.6  --  9.2   Liver Function Tests:  Recent Labs Lab 12/03/14 2045  AST 23  ALT 15  ALKPHOS 62  BILITOT 0.8  PROT 6.2*  ALBUMIN 2.4*   CBC:  Recent Labs Lab 12/03/14 2045  12/04/14 1915 12/04/14 1916 12/05/14 0347 12/06/14 0135 12/07/14 0450 12/08/14 0255  WBC 16.9*  < > 17.5*  --  13.1* 16.1* 14.5* 11.9*  NEUTROABS 14.8*  --   --   --   --   --   --   --   HGB 10.0*  < > 10.1* 11.2* 8.8* 8.8* 8.0* 9.6*  HCT 30.9*  < > 31.4* 33.0* 26.6* 27.3* 25.1* 30.8*  MCV 96.3  < > 96.0  --  96.7 97.5 99.6 95.1  PLT 289  < > 313  --  256 249 183 193  < > = values in this interval not displayed.  CBG:  Recent Labs Lab 12/08/14 1359  GLUCAP 123*    Signed:  Vassie Loll  [facility] 12/10/2014, 10:21 AM

## 2014-12-17 ENCOUNTER — Encounter: Payer: Self-pay | Admitting: Cardiovascular Disease

## 2014-12-27 DEATH — deceased

## 2017-01-10 IMAGING — CT CT CERVICAL SPINE W/O CM
2 of 3 series · 11 of 29 positions shown, 14 images · non-contrast
Comparison: 02/11/2014

CLINICAL DATA: Fall, right forehead injury, large hematoma, on
blood thinner

EXAM:
CT HEAD WITHOUT CONTRAST
CT CERVICAL SPINE WITHOUT CONTRAST
TECHNIQUE: Multidetector CT imaging of the head and cervical spine was
performed following the standard protocol without intravenous
contrast. Multiplanar CT image reconstructions of the cervical spine
were also generated.

[Series 5: cervical st 2.0 b31s · axial · 0.35mm/px · z∈[+104,+216]mm · 6 of 72 slices shown, 8 images]
[im 8/72  soft-tissue]
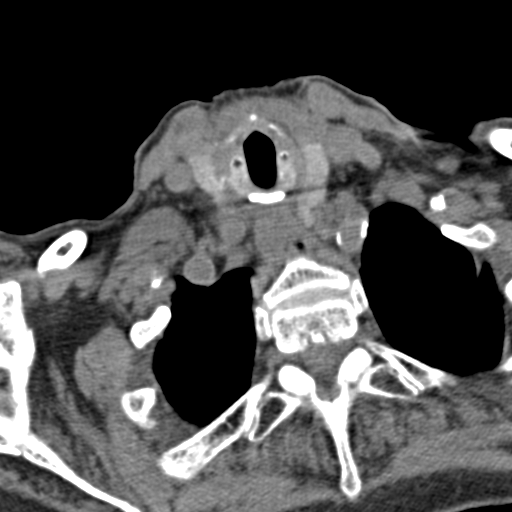
[im 8/72  bone]
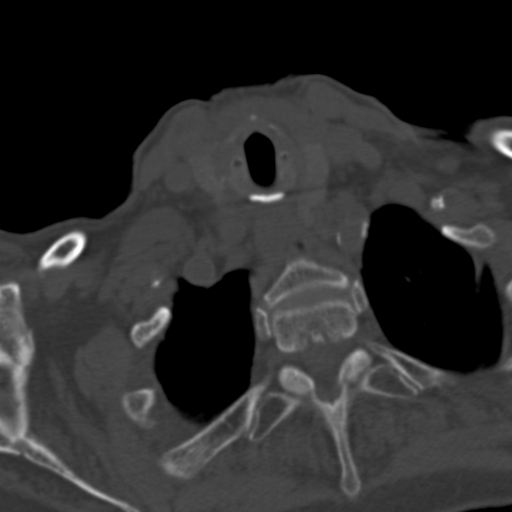
[im 24/72  bone]
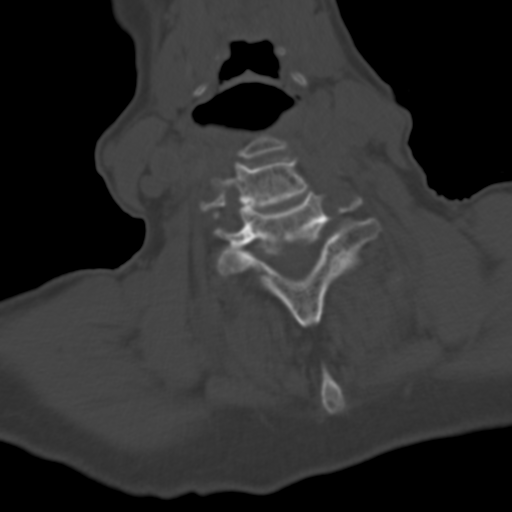
[im 32/72  bone]
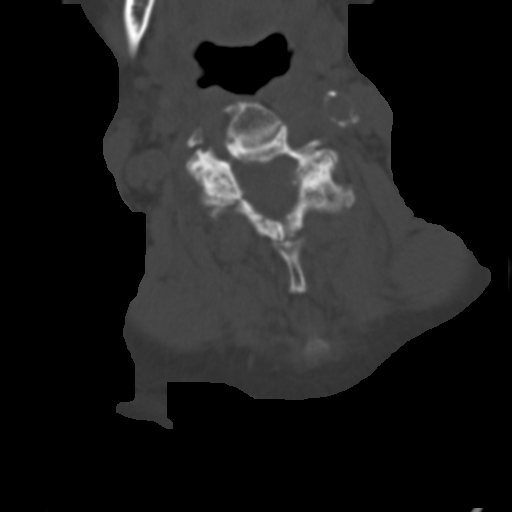
[im 40/72  bone]
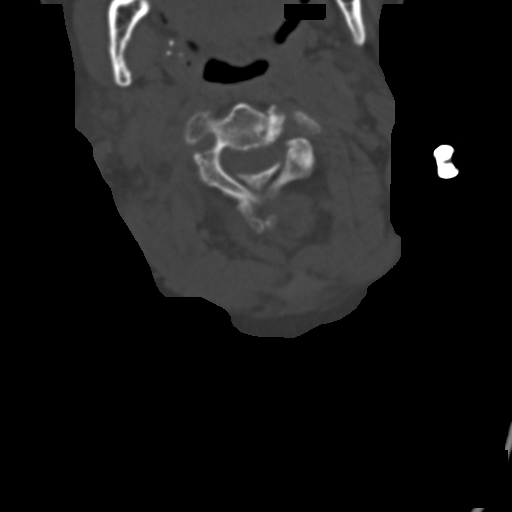
[im 56/72  soft-tissue]
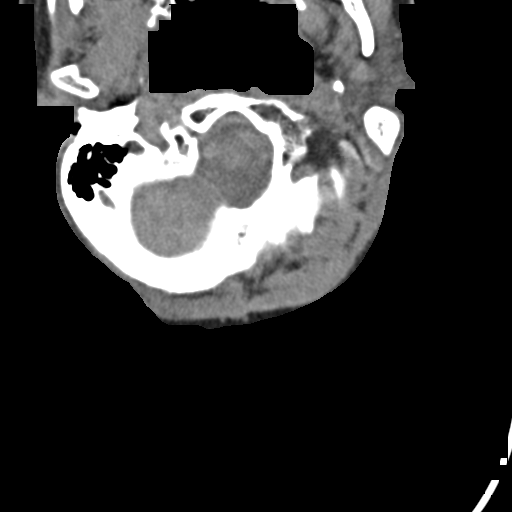
[im 56/72  bone]
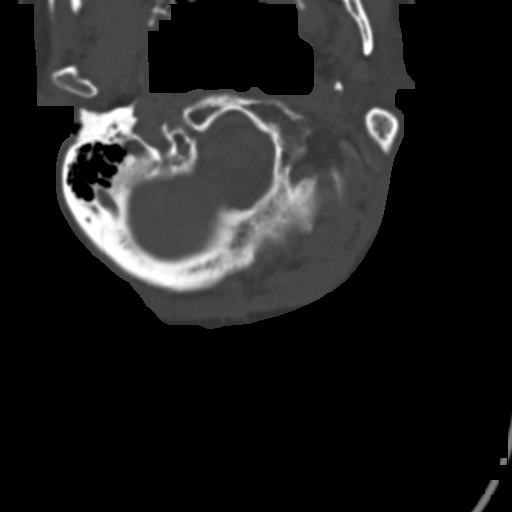
[im 64/72  bone]
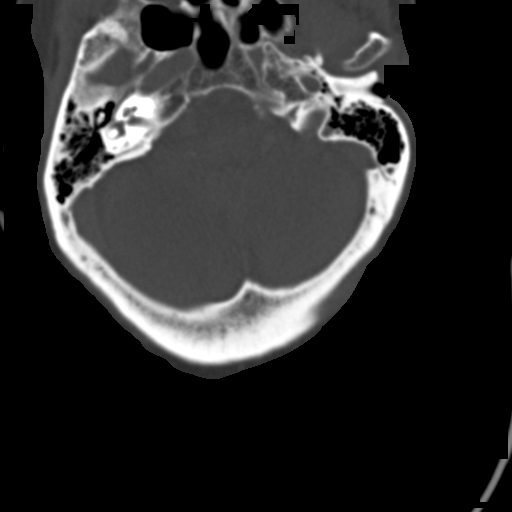

[Series 7: sagittal bone 2.0 · sagittal · 0.21mm/px · 5 of 59 slices shown, 6 images]
[im 20/59  bone]
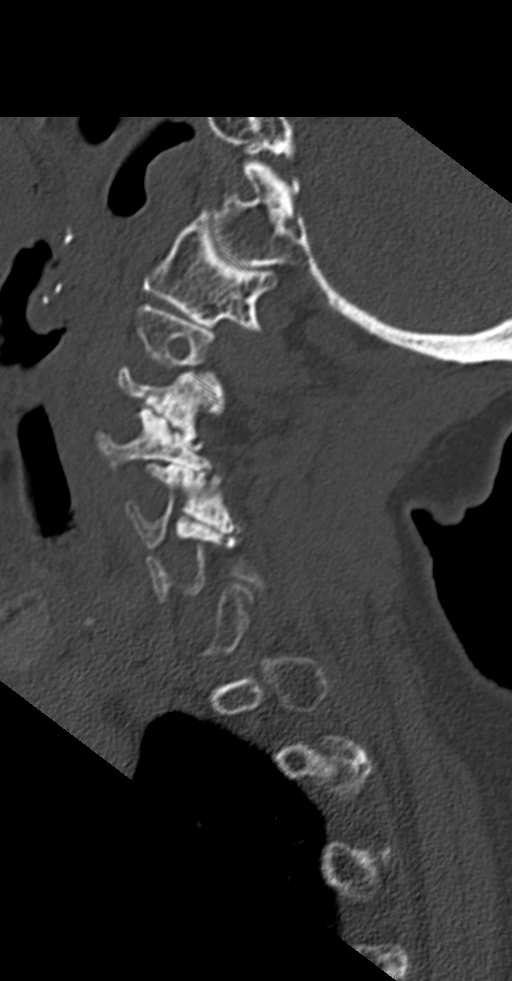
[im 25/59  bone]
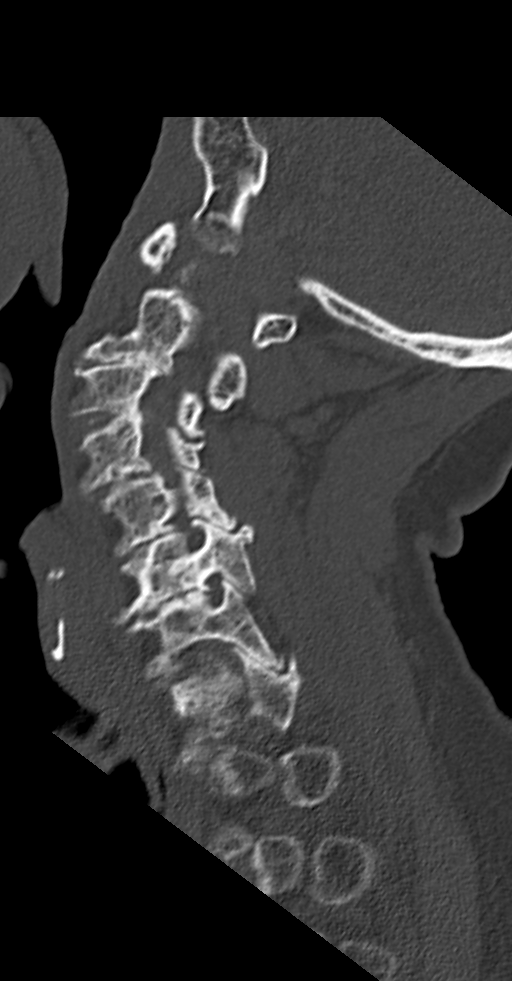
[im 30/59  soft-tissue]
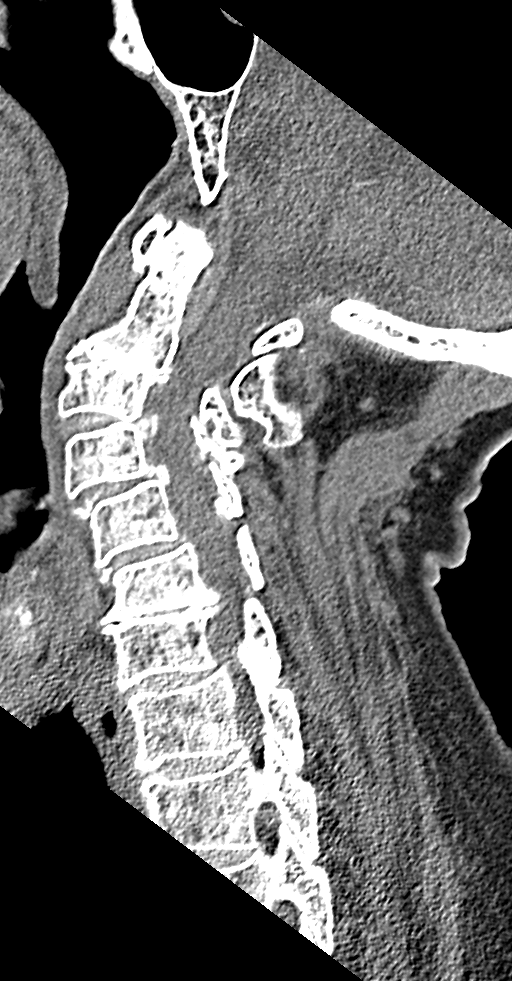
[im 30/59  bone]
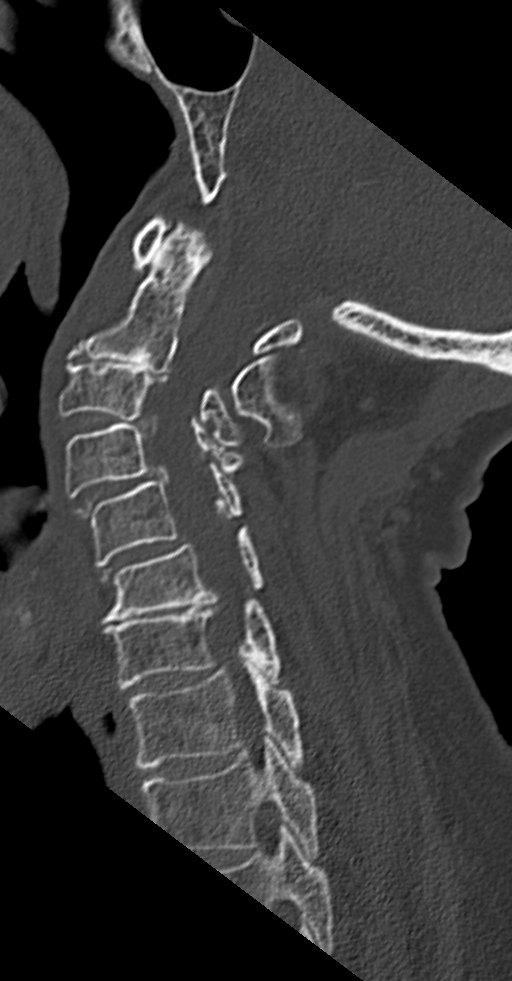
[im 34/59  bone]
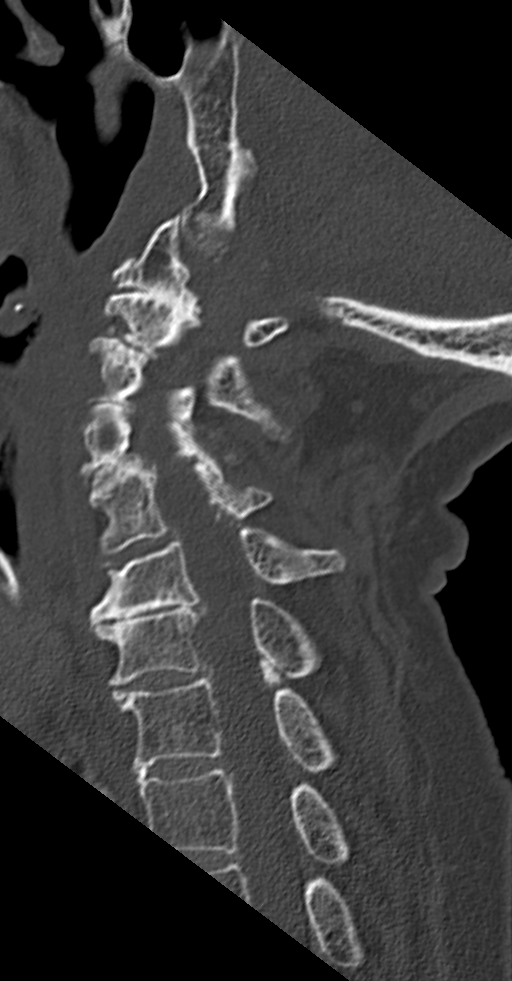
[im 39/59  bone]
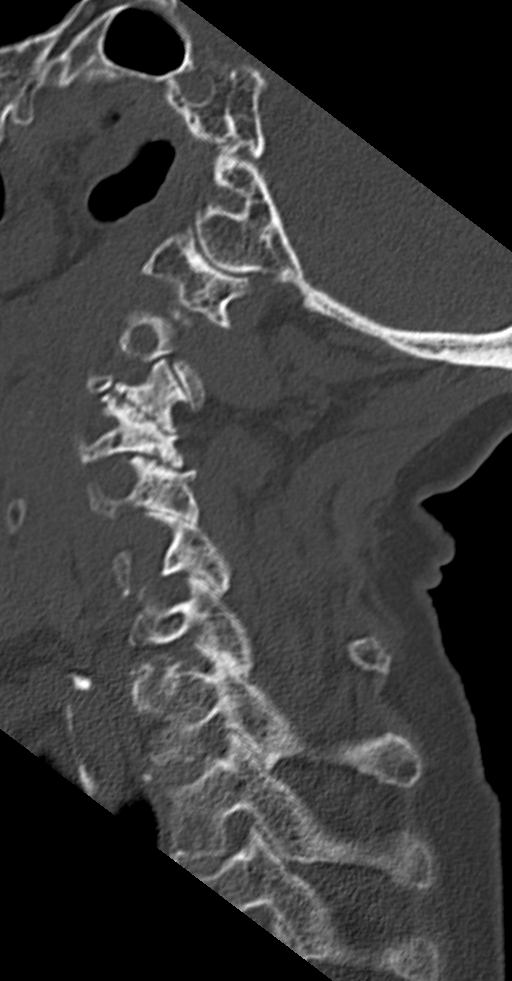

[11 of 29 positions shown; findings below may reference images not displayed]

FINDINGS: CT HEAD FINDINGS

No evidence of parenchymal hemorrhage or extra-axial fluid
collection. No mass lesion, mass effect, or midline shift.

No CT evidence of acute infarction.

Subcortical white matter and periventricular small vessel ischemic
changes. Intracranial atherosclerosis.

Global cortical and central atrophy. Secondary ventricular
prominence.

The visualized paranasal sinuses are essentially clear. The mastoid
air cells are unopacified.

Moderate extracranial hematoma overlying the right frontal bone
(series 2/image 17).

No evidence of calvarial fracture.

CT CERVICAL SPINE FINDINGS

Exaggerated upper cervical lordosis.

No evidence of fracture or dislocation. View body heights are
maintained. Dens appears intact.

No prevertebral soft tissue swelling.

Mild to moderate multilevel degenerative changes.

Visualized thyroid is grossly unremarkable.

Visualized lung apices are clear.
IMPRESSION: Moderate extracranial hematoma overlying the right frontal bone. No
evidence of calvarial fracture. No evidence of acute intracranial
abnormality.

No evidence of traumatic injury to the cervical spine. Mild to
moderate degenerative changes.

## 2017-01-10 IMAGING — DX DG SHOULDER 2+V*R*
2 series · 2 of 2 positions shown · non-contrast
Comparison: None.

CLINICAL DATA: Fell today.  Shoulder, elbow and wrist pain.

EXAM:
RIGHT SHOULDER - 2+ VIEW; RIGHT ELBOW - COMPLETE 3+ VIEW; RIGHT
WRIST - COMPLETE 3+ VIEW

[shoulder grashey]
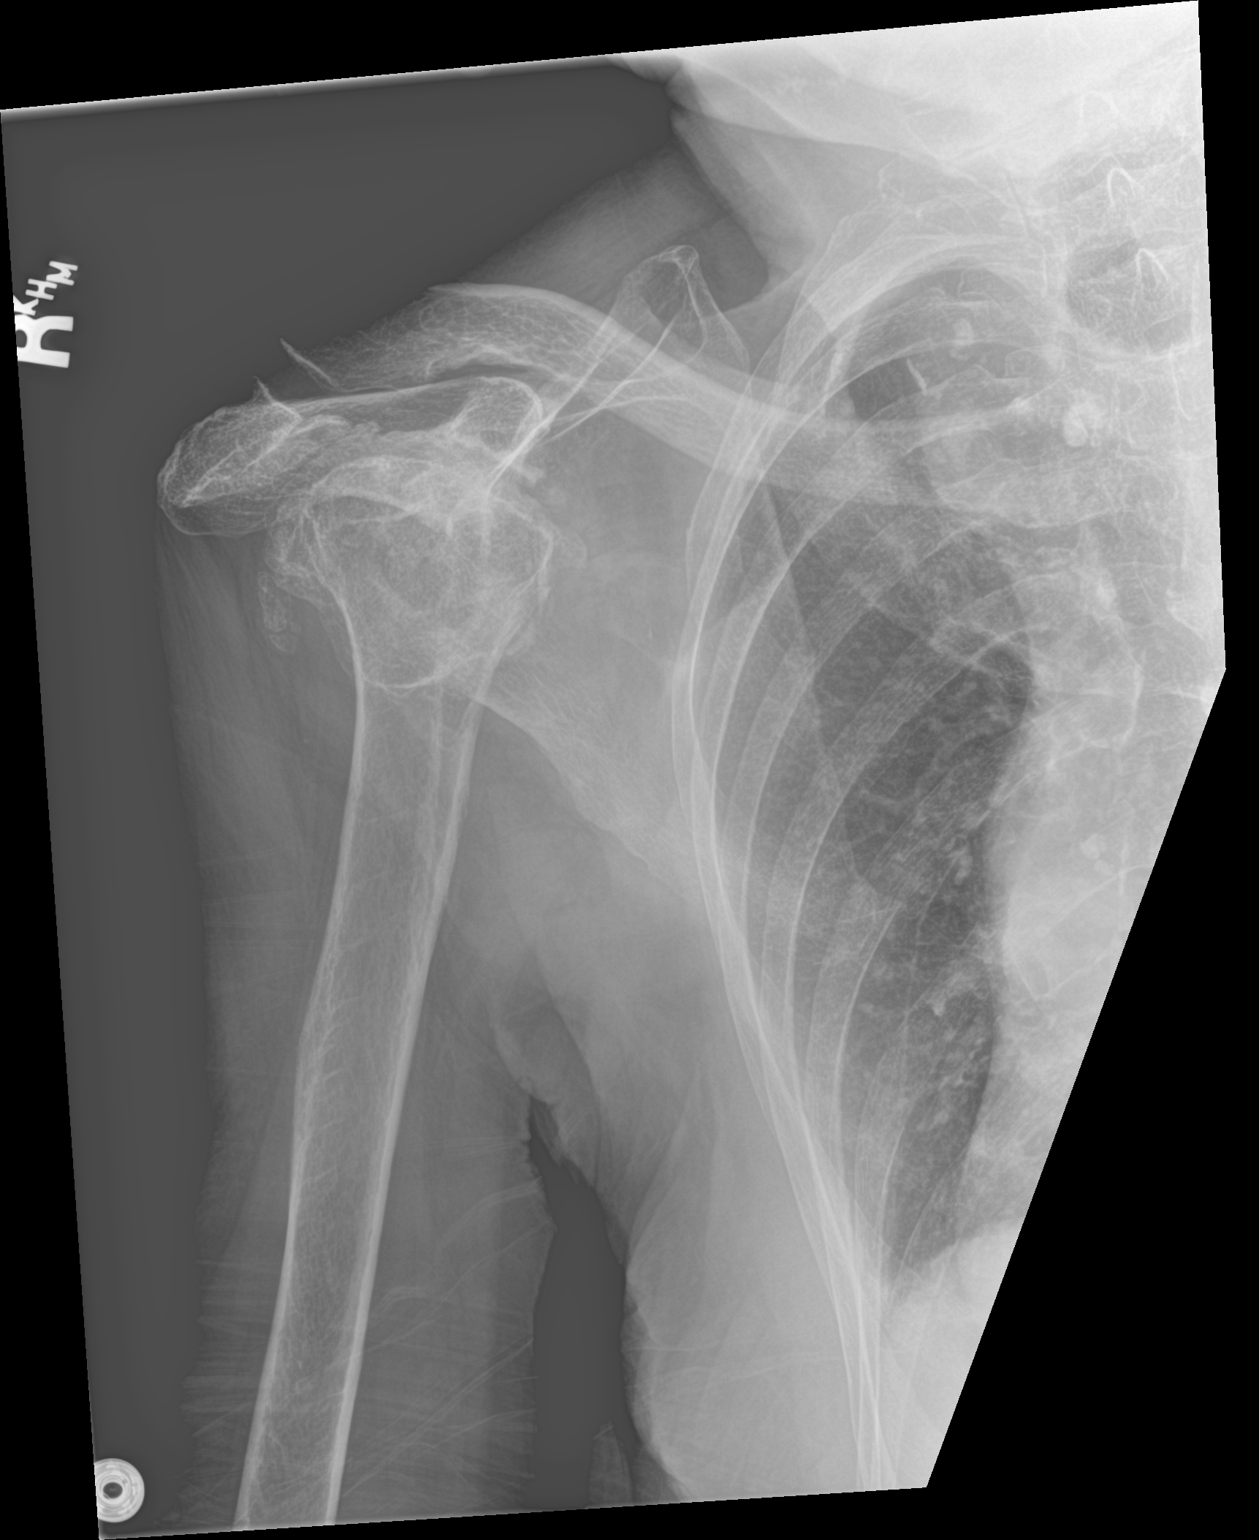

[shoulder y view]
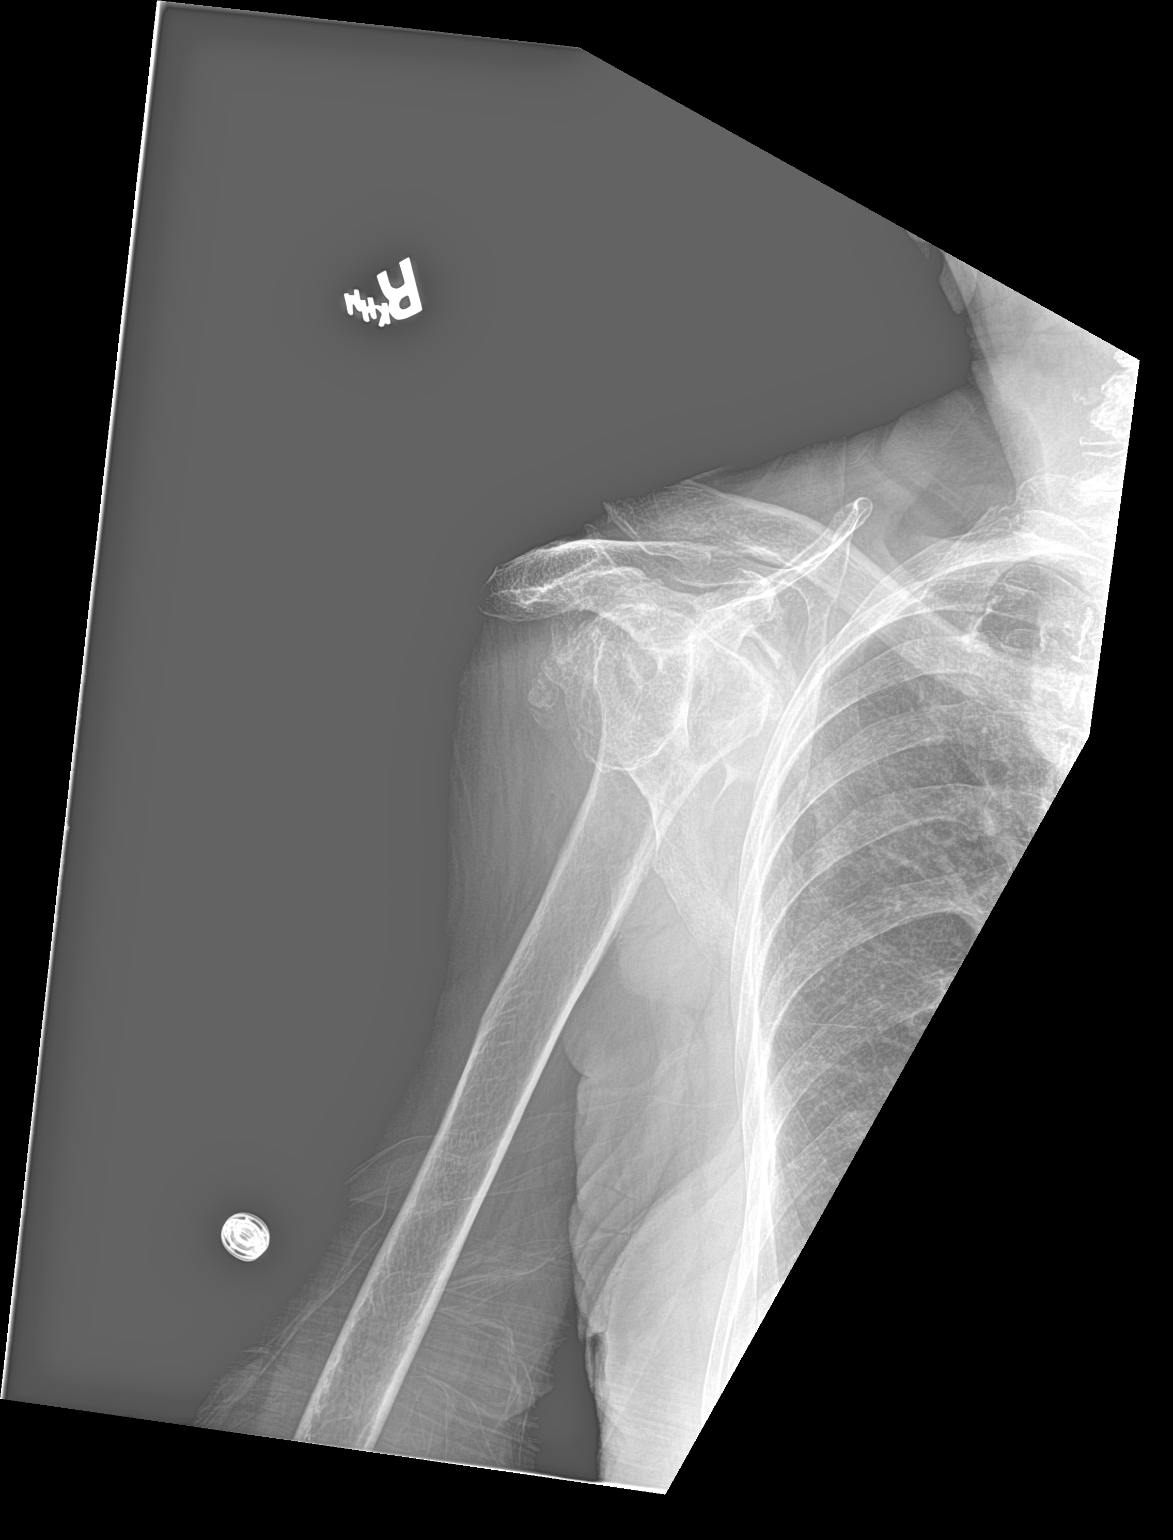

[2 of 2 positions shown; findings below may reference images not displayed]

FINDINGS: Right shoulder:

Severe chronic degenerative changes. Probable remote AVN and
probable remote fracture. Probable rotator cuff disease with
narrowing of the humeroacromial space. The AC joint is intact. The
visualize right ribs are intact.

Right elbow:

Limited examination due to positioning. Suspect a radial neck
fracture. Probable small joint effusion.

Right wrist:

Severe degenerative changes with carpal crowding and probable
erosions. Findings could be due to rheumatoid arthritis. No acute
wrist fracture is identified. There are extensive vascular changes.
IMPRESSION: 1. Severe chronic changes involving the right shoulder possibly due
to rheumatoid arthritis. Suspect remote AVN. No definite acute
shoulder fracture.
2. Suspect radial neck fracture at the elbow.
3. Advanced degenerative changes involving the wrist possibly due to
rheumatoid arthritis. No definite acute wrist fracture.
